# Patient Record
Sex: Male | Born: 1940 | Race: White | Hispanic: No | Marital: Married | Smoking: Former smoker
Health system: Southern US, Community
[De-identification: ages and names within clinical notes are randomized; demographics above are authoritative.]

## PROBLEM LIST (undated history)

## (undated) DIAGNOSIS — R011 Cardiac murmur, unspecified: Secondary | ICD-10-CM

## (undated) DIAGNOSIS — K8020 Calculus of gallbladder without cholecystitis without obstruction: Secondary | ICD-10-CM

## (undated) DIAGNOSIS — C4490 Unspecified malignant neoplasm of skin, unspecified: Secondary | ICD-10-CM

## (undated) DIAGNOSIS — K219 Gastro-esophageal reflux disease without esophagitis: Secondary | ICD-10-CM

## (undated) DIAGNOSIS — I480 Paroxysmal atrial fibrillation: Secondary | ICD-10-CM

## (undated) DIAGNOSIS — I10 Essential (primary) hypertension: Secondary | ICD-10-CM

## (undated) DIAGNOSIS — I639 Cerebral infarction, unspecified: Secondary | ICD-10-CM

## (undated) DIAGNOSIS — M545 Low back pain, unspecified: Secondary | ICD-10-CM

## (undated) DIAGNOSIS — I700 Atherosclerosis of aorta: Secondary | ICD-10-CM

## (undated) DIAGNOSIS — Z7901 Long term (current) use of anticoagulants: Secondary | ICD-10-CM

## (undated) DIAGNOSIS — M79605 Pain in left leg: Secondary | ICD-10-CM

## (undated) DIAGNOSIS — E785 Hyperlipidemia, unspecified: Secondary | ICD-10-CM

## (undated) DIAGNOSIS — Z8673 Personal history of transient ischemic attack (TIA), and cerebral infarction without residual deficits: Secondary | ICD-10-CM

## (undated) DIAGNOSIS — I447 Left bundle-branch block, unspecified: Secondary | ICD-10-CM

## (undated) DIAGNOSIS — M47812 Spondylosis without myelopathy or radiculopathy, cervical region: Secondary | ICD-10-CM

## (undated) DIAGNOSIS — Z8619 Personal history of other infectious and parasitic diseases: Secondary | ICD-10-CM

## (undated) DIAGNOSIS — I209 Angina pectoris, unspecified: Secondary | ICD-10-CM

## (undated) DIAGNOSIS — I5189 Other ill-defined heart diseases: Secondary | ICD-10-CM

## (undated) DIAGNOSIS — I350 Nonrheumatic aortic (valve) stenosis: Secondary | ICD-10-CM

## (undated) HISTORY — DX: Nonrheumatic aortic (valve) stenosis: I35.0

## (undated) HISTORY — DX: Hyperlipidemia, unspecified: E78.5

## (undated) HISTORY — DX: Personal history of other infectious and parasitic diseases: Z86.19

## (undated) HISTORY — DX: Cardiac murmur, unspecified: R01.1

## (undated) HISTORY — DX: Paroxysmal atrial fibrillation: I48.0

## (undated) HISTORY — DX: Essential (primary) hypertension: I10

---
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
## 2007-06-08 DIAGNOSIS — I10 Essential (primary) hypertension: Secondary | ICD-10-CM | POA: Insufficient documentation

## 2008-01-11 DIAGNOSIS — E7800 Pure hypercholesterolemia, unspecified: Secondary | ICD-10-CM | POA: Insufficient documentation

## 2009-03-15 ENCOUNTER — Ambulatory Visit: Payer: Self-pay | Admitting: Unknown Physician Specialty

## 2009-03-15 LAB — HM COLONOSCOPY

## 2011-10-21 ENCOUNTER — Encounter: Payer: Self-pay | Admitting: Cardiovascular Disease

## 2011-10-21 ENCOUNTER — Ambulatory Visit (INDEPENDENT_AMBULATORY_CARE_PROVIDER_SITE_OTHER): Payer: Medicare Other | Admitting: Cardiovascular Disease

## 2011-10-21 VITALS — BP 123/88 | HR 99 | Ht 65.0 in | Wt 187.2 lb

## 2011-10-21 DIAGNOSIS — R55 Syncope and collapse: Secondary | ICD-10-CM | POA: Insufficient documentation

## 2011-10-21 DIAGNOSIS — I10 Essential (primary) hypertension: Secondary | ICD-10-CM | POA: Insufficient documentation

## 2011-10-21 NOTE — Progress Notes (Signed)
Patient ID: Jon Mejia, male    DOB: 11-15-1940, 71 y.o.   MRN: [id number]  HPI Comments: Mr. Albracht is a very pleasant 71 year old gentleman with no prior cardiac history who presents by referral from Dr. Sherrie Mustache for evaluation after syncope.  He reports that he is having dizzy episodes with standing over the past several weeks to months. He had a severe episode 09/27/2011 where he passed out for about a minute. 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 Symptoms persisted and he found himself lying on the ground.   No further episodes since that time. He has significant low blood pressure in the past and cut his lisinopril HCT in half. Daughter who is an EMT reports he was apparently having systolic pressures in the 90s. He is otherwise active, plays golf with no limitations. No shortness of breath with exertion. No chest pain with activity.  Recent lab work including CBC, LFTs, basic metabolic panel, TSH were essentially normal EKG done at an outside office shows normal sinus rhythm with rate 72 beats per minute with no significant ST or T wave changes  Orthostatics done in our office showed an increase in heart rate from 79-99 with standing, blood pressure decreased from 140 to systolic to 123. No symptoms of dizziness   Outpatient Encounter Prescriptions as of 10/21/2011  Medication Sig Dispense Refill  . aspirin 81 MG tablet Take 81 mg by mouth daily.      Marland Kitchen lisinopril-hydrochlorothiazide (PRINZIDE,ZESTORETIC) 10-12.5 MG per tablet Take 1/2 tablet daily      . lovastatin (MEVACOR) 20 MG tablet Take 20 mg by mouth at bedtime.       Review of Systems  Constitutional: Negative.   HENT: Negative.   Eyes: Negative.   Respiratory: Negative.   Cardiovascular: Negative.   Gastrointestinal: Negative.   Musculoskeletal: Negative.   Skin:  Negative.   Neurological: Positive for dizziness and syncope.  Hematological: Negative.   Psychiatric/Behavioral: Negative.   All other systems reviewed and are negative.    BP 123/88  Pulse 99  Ht 5\' 5"  (1.651 m)  Wt 187 lb 4 oz (84.936 kg)  BMI 31.16 kg/m2  Physical Exam  Nursing note and vitals reviewed. Constitutional: He is oriented to person, place, and time. He appears well-developed and well-nourished.  HENT:  Head: Normocephalic.  Nose: Nose normal.  Mouth/Throat: Oropharynx is clear and moist.  Eyes: Conjunctivae normal are normal. Pupils are equal, round, and reactive to light.  Neck: Normal range of motion. Neck supple. No JVD present.  Cardiovascular: Normal rate, regular rhythm, S1 normal, S2 normal, normal heart sounds and intact distal pulses.  Exam reveals no gallop and no friction rub.   No murmur heard. Pulmonary/Chest: Effort normal and breath sounds normal. No respiratory distress. He has no wheezes. He has no rales. He exhibits no tenderness.  Abdominal: Soft. Bowel sounds are normal. He exhibits no distension. There is no tenderness.  Musculoskeletal: Normal range of motion. He exhibits no edema and no tenderness.  Lymphadenopathy:    He has no cervical adenopathy.  Neurological: He is alert and oriented to person, place, and time. Coordination normal.  Skin: Skin is warm and dry. No rash noted. No erythema.  Psychiatric: He has a normal mood and affect. His behavior is normal. Judgment and thought content normal.  Assessment and Plan

## 2011-10-21 NOTE — Assessment & Plan Note (Signed)
Syncope concerning for orthostasis. There appeared to be a positional component in his symptoms. We have suggested before we do any testing that he hold his lisinopril HCT. His numbers do suggest mild orthostasis on today's visit even on one half pill. We've asked him to monitor his blood pressure and symptoms. If he has additional dizziness, we have suggested he aggressively hydrate. I suggested he call our office for additional symptoms. Possible testing includes Holter monitor, additional workup also includes echocardiogram or stress test though he does not have symptoms with exertion.

## 2011-10-21 NOTE — Assessment & Plan Note (Signed)
His daughter reports low blood pressure at baseline. He is mildly orthostatic on today's visit. We have suggested he hold his blood pressure pill, monitor his blood pressure at home and call our office for systolic pressures running in the 140 range. We will start low-dose lisinopril such as 5 mg daily with slow titration upwards, holding the diuretic.

## 2011-10-21 NOTE — Patient Instructions (Addendum)
You are doing well. Please hold the lisinopril HCTZ  Please call the office if you have more episodes of dizziness Please monitor your blood pressure at home and call with the numbers  Please call us if you have new issues that need to be addressed before your next appt.

## 2012-03-14 ENCOUNTER — Other Ambulatory Visit: Payer: Self-pay

## 2012-04-01 ENCOUNTER — Ambulatory Visit: Payer: Self-pay | Admitting: Family Medicine

## 2012-09-02 ENCOUNTER — Other Ambulatory Visit: Payer: Self-pay

## 2012-12-03 ENCOUNTER — Other Ambulatory Visit: Payer: Self-pay

## 2014-01-17 LAB — LIPID PANEL
Cholesterol: 154 mg/dL (ref 0–200)
HDL: 44 mg/dL (ref 35–70)
LDL CALC: 88 mg/dL
Triglycerides: 109 mg/dL (ref 40–160)

## 2014-01-17 LAB — BASIC METABOLIC PANEL
BUN: 12 mg/dL (ref 4–21)
Creatinine: 1.1 mg/dL (ref 0.6–1.3)
GLUCOSE: 107 mg/dL
POTASSIUM: 5.2 mmol/L (ref 3.4–5.3)
SODIUM: 140 mmol/L (ref 137–147)

## 2014-01-17 LAB — PSA: PSA: 2.2

## 2014-01-17 LAB — HEPATIC FUNCTION PANEL: ALT: 19 U/L (ref 10–40)

## 2014-03-15 DIAGNOSIS — M5416 Radiculopathy, lumbar region: Secondary | ICD-10-CM | POA: Diagnosis not present

## 2014-03-22 DIAGNOSIS — M545 Low back pain: Secondary | ICD-10-CM | POA: Diagnosis not present

## 2014-04-04 DIAGNOSIS — M5416 Radiculopathy, lumbar region: Secondary | ICD-10-CM | POA: Diagnosis not present

## 2014-04-06 DIAGNOSIS — M545 Low back pain: Secondary | ICD-10-CM | POA: Diagnosis not present

## 2014-04-08 DIAGNOSIS — M545 Low back pain: Secondary | ICD-10-CM | POA: Diagnosis not present

## 2014-04-12 DIAGNOSIS — M545 Low back pain: Secondary | ICD-10-CM | POA: Diagnosis not present

## 2014-04-15 DIAGNOSIS — M545 Low back pain: Secondary | ICD-10-CM | POA: Diagnosis not present

## 2014-04-18 DIAGNOSIS — M545 Low back pain: Secondary | ICD-10-CM | POA: Diagnosis not present

## 2014-04-20 DIAGNOSIS — M545 Low back pain: Secondary | ICD-10-CM | POA: Diagnosis not present

## 2014-05-06 DIAGNOSIS — M545 Low back pain: Secondary | ICD-10-CM | POA: Diagnosis not present

## 2014-05-09 DIAGNOSIS — M47816 Spondylosis without myelopathy or radiculopathy, lumbar region: Secondary | ICD-10-CM | POA: Diagnosis not present

## 2014-05-26 DIAGNOSIS — M47816 Spondylosis without myelopathy or radiculopathy, lumbar region: Secondary | ICD-10-CM | POA: Diagnosis not present

## 2014-06-22 DIAGNOSIS — M47816 Spondylosis without myelopathy or radiculopathy, lumbar region: Secondary | ICD-10-CM | POA: Diagnosis not present

## 2015-01-01 ENCOUNTER — Other Ambulatory Visit: Payer: Self-pay | Admitting: Family Medicine

## 2015-01-19 ENCOUNTER — Encounter: Payer: Self-pay | Admitting: Family Medicine

## 2015-01-19 DIAGNOSIS — M5410 Radiculopathy, site unspecified: Secondary | ICD-10-CM | POA: Insufficient documentation

## 2015-01-24 ENCOUNTER — Encounter: Payer: Self-pay | Admitting: Family Medicine

## 2015-01-24 ENCOUNTER — Ambulatory Visit (INDEPENDENT_AMBULATORY_CARE_PROVIDER_SITE_OTHER): Payer: Medicare Other | Admitting: Family Medicine

## 2015-01-24 VITALS — BP 130/70 | HR 74 | Temp 98.4°F | Resp 16 | Ht 66.0 in | Wt 197.0 lb

## 2015-01-24 DIAGNOSIS — I10 Essential (primary) hypertension: Secondary | ICD-10-CM

## 2015-01-24 DIAGNOSIS — Z125 Encounter for screening for malignant neoplasm of prostate: Secondary | ICD-10-CM | POA: Diagnosis not present

## 2015-01-24 DIAGNOSIS — Z23 Encounter for immunization: Secondary | ICD-10-CM

## 2015-01-24 DIAGNOSIS — R0609 Other forms of dyspnea: Secondary | ICD-10-CM

## 2015-01-24 DIAGNOSIS — Z0000 Encounter for general adult medical examination without abnormal findings: Secondary | ICD-10-CM | POA: Diagnosis not present

## 2015-01-24 DIAGNOSIS — M5410 Radiculopathy, site unspecified: Secondary | ICD-10-CM

## 2015-01-24 DIAGNOSIS — E7800 Pure hypercholesterolemia, unspecified: Secondary | ICD-10-CM

## 2015-01-24 NOTE — Progress Notes (Signed)
Patient: Jon Mejia, Male    DOB: 04/09/1940, 74 y.o.   MRN: [id number] Visit Date: 01/24/2015  Today's Provider: [name], MD   Chief Complaint  Patient presents with  . Annual Exam  . Hypertension  . Hyperlipidemia  . Back Pain   Subjective:    Annual physical  Jon Mejia is a 74 y.o. male. He feels well. He reports exercising yes/walking. He reports he is sleeping poorly.  -----------------------------------------------------------   Follow-up for back pain from 01/18/2015; restarted Meloxicam 15 mg qd. Is being followed by at [facility] and has had several steroid injections which he has found to help. He continues meloxicam prn which he states is very effective.    ----------------------------------------------------------------------  Hypertension, follow-up:  BP Readings from Last 3 Encounters:  01/24/15 130/70  01/17/14 140/80  10/21/11 123/88    He was last seen for hypertension 1 years ago.  BP at that visit was 140/80. Management since that visit includes; discontinued lisinopril-hctz in 2013 due to dizziness.He reports good compliance with treatment. Has had no problems with dizziness since changing BP medications.  He is not having side effects. none  He is exercising. He is adherent to low salt diet.   Outside blood pressures are 140/80. He is experiencing none.  Patient denies none.   Cardiovascular risk factors include none.  Use of agents associated with hypertension: none.   ------------------------------------------------------------------------   Lipid/Cholesterol, Follow-up:   Last seen for this 1 years ago.  Management since that visit includes; no changes.  Last Lipid Panel:    Component Value Date/Time   CHOL 154 01/17/2014   TRIG 109 01/17/2014   HDL 44 01/17/2014   LDLCALC 88 01/17/2014    He reports good compliance with treatment. He is not having side effects. none  Wt Readings from  Last 3 Encounters:  01/24/15 197 lb (89.359 kg)  01/17/14 194 lb (87.998 kg)  10/21/11 187 lb 4 oz (84.936 kg)    ----------------------------------------------------------------------     Review of Systems  Constitutional: Positive for fatigue.  HENT: Negative.   Eyes: Negative.   Respiratory: Positive for shortness of breath.   Cardiovascular: Negative.   Gastrointestinal: Negative.   Endocrine: Negative.   Musculoskeletal: Positive for back pain.  Skin: Negative.   Allergic/Immunologic: Negative.   Neurological: Negative.   Hematological: Negative.   Psychiatric/Behavioral: Positive for sleep disturbance.       Has trouble staying asleep    Social History   Social History  . Marital Status: Married    Spouse Name: N/A  . Number of Children: N/A  . Years of Education: N/A   Occupational History  . Not on file.   Social History Main Topics  . Smoking status: Former Smoker -- 1.00 packs/day for 20 years    Quit date: 03/12/1983  . Smokeless tobacco: Not on file  . Alcohol Use: No  . Drug Use: No  . Sexual Activity: Not on file   Other Topics Concern  . Not on file   Social History Narrative    Patient Active Problem List   Diagnosis Date Noted  . Back pain with left-sided radiculopathy 01/19/2015  . Essential hypertension, malignant 10/21/2011  . Syncope 10/21/2011  . Pure hypercholesterolemia 01/11/2008  . Essential (primary) hypertension 06/08/2007  . Premature atrial beats 01/05/2007    History reviewed. No pertinent past surgical history.  His family history includes Arrhythmia in his mother. There is no history of  Colon cancer or Prostate cancer.    Previous Medications   AMLODIPINE (NORVASC) 5 MG TABLET    take 1 tablet by mouth once daily   ASPIRIN 81 MG TABLET    Take 81 mg by mouth daily.   LOVASTATIN (MEVACOR) 20 MG TABLET    Take 20 mg by mouth at bedtime.   MELOXICAM (MOBIC) 15 MG TABLET    Take 1 tablet by mouth daily as needed. For  back pain    Patient Care Team: [name], MD as PCP - General (Family Medicine)     Objective:   Vitals: BP 130/70 mmHg  Pulse 74  Temp(Src) 98.4 F (36.9 C) (Oral)  Resp 16  Ht 5\' 6"  (1.676 m)  Wt 197 lb (89.359 kg)  BMI 31.81 kg/m2  SpO2 96%  Physical Exam   General Appearance:    Alert, cooperative, no distress, appears stated age  Head:    Normocephalic, without obvious abnormality, atraumatic  Eyes:    PERRL, conjunctiva/corneas clear, EOM's intact, fundi    benign, both eyes       Ears:    Normal TM's and external ear canals, both ears  Nose:   Nares normal, septum midline, mucosa normal, no drainage   or sinus tenderness  Throat:   Lips, mucosa, and tongue normal; teeth and gums normal  Neck:   Supple, symmetrical, trachea midline, no adenopathy;       thyroid:  No enlargement/tenderness/nodules; no carotid   bruit or JVD  Back:     Symmetric, no curvature, ROM normal, no CVA tenderness  Lungs:     Clear to auscultation bilaterally, respirations unlabored  Chest wall:    No tenderness or deformity  Heart:    Regular rate and rhythm, S1 and S2 normal. III/VI systolic murmur RUSB  Abdomen:     Soft, non-tender, bowel sounds active all four quadrants,    no masses, no organomegaly  Genitalia:    deferred  Rectal:    deferred  Extremities:   Extremities normal, atraumatic, no cyanosis or edema  Pulses:   2+ and symmetric all extremities  Skin:   Skin color, texture, turgor normal, no rashes or lesions  Lymph nodes:   Cervical, supraclavicular, and axillary nodes normal  Neurologic:   CNII-XII intact. Normal strength, sensation and reflexes      throughout    Activities of Daily Living In your present state of health, do you have any difficulty performing the following activities: 01/24/2015  Hearing? N  Vision? N  Difficulty concentrating or making decisions? N  Walking or climbing stairs? N  Dressing or bathing? N  Doing errands, shopping? N     Fall Risk Assessment Fall Risk  01/24/2015  Falls in the past year? No     Depression Screen PHQ 2/9 Scores 01/24/2015  PHQ - 2 Score 0    Cognitive Testing - 6-CIT  Correct? Score   What year is it? yes 0 0 or 4  What month is it? yes 0 0 or 3  Memorize:    Pia Mau,  [birth year],  [place],      What time is it? (within 1 hour) yes 0 0 or 3  Count backwards from 20 yes 0 0, 2, or 4  Name the months of the year yes 0 0, 2, or 4  Repeat name & address above yes 0 0, 2, 4, 6, 8, or 10  TOTAL SCORE  0/28   Interpretation:  Normal  Normal (0-7) Abnormal (8-28)    Audit-C Alcohol Use Screening  Question Answer Points  How often do you have alcoholic drink? never 0  On days you do drink alcohol, how many drinks do you typically consume? n/a 0  How oftey will you drink 6 or more in a total? never 0  Total Score:  0   A score of 3 or more in women, and 4 or more in men indicates increased risk for alcohol abuse, EXCEPT if all of the points are from question 1.     Assessment & Plan:    Yearly physical Reviewed patient's Family Medical History Reviewed and updated list of patient's medical providers Assessment of cognitive impairment was done Assessed patient's functional ability Established a written schedule for health screening [place] Completed and Reviewed  Exercise Activities and Dietary recommendations Goals    None      Immunization History  Administered Date(s) Administered  . Pneumococcal Conjugate-13 07/12/2013  . Pneumococcal Polysaccharide-23 01/07/2008  . Td 12/15/2003    Health Maintenance  Topic Date Due  . ZOSTAVAX  08/16/2002  . TETANUS/TDAP  12/14/2013  . INFLUENZA VACCINE  08/29/2014  . COLONOSCOPY  03/16/2019  . PNA vac Low Risk Adult  Completed      Discussed health benefits of physical activity, and encouraged him to engage in regular exercise appropriate for his age and condition.     --------------------------------------------------------------------------------  1. Annual physical exam   2. Pure hypercholesterolemia He is tolerating lovastatin well with no adverse effects.   - ALT - Lipid panel  3. Back pain with left-sided radiculopathy Fairly well controlled on prn meloxicam and responded well to epidural injections at [facility].   4. Essential (primary) hypertension Fairly well controlled.  Continue current medications.   - EKG 12-Lead - Renal function panel  5. Dyspnea on exertion New symptom. In setting of systolic heart murmur need to evaluation integrity of valces and cardiac function - CBC - Echocardiogram; Future  6. Need for influenza vaccination  - Flu Vaccine QUAD 36+ mos IM  7. Prostate cancer screening  - PSA

## 2015-01-25 LAB — RENAL FUNCTION PANEL
Albumin: 4.4 g/dL (ref 3.5–4.8)
BUN / CREAT RATIO: 15 (ref 10–22)
BUN: 14 mg/dL (ref 8–27)
CO2: 21 mmol/L (ref 18–29)
CREATININE: 0.95 mg/dL (ref 0.76–1.27)
Calcium: 9.3 mg/dL (ref 8.6–10.2)
Chloride: 102 mmol/L (ref 96–106)
GFR, EST AFRICAN AMERICAN: 92 mL/min/{1.73_m2} (ref >59)
GFR, EST NON AFRICAN AMERICAN: 80 mL/min/{1.73_m2} (ref >59)
GLUCOSE: 98 mg/dL (ref 65–99)
PHOSPHORUS: 3 mg/dL (ref 2.5–4.5)
POTASSIUM: 4.7 mmol/L (ref 3.5–5.2)
SODIUM: 141 mmol/L (ref 134–144)

## 2015-01-25 LAB — CBC
HEMATOCRIT: 45.1 % (ref 37.5–51.0)
Hemoglobin: 15 g/dL (ref 12.6–17.7)
MCH: 30.1 pg (ref 26.6–33.0)
MCHC: 33.3 g/dL (ref 31.5–35.7)
MCV: 91 fL (ref 79–97)
PLATELETS: 299 10*3/uL (ref 150–379)
RBC: 4.98 x10E6/uL (ref 4.14–5.80)
RDW: 13.9 % (ref 12.3–15.4)
WBC: 7.1 10*3/uL (ref 3.4–10.8)

## 2015-01-25 LAB — LIPID PANEL
CHOL/HDL RATIO: 3.7 ratio (ref 0.0–5.0)
Cholesterol, Total: 145 mg/dL (ref 100–199)
HDL: 39 mg/dL — ABNORMAL LOW (ref >39)
LDL CALC: 83 mg/dL (ref 0–99)
TRIGLYCERIDES: 116 mg/dL (ref 0–149)
VLDL Cholesterol Cal: 23 mg/dL (ref 5–40)

## 2015-01-25 LAB — ALT: ALT: 15 IU/L (ref 0–44)

## 2015-01-25 LAB — PSA: PROSTATE SPECIFIC AG, SERUM: 2.7 ng/mL (ref 0.0–4.0)

## 2015-01-26 ENCOUNTER — Telehealth: Payer: Self-pay | Admitting: Family Medicine

## 2015-01-26 NOTE — Telephone Encounter (Signed)
Appointment scheduled at [facility] for echo 01/31/15 at 10:00.Auth # [id number] expires 03/12/15

## 2015-01-31 ENCOUNTER — Ambulatory Visit
Admission: RE | Admit: 2015-01-31 | Discharge: 2015-01-31 | Disposition: A | Discharge status: Home / Self Care | Payer: Medicare Other | Source: Ambulatory Visit | Attending: Family Medicine | Admitting: Family Medicine

## 2015-01-31 DIAGNOSIS — R0609 Other forms of dyspnea: Secondary | ICD-10-CM | POA: Diagnosis not present

## 2015-01-31 DIAGNOSIS — R011 Cardiac murmur, unspecified: Secondary | ICD-10-CM | POA: Diagnosis not present

## 2015-01-31 DIAGNOSIS — IMO0001 Reserved for inherently not codable concepts without codable children: Secondary | ICD-10-CM

## 2015-01-31 DIAGNOSIS — I700 Atherosclerosis of aorta: Secondary | ICD-10-CM

## 2015-01-31 NOTE — Progress Notes (Signed)
*  PRELIMINARY RESULTS* Echocardiogram 2D Echocardiogram has been performed.  Jon Mejia 01/31/2015, 10:17 AM

## 2015-02-01 ENCOUNTER — Telehealth: Payer: Self-pay | Admitting: *Deleted

## 2015-02-01 DIAGNOSIS — IMO0001 Reserved for inherently not codable concepts without codable children: Secondary | ICD-10-CM | POA: Insufficient documentation

## 2015-02-01 DIAGNOSIS — I700 Atherosclerosis of aorta: Secondary | ICD-10-CM

## 2015-02-01 NOTE — Telephone Encounter (Signed)
Patient is requesting lab results. Please advise.

## 2015-02-02 NOTE — Telephone Encounter (Signed)
See result notes. 

## 2015-04-11 ENCOUNTER — Other Ambulatory Visit: Payer: Self-pay | Admitting: *Deleted

## 2015-04-11 MED ORDER — LOVASTATIN 20 MG PO TABS: 20.0000 mg | ORAL_TABLET | Freq: Every day | ORAL | Status: DC

## 2015-04-11 NOTE — Telephone Encounter (Signed)
Requesting 90 day supply.

## 2015-11-28 DIAGNOSIS — H2513 Age-related nuclear cataract, bilateral: Secondary | ICD-10-CM | POA: Diagnosis not present

## 2015-11-28 DIAGNOSIS — H01022 Squamous blepharitis right lower eyelid: Secondary | ICD-10-CM | POA: Diagnosis not present

## 2015-11-28 DIAGNOSIS — H01025 Squamous blepharitis left lower eyelid: Secondary | ICD-10-CM | POA: Diagnosis not present

## 2015-11-28 DIAGNOSIS — H524 Presbyopia: Secondary | ICD-10-CM | POA: Diagnosis not present

## 2015-12-08 ENCOUNTER — Telehealth: Payer: Self-pay | Admitting: Family Medicine

## 2015-12-08 NOTE — Telephone Encounter (Signed)
Called Pt to schedule AWV with [facility] - knb °

## 2016-01-25 ENCOUNTER — Encounter: Payer: Self-pay | Admitting: Family Medicine

## 2016-01-25 ENCOUNTER — Ambulatory Visit (INDEPENDENT_AMBULATORY_CARE_PROVIDER_SITE_OTHER): Payer: Medicare Other | Admitting: Family Medicine

## 2016-01-25 VITALS — BP 132/74 | HR 74 | Temp 98.6°F | Resp 16 | Ht 66.0 in | Wt 197.0 lb

## 2016-01-25 DIAGNOSIS — Z125 Encounter for screening for malignant neoplasm of prostate: Secondary | ICD-10-CM

## 2016-01-25 DIAGNOSIS — Z23 Encounter for immunization: Secondary | ICD-10-CM

## 2016-01-25 DIAGNOSIS — E7800 Pure hypercholesterolemia, unspecified: Secondary | ICD-10-CM | POA: Diagnosis not present

## 2016-01-25 DIAGNOSIS — Z0000 Encounter for general adult medical examination without abnormal findings: Secondary | ICD-10-CM | POA: Diagnosis not present

## 2016-01-25 DIAGNOSIS — I10 Essential (primary) hypertension: Secondary | ICD-10-CM | POA: Diagnosis not present

## 2016-01-25 NOTE — Progress Notes (Addendum)
Patient: Jon Mejia, Male    DOB: 1940-09-14, 75 y.o.   MRN: [id number] Visit Date: 01/25/2016  Today's Provider: [name], MD   Chief Complaint  Patient presents with  . Annual Exam  . Hypertension  . Hyperlipidemia   Subjective:    Annual physical Jon Mejia is a 75 y.o. male. He feels well. He reports exercising occasionally. He reports he is sleeping well.  Colonoscopy- 03/15/2009. Internal hemorrhoids.  Td- 12/15/2003.    Hypertension, follow-up:  BP Readings from Last 3 Encounters:  01/25/16 132/74  01/24/15 130/70  01/17/14 140/80    He was last seen for hypertension 1 years ago.  BP at that visit was 130/70. Management since that visit includes no changes. He reports good compliance with treatment. He is not having side effects.  He is exercising. He is adherent to low salt diet.   Outside blood pressures are checked occasionally by his daughter. Patient denies chest pressure/discomfort, lower extremity edema, syncope and tachypnea.   Cardiovascular risk factors include dyslipidemia.     Weight trend: stable Wt Readings from Last 3 Encounters:  01/25/16 197 lb (89.4 kg)  01/24/15 197 lb (89.4 kg)  01/17/14 194 lb (88 kg)    Current diet: well balanced     Lipid/Cholesterol, Follow-up:   Last seen for this1 years ago.  Management changes since that visit include no changes. . Last Lipid Panel:    Component Value Date/Time   CHOL 145 01/24/2015 1041   TRIG 116 01/24/2015 1041   HDL 39 (L) 01/24/2015 1041   CHOLHDL 3.7 01/24/2015 1041   [place] 83 01/24/2015 1041    Risk factors for vascular disease include hypertension  He reports good compliance with treatment. He is not having side effects.  Current symptoms include none and have been stable. Weight trend: stable Prior visit with dietician: no Current diet: well balanced Current exercise: walking  Wt Readings from Last 3 Encounters:  01/25/16 197 lb (89.4  kg)  01/24/15 197 lb (89.4 kg)  01/17/14 194 lb (88 kg)    Review of Systems  Constitutional: Negative.   HENT: Negative.   Eyes: Negative.   Respiratory: Negative.   Cardiovascular: Negative.   Gastrointestinal: Negative.   Endocrine: Negative.   Genitourinary: Negative.   Musculoskeletal: Negative.   Skin: Negative.   Allergic/Immunologic: Negative.   Neurological: Negative.   Hematological: Negative.   Psychiatric/Behavioral: Negative.     Social History   Social History  . Marital status: Married    Spouse name: N/A  . Number of children: N/A  . Years of education: N/A   Occupational History  . Retired    Social History Main Topics  . Smoking status: Former Smoker    Packs/day: 1.00    Years: 20.00    Quit date: 03/12/1983  . Smokeless tobacco: Not on file  . Alcohol use No  . Drug use: No  . Sexual activity: Not on file   Other Topics Concern  . Not on file   Social History Narrative  . No narrative on file    Past Medical History:  Diagnosis Date  . History of chicken pox   . History of measles      Patient Active Problem List   Diagnosis Date Noted  . 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 Back pain with left-sided radiculopathy 01/19/2015  . Syncope 10/21/2011  . Pure hypercholesterolemia 01/11/2008  . Essential (primary) hypertension 06/08/2007  .  Premature atrial beats 01/05/2007    No past surgical history on file.  His family history includes Arrhythmia in his mother.      Current Outpatient Prescriptions:  .  amLODipine (NORVASC) 5 MG tablet, take 1 tablet by mouth once daily, Disp: 30 tablet, Rfl: 12 .  aspirin 81 MG tablet, Take 81 mg by mouth daily., Disp: , Rfl:  .  lovastatin (MEVACOR) 20 MG tablet, Take 1 tablet (20 mg total) by mouth at bedtime., Disp: 90 tablet, Rfl: 4 .  meloxicam (MOBIC) 15 MG tablet, Take 1 tablet by mouth daily as needed. For back pain, Disp: , Rfl:   Patient Care Team: [name], MD as PCP  - General (Family Medicine)     Objective:   Vitals: BP 132/74 (BP Location: Right Arm, Patient Position: Sitting, Cuff Size: Normal)   Pulse 74   Temp 98.6 F (37 C)   Resp 16   Ht 5\' 6"  (1.676 m)   Wt 197 lb (89.4 kg)   SpO2 96%   BMI 31.80 kg/m   Physical Exam   General Appearance:    Alert, cooperative, no distress, appears stated age, mildly obese  Head:    Normocephalic, without obvious abnormality, atraumatic  Eyes:    PERRL, conjunctiva/corneas clear, EOM's intact, fundi    benign, both eyes       Ears:    Normal TM's and external ear canals, both ears  Nose:   Nares normal, septum midline, mucosa normal, no drainage   or sinus tenderness  Throat:   Lips, mucosa, and tongue normal; teeth and gums normal  Neck:   Supple, symmetrical, trachea midline, no adenopathy;       thyroid:  No enlargement/tenderness/nodules; no carotid   bruit or JVD  Back:     Symmetric, no curvature, ROM normal, no CVA tenderness  Lungs:     Clear to auscultation bilaterally, respirations unlabored  Chest wall:    No tenderness or deformity  Heart:    II/VI systolic murmur. Regular rate and rhythm, S1 and S2 normal,    or gallop  Abdomen:     Soft, non-tender, bowel sounds active all four quadrants,    no masses, no organomegaly  Genitalia:    deferred  Rectal:    deferred  Extremities:   Extremities normal, atraumatic, no cyanosis or edema  Pulses:   2+ and symmetric all extremities  Skin:   Skin color, texture, turgor normal, no rashes or lesions  Lymph nodes:   Cervical, supraclavicular, and axillary nodes normal  Neurologic:   CNII-XII intact. Normal strength, sensation and reflexes      throughout     Activities of Daily Living In your present state of health, do you have any difficulty performing the following activities: 01/25/2016  Hearing? N  Vision? N  Difficulty concentrating or making decisions? N  Walking or climbing stairs? N  Dressing or bathing? N  Doing errands,  shopping? N  Some recent data might be hidden    Fall Risk Assessment Fall Risk  01/25/2016 01/24/2015  Falls in the past year? No No     Depression Screen PHQ 2/9 Scores 01/25/2016 01/24/2015  PHQ - 2 Score 0 0    Cognitive Testing - 6-CIT  Correct? Score   What year is it? yes 0 0 or 4  What month is it? yes 0 0 or 3  Memorize:    Pia Mau,  [address],  [place],  What time is it? (within 1 hour) yes 0 0 or 3  Count backwards from 20 yes 0 0, 2, or 4  Name the months of the year yes 0 0, 2, or 4  Repeat name & address above yes 0 0, 2, 4, 6, 8, or 10       TOTAL SCORE  0/28   Interpretation:  Normal  Normal (0-7) Abnormal (8-28)       Assessment & Plan:     Annual Physical  Reviewed patient's Family Medical History Reviewed and updated list of patient's medical providers Assessment of cognitive impairment was done Assessed patient's functional ability Established a written schedule for health screening [place] Completed and Reviewed  Exercise Activities and Dietary recommendations Goals    None      Immunization History  Administered Date(s) Administered  . Influenza,inj,Quad PF,36+ Mos 01/24/2015  . Pneumococcal Conjugate-13 07/12/2013  . Pneumococcal Polysaccharide-23 01/07/2008  . Td 12/15/2003    Health Maintenance  Topic Date Due  . ZOSTAVAX  08/16/2002  . TETANUS/TDAP  12/14/2013  . INFLUENZA VACCINE  08/29/2015  . COLONOSCOPY  03/16/2019  . PNA vac Low Risk Adult  Completed     Discussed health benefits of physical activity, and encouraged him to engage in regular exercise appropriate for his age and condition.    1. Annual physical exam  2. Need for influenza vaccination  - Flu vaccine HIGH DOSE PF (Fluzone High dose)  3. Essential (primary) hypertension Well controlled.  Continue current medications.   - Renal function panel  4. Pure hypercholesterolemia He is tolerating lovastatin well with  no adverse effects.   - Lipid panel - Hepatic function panel - CBC  5. Prostate cancer screening  - PSA    [name], MD  [facility]

## 2016-01-26 ENCOUNTER — Encounter: Payer: Self-pay | Admitting: Family Medicine

## 2016-01-26 DIAGNOSIS — R739 Hyperglycemia, unspecified: Secondary | ICD-10-CM | POA: Insufficient documentation

## 2016-01-26 LAB — LIPID PANEL
CHOLESTEROL TOTAL: 161 mg/dL (ref 100–199)
Chol/HDL Ratio: 4 ratio units (ref 0.0–5.0)
HDL: 40 mg/dL (ref >39)
LDL Calculated: 104 mg/dL — ABNORMAL HIGH (ref 0–99)
Triglycerides: 85 mg/dL (ref 0–149)
VLDL Cholesterol Cal: 17 mg/dL (ref 5–40)

## 2016-01-26 LAB — RENAL FUNCTION PANEL
Albumin: 4.6 g/dL (ref 3.5–4.8)
BUN/Creatinine Ratio: 14 (ref 10–24)
BUN: 14 mg/dL (ref 8–27)
CALCIUM: 9.3 mg/dL (ref 8.6–10.2)
CHLORIDE: 105 mmol/L (ref 96–106)
CO2: 21 mmol/L (ref 18–29)
Creatinine, Ser: 1.02 mg/dL (ref 0.76–1.27)
GFR calc non Af Amer: 73 mL/min/{1.73_m2} (ref >59)
GFR, EST AFRICAN AMERICAN: 84 mL/min/{1.73_m2} (ref >59)
GLUCOSE: 115 mg/dL — AB (ref 65–99)
PHOSPHORUS: 3.1 mg/dL (ref 2.5–4.5)
POTASSIUM: 4.6 mmol/L (ref 3.5–5.2)
Sodium: 144 mmol/L (ref 134–144)

## 2016-01-26 LAB — CBC
HEMATOCRIT: 43.7 % (ref 37.5–51.0)
HEMOGLOBIN: 14.9 g/dL (ref 13.0–17.7)
MCH: 29.6 pg (ref 26.6–33.0)
MCHC: 34.1 g/dL (ref 31.5–35.7)
MCV: 87 fL (ref 79–97)
Platelets: 295 10*3/uL (ref 150–379)
RBC: 5.03 x10E6/uL (ref 4.14–5.80)
RDW: 14.3 % (ref 12.3–15.4)
WBC: 6.4 10*3/uL (ref 3.4–10.8)

## 2016-01-26 LAB — HEPATIC FUNCTION PANEL
ALT: 18 IU/L (ref 0–44)
AST: 24 IU/L (ref 0–40)
Alkaline Phosphatase: 77 IU/L (ref 39–117)
BILIRUBIN TOTAL: 0.5 mg/dL (ref 0.0–1.2)
BILIRUBIN, DIRECT: 0.15 mg/dL (ref 0.00–0.40)
Total Protein: 7.3 g/dL (ref 6.0–8.5)

## 2016-01-26 LAB — PSA: PROSTATE SPECIFIC AG, SERUM: 2.3 ng/mL (ref 0.0–4.0)

## 2016-03-17 ENCOUNTER — Other Ambulatory Visit: Payer: Self-pay | Admitting: Family Medicine

## 2016-07-17 ENCOUNTER — Encounter: Payer: Self-pay | Admitting: Family Medicine

## 2016-07-17 ENCOUNTER — Ambulatory Visit (INDEPENDENT_AMBULATORY_CARE_PROVIDER_SITE_OTHER): Payer: Medicare Other | Admitting: Family Medicine

## 2016-07-17 VITALS — BP 130/88 | HR 76 | Temp 98.0°F | Resp 16 | Ht 66.0 in | Wt 192.0 lb

## 2016-07-17 DIAGNOSIS — R7303 Prediabetes: Secondary | ICD-10-CM | POA: Diagnosis not present

## 2016-07-17 DIAGNOSIS — I10 Essential (primary) hypertension: Secondary | ICD-10-CM

## 2016-07-17 LAB — POCT GLYCOSYLATED HEMOGLOBIN (HGB A1C)
ESTIMATED AVERAGE GLUCOSE: 126
HEMOGLOBIN A1C: 6

## 2016-07-17 NOTE — Patient Instructions (Addendum)
It is recommended to engage in 150 minutes of moderate exercise every week.     Prediabetes Prediabetes is the condition of having a blood sugar (blood glucose) level that is higher than it should be, but not high enough for you to be diagnosed with type 2 diabetes. Having prediabetes puts you at risk for developing type 2 diabetes (type 2 diabetes mellitus). Prediabetes may be called impaired glucose tolerance or impaired fasting glucose. Prediabetes usually does not cause symptoms. Your health care provider can diagnose this condition with blood tests. You may be tested for prediabetes if you are overweight and if you have at least one other risk factor for prediabetes. Risk factors for prediabetes include:  Having a family member with type 2 diabetes.  Being overweight or obese.  Being older than age 65.  Being of American-Indian, African-American, Hispanic/Latino, or Asian/Pacific Islander descent.  Having an inactive (sedentary) lifestyle.  Having a history of gestational diabetes or polycystic ovarian syndrome (PCOS).  Having low levels of good cholesterol (HDL-C) or high levels of blood fats (triglycerides).  Having high blood pressure.  What is blood glucose and how is blood glucose measured?  Blood glucose refers to the amount of glucose in your bloodstream. Glucose comes from eating foods that contain sugars and starches (carbohydrates) that the body breaks down into glucose. Your blood glucose level may be measured in mg/dL (milligrams per deciliter) or mmol/L (millimoles per liter).Your blood glucose may be checked with one or more of the following blood tests:  A fasting blood glucose (FBG) test. You will not be allowed to eat (you will fast) for at least 8 hours before a blood sample is taken. ? A normal range for FBG is 70-100 mg/dl (3.9-5.6 mmol/L).  An A1c (hemoglobin A1c) blood test. This test provides information about blood glucose control over the previous  2?29months.  An oral glucose tolerance test (OGTT). This test measures your blood glucose twice: ? After fasting. This is your baseline level. ? Two hours after you drink a beverage that contains glucose.  You may be diagnosed with prediabetes:  If your FBG is 100?125 mg/dL (5.6-6.9 mmol/L).  If your A1c level is 5.7?6.4%.  If your OGGT result is 140?199 mg/dL (7.8-11 mmol/L).  These blood tests may be repeated to confirm your diagnosis. What happens if blood glucose is too high? The pancreas produces a hormone (insulin) that helps move glucose from the bloodstream into cells. When cells in the body do not respond properly to insulin that the body makes (insulin resistance), excess glucose builds up in the blood instead of going into cells. As a result, high blood glucose (hyperglycemia) can develop, which can cause many complications. This is a symptom of prediabetes. What can happen if blood glucose stays higher than normal for a long time? Having high blood glucose for a long time is dangerous. Too much glucose in your blood can damage your nerves and blood vessels. Long-term damage can lead to complications from diabetes, which may include:  Heart disease.  Stroke.  Blindness.  Kidney disease.  Depression.  Poor circulation in the feet and legs, which could lead to surgical removal (amputation) in severe cases.  How can prediabetes be prevented from turning into type 2 diabetes?  To help prevent type 2 diabetes, take the following actions:  Be physically active. ? Do moderate-intensity physical activity for at least 30 minutes on at least 5 days of the week, or as much as told by your health care  provider. This could be brisk walking, biking, or water aerobics. ? Ask your health care provider what activities are safe for you. A mix of physical activities may be best, such as walking, swimming, cycling, and strength training.  Lose weight as told by your health care  provider. ? Losing 5-7% of your body weight can reverse insulin resistance. ? Your health care provider can determine how much weight loss is best for you and can help you lose weight safely.  Follow a healthy meal plan. This includes eating lean proteins, complex carbohydrates, fresh fruits and vegetables, low-fat dairy products, and healthy fats. ? Follow instructions from your health care provider about eating or drinking restrictions. ? Make an appointment to see a diet and nutrition specialist (registered dietitian) to help you create a healthy eating plan that is right for you.  Do not smoke or use any tobacco products, such as cigarettes, chewing tobacco, and e-cigarettes. If you need help quitting, ask your health care provider.  Take over-the-counter and prescription medicines as told by your health care provider. You may be prescribed medicines that help lower the risk of type 2 diabetes.  This information is not intended to replace advice given to you by your health care provider. Make sure you discuss any questions you have with your health care provider. Document Released: 05/08/2015 Document Revised: 06/22/2015 Document Reviewed: 03/07/2015 Elsevier Interactive Patient Education  [employer].

## 2016-07-17 NOTE — Progress Notes (Signed)
Patient: Jon Mejia Male    DOB: 1940/05/01   76 y.o.   MRN: [id number] Visit Date: 07/17/2016  Today's Provider: [name], MD   Chief Complaint  Patient presents with  . Follow-up   Subjective:    HPI  Hyperglycemia  From 01/25/2016-labs checked. Blood sugars elevated at 115. Recommended healthy diet and exercise. He has cut out sweet tea from his diet and avoids sugary foods. He tries to exercise by walking a few days each week.     Lab Results  Component Value Date   GLUCOSE 115 (H) 01/25/2016   GLUCOSE 98 01/24/2015    Wt Readings from Last 3 Encounters:  07/17/16 192 lb (87.1 kg)  01/25/16 197 lb (89.4 kg)  01/24/15 197 lb (89.4 kg)     Hypertension, follow-up:  BP Readings from Last 3 Encounters:  07/17/16 130/88  01/25/16 132/74  01/24/15 130/70     He reports good compliance with treatment. He is not having side effects.  He is exercising. He is adherent to low salt diet.   Outside blood pressures are normal. He is experiencing none.  Patient denies chest pain, chest pressure/discomfort and claudication.       Weight trend: stable Wt Readings from Last 3 Encounters:  07/17/16 192 lb (87.1 kg)  01/25/16 197 lb (89.4 kg)  01/24/15 197 lb (89.4 kg)    Current diet: well balanced  ------------------------------------------------------------------------     No Known Allergies   Current Outpatient Prescriptions:  .  amLODipine (NORVASC) 5 MG tablet, take 1 tablet by mouth once daily, Disp: 30 tablet, Rfl: 12 .  aspirin 81 MG tablet, Take 81 mg by mouth daily., Disp: , Rfl:  .  lovastatin (MEVACOR) 20 MG tablet, Take 1 tablet (20 mg total) by mouth at bedtime., Disp: 90 tablet, Rfl: 4 .  meloxicam (MOBIC) 15 MG tablet, Take 1 tablet by mouth daily as needed. For back pain, Disp: , Rfl:   Review of Systems  Constitutional: Negative for appetite change, chills and fever.  Respiratory: Negative for chest tightness, shortness  of breath and wheezing.   Cardiovascular: Negative for chest pain and palpitations.  Gastrointestinal: Negative for abdominal pain, nausea and vomiting.    Social History  Substance Use Topics  . Smoking status: Former Smoker    Packs/day: 1.00    Years: 20.00    Quit date: 03/12/1983  . Smokeless tobacco: Never Used  . Alcohol use No   Objective:   BP (!) 150/80 (BP Location: Right Arm, Patient Position: Sitting, Cuff Size: Normal)   Pulse 76   Temp 98 F (36.7 C) (Oral)   Resp 16   Ht 5\' 6"  (1.676 m)   Wt 192 lb (87.1 kg)   SpO2 94%   BMI 30.99 kg/m  Vitals:   07/17/16 0815  BP: (!) 150/80  Pulse: 76  Resp: 16  Temp: 98 F (36.7 C)  TempSrc: Oral  SpO2: 94%  Weight: 192 lb (87.1 kg)  Height: 5\' 6"  (1.676 m)     Physical Exam   General Appearance:    Alert, cooperative, no distress  Eyes:    PERRL, conjunctiva/corneas clear, EOM's intact       Lungs:     Clear to auscultation bilaterally, respirations unlabored  Heart:    Regular rate and rhythm  Neurologic:   Awake, alert, oriented x 3. No apparent focal neurological           defect.  Results for orders placed or performed in visit on 07/17/16  POCT glycosylated hemoglobin (Hb A1C)  Result Value Ref Range   Hemoglobin A1C 6.0    Est. average glucose Bld gHb Est-mCnc 126        Assessment & Plan:     .1. Prediabetes counseled on diet and exercise. Check a1c every six months. 2. - POCT glycosylated hemoglobin (Hb A1C)  2. Essential (primary) hypertension Well controlled.  Continue current medications.         [name], MD  [facility]

## 2016-07-25 ENCOUNTER — Ambulatory Visit: Payer: Self-pay | Admitting: Family Medicine

## 2016-12-20 ENCOUNTER — Other Ambulatory Visit: Payer: Self-pay | Admitting: Family Medicine

## 2017-01-07 DIAGNOSIS — H2513 Age-related nuclear cataract, bilateral: Secondary | ICD-10-CM | POA: Diagnosis not present

## 2017-01-07 DIAGNOSIS — H524 Presbyopia: Secondary | ICD-10-CM | POA: Diagnosis not present

## 2017-01-09 ENCOUNTER — Ambulatory Visit (INDEPENDENT_AMBULATORY_CARE_PROVIDER_SITE_OTHER): Payer: Medicare Other | Admitting: Family Medicine

## 2017-01-09 ENCOUNTER — Ambulatory Visit
Admission: RE | Admit: 2017-01-09 | Discharge: 2017-01-09 | Disposition: A | Discharge status: Home / Self Care | Payer: Medicare Other | Source: Ambulatory Visit | Attending: Family Medicine | Admitting: Family Medicine

## 2017-01-09 ENCOUNTER — Encounter: Payer: Self-pay | Admitting: Family Medicine

## 2017-01-09 VITALS — BP 138/86 | HR 72 | Temp 97.9°F | Resp 16 | Wt 201.0 lb

## 2017-01-09 DIAGNOSIS — M19032 Primary osteoarthritis, left wrist: Secondary | ICD-10-CM | POA: Insufficient documentation

## 2017-01-09 DIAGNOSIS — Z23 Encounter for immunization: Secondary | ICD-10-CM

## 2017-01-09 DIAGNOSIS — E7800 Pure hypercholesterolemia, unspecified: Secondary | ICD-10-CM | POA: Diagnosis not present

## 2017-01-09 DIAGNOSIS — R7303 Prediabetes: Secondary | ICD-10-CM | POA: Diagnosis not present

## 2017-01-09 DIAGNOSIS — M19042 Primary osteoarthritis, left hand: Secondary | ICD-10-CM | POA: Diagnosis not present

## 2017-01-09 DIAGNOSIS — Z125 Encounter for screening for malignant neoplasm of prostate: Secondary | ICD-10-CM | POA: Diagnosis not present

## 2017-01-09 DIAGNOSIS — I10 Essential (primary) hypertension: Secondary | ICD-10-CM | POA: Diagnosis not present

## 2017-01-09 DIAGNOSIS — M25542 Pain in joints of left hand: Secondary | ICD-10-CM | POA: Diagnosis not present

## 2017-01-09 DIAGNOSIS — M79642 Pain in left hand: Secondary | ICD-10-CM | POA: Diagnosis not present

## 2017-01-09 NOTE — Progress Notes (Signed)
Patient: Jon Mejia Male    DOB: 11-21-1940   76 y.o.   MRN: [id number] Visit Date: 01/09/2017  Today's Provider: [name], MD   Chief Complaint  Patient presents with  . Hand Pain   Subjective:    Patient has pain and swelling in left hand at the joint. Patient states he has had pain in joint for awhile. Pain has progressed until it is almost intolerable. Patient has been taking meloxicam.     Hypertension, follow-up:  BP Readings from Last 3 Encounters:  01/09/17 138/86  07/17/16 130/88  01/25/16 132/74    He was last seen for hypertension 6 months ago.  BP at that visit was well controlled. Management since that visit included continue same medications. Marland Kitchen He reports excellent compliance with treatment. He is not having side effects.   He is adherent to low salt diet.   Outside blood pressures are normal. .  Weight trend: stable Wt Readings from Last 3 Encounters:  01/09/17 201 lb (91.2 kg)  07/17/16 192 lb (87.1 kg)  01/25/16 197 lb (89.4 kg)    Current diet: in general, a "healthy" diet    ------------------------------------------------------------------------   .  Lipid/Cholesterol, Follow-up:   Last seen for this1 years ago.  Management changes since that visit include continuing same dose of lovastatin. . Last Lipid Panel:    Component Value Date/Time   CHOL 161 01/25/2016 0940   TRIG 85 01/25/2016 0940   HDL 40 01/25/2016 0940   CHOLHDL 4.0 01/25/2016 0940   LDLCALC 104 (H) 01/25/2016 0940     He reports good compliance with treatment. He is not having side effects.   Weight trend: stable   Wt Readings from Last 3 Encounters:  01/09/17 201 lb (91.2 kg)  07/17/16 192 lb (87.1 kg)  01/25/16 197 lb (89.4 kg)    -------------------------------------------------------------------    No Known Allergies   Current Outpatient Medications:  .  amLODipine (NORVASC) 5 MG tablet, take 1 tablet by mouth once daily, Disp:  30 tablet, Rfl: 12 .  aspirin 81 MG tablet, Take 81 mg by mouth daily., Disp: , Rfl:  .  lovastatin (MEVACOR) 20 MG tablet, Take 1 tablet (20 mg total) by mouth at bedtime., Disp: 90 tablet, Rfl: 4 .  meloxicam (MOBIC) 15 MG tablet, take 1 tablet by mouth once daily after meals if needed for BACK PAIN, Disp: 30 tablet, Rfl: 2  Review of Systems  Constitutional: Negative for appetite change, chills and fever.  Respiratory: Negative for chest tightness, shortness of breath and wheezing.   Cardiovascular: Negative for chest pain and palpitations.  Gastrointestinal: Negative for abdominal pain, nausea and vomiting.  Musculoskeletal: Positive for arthralgias and joint swelling.    Social History   Tobacco Use  . Smoking status: Former Smoker    Packs/day: 1.00    Years: 20.00    Pack years: 20.00    Last attempt to quit: 03/12/1983    Years since quitting: 33.8  . Smokeless tobacco: Never Used  Substance Use Topics  . Alcohol use: No   Objective:   BP (!) 150/100 (BP Location: Right Arm, Patient Position: Sitting, Cuff Size: Normal)   Pulse 72   Temp 97.9 F (36.6 C) (Oral)   Resp 16   Wt 201 lb (91.2 kg)   SpO2 96%   BMI 32.44 kg/m  Vitals:   01/09/17 0823  BP: (!) 150/100  Pulse: 72  Resp: 16  Temp: 97.9  F (36.6 C)  TempSrc: Oral  SpO2: 96%  Weight: 201 lb (91.2 kg)     Physical Exam  General Appearance:    Alert, cooperative, no distress  HENT:   ENT exam normal, no neck nodes or sinus tenderness  Eyes:    PERRL, conjunctiva/corneas clear, EOM's intact       Lungs:     Clear to auscultation bilaterally, respirations unlabored  Heart:    Regular rate and rhythm  Neurologic:   Awake, alert, oriented x 3. No apparent focal neurological           defect.   MS:   Mild swelling of MCPs left hand worse at second digit. No erythema. Tender to touch pain with passive        Assessment & Plan:     1. Arthralgia of left hand  - DG Hand Complete Left; Future - Uric  acid - Rheumatoid Factor  2. Essential (primary) hypertension Continue current medications.    3. Prediabetes  - Hemoglobin A1c  4. Need for influenza vaccination  - Flu vaccine HIGH DOSE PF  5. Pure hypercholesterolemia He is tolerating lovastatin well with no adverse effects.   - Lipid panel - COMPLETE METABOLIC PANEL WITH GFR  6. Prostate cancer screening  - PSA       [name], MD  [facility]

## 2017-01-10 ENCOUNTER — Telehealth: Payer: Self-pay

## 2017-01-10 LAB — HEMOGLOBIN A1C
HEMOGLOBIN A1C: 5.9 %{Hb} — AB (ref <5.7)
MEAN PLASMA GLUCOSE: 123 (calc)
eAG (mmol/L): 6.8 (calc)

## 2017-01-10 LAB — COMPLETE METABOLIC PANEL WITH GFR
AG Ratio: 1.7 (calc) (ref 1.0–2.5)
ALBUMIN MSPROF: 4.3 g/dL (ref 3.6–5.1)
ALKALINE PHOSPHATASE (APISO): 62 U/L (ref 40–115)
ALT: 17 U/L (ref 9–46)
AST: 23 U/L (ref 10–35)
BILIRUBIN TOTAL: 0.6 mg/dL (ref 0.2–1.2)
BUN: 14 mg/dL (ref 7–25)
CALCIUM: 9.3 mg/dL (ref 8.6–10.3)
CHLORIDE: 105 mmol/L (ref 98–110)
CO2: 25 mmol/L (ref 20–32)
CREATININE: 1.1 mg/dL (ref 0.70–1.18)
GFR, Est African American: 76 mL/min/{1.73_m2} (ref >=60)
GFR, Est Non African American: 66 mL/min/{1.73_m2} (ref >=60)
GLUCOSE: 111 mg/dL — AB (ref 65–99)
Globulin: 2.6 g/dL (calc) (ref 1.9–3.7)
Potassium: 4.3 mmol/L (ref 3.5–5.3)
Sodium: 138 mmol/L (ref 135–146)
TOTAL PROTEIN: 6.9 g/dL (ref 6.1–8.1)

## 2017-01-10 LAB — LIPID PANEL
Cholesterol: 185 mg/dL (ref <200)
HDL: 45 mg/dL (ref >40)
LDL Cholesterol (Calc): 119 mg/dL (calc) — ABNORMAL HIGH
Non-HDL Cholesterol (Calc): 140 mg/dL (calc) — ABNORMAL HIGH (ref <130)
TRIGLYCERIDES: 99 mg/dL (ref <150)
Total CHOL/HDL Ratio: 4.1 (calc) (ref <5.0)

## 2017-01-10 LAB — URIC ACID: URIC ACID, SERUM: 6.8 mg/dL (ref 4.0–8.0)

## 2017-01-10 LAB — PSA: PSA: 2.2 ng/mL (ref <=4.0)

## 2017-01-10 LAB — RHEUMATOID FACTOR: Rhuematoid fact SerPl-aCnc: <14 IU/mL (ref <14)

## 2017-01-10 MED ORDER — DICLOFENAC SODIUM 1 % TD GEL: 2.0000 g | Freq: Four times a day (QID) | TRANSDERMAL | 2 refills | Status: DC

## 2017-01-10 MED ORDER — LOVASTATIN 40 MG PO TABS: 40.0000 mg | ORAL_TABLET | Freq: Every day | ORAL | 5 refills | Status: DC

## 2017-01-10 NOTE — Telephone Encounter (Signed)
-----   Message from [name], MD sent at 01/10/2017  7:45 AM EST ----- xrays of hands showed significant arthritis, otherwise no sign of any inflammatory joint disease. Recommend voltaren 1% gel, apply 1 gram to affected area four times daily as needed. 100grams, 2 refill. Call if not effective.   Also, LDL cholesterol is 119 but should be under 100. Increase lovastatin to 40mg  daily, #30, rf x5.   Rest of labs are normal, check yearly.

## 2017-01-10 NOTE — Telephone Encounter (Signed)
Patient was notified of results. Rx's sent to pharmacy.

## 2017-01-29 ENCOUNTER — Encounter: Payer: Self-pay | Admitting: Family Medicine

## 2017-04-28 ENCOUNTER — Other Ambulatory Visit: Payer: Self-pay | Admitting: Family Medicine

## 2017-04-28 MED ORDER — AMLODIPINE BESYLATE 5 MG PO TABS: 5.0000 mg | ORAL_TABLET | Freq: Every day | ORAL | 12 refills | Status: DC

## 2017-04-28 NOTE — Telephone Encounter (Signed)
[facility] pharmacy faxed a refill request for the following medication. Thanks CC  amLODipine (NORVASC) 5 MG tablet

## 2017-04-29 ENCOUNTER — Encounter: Payer: Self-pay | Admitting: Family Medicine

## 2017-04-29 ENCOUNTER — Ambulatory Visit (INDEPENDENT_AMBULATORY_CARE_PROVIDER_SITE_OTHER): Payer: Medicare Other

## 2017-04-29 ENCOUNTER — Ambulatory Visit (INDEPENDENT_AMBULATORY_CARE_PROVIDER_SITE_OTHER): Payer: Medicare Other | Admitting: Family Medicine

## 2017-04-29 VITALS — BP 148/80

## 2017-04-29 VITALS — BP 146/72 | HR 68 | Temp 98.0°F | Ht 66.0 in | Wt 198.0 lb

## 2017-04-29 DIAGNOSIS — Z125 Encounter for screening for malignant neoplasm of prostate: Secondary | ICD-10-CM

## 2017-04-29 DIAGNOSIS — Z6831 Body mass index (BMI) 31.0-31.9, adult: Secondary | ICD-10-CM | POA: Diagnosis not present

## 2017-04-29 DIAGNOSIS — I10 Essential (primary) hypertension: Secondary | ICD-10-CM | POA: Diagnosis not present

## 2017-04-29 DIAGNOSIS — Z0000 Encounter for general adult medical examination without abnormal findings: Secondary | ICD-10-CM | POA: Diagnosis not present

## 2017-04-29 DIAGNOSIS — E7800 Pure hypercholesterolemia, unspecified: Secondary | ICD-10-CM

## 2017-04-29 DIAGNOSIS — R739 Hyperglycemia, unspecified: Secondary | ICD-10-CM

## 2017-04-29 NOTE — Patient Instructions (Signed)
Jon Mejia , Thank you for taking time to come for your Medicare Wellness Visit. I appreciate your ongoing commitment to your health goals. Please review the following plan we discussed and let me know if I can assist you in the future.   Screening recommendations/referrals: Colonoscopy: Up to date Recommended yearly ophthalmology/optometry visit for glaucoma screening and checkup Recommended yearly dental visit for hygiene and checkup  Vaccinations: Influenza vaccine: Up to date Pneumococcal vaccine: Up to date Tdap vaccine: Pt states he is up to date, will contact pharmacy and update HM. Shingles vaccine: Pt declines today.     Advanced directives: Please bring a copy of your POA (Power of Attorney) and/or Living Will to your next appointment.   Conditions/risks identified: Obesity- recommend to walk 3 days a week for 30 minutes at a time without stopping.   Next appointment: 9:00 AM with Dr Caryn Section.   Preventive Care 75 Years and Older, Male Preventive care refers to lifestyle choices and visits with your health care provider that can promote health and wellness. What does preventive care include?  A yearly physical exam. This is also called an annual well check.  Dental exams once or twice a year.  Routine eye exams. Ask your health care provider how often you should have your eyes checked.  Personal lifestyle choices, including:  Daily care of your teeth and gums.  Regular physical activity.  Eating a healthy diet.  Avoiding tobacco and drug use.  Limiting alcohol use.  Practicing safe sex.  Taking low doses of aspirin every day.  Taking vitamin and mineral supplements as recommended by your health care provider. What happens during an annual well check? The services and screenings done by your health care provider during your annual well check will depend on your age, overall health, lifestyle risk factors, and family history of disease. Counseling  Your health  care provider may ask you questions about your:  Alcohol use.  Tobacco use.  Drug use.  Emotional well-being.  Home and relationship well-being.  Sexual activity.  Eating habits.  History of falls.  Memory and ability to understand (cognition).  Work and work Statistician. Screening  You may have the following tests or measurements:  Height, weight, and BMI.  Blood pressure.  Lipid and cholesterol levels. These may be checked every 5 years, or more frequently if you are over 28 years old.  Skin check.  Lung cancer screening. You may have this screening every year starting at age 65 if you have a 30-pack-year history of smoking and currently smoke or have quit within the past 15 years.  Fecal occult blood test (FOBT) of the stool. You may have this test every year starting at age 80.  Flexible sigmoidoscopy or colonoscopy. You may have a sigmoidoscopy every 5 years or a colonoscopy every 10 years starting at age 32.  Prostate cancer screening. Recommendations will vary depending on your family history and other risks.  Hepatitis C blood test.  Hepatitis B blood test.  Sexually transmitted disease (STD) testing.  Diabetes screening. This is done by checking your blood sugar (glucose) after you have not eaten for a while (fasting). You may have this done every 1-3 years.  Abdominal aortic aneurysm (AAA) screening. You may need this if you are a current or former smoker.  Osteoporosis. You may be screened starting at age 24 if you are at high risk. Talk with your health care provider about your test results, treatment options, and if necessary, the need  for more tests. Vaccines  Your health care provider may recommend certain vaccines, such as:  Influenza vaccine. This is recommended every year.  Tetanus, diphtheria, and acellular pertussis (Tdap, Td) vaccine. You may need a Td booster every 10 years.  Zoster vaccine. You may need this after age  60.  Pneumococcal 13-valent conjugate (PCV13) vaccine. One dose is recommended after age 25.  Pneumococcal polysaccharide (PPSV23) vaccine. One dose is recommended after age 71. Talk to your health care provider about which screenings and vaccines you need and how often you need them. This information is not intended to replace advice given to you by your health care provider. Make sure you discuss any questions you have with your health care provider. Document Released: 02/10/2015 Document Revised: 10/04/2015 Document Reviewed: 11/15/2014 Elsevier Interactive Patient Education  2017 [place] Prevention in the Home Falls can cause injuries. They can happen to people of all ages. There are many things you can do to make your home safe and to help prevent falls. What can I do on the outside of my home?  Regularly fix the edges of walkways and driveways and fix any cracks.  Remove anything that might make you trip as you walk through a door, such as a raised step or threshold.  Trim any bushes or trees on the path to your home.  Use bright outdoor lighting.  Clear any walking paths of anything that might make someone trip, such as rocks or tools.  Regularly check to see if handrails are loose or broken. Make sure that both sides of any steps have handrails.  Any raised decks and porches should have guardrails on the edges.  Have any leaves, snow, or ice cleared regularly.  Use sand or salt on walking paths during winter.  Clean up any spills in your garage right away. This includes oil or grease spills. What can I do in the bathroom?  Use night lights.  Install grab bars by the toilet and in the tub and shower. Do not use towel bars as grab bars.  Use non-skid mats or decals in the tub or shower.  If you need to sit down in the shower, use a plastic, non-slip stool.  Keep the floor dry. Clean up any water that spills on the floor as soon as it happens.  Remove  soap buildup in the tub or shower regularly.  Attach bath mats securely with double-sided non-slip rug tape.  Do not have throw rugs and other things on the floor that can make you trip. What can I do in the bedroom?  Use night lights.  Make sure that you have a light by your bed that is easy to reach.  Do not use any sheets or blankets that are too big for your bed. They should not hang down onto the floor.  Have a firm chair that has side arms. You can use this for support while you get dressed.  Do not have throw rugs and other things on the floor that can make you trip. What can I do in the kitchen?  Clean up any spills right away.  Avoid walking on wet floors.  Keep items that you use a lot in easy-to-reach places.  If you need to reach something above you, use a strong step stool that has a grab bar.  Keep electrical cords out of the way.  Do not use floor polish or wax that makes floors slippery. If you must use wax, use  non-skid floor wax.  Do not have throw rugs and other things on the floor that can make you trip. What can I do with my stairs?  Do not leave any items on the stairs.  Make sure that there are handrails on both sides of the stairs and use them. Fix handrails that are broken or loose. Make sure that handrails are as long as the stairways.  Check any carpeting to make sure that it is firmly attached to the stairs. Fix any carpet that is loose or worn.  Avoid having throw rugs at the top or bottom of the stairs. If you do have throw rugs, attach them to the floor with carpet tape.  Make sure that you have a light switch at the top of the stairs and the bottom of the stairs. If you do not have them, ask someone to add them for you. What else can I do to help prevent falls?  Wear shoes that:  Do not have high heels.  Have rubber bottoms.  Are comfortable and fit you well.  Are closed at the toe. Do not wear sandals.  If you use a  stepladder:  Make sure that it is fully opened. Do not climb a closed stepladder.  Make sure that both sides of the stepladder are locked into place.  Ask someone to hold it for you, if possible.  Clearly mark and make sure that you can see:  Any grab bars or handrails.  First and last steps.  Where the edge of each step is.  Use tools that help you move around (mobility aids) if they are needed. These include:  Canes.  Walkers.  Scooters.  Crutches.  Turn on the lights when you go into a dark area. Replace any light bulbs as soon as they burn out.  Set up your furniture so you have a clear path. Avoid moving your furniture around.  If any of your floors are uneven, fix them.  If there are any pets around you, be aware of where they are.  Review your medicines with your doctor. Some medicines can make you feel dizzy. This can increase your chance of falling. Ask your doctor what other things that you can do to help prevent falls. This information is not intended to replace advice given to you by your health care provider. Make sure you discuss any questions you have with your health care provider. Document Released: 11/10/2008 Document Revised: 06/22/2015 Document Reviewed: 02/18/2014 Elsevier Interactive Patient Education  2017 [employer].

## 2017-04-29 NOTE — Progress Notes (Signed)
Subjective:   Jon Mejia is a 77 y.o. male who presents for Medicare Annual/Subsequent preventive examination.  Review of Systems:  N/A  Cardiac Risk Factors include: advanced age (>39men, >73 women);dyslipidemia;hypertension;male gender;obesity (BMI >30kg/m2)     Objective:    Vitals: BP (!) 146/72 (BP Location: Right Arm)   Pulse 68   Temp 98 F (36.7 C) (Oral)   Ht 5\' 6"  (1.676 m)   Wt 198 lb (89.8 kg)   BMI 31.96 kg/m   Body mass index is 31.96 kg/m.  Advanced Directives 04/29/2017  Does Patient Have a Medical Advance Directive? Yes  Type of Paramedic of [place];Living will  Copy of [place] in Chart? No - copy requested    Tobacco Social History   Tobacco Use  Smoking Status Former Smoker  . Packs/day: 1.00  . Years: 20.00  . Pack years: 20.00  . Last attempt to quit: 03/12/1983  . Years since quitting: 34.1  Smokeless Tobacco Never Used     Counseling given: Not Answered   Clinical Intake:  Pre-visit preparation completed: Yes  Pain : No/denies pain Pain Score: 0-No pain     Nutritional Status: BMI > 30  Obese Nutritional Risks: None Diabetes: No  How often do you need to have someone help you when you read instructions, pamphlets, or other written materials from your doctor or pharmacy?: 1 - Never  Interpreter Needed?: No  Information entered by :: [facility], LPN  Past Medical History:  Diagnosis Date  . History of chicken pox   . History of measles    History reviewed. No pertinent surgical history. Family History  Problem Relation Age of Onset  . Arrhythmia Mother        A-Fib  . Diabetes Son   . Colon cancer Neg Hx   . Prostate cancer Neg Hx    Social History   Socioeconomic History  . Marital status: Married    Spouse name: Not on file  . Number of children: 2  . Years of education: Not on file  . Highest education level: 12th grade  Occupational History  . Occupation:  Retired  Scientific laboratory technician  . Financial resource strain: Not hard at all  . Food insecurity:    Worry: Never true    Inability: Never true  . Transportation needs:    Medical: No    Non-medical: No  Tobacco Use  . Smoking status: Former Smoker    Packs/day: 1.00    Years: 20.00    Pack years: 20.00    Last attempt to quit: 03/12/1983    Years since quitting: 34.1  . Smokeless tobacco: Never Used  Substance and Sexual Activity  . Alcohol use: No  . Drug use: No  . Sexual activity: Not on file  Lifestyle  . Physical activity:    Days per week: Not on file    Minutes per session: Not on file  . Stress: Not at all  Relationships  . Social connections:    Talks on phone: Not on file    Gets together: Not on file    Attends religious service: Not on file    Active member of club or organization: Not on file    Attends meetings of clubs or organizations: Not on file    Relationship status: Not on file  Other Topics Concern  . Not on file  Social History Narrative  . Not on file    Outpatient Encounter  Medications as of 04/29/2017  Medication Sig  . amLODipine (NORVASC) 5 MG tablet Take 1 tablet (5 mg total) by mouth daily.  Marland Kitchen aspirin 81 MG tablet Take 81 mg by mouth daily.  . diclofenac sodium (VOLTAREN) 1 % GEL Apply 2 g topically 4 (four) times daily.  Marland Kitchen lovastatin (MEVACOR) 40 MG tablet Take 1 tablet (40 mg total) by mouth at bedtime.  . meloxicam (MOBIC) 15 MG tablet take 1 tablet by mouth once daily after meals if needed for BACK PAIN   No facility-administered encounter medications on file as of 04/29/2017.     Activities of Daily Living In your present state of health, do you have any difficulty performing the following activities: 04/29/2017  Hearing? N  Vision? N  Difficulty concentrating or making decisions? N  Walking or climbing stairs? N  Dressing or bathing? N  Doing errands, shopping? N  Preparing Food and eating ? N  Using the Toilet? N  In the past six  months, have you accidently leaked urine? N  Do you have problems with loss of bowel control? N  Managing your Medications? N  Managing your Finances? N  Housekeeping or managing your Housekeeping? N  Some recent data might be hidden    Patient Care Team: [name], MD as PCP - General (Family Medicine) [name], Ward as Consulting Physician (Optometry)   Assessment:   This is a routine wellness examination for [facility].  Exercise Activities and Dietary recommendations Current Exercise Habits: Home exercise routine, Type of exercise: walking, Frequency (Times/Week): 2, Intensity: Mild  Goals    . Exercise 3x per week (30 min per time)     Recommend to walk 3 days a week for 30 minutes at a time without stopping.        Fall Risk Fall Risk  04/29/2017 01/25/2016 01/24/2015  Falls in the past year? No No No   Is the patient's home free of loose throw rugs in walkways, pet beds, electrical cords, etc?   yes      Grab bars in the bathroom? yes      Handrails on the stairs?   no      Adequate lighting?   yes  Timed Get Up and Go Performed: N/A  Depression Screen PHQ 2/9 Scores 04/29/2017 04/29/2017 01/25/2016 01/24/2015  PHQ - 2 Score 0 0 0 0  PHQ- 9 Score 0 - - -    Cognitive Function: Pt declined screening today.         Immunization History  Administered Date(s) Administered  . Influenza, High Dose Seasonal PF 01/17/2014, 01/25/2016, 01/09/2017  . Influenza,inj,Quad PF,6+ Mos 01/24/2015  . Pneumococcal Conjugate-13 07/12/2013  . Pneumococcal Polysaccharide-23 01/07/2008  . Td 12/15/2003    Qualifies for Shingles Vaccine? Due for Shingles vaccine. Declined my offer to administer today. Education has been provided regarding the importance of this vaccine. Pt has been advised to call her insurance company to determine her out of pocket expense. Advised she may also receive this vaccine at her local pharmacy or Health Dept. Verbalized acceptance and  understanding.  Screening Tests Health Maintenance  Topic Date Due  . TETANUS/TDAP  12/14/2013  . INFLUENZA VACCINE  08/28/2017  . COLONOSCOPY  03/16/2019  . PNA vac Low Risk Adult  Completed   Cancer Screenings: Lung: Low Dose CT Chest recommended if Age 23-80 years, 30 pack-year currently smoking OR have quit w/in 15years. Patient does not qualify. Colorectal: Up to date  Additional Screenings:  Hepatitis C Screening: N/A    Plan:  I have personally reviewed and addressed the Medicare Annual Wellness questionnaire and have noted the following in the patient's chart:  A. Medical and social history B. Use of alcohol, tobacco or illicit drugs  C. Current medications and supplements D. Functional ability and status E.  Nutritional status F.  Physical activity G. Advance directives H. List of other physicians I.  Hospitalizations, surgeries, and ER visits in previous 12 months J.  [place] such as hearing and vision if needed, cognitive and depression L. Referrals and appointments - none  In addition, I have reviewed and discussed with patient certain preventive protocols, quality metrics, and best practice recommendations. A written personalized care plan for preventive services as well as general preventive health recommendations were provided to patient.  See attached scanned questionnaire for additional information.   Signed,  Fabio Neighbors, LPN Nurse Health Advisor   Nurse Recommendations: Pt states he had a tetanus vaccine when he was out of town a couple years ago. Pt states this was at [facility] in [place]. Will contact pharmacy to retrieve records and update HM.

## 2017-04-29 NOTE — Progress Notes (Signed)
Patient: Jon Mejia, Male    DOB: Mar 19, 1940, 76 y.o.   MRN: [id number] Visit Date: 04/29/2017  Today's Provider: [name], MD   Chief Complaint  Patient presents with  . Annual Exam  . Hypertension  . Hyperlipidemia   Subjective:   Patient saw Jon Mejia for AWV today at 8:30 am.   Complete Physical Jon Mejia is a 77 y.o. male. He feels well. He reports exercising yes. He reports he is sleeping poorly.  -----------------------------------------------------------   Hypertension, follow-up:  BP Readings from Last 3 Encounters:  04/29/17 (!) 146/72  01/09/17 138/86  07/17/16 130/88    He was last seen for hypertension 4 months ago.  BP at that visit was 150/100. Management since that visit includes; no changes.He reports good compliance with treatment. He is not having side effects. none He is exercising. He is adherent to low salt diet.   Outside blood pressures are 140/78. He is experiencing none.  Patient denies none.   Cardiovascular risk factors include advanced age (older than 74 for men, 24 for women).  Use of agents associated with hypertension: none.   ----------------------------------------------------------------   Lipid/Cholesterol, Follow-up:   Last seen for this 4 months ago.  Management since that visit includes; labs checked, increased lovastatin to 40 mg qd.  Last Lipid Panel:    Component Value Date/Time   CHOL 185 01/09/2017 0906   CHOL 161 01/25/2016 0940   TRIG 99 01/09/2017 0906   HDL 45 01/09/2017 0906   HDL 40 01/25/2016 0940   CHOLHDL 4.1 01/09/2017 0906   LDLCALC 119 (H) 01/09/2017 0906    He reports good compliance with treatment. He is not having side effects. none  Wt Readings from Last 3 Encounters:  04/29/17 198 lb (89.8 kg)  01/09/17 201 lb (91.2 kg)  07/17/16 192 lb (87.1 kg)    ------------------------------------------------------------  Prediabetes From 01/09/2017-no changes, Hemoglobin  A1c 5.9.   Review of Systems  Constitutional: Negative for chills, diaphoresis and fever.  HENT: Negative for congestion, ear discharge, ear pain, hearing loss, nosebleeds, sore throat and tinnitus.   Eyes: Negative for photophobia, pain, discharge and redness.  Respiratory: Positive for shortness of breath. Negative for cough, wheezing and stridor.   Cardiovascular: Negative for chest pain, palpitations and leg swelling.  Gastrointestinal: Negative for abdominal pain, blood in stool, constipation, diarrhea, nausea and vomiting.  Endocrine: Negative for polydipsia.  Genitourinary: Negative for dysuria, flank pain, frequency, hematuria and urgency.  Musculoskeletal: Positive for arthralgias and back pain. Negative for myalgias and neck pain.  Skin: Negative for rash.  Allergic/Immunologic: Negative for environmental allergies.  Neurological: Negative for dizziness, tremors, seizures, weakness and headaches.  Hematological: Does not bruise/bleed easily.  Psychiatric/Behavioral: Negative for hallucinations and suicidal ideas. The patient is not nervous/anxious.     Social History   Socioeconomic History  . Marital status: Married    Spouse name: Not on file  . Number of children: 2  . Years of education: Not on file  . Highest education level: 12th grade  Occupational History  . Occupation: Retired  Scientific laboratory technician  . Financial resource strain: Not hard at all  . Food insecurity:    Worry: Never true    Inability: Never true  . Transportation needs:    Medical: No    Non-medical: No  Tobacco Use  . Smoking status: Former Smoker    Packs/day: 1.00    Years: 20.00    Pack years: 20.00  Last attempt to quit: 03/12/1983    Years since quitting: 34.1  . Smokeless tobacco: Never Used  Substance and Sexual Activity  . Alcohol use: No  . Drug use: No  . Sexual activity: Not on file  Lifestyle  . Physical activity:    Days per week: Not on file    Minutes per session: Not on  file  . Stress: Not at all  Relationships  . Social connections:    Talks on phone: Not on file    Gets together: Not on file    Attends religious service: Not on file    Active member of club or organization: Not on file    Attends meetings of clubs or organizations: Not on file    Relationship status: Not on file  . Intimate partner violence:    Fear of current or ex partner: Not on file    Emotionally abused: Not on file    Physically abused: Not on file    Forced sexual activity: Not on file  Other Topics Concern  . Not on file  Social History Narrative  . Not on file    Past Medical History:  Diagnosis Date  . History of chicken pox   . History of measles      Patient Active Problem List   Diagnosis Date Noted  . Hyperglycemia 01/26/2016  . 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 Back pain with left-sided radiculopathy 01/19/2015  . Syncope 10/21/2011  . Pure hypercholesterolemia 01/11/2008  . Essential (primary) hypertension 06/08/2007  . Premature atrial beats 01/05/2007    No past surgical history on file.  His family history includes Arrhythmia in his mother; Diabetes in his son. There is no history of Colon cancer or Prostate cancer.      Current Outpatient Medications:  .  amLODipine (NORVASC) 5 MG tablet, Take 1 tablet (5 mg total) by mouth daily., Disp: 30 tablet, Rfl: 12 .  aspirin 81 MG tablet, Take 81 mg by mouth daily., Disp: , Rfl:  .  diclofenac sodium (VOLTAREN) 1 % GEL, Apply 2 g topically 4 (four) times daily., Disp: 100 g, Rfl: 2 .  lovastatin (MEVACOR) 40 MG tablet, Take 1 tablet (40 mg total) by mouth at bedtime., Disp: 30 tablet, Rfl: 5 .  meloxicam (MOBIC) 15 MG tablet, take 1 tablet by mouth once daily after meals if needed for BACK PAIN, Disp: 30 tablet, Rfl: 2  Patient Care Team: [name], MD as PCP - General (Family Medicine) [name], OD as Consulting Physician (Optometry)     Objective:   Vitals:   BP 146/72  (BP Location: Right Arm)   Pulse 68   Temp 98 F (36.7 C) (Oral)   Ht 5\' 6"  (1.676 m)   Wt 198 lb (89.8 kg)   BMI 31.96 kg/m   BSA 2.04 m   Repeat  BP (!) 148/80    Physical Exam   General Appearance:    Alert, cooperative, no distress, appears stated age  Head:    Normocephalic, without obvious abnormality, atraumatic  Eyes:    PERRL, conjunctiva/corneas clear, EOM's intact, fundi    benign, both eyes       Ears:    Normal TM's and external ear canals, both ears  Nose:   Nares normal, septum midline, mucosa normal, no drainage   or sinus tenderness  Throat:   Lips, mucosa, and tongue normal; teeth and gums normal  Neck:   Supple, symmetrical, trachea midline,  no adenopathy;       thyroid:  No enlargement/tenderness/nodules; no carotid   bruit or JVD  Back:     Symmetric, no curvature, ROM normal, no CVA tenderness  Lungs:     Clear to auscultation bilaterally, respirations unlabored  Chest wall:    No tenderness or deformity  Heart:    Regular rate and rhythm, S1 and S2 normal, III/VI SM RUSB, no  rub  or gallop  Abdomen:     Soft, non-tender, bowel sounds active all four quadrants,    no masses, no organomegaly, small umbilical hernia  Genitalia:    deferred  Rectal:    deferred  Extremities:   Extremities normal, atraumatic, no cyanosis or edema  Pulses:   2+ and symmetric all extremities  Skin:   Skin color, texture, turgor normal, no rashes or lesions  Lymph nodes:   Cervical, supraclavicular, and axillary nodes normal  Neurologic:   CNII-XII intact. Normal strength, sensation and reflexes      throughout    Activities of Daily Living In your present state of health, do you have any difficulty performing the following activities: 04/29/2017  Hearing? N  Vision? N  Difficulty concentrating or making decisions? N  Walking or climbing stairs? N  Dressing or bathing? N  Doing errands, shopping? N  Preparing Food and eating ? N  Using the Toilet? N  In the past six  months, have you accidently leaked urine? N  Do you have problems with loss of bowel control? N  Managing your Medications? N  Managing your Finances? N  Housekeeping or managing your Housekeeping? N  Some recent data might be hidden    Fall Risk Assessment Fall Risk  04/29/2017 01/25/2016 01/24/2015  Falls in the past year? No No No     Depression Screen PHQ 2/9 Scores 04/29/2017 04/29/2017 01/25/2016 01/24/2015  PHQ - 2 Score 0 0 0 0  PHQ- 9 Score 0 - - -       Assessment & Plan:    Annual Physical Reviewed patient's Family Medical History Reviewed and updated list of patient's medical providers Assessment of cognitive impairment was done Assessed patient's functional ability Established a written schedule for health screening [place] Completed and Reviewed  Exercise Activities and Dietary recommendations Goals    . Exercise 3x per week (30 min per time)     Recommend to walk 3 days a week for 30 minutes at a time without stopping.        Immunization History  Administered Date(s) Administered  . Influenza, High Dose Seasonal PF 01/17/2014, 01/25/2016, 01/09/2017  . Influenza,inj,Quad PF,6+ Mos 01/24/2015  . Pneumococcal Conjugate-13 07/12/2013  . Pneumococcal Polysaccharide-23 01/07/2008  . Td 12/15/2003    Health Maintenance  Topic Date Due  . TETANUS/TDAP  12/14/2013  . INFLUENZA VACCINE  08/28/2017  . COLONOSCOPY  03/16/2019  . PNA vac Low Risk Adult  Completed     Discussed health benefits of physical activity, and encouraged him to engage in regular exercise appropriate for his age and condition.    ---------------------------------------------------------------------  1. Annual physical exam  - Comprehensive metabolic panel - Lipid panel - PSA - Hemoglobin A1c - EKG 12-Lead  2. Essential (primary) hypertension SBP consistently over 140 with similar BP readings at home per patient.  - EKG 12-Lead  3. Pure  hypercholesterolemia  - Comprehensive metabolic panel - Lipid panel  4. Hyperglycemia  - Hemoglobin A1c  5. BMI 31.0-31.9,adult   6.  Prostate cancer screening  - PSA   [name], MD  [facility]

## 2017-04-30 LAB — COMPREHENSIVE METABOLIC PANEL
A/G RATIO: 1.7 (ref 1.2–2.2)
ALBUMIN: 4.4 g/dL (ref 3.5–4.8)
ALT: 18 IU/L (ref 0–44)
AST: 21 IU/L (ref 0–40)
Alkaline Phosphatase: 75 IU/L (ref 39–117)
BUN / CREAT RATIO: 11 (ref 10–24)
BUN: 12 mg/dL (ref 8–27)
Bilirubin Total: 0.6 mg/dL (ref 0.0–1.2)
CALCIUM: 9.6 mg/dL (ref 8.6–10.2)
CO2: 21 mmol/L (ref 20–29)
CREATININE: 1.08 mg/dL (ref 0.76–1.27)
Chloride: 106 mmol/L (ref 96–106)
GFR calc Af Amer: 78 mL/min/{1.73_m2} (ref >59)
GFR, EST NON AFRICAN AMERICAN: 67 mL/min/{1.73_m2} (ref >59)
GLOBULIN, TOTAL: 2.6 g/dL (ref 1.5–4.5)
Glucose: 94 mg/dL (ref 65–99)
POTASSIUM: 4.8 mmol/L (ref 3.5–5.2)
SODIUM: 144 mmol/L (ref 134–144)
Total Protein: 7 g/dL (ref 6.0–8.5)

## 2017-04-30 LAB — PSA: PROSTATE SPECIFIC AG, SERUM: 3.2 ng/mL (ref 0.0–4.0)

## 2017-04-30 LAB — HEMOGLOBIN A1C
ESTIMATED AVERAGE GLUCOSE: 128 mg/dL
HEMOGLOBIN A1C: 6.1 % — AB (ref 4.8–5.6)

## 2017-04-30 LAB — LIPID PANEL
Chol/HDL Ratio: 3.4 ratio (ref 0.0–5.0)
Cholesterol, Total: 138 mg/dL (ref 100–199)
HDL: 41 mg/dL (ref >39)
LDL CALC: 83 mg/dL (ref 0–99)
Triglycerides: 72 mg/dL (ref 0–149)
VLDL Cholesterol Cal: 14 mg/dL (ref 5–40)

## 2017-05-05 ENCOUNTER — Other Ambulatory Visit: Payer: Self-pay

## 2017-05-05 MED ORDER — HYDROCHLOROTHIAZIDE 25 MG PO TABS: 25.0000 mg | ORAL_TABLET | Freq: Every day | ORAL | 2 refills | Status: DC

## 2017-05-05 MED ORDER — LOVASTATIN 40 MG PO TABS: 40.0000 mg | ORAL_TABLET | Freq: Every day | ORAL | 2 refills | Status: DC

## 2017-05-05 NOTE — Telephone Encounter (Signed)
Pt advised of lab results. Please send HCTZ to [facility] and he also needs a refill on Lovastatin 40mg .   Thanks,   -Mickel Baas

## 2017-06-05 ENCOUNTER — Encounter: Payer: Self-pay | Admitting: Family Medicine

## 2017-06-05 ENCOUNTER — Ambulatory Visit (INDEPENDENT_AMBULATORY_CARE_PROVIDER_SITE_OTHER): Payer: Medicare Other | Admitting: Family Medicine

## 2017-06-05 VITALS — BP 138/72 | HR 67 | Temp 98.0°F | Resp 16 | Wt 191.0 lb

## 2017-06-05 DIAGNOSIS — I10 Essential (primary) hypertension: Secondary | ICD-10-CM | POA: Diagnosis not present

## 2017-06-05 NOTE — Progress Notes (Signed)
       Patient: Jon Mejia Male    DOB: [birth year]   77 y.o.   MRN: [id number] Visit Date: 06/05/2017  Today's Provider: [name], MD   Chief Complaint  Patient presents with  . Follow-up  . Hypertension   Subjective:    HPI   Hypertension, follow-up:  BP Readings from Last 3 Encounters:  04/29/17 (!) 148/80  04/29/17 (!) 146/72  01/09/17 138/86    He was last seen for hypertension 1 months ago.  BP at that visit was 148/80. Management since that visit includes; added HCTZ 25 mg qd.He reports good compliance with treatment. He is having side effects (excessive urination) He is exercising. He is adherent to low salt diet.   Outside blood pressures are checked, but patient doesn't remember what the reading average. He is experiencing none.  Patient denies chest pain, chest pressure/discomfort, claudication, dyspnea, exertional chest pressure/discomfort, fatigue, irregular heart beat, lower extremity edema, near-syncope, orthopnea, palpitations, paroxysmal nocturnal dyspnea, syncope and tachypnea.   Cardiovascular risk factors include advanced age (older than 36 for men, 69 for women), hypertension and male gender.  Use of agents associated with hypertension: NSAIDS.   ----------------------------------------------------------------    No Known Allergies   Current Outpatient Medications:  .  amLODipine (NORVASC) 5 MG tablet, Take 1 tablet (5 mg total) by mouth daily., Disp: 30 tablet, Rfl: 12 .  aspirin 81 MG tablet, Take 81 mg by mouth daily., Disp: , Rfl:  .  diclofenac sodium (VOLTAREN) 1 % GEL, Apply 2 g topically 4 (four) times daily., Disp: 100 g, Rfl: 2 .  hydrochlorothiazide (HYDRODIURIL) 25 MG tablet, Take 1 tablet (25 mg total) by mouth daily., Disp: 30 tablet, Rfl: 2 .  lovastatin (MEVACOR) 40 MG tablet, Take 1 tablet (40 mg total) by mouth at bedtime., Disp: 90 tablet, Rfl: 2 .  meloxicam (MOBIC) 15 MG tablet, take 1 tablet by mouth once daily after  meals if needed for BACK PAIN, Disp: 30 tablet, Rfl: 2  Review of Systems  Constitutional: Negative for appetite change, chills and fever.  Respiratory: Negative for chest tightness, shortness of breath and wheezing.   Cardiovascular: Negative for chest pain and palpitations.  Gastrointestinal: Negative for abdominal pain, nausea and vomiting.    Social History   Tobacco Use  . Smoking status: Former Smoker    Packs/day: 1.00    Years: 20.00    Pack years: 20.00    Last attempt to quit: 03/12/1983    Years since quitting: 34.2  . Smokeless tobacco: Never Used  Substance Use Topics  . Alcohol use: No   Objective:   BP 138/72 (BP Location: Left Arm, Patient Position: Sitting, Cuff Size: Large)   Pulse 67   Temp 98 F (36.7 C) (Oral)   Resp 16   Wt 191 lb (86.6 kg)   SpO2 97% Comment: room air  BMI 30.83 kg/m     Physical Exam  General appearance: alert, well developed, well nourished, cooperative and in no distress Head: Normocephalic, without obvious abnormality, atraumatic Respiratory: Respirations even and unlabored, normal respiratory rate      Assessment & Plan:     1. Essential (primary) hypertension Controlled with addition of hctz which he is tolerating well. Follow up 5-6 months to che check BP renal and a1c.        [name], MD  [facility]

## 2017-08-08 ENCOUNTER — Other Ambulatory Visit: Payer: Self-pay | Admitting: Family Medicine

## 2017-11-06 ENCOUNTER — Other Ambulatory Visit: Payer: Self-pay | Admitting: Family Medicine

## 2017-11-28 HISTORY — PX: CARDIAC CATHETERIZATION: SHX172

## 2017-12-02 ENCOUNTER — Telehealth: Payer: Self-pay | Admitting: Family Medicine

## 2017-12-02 DIAGNOSIS — R739 Hyperglycemia, unspecified: Secondary | ICD-10-CM

## 2017-12-02 DIAGNOSIS — I10 Essential (primary) hypertension: Secondary | ICD-10-CM

## 2017-12-02 NOTE — Telephone Encounter (Signed)
Patient number was disconnected. But patient wife Stanton Kidney was advised and states she will have patient come in the morning to get labs drawn. Lab order was printed also.

## 2017-12-02 NOTE — Telephone Encounter (Signed)
Please advise patient needs to get labs drawn before appointment on Friday. Please print order and leave at lab for him. Thanks.

## 2017-12-03 DIAGNOSIS — Z7689 Persons encountering health services in other specified circumstances: Secondary | ICD-10-CM | POA: Diagnosis not present

## 2017-12-03 DIAGNOSIS — I10 Essential (primary) hypertension: Secondary | ICD-10-CM | POA: Diagnosis not present

## 2017-12-03 DIAGNOSIS — R739 Hyperglycemia, unspecified: Secondary | ICD-10-CM | POA: Diagnosis not present

## 2017-12-03 DIAGNOSIS — I499 Cardiac arrhythmia, unspecified: Secondary | ICD-10-CM | POA: Diagnosis not present

## 2017-12-04 LAB — RENAL FUNCTION PANEL
ALBUMIN: 4.6 g/dL (ref 3.5–4.8)
BUN / CREAT RATIO: 14 (ref 10–24)
BUN: 17 mg/dL (ref 8–27)
CO2: 20 mmol/L (ref 20–29)
Calcium: 9.4 mg/dL (ref 8.6–10.2)
Chloride: 104 mmol/L (ref 96–106)
Creatinine, Ser: 1.23 mg/dL (ref 0.76–1.27)
GFR calc non Af Amer: 57 mL/min/{1.73_m2} — ABNORMAL LOW (ref >59)
GFR, EST AFRICAN AMERICAN: 66 mL/min/{1.73_m2} (ref >59)
Glucose: 98 mg/dL (ref 65–99)
Phosphorus: 2.7 mg/dL (ref 2.5–4.5)
Potassium: 4.5 mmol/L (ref 3.5–5.2)
Sodium: 139 mmol/L (ref 134–144)

## 2017-12-04 LAB — HEMOGLOBIN A1C
Est. average glucose Bld gHb Est-mCnc: 123 mg/dL
Hgb A1c MFr Bld: 5.9 % — ABNORMAL HIGH (ref 4.8–5.6)

## 2017-12-05 ENCOUNTER — Ambulatory Visit (INDEPENDENT_AMBULATORY_CARE_PROVIDER_SITE_OTHER): Payer: Medicare Other | Admitting: Family Medicine

## 2017-12-05 ENCOUNTER — Other Ambulatory Visit: Payer: Self-pay

## 2017-12-05 ENCOUNTER — Encounter: Payer: Self-pay | Admitting: Family Medicine

## 2017-12-05 VITALS — BP 138/62 | Temp 97.9°F | Resp 16 | Wt 194.0 lb

## 2017-12-05 DIAGNOSIS — R9431 Abnormal electrocardiogram [ECG] [EKG]: Secondary | ICD-10-CM

## 2017-12-05 DIAGNOSIS — I499 Cardiac arrhythmia, unspecified: Secondary | ICD-10-CM

## 2017-12-05 DIAGNOSIS — R739 Hyperglycemia, unspecified: Secondary | ICD-10-CM | POA: Diagnosis not present

## 2017-12-05 DIAGNOSIS — I10 Essential (primary) hypertension: Secondary | ICD-10-CM | POA: Diagnosis not present

## 2017-12-05 DIAGNOSIS — Z23 Encounter for immunization: Secondary | ICD-10-CM | POA: Diagnosis not present

## 2017-12-05 NOTE — Patient Outreach (Signed)
[facility]) Care Management  12/05/2017  Jon Mejia Apr 13, 1940 [id number]   Medication Adherence call to [name] patient did not answer patient is due on Lovastatin 40 mg. [name] is showing past due under [place].   [place] Management Direct Dial [phone]  Fax [phone] [name]

## 2017-12-05 NOTE — Progress Notes (Signed)
Patient: Jon Mejia Male    DOB: 15-Jun-1940   77 y.o.   MRN: [id number] Visit Date: 12/05/2017  Today's Provider: [name], MD   Chief Complaint  Patient presents with  . Hypertension  . Hyperglycemia   Subjective:    HPI  Hypertension, follow-up:  BP Readings from Last 3 Encounters:  12/05/17 138/62  06/05/17 138/72  04/29/17 (!) 148/80    He was last seen for hypertension 6 months ago.  BP at that visit was 138/72. Management since that visit includes no changes. He reports good compliance with treatment. He is not having side effects.  He is not exercising. He is adherent to low salt diet.   Outside blood pressures are checked occasionally. He is experiencing none.  Patient denies exertional chest pressure/discomfort, lower extremity edema and palpitations.   Cardiovascular risk factors include dyslipidemia.   Weight trend: stable Wt Readings from Last 3 Encounters:  12/05/17 194 lb (88 kg)  06/05/17 191 lb (86.6 kg)  04/29/17 198 lb (89.8 kg)    Current diet: well balanced     Prediabetes, Follow-up:   Lab Results  Component Value Date   HGBA1C 5.9 (H) 12/03/2017   HGBA1C 6.1 (H) 04/29/2017   HGBA1C 5.9 (H) 01/09/2017   GLUCOSE 98 12/03/2017   GLUCOSE 94 04/29/2017   GLUCOSE 111 (H) 01/09/2017    Last seen for for this6 months ago.  Management since that visit includes no changes. Current symptoms include none and have been stable.   No Known Allergies   Current Outpatient Medications:  .  amLODipine (NORVASC) 5 MG tablet, Take 1 tablet (5 mg total) by mouth daily., Disp: 30 tablet, Rfl: 12 .  aspirin 81 MG tablet, Take 81 mg by mouth daily., Disp: , Rfl:  .  diclofenac sodium (VOLTAREN) 1 % GEL, Apply 2 g topically 4 (four) times daily., Disp: 100 g, Rfl: 2 .  hydrochlorothiazide (HYDRODIURIL) 25 MG tablet, TAKE ONE TABLET EVERY DAY, Disp: 30 tablet, Rfl: 11 .  lovastatin (MEVACOR) 40 MG tablet, TAKE ONE TABLET AT BEDTIME,  Disp: 90 tablet, Rfl: 4 .  meloxicam (MOBIC) 15 MG tablet, take 1 tablet by mouth once daily after meals if needed for BACK PAIN, Disp: 30 tablet, Rfl: 2  Review of Systems  Constitutional: Negative.   Respiratory: Negative for cough and shortness of breath.   Cardiovascular: Negative for chest pain, palpitations and leg swelling.  Endocrine: Negative for cold intolerance, heat intolerance, polydipsia, polyphagia and polyuria.  Musculoskeletal: Positive for arthralgias.  Neurological: Negative for dizziness, tremors, weakness, light-headedness and headaches.  Psychiatric/Behavioral: Negative.     Social History   Tobacco Use  . Smoking status: Former Smoker    Packs/day: 1.00    Years: 20.00    Pack years: 20.00    Last attempt to quit: 03/12/1983    Years since quitting: 34.7  . Smokeless tobacco: Never Used  Substance Use Topics  . Alcohol use: No   Objective:   BP 138/62 (BP Location: Left Arm, Patient Position: Sitting, Cuff Size: Normal)   Temp 97.9 F (36.6 C)   Resp 16   Wt 194 lb (88 kg)   SpO2 98%   BMI 31.31 kg/m  Vitals:   12/05/17 0845  BP: 138/62  Resp: 16  Temp: 97.9 F (36.6 C)  SpO2: 98%  Weight: 194 lb (88 kg)     Physical Exam   General Appearance:    Alert, cooperative, no  distress  Eyes:    PERRL, conjunctiva/corneas clear, EOM's intact       Lungs:     Clear to auscultation bilaterally, respirations unlabored  Heart:    Regularly irregular with couple beats.   Neurologic:   Awake, alert, oriented x 3. No apparent focal neurological           defect.       Results for orders placed or performed in visit on 12/02/17  Renal function panel  Result Value Ref Range   Glucose 98 65 - 99 mg/dL   BUN 17 8 - 27 mg/dL   Creatinine, Ser 1.23 0.76 - 1.27 mg/dL   GFR calc non Af Amer 57 (L) >59 mL/min/1.73   GFR calc Af Amer 66 >59 mL/min/1.73   BUN/Creatinine Ratio 14 10 - 24   Sodium 139 134 - 144 mmol/L   Potassium 4.5 3.5 - 5.2 mmol/L    Chloride 104 96 - 106 mmol/L   CO2 20 20 - 29 mmol/L   Calcium 9.4 8.6 - 10.2 mg/dL   Phosphorus 2.7 2.5 - 4.5 mg/dL   Albumin 4.6 3.5 - 4.8 g/dL  Hemoglobin A1c  Result Value Ref Range   Hgb A1c MFr Bld 5.9 (H) 4.8 - 5.6 %   Est. average glucose Bld gHb Est-mCnc 123 mg/dL  TSH  Result Value Ref Range   TSH 2.930 0.450 - 4.500 uIU/mL  Magnesium  Result Value Ref Range   Magnesium 2.3 1.6 - 2.3 mg/dL  Specimen status report  Result Value Ref Range   specimen status report Comment     EKG: QRS couplets with R-R interval alternating between 0.6 and 1.12 seconds with discernable P waves    Assessment & Plan:     1. Essential (primary) hypertension Well controlled.  Continue current medications.   - EKG 12-Lead  2. Hyperglycemia Diet controled.   3. Irregular heart rhythm - EKG 12-lead  4. Abnormal electrocardiogram (ECG) (EKG) Sinus arrhythmia versus atrial arrhythmia versus AV conduction block.  - Ambulatory referral to Cardiology  5. Need for influenza vaccination  - Flu vaccine HIGH DOSE PF (Fluzone High dose)       [name], MD  [facility]

## 2017-12-09 ENCOUNTER — Telehealth: Payer: Self-pay

## 2017-12-09 LAB — SPECIMEN STATUS REPORT

## 2017-12-09 LAB — TSH: TSH: 2.93 u[IU]/mL (ref 0.450–4.500)

## 2017-12-09 LAB — MAGNESIUM: Magnesium: 2.3 mg/dL (ref 1.6–2.3)

## 2017-12-09 NOTE — Telephone Encounter (Signed)
-----   Message from [name], MD sent at 12/09/2017  8:14 AM EST ----- Additional labs show normal magnesium and thyroid levels. After looking at his EKG more carefully, he may have an electrical block that needs further evaluation. Should see cardiologist to check this out. Have entered order for cardiology referral.

## 2017-12-09 NOTE — Telephone Encounter (Signed)
Pt advised.   Thanks,   -Sydell Prowell  

## 2017-12-12 DIAGNOSIS — R011 Cardiac murmur, unspecified: Secondary | ICD-10-CM | POA: Diagnosis not present

## 2017-12-12 DIAGNOSIS — I10 Essential (primary) hypertension: Secondary | ICD-10-CM | POA: Diagnosis not present

## 2017-12-12 DIAGNOSIS — I491 Atrial premature depolarization: Secondary | ICD-10-CM | POA: Diagnosis not present

## 2017-12-12 DIAGNOSIS — I700 Atherosclerosis of aorta: Secondary | ICD-10-CM | POA: Diagnosis not present

## 2017-12-12 DIAGNOSIS — R002 Palpitations: Secondary | ICD-10-CM | POA: Diagnosis not present

## 2017-12-22 DIAGNOSIS — R002 Palpitations: Secondary | ICD-10-CM | POA: Diagnosis not present

## 2017-12-22 DIAGNOSIS — R011 Cardiac murmur, unspecified: Secondary | ICD-10-CM | POA: Diagnosis not present

## 2018-01-01 DIAGNOSIS — R011 Cardiac murmur, unspecified: Secondary | ICD-10-CM | POA: Diagnosis not present

## 2018-01-09 DIAGNOSIS — I491 Atrial premature depolarization: Secondary | ICD-10-CM | POA: Diagnosis not present

## 2018-01-09 DIAGNOSIS — I10 Essential (primary) hypertension: Secondary | ICD-10-CM | POA: Diagnosis not present

## 2018-01-09 DIAGNOSIS — E7800 Pure hypercholesterolemia, unspecified: Secondary | ICD-10-CM | POA: Diagnosis not present

## 2018-01-09 DIAGNOSIS — I700 Atherosclerosis of aorta: Secondary | ICD-10-CM | POA: Diagnosis not present

## 2018-01-16 DIAGNOSIS — H0288A Meibomian gland dysfunction right eye, upper and lower eyelids: Secondary | ICD-10-CM | POA: Diagnosis not present

## 2018-01-16 DIAGNOSIS — H524 Presbyopia: Secondary | ICD-10-CM | POA: Diagnosis not present

## 2018-01-16 DIAGNOSIS — H2513 Age-related nuclear cataract, bilateral: Secondary | ICD-10-CM | POA: Diagnosis not present

## 2018-01-16 DIAGNOSIS — H0288B Meibomian gland dysfunction left eye, upper and lower eyelids: Secondary | ICD-10-CM | POA: Diagnosis not present

## 2018-03-13 ENCOUNTER — Other Ambulatory Visit (HOSPITAL_COMMUNITY): Payer: Self-pay

## 2018-03-13 ENCOUNTER — Emergency Department (HOSPITAL_COMMUNITY): Payer: Medicare Other

## 2018-03-13 ENCOUNTER — Encounter (HOSPITAL_COMMUNITY): Payer: Self-pay

## 2018-03-13 ENCOUNTER — Inpatient Hospital Stay (HOSPITAL_COMMUNITY)
Admission: EM | Admit: 2018-03-13 | Discharge: 2018-03-15 | DRG: 123 | Disposition: A | Discharge status: Home / Self Care | Payer: Medicare Other | Attending: Neurology | Admitting: Neurology

## 2018-03-13 ENCOUNTER — Other Ambulatory Visit: Payer: Self-pay

## 2018-03-13 DIAGNOSIS — Z8249 Family history of ischemic heart disease and other diseases of the circulatory system: Secondary | ICD-10-CM

## 2018-03-13 DIAGNOSIS — I350 Nonrheumatic aortic (valve) stenosis: Secondary | ICD-10-CM | POA: Diagnosis present

## 2018-03-13 DIAGNOSIS — I6502 Occlusion and stenosis of left vertebral artery: Secondary | ICD-10-CM | POA: Diagnosis not present

## 2018-03-13 DIAGNOSIS — Z7982 Long term (current) use of aspirin: Secondary | ICD-10-CM | POA: Diagnosis not present

## 2018-03-13 DIAGNOSIS — R42 Dizziness and giddiness: Secondary | ICD-10-CM | POA: Diagnosis not present

## 2018-03-13 DIAGNOSIS — I639 Cerebral infarction, unspecified: Secondary | ICD-10-CM | POA: Diagnosis not present

## 2018-03-13 DIAGNOSIS — I6389 Other cerebral infarction: Secondary | ICD-10-CM | POA: Diagnosis not present

## 2018-03-13 DIAGNOSIS — E669 Obesity, unspecified: Secondary | ICD-10-CM | POA: Diagnosis present

## 2018-03-13 DIAGNOSIS — Z8673 Personal history of transient ischemic attack (TIA), and cerebral infarction without residual deficits: Secondary | ICD-10-CM | POA: Diagnosis present

## 2018-03-13 DIAGNOSIS — H538 Other visual disturbances: Secondary | ICD-10-CM | POA: Diagnosis not present

## 2018-03-13 DIAGNOSIS — E7801 Familial hypercholesterolemia: Secondary | ICD-10-CM

## 2018-03-13 DIAGNOSIS — Z8619 Personal history of other infectious and parasitic diseases: Secondary | ICD-10-CM

## 2018-03-13 DIAGNOSIS — I10 Essential (primary) hypertension: Secondary | ICD-10-CM | POA: Diagnosis not present

## 2018-03-13 DIAGNOSIS — Z79899 Other long term (current) drug therapy: Secondary | ICD-10-CM | POA: Diagnosis not present

## 2018-03-13 DIAGNOSIS — I6300 Cerebral infarction due to thrombosis of unspecified precerebral artery: Secondary | ICD-10-CM

## 2018-03-13 DIAGNOSIS — R29701 NIHSS score 1: Secondary | ICD-10-CM | POA: Diagnosis not present

## 2018-03-13 DIAGNOSIS — Z791 Long term (current) use of non-steroidal anti-inflammatories (NSAID): Secondary | ICD-10-CM

## 2018-03-13 DIAGNOSIS — Z6833 Body mass index (BMI) 33.0-33.9, adult: Secondary | ICD-10-CM

## 2018-03-13 DIAGNOSIS — H54413A Blindness right eye category 3, normal vision left eye: Secondary | ICD-10-CM | POA: Diagnosis not present

## 2018-03-13 DIAGNOSIS — I6523 Occlusion and stenosis of bilateral carotid arteries: Secondary | ICD-10-CM | POA: Diagnosis not present

## 2018-03-13 DIAGNOSIS — H3411 Central retinal artery occlusion, right eye: Secondary | ICD-10-CM | POA: Diagnosis not present

## 2018-03-13 DIAGNOSIS — Z87891 Personal history of nicotine dependence: Secondary | ICD-10-CM

## 2018-03-13 DIAGNOSIS — H547 Unspecified visual loss: Secondary | ICD-10-CM | POA: Diagnosis present

## 2018-03-13 DIAGNOSIS — Z833 Family history of diabetes mellitus: Secondary | ICD-10-CM

## 2018-03-13 DIAGNOSIS — R0902 Hypoxemia: Secondary | ICD-10-CM | POA: Diagnosis not present

## 2018-03-13 HISTORY — DX: Central retinal artery occlusion, right eye: H34.11

## 2018-03-13 HISTORY — DX: Cerebral infarction, unspecified: I63.9

## 2018-03-13 LAB — APTT: aPTT: 38 seconds — ABNORMAL HIGH (ref 24–36)

## 2018-03-13 LAB — DIFFERENTIAL
Abs Immature Granulocytes: 0.03 10*3/uL (ref 0.00–0.07)
Basophils Absolute: 0.1 10*3/uL (ref 0.0–0.1)
Basophils Relative: 1 %
Eosinophils Absolute: 0.2 10*3/uL (ref 0.0–0.5)
Eosinophils Relative: 3 %
Immature Granulocytes: 0 %
Lymphocytes Relative: 29 %
Lymphs Abs: 2.6 10*3/uL (ref 0.7–4.0)
Monocytes Absolute: 1 10*3/uL (ref 0.1–1.0)
Monocytes Relative: 12 %
Neutro Abs: 4.9 10*3/uL (ref 1.7–7.7)
Neutrophils Relative %: 55 %

## 2018-03-13 LAB — COMPREHENSIVE METABOLIC PANEL
ALT: 19 U/L (ref 0–44)
ANION GAP: 10 (ref 5–15)
AST: 25 U/L (ref 15–41)
Albumin: 4.1 g/dL (ref 3.5–5.0)
Alkaline Phosphatase: 57 U/L (ref 38–126)
BUN: 19 mg/dL (ref 8–23)
CO2: 22 mmol/L (ref 22–32)
Calcium: 9.4 mg/dL (ref 8.9–10.3)
Chloride: 106 mmol/L (ref 98–111)
Creatinine, Ser: 1.24 mg/dL (ref 0.61–1.24)
GFR calc Af Amer: >60 mL/min (ref >60)
GFR calc non Af Amer: 57 mL/min — ABNORMAL LOW (ref >60)
Glucose, Bld: 104 mg/dL — ABNORMAL HIGH (ref 70–99)
POTASSIUM: 4 mmol/L (ref 3.5–5.1)
SODIUM: 138 mmol/L (ref 135–145)
Total Bilirubin: 0.8 mg/dL (ref 0.3–1.2)
Total Protein: 7 g/dL (ref 6.5–8.1)

## 2018-03-13 LAB — PROTIME-INR
INR: 1.03
Prothrombin Time: 13.4 seconds (ref 11.4–15.2)

## 2018-03-13 LAB — CBC
HCT: 44.2 % (ref 39.0–52.0)
Hemoglobin: 14.6 g/dL (ref 13.0–17.0)
MCH: 29.1 pg (ref 26.0–34.0)
MCHC: 33 g/dL (ref 30.0–36.0)
MCV: 88 fL (ref 80.0–100.0)
PLATELETS: 241 10*3/uL (ref 150–400)
RBC: 5.02 MIL/uL (ref 4.22–5.81)
RDW: 13.2 % (ref 11.5–15.5)
WBC: 8.9 10*3/uL (ref 4.0–10.5)
nRBC: 0 % (ref 0.0–0.2)

## 2018-03-13 LAB — I-STAT TROPONIN, ED: TROPONIN I, POC: 0.01 ng/mL (ref 0.00–0.08)

## 2018-03-13 LAB — MRSA PCR SCREENING: MRSA by PCR: NEGATIVE

## 2018-03-13 LAB — I-STAT CREATININE, ED: Creatinine, Ser: 1.1 mg/dL (ref 0.61–1.24)

## 2018-03-13 LAB — CBG MONITORING, ED: Glucose-Capillary: 90 mg/dL (ref 70–99)

## 2018-03-13 MED ORDER — ALTEPLASE (STROKE) FULL DOSE INFUSION
0.9000 mg/kg | Freq: Once | INTRAVENOUS | Status: AC
  Administered 2018-03-13: 82.9 mg via INTRAVENOUS
  Filled 2018-03-13: qty 100

## 2018-03-13 MED ORDER — ACETAMINOPHEN 650 MG RE SUPP: 650.0000 mg | RECTAL | Status: DC | PRN

## 2018-03-13 MED ORDER — ACETAMINOPHEN 160 MG/5ML PO SOLN: 650.0000 mg | ORAL | Status: DC | PRN

## 2018-03-13 MED ORDER — STROKE: EARLY STAGES OF RECOVERY BOOK
Freq: Once | Status: DC
  Filled 2018-03-13: qty 1

## 2018-03-13 MED ORDER — IOPAMIDOL (ISOVUE-370) INJECTION 76%
100.0000 mL | Freq: Once | INTRAVENOUS | Status: AC | PRN
  Administered 2018-03-13: 100 mL via INTRAVENOUS

## 2018-03-13 MED ORDER — SODIUM CHLORIDE 0.9 % IV SOLN
50.0000 mL | Freq: Once | INTRAVENOUS | Status: AC
  Administered 2018-03-13: 50 mL via INTRAVENOUS

## 2018-03-13 MED ORDER — SENNOSIDES-DOCUSATE SODIUM 8.6-50 MG PO TABS: 1.0000 | ORAL_TABLET | Freq: Every evening | ORAL | Status: DC | PRN

## 2018-03-13 MED ORDER — SODIUM CHLORIDE 0.9% FLUSH
3.0000 mL | Freq: Once | INTRAVENOUS | Status: AC
Start: 2018-03-13 — End: 2018-03-13
  Administered 2018-03-13: 3 mL via INTRAVENOUS

## 2018-03-13 MED ORDER — PANTOPRAZOLE SODIUM 40 MG IV SOLR
40.0000 mg | Freq: Every day | INTRAVENOUS | Status: DC
  Administered 2018-03-13 – 2018-03-14 (×2): 40 mg via INTRAVENOUS
  Filled 2018-03-13 (×2): qty 40

## 2018-03-13 MED ORDER — CLEVIDIPINE BUTYRATE 0.5 MG/ML IV EMUL: 0.0000 mg/h | INTRAVENOUS | Status: DC

## 2018-03-13 MED ORDER — ACETAMINOPHEN 325 MG PO TABS: 650.0000 mg | ORAL_TABLET | ORAL | Status: DC | PRN

## 2018-03-13 MED ORDER — SODIUM CHLORIDE 0.9 % IV SOLN
INTRAVENOUS | Status: DC
  Administered 2018-03-13 – 2018-03-14 (×2): via INTRAVENOUS

## 2018-03-13 MED ORDER — LABETALOL HCL 5 MG/ML IV SOLN: 20.0000 mg | Freq: Once | INTRAVENOUS | Status: DC

## 2018-03-13 NOTE — ED Triage Notes (Signed)
Per [facility], pt from home w/ a c/o right eye numbness and facial droop. Pt was LKW at 1400. He was doing yard work when the symptoms began. Pt denies headache or N/V. No weakness or arm drift. CAOx4.  170/98 --> 146/80 CBG 91 HR 85 Wt. 92.1 kg

## 2018-03-13 NOTE — Procedures (Signed)
Echo attempted. Nurse with patient and requested later attempt.

## 2018-03-13 NOTE — ED Notes (Signed)
Pt initially reported complete vision loss in right eye. Improvement in vision during and after TPA administration. Pt now reports partial vision loss in right eye - he has difficulty seeing peripheral vision in the left, upper field.

## 2018-03-13 NOTE — Code Documentation (Signed)
78 yo male coming from home where he was working in the yard. Pt was at his baselin at 1400. Started to notice a sudden onset of right eye blindness. Pt walked inside and told his daughter. Daughter called EMS. EMS activated a Code Stroke. Stroke Team met patient upon arrival at the bridge. NIHSS 1 due to slight facial droop, and patient noted to have right eye blindness. CT showed no hemorrhage. PIV placed 20 gauge in left FA. tPA intiated at 1522 with 8.3 mg bolus and remaining 74.6 ml over one hour. Pt is alert and oriented x4. No other deficits noted. Assessments completed per post-tPA protocol. Handoff given to [place], Therapist, sports.

## 2018-03-13 NOTE — H&P (Signed)
Admission H&P    Chief Complaint: code stroke HPI: Jon Mejia is an 78 y.o. male  With past medical history of hypertension who was brought in as a code stroke today. He developed sudden onset of pain in his right eye vision loss at 2 PM. He called 911 and patient was brought in as a code stroke. He stated that he had near total vision loss in the right eye and could barely see light perception. Denied any eye pain injury fall. He denied any speech difficulties, headache, focal extremity weakness and numbness. He had no prior history of strokes or TIAs. He had a full eye exam in December 2019 with no abnormalities found. CT scan of the head on admission was obtained which showed no acute abnormalities. Patient met criteria for administration of IV TPA after carefully reviewing the inclusion and exclusion and discussing risks and benefits with patient.  LSN: 2 PM on 01/11/19 tPA Given: Yes at 51  Past Medical History:  Diagnosis Date  . History of chicken pox   . History of measles     History reviewed. No pertinent surgical history.  Family History  Problem Relation Age of Onset  . Arrhythmia Mother        A-Fib  . Diabetes Son   . Colon cancer Neg Hx   . Prostate cancer Neg Hx    Social History:  reports that he quit smoking about 35 years ago. He has a 20.00 pack-year smoking history. He has never used smokeless tobacco. He reports that he does not drink alcohol or use drugs.  Allergies: No Known Allergies  (Not in a hospital admission)   ROS: 14 system review of system is positive only for loss of vision and all other systems negative  Physical Examination: Blood pressure 139/80, pulse 69, temperature 98.1 F (36.7 C), temperature source Oral, resp. rate (!) 22, height 5' 5"  (1.651 m), weight 92.1 kg, SpO2 97 %.  obese elderly Caucasian male not in distress. . Afebrile. Head is nontraumatic. Neck is supple without bruit.    Cardiac exam no murmur or gallop. Lungs are  clear to auscultation. Distal pulses are well felt.  Neurologic Examination: Awake alert oriented to time place and person. Normal speech and language. No dysarthria aphasia or apraxia. Extraocular movements are full range without nystagmus. Right pupil is 4 mm sluggishly reactive. Left is 4 mm briskly reactive. Vision acuity significantly diminished in the right eye with only light percent perception. Visual equity is normal in the left eye. Fundi were not visualized. Face is symmetric without weakness. Tongue midline. Motor system exam reveals symmetric upper and lower extremity strength without focal weakness. Deep tendon pulses are symmetric. Plantars are downgoing. Sensation is intact. Coordination is accurate. Gait not tested.  NIH stroke scale is 0 vision loss in the right eye is not on homonymous Baseline modified Rankin scale 0  Results for orders placed or performed during the hospital encounter of 03/13/18 (from the past 48 hour(s))  Protime-INR     Status: None   Collection Time: 03/13/18  3:09 PM  Result Value Ref Range   Prothrombin Time 13.4 11.4 - 15.2 seconds   INR 1.03     Comment: Performed at [facility] [address], [place], [place] [address]  APTT     Status: Abnormal   Collection Time: 03/13/18  3:09 PM  Result Value Ref Range   aPTT 38 (H) 24 - 36 seconds    Comment:  IF BASELINE aPTT IS ELEVATED, SUGGEST PATIENT RISK ASSESSMENT BE USED TO DETERMINE APPROPRIATE ANTICOAGULANT THERAPY. Performed at [facility], [place] [address], [place], [place] [address]   CBC     Status: None   Collection Time: 03/13/18  3:09 PM  Result Value Ref Range   WBC 8.9 4.0 - 10.5 K/uL   RBC 5.02 4.22 - 5.81 MIL/uL   Hemoglobin 14.6 13.0 - 17.0 g/dL   HCT 44.2 39.0 - 52.0 %   MCV 88.0 80.0 - 100.0 fL   MCH 29.1 26.0 - 34.0 pg   MCHC 33.0 30.0 - 36.0 g/dL   RDW 13.2 11.5 - 15.5 %   Platelets 241 150 - 400 K/uL   nRBC 0.0 0.0 - 0.2 %    Comment: Performed at  [facility], [place] [address] [place], [place] [address]  Differential     Status: None   Collection Time: 03/13/18  3:09 PM  Result Value Ref Range   Neutrophils Relative % 55 %   Neutro Abs 4.9 1.7 - 7.7 K/uL   Lymphocytes Relative 29 %   Lymphs Abs 2.6 0.7 - 4.0 K/uL   Monocytes Relative 12 %   Monocytes Absolute 1.0 0.1 - 1.0 K/uL   Eosinophils Relative 3 %   Eosinophils Absolute 0.2 0.0 - 0.5 K/uL   Basophils Relative 1 %   Basophils Absolute 0.1 0.0 - 0.1 K/uL   Immature Granulocytes 0 %   Abs Immature Granulocytes 0.03 0.00 - 0.07 K/uL    Comment: Performed at [facility], [place] [address] [place], [place] [address]  Comprehensive metabolic panel     Status: Abnormal   Collection Time: 03/13/18  3:09 PM  Result Value Ref Range   Sodium 138 135 - 145 mmol/L   Potassium 4.0 3.5 - 5.1 mmol/L   Chloride 106 98 - 111 mmol/L   CO2 22 22 - 32 mmol/L   Glucose, Bld 104 (H) 70 - 99 mg/dL   BUN 19 8 - 23 mg/dL   Creatinine, Ser 1.24 0.61 - 1.24 mg/dL   Calcium 9.4 8.9 - 10.3 mg/dL   Total Protein 7.0 6.5 - 8.1 g/dL   Albumin 4.1 3.5 - 5.0 g/dL   AST 25 15 - 41 U/L   ALT 19 0 - 44 U/L   Alkaline Phosphatase 57 38 - 126 U/L   Total Bilirubin 0.8 0.3 - 1.2 mg/dL   GFR calc non Af Amer 57 (L) >60 mL/min   GFR calc Af Amer >60 >60 mL/min   Anion gap 10 5 - 15    Comment: Performed at [facility] [address], [place], [place] [address]  CBG monitoring, ED     Status: None   Collection Time: 03/13/18  3:10 PM  Result Value Ref Range   Glucose-Capillary 90 70 - 99 mg/dL  I-stat troponin, ED     Status: None   Collection Time: 03/13/18  3:15 PM  Result Value Ref Range   Troponin i, poc 0.01 0.00 - 0.08 ng/mL   Comment 3            Comment: Due to the release kinetics of cTnI, a negative result within the first hours of the onset of symptoms does not rule out myocardial infarction with certainty. If myocardial infarction is still suspected, repeat the  test at appropriate intervals.   I-Stat Creatinine, ED (not at [facility])     Status: None  Collection Time: 03/13/18  3:17 PM  Result Value Ref Range   Creatinine, Ser 1.10 0.61 - 1.24 mg/dL   Ct Angio Head W Or Wo Contrast  Result Date: 03/13/2018 CLINICAL DATA:  Acute onset of right-sided blindness. EXAM: CT ANGIOGRAPHY HEAD AND NECK TECHNIQUE: Multidetector CT imaging of the head and neck was performed using the standard protocol during bolus administration of intravenous contrast. Multiplanar CT image reconstructions and MIPs were obtained to evaluate the vascular anatomy. Carotid stenosis measurements (when applicable) are obtained utilizing NASCET criteria, using the distal internal carotid diameter as the denominator. CONTRAST:  131m ISOVUE-370 IOPAMIDOL (ISOVUE-370) INJECTION 76% COMPARISON:  Head CT earlier same day FINDINGS: CTA NECK FINDINGS Aortic arch: Aortic atherosclerosis. No aneurysm or dissection. Branching pattern is normal. No flow limiting origin stenosis. Right carotid system: Common carotid artery shows mild plaque but is widely patent to the bifurcation region. There is soft and calcified plaque at the carotid bifurcation and ICA bulb. Minimal diameter in the distal bulb measures 3 mm. Compared to a more distal cervical ICA diameter of 4.3 mm, this indicates a 30% stenosis. Left carotid system: Common carotid artery is widely patent to the bifurcation. There is calcified plaque at the carotid bifurcation and ICA bulb. Minimal diameter in the distal bulb measures 4.5 mm. Compared to a more distal cervical ICA diameter of the same, there is no stenosis. Vertebral arteries: Right vertebral artery origin is widely patent. The right vertebral artery is widely patent through the cervical region to the foramen magnum. Left vertebral artery origin shows soft and calcified plaque with a 50% stenosis. Beyond the origin, the vessel is widely patent through the cervical region to the foramen  magnum. Skeleton: Ordinary cervical spondylosis. Other neck: No mass or lymphadenopathy. Upper chest: Mild upper lobe scarring. Review of the MIP images confirms the above findings CTA HEAD FINDINGS Anterior circulation: Both internal carotid arteries are patent through the skull base and siphon regions. There is atherosclerotic calcification in the carotid siphon regions but no stenosis. Ophthalmic arteries appeared show flow. Supraclinoid internal carotid arteries are widely patent. The anterior and middle cerebral vessels are patent without proximal stenosis. No large or medium vessel occlusion is identified. There is mild fusiform dilatation at the right MCA bifurcation but no definable aneurysm. More distal branch vessels do show some atherosclerotic irregularity. Posterior circulation: Both vertebral arteries are widely patent through the foramen magnum to the basilar. Early origin of left PICA. No basilar stenosis. Posterior circulation branch vessels are patent. There is some areas of atherosclerotic narrowing in the PCA branches but no flow limiting stenosis suspected. Venous sinuses: Patent and normal. Anatomic variants: None significant. Delayed phase: No abnormal enhancement. 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 On the right, there is soft and calcified plaque. Maximal stenosis in the right ICA bulb is 30%. No measurable stenosis on the left. 50% stenosis of the left vertebral artery origin. No intracranial large or medium vessel occlusion. Flow is present in both ophthalmic arteries. Mild fusiform dilatation at the right MCA bifurcation but no measurable berry aneurysm. Distal vessel atherosclerotic irregularity in the anterior, middle and posterior cerebral artery branches. Electronically Signed   By: MNelson ChimesM.D.   On: 03/13/2018 15:47   Ct Angio Neck W Or Wo Contrast  Result Date: 03/13/2018 CLINICAL DATA:   Acute onset of right-sided blindness. EXAM: CT ANGIOGRAPHY HEAD AND NECK TECHNIQUE: Multidetector CT imaging of the head and neck  was performed using the standard protocol during bolus administration of intravenous contrast. Multiplanar CT image reconstructions and MIPs were obtained to evaluate the vascular anatomy. Carotid stenosis measurements (when applicable) are obtained utilizing NASCET criteria, using the distal internal carotid diameter as the denominator. CONTRAST:  150m ISOVUE-370 IOPAMIDOL (ISOVUE-370) INJECTION 76% COMPARISON:  Head CT earlier same day FINDINGS: CTA NECK FINDINGS Aortic arch: Aortic atherosclerosis. No aneurysm or dissection. Branching pattern is normal. No flow limiting origin stenosis. Right carotid system: Common carotid artery shows mild plaque but is widely patent to the bifurcation region. There is soft and calcified plaque at the carotid bifurcation and ICA bulb. Minimal diameter in the distal bulb measures 3 mm. Compared to a more distal cervical ICA diameter of 4.3 mm, this indicates a 30% stenosis. Left carotid system: Common carotid artery is widely patent to the bifurcation. There is calcified plaque at the carotid bifurcation and ICA bulb. Minimal diameter in the distal bulb measures 4.5 mm. Compared to a more distal cervical ICA diameter of the same, there is no stenosis. Vertebral arteries: Right vertebral artery origin is widely patent. The right vertebral artery is widely patent through the cervical region to the foramen magnum. Left vertebral artery origin shows soft and calcified plaque with a 50% stenosis. Beyond the origin, the vessel is widely patent through the cervical region to the foramen magnum. Skeleton: Ordinary cervical spondylosis. Other neck: No mass or lymphadenopathy. Upper chest: Mild upper lobe scarring. Review of the MIP images confirms the above findings CTA HEAD FINDINGS Anterior circulation: Both internal carotid arteries are patent through the  skull base and siphon regions. There is atherosclerotic calcification in the carotid siphon regions but no stenosis. Ophthalmic arteries appeared show flow. Supraclinoid internal carotid arteries are widely patent. The anterior and middle cerebral vessels are patent without proximal stenosis. No large or medium vessel occlusion is identified. There is mild fusiform dilatation at the right MCA bifurcation but no definable aneurysm. More distal branch vessels do show some atherosclerotic irregularity. Posterior circulation: Both vertebral arteries are widely patent through the foramen magnum to the basilar. Early origin of left PICA. No basilar stenosis. Posterior circulation branch vessels are patent. There is some areas of atherosclerotic narrowing in the PCA branches but no flow limiting stenosis suspected. Venous sinuses: Patent and normal. Anatomic variants: None significant. Delayed phase: No abnormal enhancement. 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 On the right, there is soft and calcified plaque. Maximal stenosis in the right ICA bulb is 30%. No measurable stenosis on the left. 50% stenosis of the left vertebral artery origin. No intracranial large or medium vessel occlusion. Flow is present in both ophthalmic arteries. Mild fusiform dilatation at the right MCA bifurcation but no measurable berry aneurysm. Distal vessel atherosclerotic irregularity in the anterior, middle and posterior cerebral artery branches. Electronically Signed   By: MNelson ChimesM.D.   On: 03/13/2018 15:47   Ct Head Code Stroke Wo Contrast  Result Date: 03/13/2018 CLINICAL DATA:  Code stroke. Right-sided blindness. Last seen normal 1400 hours EXAM: CT HEAD WITHOUT CONTRAST TECHNIQUE: Contiguous axial images were obtained from the base of the skull through the vertex without intravenous contrast. COMPARISON:  None. FINDINGS: Brain: No evidence of  accelerated atrophy. The brainstem and cerebellum are normal. Minimal small vessel change of the cerebral hemispheric white matter. Mild asymmetric physiologic calcification in the left basal ganglia. No sign of acute infarction, mass lesion, hemorrhage, hydrocephalus or  extra-axial collection. Vascular: There is atherosclerotic calcification of the major vessels at the base of the brain. Skull: Negative Sinuses/Orbits: Clear/normal Other: None ASPECTS ([place] Stroke Program Early CT Score) - Ganglionic level infarction (caudate, lentiform nuclei, internal capsule, insula, M1-M3 cortex): 7 - Supraganglionic infarction (M4-M6 cortex): 3 Total score (0-10 with 10 being normal): 10 IMPRESSION: 1. No acute finding.  Essentially normal head CT for age. 2. ASPECTS is 10. 3. These results were communicated to Dr. Leonie Man at 3:25 pmon 2/14/2020by text page via the [facility] messaging system. Electronically Signed   By: Nelson Chimes M.D.   On: 03/13/2018 15:25    Assessment: 77 y.o. 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  Stroke Risk Factors - carotid stenosis, hypertension and obesity I had a long discussion with the patient and subsequently later with his wife and family regarding his sudden onset of vision loss and differential diagnosis and discussed treatment option with IV TPA and alternatives as well as risks benefits and risks of significant hemorrhage being 5-6%. Patient wished to proceed with tPA after discussion and understanding the risk. I also discussed the case with neuro intervention is Dr. Estanislado Pandy to see if any catheter-based treatment good benefit the patient's vision loss but after carefully removing CT angiogram images ophthalmic artery was found to be patent in the right eye and he was not felt to be candidate for endovascular treatment Plan:   admit to ICU for close neurological monitoring and strict blood pressure control as per post TPA protocol. Check MRI scan of the brain later as well as echocardiogram, lipid profile and hemoglobin A1c. Physical occupational therapy consults. Long discussion with the patient and wife and answered questions. This patient is critically ill and at significant risk of neurological worsening, death and care requires constant monitoring of vital signs, hemodynamics,respiratory and cardiac monitoring, extensive review of multiple databases, frequent neurological assessment, discussion with family, other specialists and medical decision making of high complexity.I have made any additions or clarifications directly to the above note.This critical care time does not reflect procedure time, or teaching time or supervisory time of PA/NP/Med Resident etc but could involve care discussion time.  I spent 60 minutes of neurocritical care time  in the care of  this patient.   Antony Contras, MD     03/13/2018, 4:46 PM

## 2018-03-13 NOTE — Progress Notes (Signed)
PHARMACIST CODE STROKE RESPONSE  Notified to mix tPA at 1517 by Dr. Leonie Man Delivered tPA to RN at 1520  tPA dose = 8.3 mg bolus over 1 minute followed by 74.6 mg for a total dose of 82.9 mg over 1 hour  Issues/delays encountered (if applicable): none  Harrietta Guardian, PharmD PGY1 Pharmacy Resident 03/13/2018    3:25 PM Please check AMION for all [place] numbers

## 2018-03-13 NOTE — ED Provider Notes (Signed)
[facility] Provider Note   CSN: [id number] Arrival date & time: 03/13/18  1507     History   Chief Complaint Chief Complaint  Patient presents with  . Code Stroke    HPI RAMONA SLINGER is a 78 y.o. male with PMH HTN presenting after sudden onset decreased vision while working outside earlier today around 2pm. He states he initially had complete vision loss but that he is now seeing shadows. He states this has never happened before. He denies numbness, tingling, or focal weakness. He denies headache.  His wife states he has been tired recently and last night was feeling dizzy.  Code stroke called on presentation and patient taken given alteplase.   HPI  Past Medical History:  Diagnosis Date  . History of chicken pox   . History of measles     Patient Active Problem List   Diagnosis Date Noted  . Stroke ([place]) 03/13/2018  . Hyperglycemia 01/26/2016  . 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 Back pain with left-sided radiculopathy 01/19/2015  . Syncope 10/21/2011  . Pure hypercholesterolemia 01/11/2008  . Essential (primary) hypertension 06/08/2007  . Premature atrial beats 01/05/2007    History reviewed. No pertinent surgical history.      Home Medications    Prior to Admission medications   Medication Sig Start Date End Date Taking? Authorizing Provider  amLODipine (NORVASC) 5 MG tablet Take 1 tablet (5 mg total) by mouth daily. 04/28/17  Yes [name], MD  aspirin 81 MG tablet Take 81 mg by mouth as needed for pain.    Yes [provider]  diclofenac sodium (VOLTAREN) 1 % GEL Apply 2 g topically 4 (four) times daily. Patient taking differently: Apply 2 g topically 4 (four) times daily as needed (hand pain).  01/10/17  Yes [name], MD  lovastatin (MEVACOR) 40 MG tablet TAKE ONE TABLET AT BEDTIME 11/06/17  Yes [name], MD  meloxicam (MOBIC) 15 MG tablet take 1 tablet by mouth once daily  after meals if needed for BACK PAIN Patient taking differently: Take 15 mg by mouth daily as needed (back pain).  12/20/16  Yes [name], MD  hydrochlorothiazide (HYDRODIURIL) 25 MG tablet TAKE ONE TABLET EVERY DAY Patient not taking: Reported on 03/13/2018 08/08/17   [name], MD    Family History Family History  Problem Relation Age of Onset  . Arrhythmia Mother        A-Fib  . Diabetes Son   . Colon cancer Neg Hx   . Prostate cancer Neg Hx     Social History Social History   Tobacco Use  . Smoking status: Former Smoker    Packs/day: 1.00    Years: 20.00    Pack years: 20.00    Last attempt to quit: 03/12/1983    Years since quitting: 35.0  . Smokeless tobacco: Never Used  Substance Use Topics  . Alcohol use: No  . Drug use: No     Allergies   Patient has no known allergies.   Review of Systems Review of Systems  ROS negative except as noted in HPI.    Physical Exam Updated Vital Signs BP (!) 148/85   Pulse 71   Temp 98.1 F (36.7 C) (Oral)   Resp 18   Ht 5\' 5"  (1.651 m)   Wt 92.1 kg   SpO2 95%   BMI 33.79 kg/m   Physical Exam Constitution: NAD, supine in bed  HENT: AT/[place]  Eyes: decreased vision right eye, eom intact, PERRLA, no injection or icterus  Cardio: RRR, no m/r/g  Respiratory: CTA, no wheezing rales or rhonchi  Abdominal: non-distended  MSK: moving all extremities, strength symmetrical bilateral UE and LE   Neuro: CN II-XII intact, normal affect, following commands  Skin: c/d/i    ED Treatments / Results  Labs (all labs ordered are listed, but only abnormal results are displayed) Labs Reviewed  APTT - Abnormal; Notable for the following components:      Result Value   aPTT 38 (*)    All other components within normal limits  COMPREHENSIVE METABOLIC PANEL - Abnormal; Notable for the following components:   Glucose, Bld 104 (*)    GFR calc non Af Amer 57 (*)    All other components within normal limits  PROTIME-INR    CBC  DIFFERENTIAL  HEMOGLOBIN A1C  LIPID PANEL  CBG MONITORING, ED  I-STAT CREATININE, ED  I-STAT TROPONIN, ED    EKG EKG Interpretation  Date/Time:  Friday March 13 2018 15:34:36 EST Ventricular Rate:  80 PR Interval:    QRS Duration: 87 QT Interval:  388 QTC Calculation: 448 R Axis:   75 Text Interpretation:  Sinus rhythm No previous ECGs available Confirmed by Theotis Burrow [phone]) on 03/13/2018 4:45:51 PM   Radiology Ct Angio Head W Or Wo Contrast  Result Date: 03/13/2018 CLINICAL DATA:  Acute onset of right-sided blindness. EXAM: CT ANGIOGRAPHY HEAD AND NECK TECHNIQUE: Multidetector CT imaging of the head and neck was performed using the standard protocol during bolus administration of intravenous contrast. Multiplanar CT image reconstructions and MIPs were obtained to evaluate the vascular anatomy. Carotid stenosis measurements (when applicable) are obtained utilizing NASCET criteria, using the distal internal carotid diameter as the denominator. CONTRAST:  144mL ISOVUE-370 IOPAMIDOL (ISOVUE-370) INJECTION 76% COMPARISON:  Head CT earlier same day FINDINGS: CTA NECK FINDINGS Aortic arch: Aortic atherosclerosis. No aneurysm or dissection. Branching pattern is normal. No flow limiting origin stenosis. Right carotid system: Common carotid artery shows mild plaque but is widely patent to the bifurcation region. There is soft and calcified plaque at the carotid bifurcation and ICA bulb. Minimal diameter in the distal bulb measures 3 mm. Compared to a more distal cervical ICA diameter of 4.3 mm, this indicates a 30% stenosis. Left carotid system: Common carotid artery is widely patent to the bifurcation. There is calcified plaque at the carotid bifurcation and ICA bulb. Minimal diameter in the distal bulb measures 4.5 mm. Compared to a more distal cervical ICA diameter of the same, there is no stenosis. Vertebral arteries: Right vertebral artery origin is widely patent. The right  vertebral artery is widely patent through the cervical region to the foramen magnum. Left vertebral artery origin shows soft and calcified plaque with a 50% stenosis. Beyond the origin, the vessel is widely patent through the cervical region to the foramen magnum. Skeleton: Ordinary cervical spondylosis. Other neck: No mass or lymphadenopathy. Upper chest: Mild upper lobe scarring. Review of the MIP images confirms the above findings CTA HEAD FINDINGS Anterior circulation: Both internal carotid arteries are patent through the skull base and siphon regions. There is atherosclerotic calcification in the carotid siphon regions but no stenosis. Ophthalmic arteries appeared show flow. Supraclinoid internal carotid arteries are widely patent. The anterior and middle cerebral vessels are patent without proximal stenosis. No large or medium vessel occlusion is identified. There is mild fusiform dilatation at the right MCA bifurcation but no definable aneurysm. More  distal branch vessels do show some atherosclerotic irregularity. Posterior circulation: Both vertebral arteries are widely patent through the foramen magnum to the basilar. Early origin of left PICA. No basilar stenosis. Posterior circulation branch vessels are patent. There is some areas of atherosclerotic narrowing in the PCA branches but no flow limiting stenosis suspected. Venous sinuses: Patent and normal. Anatomic variants: None significant. Delayed phase: No abnormal enhancement. 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 On the right, there is soft and calcified plaque. Maximal stenosis in the right ICA bulb is 30%. No measurable stenosis on the left. 50% stenosis of the left vertebral artery origin. No intracranial large or medium vessel occlusion. Flow is present in both ophthalmic arteries. Mild fusiform dilatation at the right MCA bifurcation but no measurable berry aneurysm.  Distal vessel atherosclerotic irregularity in the anterior, middle and posterior cerebral artery branches. Electronically Signed   By: Nelson Chimes M.D.   On: 03/13/2018 15:47   Ct Angio Neck W Or Wo Contrast  Result Date: 03/13/2018 CLINICAL DATA:  Acute onset of right-sided blindness. EXAM: CT ANGIOGRAPHY HEAD AND NECK TECHNIQUE: Multidetector CT imaging of the head and neck was performed using the standard protocol during bolus administration of intravenous contrast. Multiplanar CT image reconstructions and MIPs were obtained to evaluate the vascular anatomy. Carotid stenosis measurements (when applicable) are obtained utilizing NASCET criteria, using the distal internal carotid diameter as the denominator. CONTRAST:  118mL ISOVUE-370 IOPAMIDOL (ISOVUE-370) INJECTION 76% COMPARISON:  Head CT earlier same day FINDINGS: CTA NECK FINDINGS Aortic arch: Aortic atherosclerosis. No aneurysm or dissection. Branching pattern is normal. No flow limiting origin stenosis. Right carotid system: Common carotid artery shows mild plaque but is widely patent to the bifurcation region. There is soft and calcified plaque at the carotid bifurcation and ICA bulb. Minimal diameter in the distal bulb measures 3 mm. Compared to a more distal cervical ICA diameter of 4.3 mm, this indicates a 30% stenosis. Left carotid system: Common carotid artery is widely patent to the bifurcation. There is calcified plaque at the carotid bifurcation and ICA bulb. Minimal diameter in the distal bulb measures 4.5 mm. Compared to a more distal cervical ICA diameter of the same, there is no stenosis. Vertebral arteries: Right vertebral artery origin is widely patent. The right vertebral artery is widely patent through the cervical region to the foramen magnum. Left vertebral artery origin shows soft and calcified plaque with a 50% stenosis. Beyond the origin, the vessel is widely patent through the cervical region to the foramen magnum. Skeleton:  Ordinary cervical spondylosis. Other neck: No mass or lymphadenopathy. Upper chest: Mild upper lobe scarring. Review of the MIP images confirms the above findings CTA HEAD FINDINGS Anterior circulation: Both internal carotid arteries are patent through the skull base and siphon regions. There is atherosclerotic calcification in the carotid siphon regions but no stenosis. Ophthalmic arteries appeared show flow. Supraclinoid internal carotid arteries are widely patent. The anterior and middle cerebral vessels are patent without proximal stenosis. No large or medium vessel occlusion is identified. There is mild fusiform dilatation at the right MCA bifurcation but no definable aneurysm. More distal branch vessels do show some atherosclerotic irregularity. Posterior circulation: Both vertebral arteries are widely patent through the foramen magnum to the basilar. Early origin of left PICA. No basilar stenosis. Posterior circulation branch vessels are patent. There is some areas of atherosclerotic narrowing in the PCA branches but no flow limiting stenosis suspected. Venous sinuses:  Patent and normal. Anatomic variants: None significant. Delayed phase: No abnormal enhancement. 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 On the right, there is soft and calcified plaque. Maximal stenosis in the right ICA bulb is 30%. No measurable stenosis on the left. 50% stenosis of the left vertebral artery origin. No intracranial large or medium vessel occlusion. Flow is present in both ophthalmic arteries. Mild fusiform dilatation at the right MCA bifurcation but no measurable berry aneurysm. Distal vessel atherosclerotic irregularity in the anterior, middle and posterior cerebral artery branches. Electronically Signed   By: Nelson Chimes M.D.   On: 03/13/2018 15:47   Ct Head Code Stroke Wo Contrast  Result Date: 03/13/2018 CLINICAL DATA:  Code stroke.  Right-sided blindness. Last seen normal 1400 hours EXAM: CT HEAD WITHOUT CONTRAST TECHNIQUE: Contiguous axial images were obtained from the base of the skull through the vertex without intravenous contrast. COMPARISON:  None. FINDINGS: Brain: No evidence of accelerated atrophy. The brainstem and cerebellum are normal. Minimal small vessel change of the cerebral hemispheric white matter. Mild asymmetric physiologic calcification in the left basal ganglia. No sign of acute infarction, mass lesion, hemorrhage, hydrocephalus or extra-axial collection. Vascular: There is atherosclerotic calcification of the major vessels at the base of the brain. Skull: Negative Sinuses/Orbits: Clear/normal Other: None ASPECTS ([place] Stroke Program Early CT Score) - Ganglionic level infarction (caudate, lentiform nuclei, internal capsule, insula, M1-M3 cortex): 7 - Supraganglionic infarction (M4-M6 cortex): 3 Total score (0-10 with 10 being normal): 10 IMPRESSION: 1. No acute finding.  Essentially normal head CT for age. 2. ASPECTS is 10. 3. These results were communicated to Dr. Leonie Man at 3:25 pmon 2/14/2020by text page via the [facility] messaging system. Electronically Signed   By: Nelson Chimes M.D.   On: 03/13/2018 15:25    Procedures Procedures (including critical care time)  Medications Ordered in ED Medications   stroke: mapping our early stages of recovery book (has no administration in time range)  0.9 %  sodium chloride infusion (has no administration in time range)  acetaminophen (TYLENOL) tablet 650 mg (has no administration in time range)    Or  acetaminophen (TYLENOL) solution 650 mg (has no administration in time range)    Or  acetaminophen (TYLENOL) suppository 650 mg (has no administration in time range)  senna-docusate (Senokot-S) tablet 1 tablet (has no administration in time range)  pantoprazole (PROTONIX) injection 40 mg (has no administration in time range)  labetalol (NORMODYNE,TRANDATE) injection 20  mg (has no administration in time range)    And  clevidipine (CLEVIPREX) infusion 0.5 mg/mL (has no administration in time range)  sodium chloride flush (NS) 0.9 % injection 3 mL (3 mLs Intravenous Given 03/13/18 1625)  iopamidol (ISOVUE-370) 76 % injection 100 mL (100 mLs Intravenous Contrast Given 03/13/18 1526)  alteplase (ACTIVASE) 1 mg/mL infusion 82.9 mg (0 mg/kg  92.1 kg Intravenous Stopped 03/13/18 1622)    Followed by  0.9 %  sodium chloride infusion (0 mLs Intravenous Stopped 03/13/18 1712)     Initial Impression / Assessment and Plan / ED Course  I have reviewed the triage vital signs and the nursing notes.  Pertinent labs & imaging results that were available during my care of the patient were reviewed by me and considered in my medical decision making (see chart for details).     78yo male presenting with acute stroke symptoms with pain and loss of vision in his right eye. Code stroke called and  patient given alteplace. Some improvement in vision of right eye. Patient admitted to ICU per neurology for monitoring.   Final Clinical Impressions(s) / ED Diagnoses   Final diagnoses:  Cerebrovascular accident (CVA), unspecified mechanism [facility])    ED Discharge Orders    None       Isyss Espinal A, DO 03/13/18 1933    Little, Wenda Overland, MD 03/15/18 1119

## 2018-03-14 ENCOUNTER — Inpatient Hospital Stay (HOSPITAL_COMMUNITY): Payer: Medicare Other

## 2018-03-14 DIAGNOSIS — H3411 Central retinal artery occlusion, right eye: Secondary | ICD-10-CM

## 2018-03-14 DIAGNOSIS — I639 Cerebral infarction, unspecified: Secondary | ICD-10-CM

## 2018-03-14 HISTORY — DX: Cerebral infarction, unspecified: I63.9

## 2018-03-14 HISTORY — DX: Central retinal artery occlusion, right eye: H34.11

## 2018-03-14 LAB — ECHOCARDIOGRAM COMPLETE
Height: 65 in
Weight: 3135.82 oz

## 2018-03-14 LAB — LIPID PANEL
Cholesterol: 147 mg/dL (ref 0–200)
HDL: 33 mg/dL — ABNORMAL LOW (ref >40)
LDL Cholesterol: 96 mg/dL (ref 0–99)
Total CHOL/HDL Ratio: 4.5 RATIO
Triglycerides: 88 mg/dL (ref <150)
VLDL: 18 mg/dL (ref 0–40)

## 2018-03-14 MED ORDER — AMLODIPINE BESYLATE 5 MG PO TABS
5.0000 mg | ORAL_TABLET | Freq: Every day | ORAL | Status: DC
  Administered 2018-03-14 – 2018-03-15 (×2): 5 mg via ORAL
  Filled 2018-03-14 (×2): qty 1

## 2018-03-14 MED ORDER — PRAVASTATIN SODIUM 40 MG PO TABS
40.0000 mg | ORAL_TABLET | Freq: Every day | ORAL | Status: DC
  Administered 2018-03-14: 40 mg via ORAL
  Filled 2018-03-14: qty 1

## 2018-03-14 MED ORDER — HYDROCHLOROTHIAZIDE 25 MG PO TABS
25.0000 mg | ORAL_TABLET | Freq: Every day | ORAL | Status: DC
  Administered 2018-03-14 – 2018-03-15 (×2): 25 mg via ORAL
  Filled 2018-03-14 (×2): qty 1

## 2018-03-14 NOTE — Progress Notes (Signed)
STROKE TEAM PROGRESS NOTE  HPI: Jon Mejia is an 78 y.o. male  With past medical history of hypertension who was brought in as a code stroke today. He developed sudden onset of pain in his right eye vision loss at 2 PM. He called 911 and patient was brought in as a code stroke. He stated that he had near total vision loss in the right eye and could barely see light perception. Denied any eye pain injury fall. He denied any speech difficulties, headache, focal extremity weakness and numbness. He had no prior history of strokes or TIAs. He had a full eye exam in December 2019 with no abnormalities found. CT scan of the head on admission was obtained which showed no acute abnormalities. Patient met criteria for administration of IV TPA after carefully reviewing the inclusion and exclusion and discussing risks and benefits with patient. LSN: 2 PM on 01/11/19 tPA Given: Yes at 57  SUBJECTIVE  he states he is not is improvement in his right eye vision and can see clearly now to is a nasal field but still has blurred vision towards the center and lateral field of vision in the right eye. He has remained neurologically stable without headaches or other symptoms. His blood pressure has been adequately controlled. He has no complaints.  OBJECTIVE Vitals:   03/14/18 1100 03/14/18 1200  BP: 133/79 127/82  Pulse: 74 62  Resp: (!) 23 18  Temp:  98.6 F (37 C)  SpO2: 95% 94%     Physical Exam Pleasant elderly Caucasian male currently not in distress. . Afebrile. Head is nontraumatic. Neck is supple without bruit.    Cardiac exam no murmur or gallop. Lungs are clear to auscultation. Distal pulses are well felt. Neurological Exam ;  Awake  Alert oriented x 3. Normal speech and language.eye movements full without nystagmus.fundi were not visualized. Vision acuity diminished in the right eye but now able to see through the nasal quadrant. Right pupil 4 mm sluggishly reactive left 3 mm reactive   . Hearing  is normal. Palatal movements are normal. Face symmetric. Tongue midline. Normal strength, tone, reflexes and coordination. Normal sensation. Gait deferred.  Pertinent Laboratory Studies (past 3 days) / Diagnostics (past 24h) Recent Labs    03/13/18 1509 03/13/18 1517  WBC 8.9  --   HGB 14.6  --   PLT 241  --   NA 138  --   K 4.0  --   CREATININE 1.24 1.10  GLUCOSE 104*  --     Ct Angio Head W Or Wo Contrast  Result Date: 03/13/2018 CLINICAL DATA:  Acute onset of right-sided blindness. EXAM: CT ANGIOGRAPHY HEAD AND NECK TECHNIQUE: Multidetector CT imaging of the head and neck was performed using the standard protocol during bolus administration of intravenous contrast. Multiplanar CT image reconstructions and MIPs were obtained to evaluate the vascular anatomy. Carotid stenosis measurements (when applicable) are obtained utilizing NASCET criteria, using the distal internal carotid diameter as the denominator. CONTRAST:  153m ISOVUE-370 IOPAMIDOL (ISOVUE-370) INJECTION 76% COMPARISON:  Head CT earlier same day FINDINGS: CTA NECK FINDINGS Aortic arch: Aortic atherosclerosis. No aneurysm or dissection. Branching pattern is normal. No flow limiting origin stenosis. Right carotid system: Common carotid artery shows mild plaque but is widely patent to the bifurcation region. There is soft and calcified plaque at the carotid bifurcation and ICA bulb. Minimal diameter in the distal bulb measures 3 mm. Compared to a more distal cervical ICA diameter of 4.3 mm, this  indicates a 30% stenosis. Left carotid system: Common carotid artery is widely patent to the bifurcation. There is calcified plaque at the carotid bifurcation and ICA bulb. Minimal diameter in the distal bulb measures 4.5 mm. Compared to a more distal cervical ICA diameter of the same, there is no stenosis. Vertebral arteries: Right vertebral artery origin is widely patent. The right vertebral artery is widely patent through the cervical region  to the foramen magnum. Left vertebral artery origin shows soft and calcified plaque with a 50% stenosis. Beyond the origin, the vessel is widely patent through the cervical region to the foramen magnum. Skeleton: Ordinary cervical spondylosis. Other neck: No mass or lymphadenopathy. Upper chest: Mild upper lobe scarring. Review of the MIP images confirms the above findings CTA HEAD FINDINGS Anterior circulation: Both internal carotid arteries are patent through the skull base and siphon regions. There is atherosclerotic calcification in the carotid siphon regions but no stenosis. Ophthalmic arteries appeared show flow. Supraclinoid internal carotid arteries are widely patent. The anterior and middle cerebral vessels are patent without proximal stenosis. No large or medium vessel occlusion is identified. There is mild fusiform dilatation at the right MCA bifurcation but no definable aneurysm. More distal branch vessels do show some atherosclerotic irregularity. Posterior circulation: Both vertebral arteries are widely patent through the foramen magnum to the basilar. Early origin of left PICA. No basilar stenosis. Posterior circulation branch vessels are patent. There is some areas of atherosclerotic narrowing in the PCA branches but no flow limiting stenosis suspected. Venous sinuses: Patent and normal. Anatomic variants: None significant. Delayed phase: No abnormal enhancement. 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 On the right, there is soft and calcified plaque. Maximal stenosis in the right ICA bulb is 30%. No measurable stenosis on the left. 50% stenosis of the left vertebral artery origin. No intracranial large or medium vessel occlusion. Flow is present in both ophthalmic arteries. Mild fusiform dilatation at the right MCA bifurcation but no measurable berry aneurysm. Distal vessel atherosclerotic irregularity in the anterior,  middle and posterior cerebral artery branches. Electronically Signed   By: Nelson Chimes M.D.   On: 03/13/2018 15:47   Ct Angio Neck W Or Wo Contrast  Result Date: 03/13/2018 CLINICAL DATA:  Acute onset of right-sided blindness. EXAM: CT ANGIOGRAPHY HEAD AND NECK TECHNIQUE: Multidetector CT imaging of the head and neck was performed using the standard protocol during bolus administration of intravenous contrast. Multiplanar CT image reconstructions and MIPs were obtained to evaluate the vascular anatomy. Carotid stenosis measurements (when applicable) are obtained utilizing NASCET criteria, using the distal internal carotid diameter as the denominator. CONTRAST:  185m ISOVUE-370 IOPAMIDOL (ISOVUE-370) INJECTION 76% COMPARISON:  Head CT earlier same day FINDINGS: CTA NECK FINDINGS Aortic arch: Aortic atherosclerosis. No aneurysm or dissection. Branching pattern is normal. No flow limiting origin stenosis. Right carotid system: Common carotid artery shows mild plaque but is widely patent to the bifurcation region. There is soft and calcified plaque at the carotid bifurcation and ICA bulb. Minimal diameter in the distal bulb measures 3 mm. Compared to a more distal cervical ICA diameter of 4.3 mm, this indicates a 30% stenosis. Left carotid system: Common carotid artery is widely patent to the bifurcation. There is calcified plaque at the carotid bifurcation and ICA bulb. Minimal diameter in the distal bulb measures 4.5 mm. Compared to a more distal cervical ICA diameter of the same, there is no stenosis. Vertebral arteries: Right vertebral artery  origin is widely patent. The right vertebral artery is widely patent through the cervical region to the foramen magnum. Left vertebral artery origin shows soft and calcified plaque with a 50% stenosis. Beyond the origin, the vessel is widely patent through the cervical region to the foramen magnum. Skeleton: Ordinary cervical spondylosis. Other neck: No mass or  lymphadenopathy. Upper chest: Mild upper lobe scarring. Review of the MIP images confirms the above findings CTA HEAD FINDINGS Anterior circulation: Both internal carotid arteries are patent through the skull base and siphon regions. There is atherosclerotic calcification in the carotid siphon regions but no stenosis. Ophthalmic arteries appeared show flow. Supraclinoid internal carotid arteries are widely patent. The anterior and middle cerebral vessels are patent without proximal stenosis. No large or medium vessel occlusion is identified. There is mild fusiform dilatation at the right MCA bifurcation but no definable aneurysm. More distal branch vessels do show some atherosclerotic irregularity. Posterior circulation: Both vertebral arteries are widely patent through the foramen magnum to the basilar. Early origin of left PICA. No basilar stenosis. Posterior circulation branch vessels are patent. There is some areas of atherosclerotic narrowing in the PCA branches but no flow limiting stenosis suspected. Venous sinuses: Patent and normal. Anatomic variants: None significant. Delayed phase: No abnormal enhancement. 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 On the right, there is soft and calcified plaque. Maximal stenosis in the right ICA bulb is 30%. No measurable stenosis on the left. 50% stenosis of the left vertebral artery origin. No intracranial large or medium vessel occlusion. Flow is present in both ophthalmic arteries. Mild fusiform dilatation at the right MCA bifurcation but no measurable berry aneurysm. Distal vessel atherosclerotic irregularity in the anterior, middle and posterior cerebral artery branches. Electronically Signed   By: Nelson Chimes M.D.   On: 03/13/2018 15:47   Ct Head Code Stroke Wo Contrast  Result Date: 03/13/2018 CLINICAL DATA:  Code stroke. Right-sided blindness. Last seen normal 1400 hours EXAM: CT  HEAD WITHOUT CONTRAST TECHNIQUE: Contiguous axial images were obtained from the base of the skull through the vertex without intravenous contrast. COMPARISON:  None. FINDINGS: Brain: No evidence of accelerated atrophy. The brainstem and cerebellum are normal. Minimal small vessel change of the cerebral hemispheric white matter. Mild asymmetric physiologic calcification in the left basal ganglia. No sign of acute infarction, mass lesion, hemorrhage, hydrocephalus or extra-axial collection. Vascular: There is atherosclerotic calcification of the major vessels at the base of the brain. Skull: Negative Sinuses/Orbits: Clear/normal Other: None ASPECTS ([place] Stroke Program Early CT Score) - Ganglionic level infarction (caudate, lentiform nuclei, internal capsule, insula, M1-M3 cortex): 7 - Supraganglionic infarction (M4-M6 cortex): 3 Total score (0-10 with 10 being normal): 10 IMPRESSION: 1. No acute finding.  Essentially normal head CT for age. 2. ASPECTS is 10. 3. These results were communicated to Dr. Leonie Man at 3:25 pmon 2/14/2020by text page via the [facility] messaging system. Electronically Signed   By: Nelson Chimes M.D.   On: 03/13/2018 15:25     ASSESSMENT [name] is a 78 y.o. 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  He was treated with IV TPA as obtained some improvement in his right eye vision. He remains neurologically stable.     RECOMMENDATIONS Continue strict blood pressure control and close neurological monitoring. Mobilize out of bed.  Physical occupational therapy consults. Check echocardiogram and cardiac telemetry monitoring. Transfer to neurology floor bed later today. Likely discharge home in the next 1-2 days. Long discussion of the bedside with the patient and his wife and multiple family members and answered questions.  Start aspirin after MRI scan today.This patient is critically ill and at significant risk of neurological worsening, death and care requires constant monitoring of vital signs, hemodynamics,respiratory and cardiac monitoring, extensive review of multiple databases, frequent neurological assessment, discussion with family, other specialists and medical decision making of high complexity.I have made any additions or clarifications directly to the above note.This critical care time does not reflect procedure time, or teaching time or supervisory time of PA/NP/Med Resident etc but could involve care discussion time.  I spent 30 minutes of neurocritical care time  in the care of  this patient.       Hospital day # [place], MD [place] for Schedule & Pager information 03/14/2018 12:57 PM    To contact Stroke Continuity provider, please refer to [email or web address] After hours, contact General Neurology

## 2018-03-14 NOTE — Progress Notes (Signed)
*  PRELIMINARY RESULTS* Echocardiogram 2D Echocardiogram has been performed.  Jon Mejia 03/14/2018, 10:33 AM

## 2018-03-14 NOTE — Progress Notes (Signed)
PT Cancellation Note  Patient Details Name: Jon Mejia MRN: [id number] DOB: 14-Jan-1941   Cancelled Treatment:    Reason Eval/Treat Not Completed: PT screened, no needs identified, will sign off. Discussed pt case with Dr. Leonie Man who reports PT services are not warranted this time, and to sign off. Dr. Leonie Man states pt has no gait deficits and his only deficit is visual. Will sign off at this time per MD request. If needs change, please reconsult.    [name] 03/14/2018, 12:44 PM   Rolinda Roan, PT, DPT Acute Rehabilitation Services Pager: [phone] Office: [phone]

## 2018-03-14 NOTE — Progress Notes (Signed)
PT Cancellation Note  Patient Details Name: TAREEK SABO MRN: [id number] DOB: 02/10/1940   Cancelled Treatment:    Reason Eval/Treat Not Completed: Active bedrest order. Will continue to follow for medical readiness to participate in PT evaluation.   [name] 03/14/2018, 7:20 AM   Rolinda Roan, PT, DPT Acute Rehabilitation Services Pager: [phone] Office: [phone]

## 2018-03-14 NOTE — Progress Notes (Signed)
Pt arrived to unit, aox4 

## 2018-03-15 MED ORDER — ASPIRIN EC 81 MG PO TBEC
81.0000 mg | DELAYED_RELEASE_TABLET | Freq: Every day | ORAL | Status: DC
  Administered 2018-03-15: 81 mg via ORAL
  Filled 2018-03-15: qty 1

## 2018-03-15 MED ORDER — ASPIRIN 81 MG PO TBEC: 81.0000 mg | DELAYED_RELEASE_TABLET | Freq: Every day | ORAL | Status: DC

## 2018-03-15 MED ORDER — ATORVASTATIN CALCIUM 40 MG PO TABS: 40.0000 mg | ORAL_TABLET | Freq: Every day | ORAL | Status: DC

## 2018-03-15 MED ORDER — CLOPIDOGREL BISULFATE 75 MG PO TABS: 75.0000 mg | ORAL_TABLET | Freq: Every day | ORAL | 0 refills | Status: DC

## 2018-03-15 MED ORDER — ATORVASTATIN CALCIUM 40 MG PO TABS: 40.0000 mg | ORAL_TABLET | Freq: Every day | ORAL | 11 refills | Status: DC

## 2018-03-15 MED ORDER — PANTOPRAZOLE SODIUM 40 MG PO TBEC
40.0000 mg | DELAYED_RELEASE_TABLET | Freq: Every day | ORAL | Status: DC
  Administered 2018-03-15: 40 mg via ORAL
  Filled 2018-03-15: qty 1

## 2018-03-15 MED ORDER — CLOPIDOGREL BISULFATE 75 MG PO TABS
75.0000 mg | ORAL_TABLET | Freq: Every day | ORAL | Status: DC
  Administered 2018-03-15: 75 mg via ORAL
  Filled 2018-03-15: qty 1

## 2018-03-15 NOTE — Discharge Instructions (Signed)
Alteplase Treatment for Ischemic Stroke  Alteplase is a medicine that can dissolve blood clots. An ischemic stroke is caused by clots that block blood flow to the brain. Alteplase can help treat a stroke if it is given very shortly after stroke symptoms begin. Before giving this medicine, your doctor will decide if the medicine may work for you based on:  Your age.  Your condition.  Other factors. Tell your doctor about:  Any allergies you have.  All medicines you are taking.  Any blood disorders you have.  Any medical conditions you have.  Any bleeding in the last month, including stomach or vaginal bleeding.  Any surgeries or procedures you have had.  Whether you are pregnant or may be pregnant.  When your symptoms started. What are the risks? Generally, this is a safe treatment. However, problems may happen, including:  Bleeding.  Allergic reactions to the medicine. What happens before the procedure?  Your doctor will do an exam.  Your doctor will check your: ? Blood pressure. ? Heart rate. ? Breathing.  Medicines may be given to adjust your blood pressure, if needed.  You may have tests, such as: ? Blood tests. ? A head scan (CT scan). What happens during the procedure?  An IV tube will be put into one of your veins.  Alteplase will be given to you through the IV tube.  In some cases, this medicine may be given directly to the affected area through a thin tube (catheter). This is usually put in at the top of your leg.  Your health care team will closely watch your: ? Blood pressure. ? Heart rate. ? Breathing.  Your doctor will check often to see how well the medicine is working.  If the medicine causes bleeding, it will be stopped. Another treatment will be started. What happens after the procedure?  You will be watched closely in the ICU or the stroke unit.  You will be assessed by several specialists.  If you had a catheter, you may  have: ? Bruising. ? Soreness. ? Swelling.  It may take days, weeks, or months to fully see how well your body responded to the treatment. Follow these instructions in the hospital: Medicines  Take over-the-counter and prescription medicines only as told by your doctor. Activity  Do not get out of bed without help. This is for your safety.  Limit activity after your treatment.  Do stroke rehab programs as told by your doctor. General instructions  Tell a nurse or doctor right away if you have any bleeding, bruising or injuries.  Use a soft-bristled toothbrush. Brush your teeth gently. Get help right away if you have:  Blood in your vomit, stool, or urine.  A serious fall or accident, or you hit your head.  Symptoms of an allergic reaction such as rash or difficulty breathing.  Any signs of a stroke. "BE FAST" is an easy way to remember the main warning signs. ? B - Balance. Signs are dizziness, sudden trouble walking, or loss of balance. ? E - Eyes. Signs are trouble seeing or a sudden change in how you see. ? F - Face. Signs are sudden weakness or loss of feeling in the face, or the face or eyelid drooping on one side. ? A - Arms. Signs are weakness or loss of feeling in an arm. This happens suddenly and usually on one side of the body. ? S - Speech. Signs are sudden trouble speaking, slurred speech, or trouble  understanding what people say. ? T - Time. Time to call emergency services. Write down what time symptoms started.  Other signs of a stroke. This may include: ? A sudden, very bad headache with no known cause. ? Feeling sick to your stomach (nausea). ? Throwing up. ? Uncontrolled, jerky movements (seizure). These symptoms may represent a serious problem that is an emergency. Do not wait to see if the symptoms will go away. Get medical help right away.  Summary  Alteplase is a medicine that can break up blood clots. Blood clots can cause a stroke.  This medicine  may help if you get it as soon as possible after your stroke symptoms start.  Get help right away if you are showing signs of increased bleeding or are having symptoms of stroke. This information is not intended to replace advice given to you by your health care provider. Make sure you discuss any questions you have with your health care provider. Document Released: 05/01/2010 Document Revised: 02/12/2017 Document Reviewed: 02/12/2017 Elsevier Interactive Patient Education  2019 Elsevier Inc.   Cholesterol  Cholesterol is a fat. Your body needs a small amount of cholesterol. Cholesterol (plaque) may build up in your blood vessels (arteries). That makes you more likely to have a heart attack or stroke. You cannot feel your cholesterol level. Having a blood test is the only way to find out if your level is high. Keep your test results. Work with your doctor to keep your cholesterol at a good level. What do the results mean?  Total cholesterol is how much cholesterol is in your blood.  LDL is bad cholesterol. This is the type that can build up. Try to have low LDL.  HDL is good cholesterol. It cleans your blood vessels and carries LDL away. Try to have high HDL.  Triglycerides are fat that the body can store or burn for energy. What are good levels of cholesterol?  Total cholesterol below 200.  LDL below 100 is good for people who have health risks. LDL below 70 is good for people who have very high risks.  HDL above 40 is good. It is best to have HDL of 60 or higher.  Triglycerides below 150. How can I lower my cholesterol? Diet Follow your diet program as told by your doctor.  Choose fish, white meat chicken, or [place] that is roasted or baked. Try not to eat red meat, fried foods, sausage, or lunch meats.  Eat lots of fresh fruits and vegetables.  Choose whole grains, beans, pasta, potatoes, and cereals.  Choose olive oil, corn oil, or canola oil. Only use small  amounts.  Try not to eat butter, mayonnaise, shortening, or palm kernel oils.  Try not to eat foods with trans fats.  Choose low-fat or nonfat dairy foods. ? Drink skim or nonfat milk. ? Eat low-fat or nonfat yogurt and cheeses. ? Try not to drink whole milk or cream. ? Try not to eat ice cream, egg yolks, or full-fat cheeses.  Healthy desserts include angel food cake, ginger snaps, animal crackers, hard candy, popsicles, and low-fat or nonfat frozen yogurt. Try not to eat pastries, cakes, pies, and cookies.  Exercise Follow your exercise program as told by your doctor.  Be more active. Try gardening, walking, and taking the stairs.  Ask your doctor about ways that you can be more active. Medicine  Take over-the-counter and prescription medicines only as told by your doctor. This information is not intended to replace advice given  to you by your health care provider. Make sure you discuss any questions you have with your health care provider. Document Released: 04/12/2008 Document Revised: 08/16/2015 Document Reviewed: 07/27/2015 Elsevier Interactive Patient Education  2019 Elsevier Inc. 1.  Take aspirin 81 mg daily with Plavix 75 mg daily for 3 weeks and then aspirin 81 mg daily alone indefinitely. 2.  Let your primary care physician know if there are any signs of bleeding. 3.  You are not to drive until cleared by a physician. 4.  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  You can discuss this with your primary care physician. 5.  Because of your stroke it is recommended that your LDL be less than 70.  For that reason your Pravachol was changed to Lipitor.

## 2018-03-15 NOTE — Evaluation (Signed)
Speech Language Pathology Evaluation Patient Details Name: Jon Mejia MRN: [id number] DOB: 1940-12-24 Today's Date: 03/15/2018 Time: 7673-4193 SLP Time Calculation (min) (ACUTE ONLY): 23 min  Problem List:  Patient Active Problem List   Diagnosis Date Noted  . Stroke ([place]) 03/13/2018  . Hyperglycemia 01/26/2016  . 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 Back pain with left-sided radiculopathy 01/19/2015  . Syncope 10/21/2011  . Pure hypercholesterolemia 01/11/2008  . Essential (primary) hypertension 06/08/2007  . Premature atrial beats 01/05/2007   Past Medical History:  Past Medical History:  Diagnosis Date  . History of chicken pox   . History of measles    Past Surgical History: History reviewed. No pertinent surgical history. HPI:  Jon Mejia is an 78 y.o. male with past medical history of hypertension who was brought in as a code stroke today. He developed sudden onset of pain in his right eye with vision loss at 2 PM.   He stated that he had near total vision loss in the right eye and could barely see light perception. Denied any eye pain, injury or fall. He denied any speech difficulties, headache, focal extremity weakness and numbness. He had no prior history of strokes or TIAs. He had a full eye exam in December 2019 with no abnormalities found. CT scan of the head on admission was obtained which showed no acute abnormalities. Patient met criteria for administration of IV TPA after carefully reviewing the inclusion and exclusion and discussing risks and benefits with patient.  MRI was showing snall punctate infarct in the posterior right frontal lobe and small chronic cerebellar infarct.     Assessment / Plan / Recommendation Clinical Impression  Cognitive/linguistic and motor speech evaluation was completed using the Mini Mental State Examination and informal assessment.  The patient reported he was not aware of any changes to his thinking or speaking skills.   Speech was clear and easy to understand.  No discernible dysarthria or apraxia was noted.  He achieved a score of 28/30 on the Mini Mental State Examination suggesting functional cognitive/linguistic skills.  He was oriented x4 and had good insight into his current hospitalization.  He had good immediate and delayed recall of 3 novel words.   He was able to name objects, repeat a sentence, follow a 3 step command, read/comprehend a sentence, write a short sentence and copy a design.  He was able to provide logical solutions to simple problems and complete a clock drawing task.  The only difficulty he had was with the attention/calculation task.  Given performance on this current evaluation ST needs are not identified.      SLP Assessment  SLP Recommendation/Assessment: Patient does not need any further Speech Lanaguage Pathology Services SLP Visit Diagnosis: Cognitive communication deficit (R41.841)    Follow Up Recommendations  None          SLP Evaluation Cognition  Overall Cognitive Status: Within Functional Limits for tasks assessed Arousal/Alertness: Awake/alert Orientation Level: Oriented X4 Attention: Focused Focused Attention: Appears intact Memory: Appears intact Awareness: Appears intact Problem Solving: Appears intact Safety/Judgment: Appears intact       Comprehension  Auditory Comprehension Overall Auditory Comprehension: Appears within functional limits for tasks assessed Commands: Within Functional Limits Conversation: Complex Reading Comprehension Reading Status: Within funtional limits    Expression Expression Primary Mode of Expression: Verbal Verbal Expression Overall Verbal Expression: Appears within functional limits for tasks assessed Initiation: No impairment Automatic Speech: Name;Social Response Level of Generative/Spontaneous Verbalization: Conversation Repetition: No impairment  Naming: No impairment Pragmatics: No impairment Written  Expression Dominant Hand: Left Written Expression: Within Functional Limits   Oral / Motor  Motor Speech Overall Motor Speech: Appears within functional limits for tasks assessed Respiration: Within functional limits Phonation: Normal Resonance: Within functional limits Articulation: Within functional limitis Intelligibility: Intelligible Motor Planning: Witnin functional limits Motor Speech Errors: Not applicable   GO                    Shelly Flatten, MA, CCC-SLP Acute Rehab SLP [phone]  Lamar Sprinkles 03/15/2018, 2:37 PM

## 2018-03-15 NOTE — Evaluation (Addendum)
Occupational Therapy Evaluation Patient Details Name: Jon Mejia MRN: [id number] DOB: Jun 12, 1940 Today's Date: 03/15/2018    History of Present Illness This 78 y.o. male admitted with sudden loss of vision Rt eye.  tPA was administered. MRI  showed punctate acute infarct posterior Rt frontal lobe, and small chronic cerebellar infarct.  PMH includes: PMH includes:  mild aortic stenosis.  HTN   Clinical Impression   Patient evaluated by Occupational Therapy with no further acute OT needs identified. All education has been completed and the patient has no further questions. Pt is able to perform ADLs independently.  He demonstrates/reports blurriness Lt superior quadrant Rt eye which does not impact him functionally - he is able to read with Rt eye and bil. Eyes with his glasses, depth perception is intact.  Reviewed BEFAST and need to follow up with ophthalmologist.  Pt and family verbalize understanding. See below for any follow-up Occupational Therapy or equipment needs. OT is signing off. Thank you for this referral.  I have discussed the patient's current level of function related to current level of function  with the patient and wife.  They acknowledge understanding of this and feel they can provide the level of care the patient will need at home.           Follow Up Recommendations  No OT follow up    Equipment Recommendations  None recommended by OT    Recommendations for Other Services       Precautions / Restrictions Precautions Precautions: None      Mobility Bed Mobility Overal bed mobility: Independent                Transfers Overall transfer level: Independent                    Balance Overall balance assessment: Independent;No apparent balance deficits (not formally assessed)                                         ADL either performed or assessed with clinical judgement   ADL Overall ADL's : Independent                                              Vision Baseline Vision/History: Wears glasses Wears Glasses: At all times Patient Visual Report: Blurring of vision;Peripheral vision impairment(Rt eye ) Vision Assessment?: Yes Eye Alignment: Within Functional Limits Ocular Range of Motion: Within Functional Limits Alignment/Gaze Preference: Within Defined Limits Tracking/Visual Pursuits: Able to track stimulus in all quads without difficulty Saccades: Within functional limits Convergence: Within functional limits Visual Fields: Right visual field deficit(of Rt eye - ) Additional Comments: Pt able to detect stimuli in all visual fields and able to count fingers in all visual fields, but does report blurriness in temporal, superior quadrant of Rt eye.  He is able to read information with Rt eye and no difficulties, and able to read with bil. eyes and no difficulties.  Able to locate items in room and environment with no difficulties, and depth appears intact.  Discussed need to follow up with ophthalmologist at discharge, and he reports understanding of this      Perception Perception Perception Tested?: Yes   Praxis Praxis Praxis tested?: Within functional limits  Pertinent Vitals/Pain Pain Assessment: No/denies pain     Hand Dominance Right   Extremity/Trunk Assessment Upper Extremity Assessment Upper Extremity Assessment: Overall WFL for tasks assessed   Lower Extremity Assessment Lower Extremity Assessment: Overall WFL for tasks assessed   Cervical / Trunk Assessment Cervical / Trunk Assessment: Normal   Communication Communication Communication: No difficulties   Cognition Arousal/Alertness: Awake/alert Behavior During Therapy: WFL for tasks assessed/performed Overall Cognitive Status: Within Functional Limits for tasks assessed                                     General Comments  reviewed BEFAST with pt and family (daughter works for EMS and is  very Investment banker, corporate with this info)    Exercises     Shoulder Instructions      Home Living Family/patient expects to be discharged to:: Private residence Living Arrangements: Spouse/significant other Available Help at Discharge: Family;Available PRN/intermittently Type of Home: House             Bathroom Shower/Tub: Tub/shower unit;Curtain   Bathroom Toilet: Standard     Home Equipment: None          Prior Functioning/Environment Level of Independence: Independent        Comments: Pt reports he is fully independent including driving         OT Problem List: Impaired vision/perception      OT Treatment/Interventions:      OT Goals(Current goals can be found in the care plan section) Acute Rehab OT Goals Patient Stated Goal: to get home  OT Goal Formulation: All assessment and education complete, DC therapy  OT Frequency:     Barriers to D/C:            Co-evaluation              AM-PAC OT "6 Clicks" Daily Activity     Outcome Measure Help from another person eating meals?: None Help from another person taking care of personal grooming?: None Help from another person toileting, which includes using toliet, bedpan, or urinal?: None Help from another person bathing (including washing, rinsing, drying)?: None Help from another person to put on and taking off regular upper body clothing?: None Help from another person to put on and taking off regular lower body clothing?: None 6 Click Score: 24   End of Session    Activity Tolerance: Patient tolerated treatment well Patient left: in bed;with call bell/phone within reach;with family/visitor present  OT Visit Diagnosis: Low vision, both eyes (H54.2)                Time: 1610-9604 OT Time Calculation (min): 22 min Charges:  OT General Charges $OT Visit: 1 Visit OT Evaluation $OT Eval Low Complexity: 1 Low  Lucille Passy, OTR/L Acute Rehabilitation Services Pager [phone] Office  [phone]   Lucille Passy M 03/15/2018, 11:43 AM

## 2018-03-15 NOTE — Discharge Summary (Addendum)
Patient ID: Jon Mejia   MRN: [id number]      DOB: Feb 20, 1940  Date of Admission: 03/13/2018 Date of Discharge: 03/15/2018  Attending Physician:  Jon Fila, MD, Stroke MD Consultant(s):    None  Patient's PCP:  Jon Sons, MD  DISCHARGE DIAGNOSIS: . Right central retinal artery occlusion.etiology undeterminedstatus post treatment with IV TPA with partial improvement Active Problems:   Stroke [facility])   Past Medical History:  Diagnosis Date  . History of chicken pox   . History of measles    History reviewed. No pertinent surgical history.  Allergies as of 03/15/2018   No Known Allergies     Medication List    STOP taking these medications   aspirin 81 MG tablet Replaced by:  aspirin 81 MG EC tablet   hydrochlorothiazide 25 MG tablet Commonly known as:  HYDRODIURIL   lovastatin 40 MG tablet Commonly known as:  MEVACOR     TAKE these medications   amLODipine 5 MG tablet Commonly known as:  NORVASC Take 1 tablet (5 mg total) by mouth daily.   aspirin 81 MG EC tablet Take 1 tablet (81 mg total) by mouth daily. Start taking on:  March 16, 2018 Replaces:  aspirin 81 MG tablet   atorvastatin 40 MG tablet Commonly known as:  LIPITOR Take 1 tablet (40 mg total) by mouth daily at 6 PM.   clopidogrel 75 MG tablet Commonly known as:  PLAVIX Take 1 tablet (75 mg total) by mouth daily.   diclofenac sodium 1 % Gel Commonly known as:  VOLTAREN Apply 2 g topically 4 (four) times daily. What changed:    when to take this  reasons to take this   meloxicam 15 MG tablet Commonly known as:  MOBIC take 1 tablet by mouth once daily after meals if needed for BACK PAIN What changed:  See the new instructions.       HOME MEDICATIONS PRIOR TO ADMISSION Medications Prior to Admission  Medication Sig Dispense Refill  . amLODipine (NORVASC) 5 MG tablet Take 1 tablet (5 mg total) by mouth daily. 30 tablet 12  . aspirin 81 MG tablet Take 81 mg by mouth  as needed for pain.     Marland Kitchen diclofenac sodium (VOLTAREN) 1 % GEL Apply 2 g topically 4 (four) times daily. (Patient taking differently: Apply 2 g topically 4 (four) times daily as needed (hand pain). ) 100 g 2  . lovastatin (MEVACOR) 40 MG tablet TAKE ONE TABLET AT BEDTIME 90 tablet 4  . meloxicam (MOBIC) 15 MG tablet take 1 tablet by mouth once daily after meals if needed for BACK PAIN (Patient taking differently: Take 15 mg by mouth daily as needed (back pain). ) 30 tablet 2  . hydrochlorothiazide (HYDRODIURIL) 25 MG tablet TAKE ONE TABLET EVERY DAY (Patient not taking: Reported on 03/13/2018) 30 tablet 11     HOSPITAL MEDICATIONS .  stroke: mapping our early stages of recovery book   Does not apply Once  . amLODipine  5 mg Oral Daily  . aspirin EC  81 mg Oral Daily  . atorvastatin  40 mg Oral q1800  . clopidogrel  75 mg Oral Daily  . hydrochlorothiazide  25 mg Oral Daily  . pantoprazole  40 mg Oral Daily    LABORATORY STUDIES CBC    Component Value Date/Time   WBC 8.9 03/13/2018 1509   RBC 5.02 03/13/2018 1509   HGB 14.6 03/13/2018 1509   HGB  14.9 01/25/2016 0940   HCT 44.2 03/13/2018 1509   HCT 43.7 01/25/2016 0940   PLT 241 03/13/2018 1509   PLT 295 01/25/2016 0940   MCV 88.0 03/13/2018 1509   MCV 87 01/25/2016 0940   MCH 29.1 03/13/2018 1509   MCHC 33.0 03/13/2018 1509   RDW 13.2 03/13/2018 1509   RDW 14.3 01/25/2016 0940   LYMPHSABS 2.6 03/13/2018 1509   MONOABS 1.0 03/13/2018 1509   EOSABS 0.2 03/13/2018 1509   BASOSABS 0.1 03/13/2018 1509   CMP    Component Value Date/Time   NA 138 03/13/2018 1509   NA 139 12/03/2017 0845   K 4.0 03/13/2018 1509   CL 106 03/13/2018 1509   CO2 22 03/13/2018 1509   GLUCOSE 104 (H) 03/13/2018 1509   BUN 19 03/13/2018 1509   BUN 17 12/03/2017 0845   CREATININE 1.10 03/13/2018 1517   CREATININE 1.10 01/09/2017 0906   CALCIUM 9.4 03/13/2018 1509   PROT 7.0 03/13/2018 1509   PROT 7.0 04/29/2017 1001   ALBUMIN 4.1 03/13/2018  1509   ALBUMIN 4.6 12/03/2017 0845   AST 25 03/13/2018 1509   ALT 19 03/13/2018 1509   ALKPHOS 57 03/13/2018 1509   BILITOT 0.8 03/13/2018 1509   BILITOT 0.6 04/29/2017 1001   GFRNONAA 57 (L) 03/13/2018 1509   GFRNONAA 66 01/09/2017 0906   GFRAA >60 03/13/2018 1509   GFRAA 76 01/09/2017 0906   COAGS Lab Results  Component Value Date   INR 1.03 03/13/2018   Lipid Panel    Component Value Date/Time   CHOL 147 03/14/2018 0711   CHOL 138 04/29/2017 1001   TRIG 88 03/14/2018 0711   HDL 33 (L) 03/14/2018 0711   HDL 41 04/29/2017 1001   CHOLHDL 4.5 03/14/2018 0711   VLDL 18 03/14/2018 0711   LDLCALC 96 03/14/2018 0711   LDLCALC 83 04/29/2017 1001   LDLCALC 119 (H) 01/09/2017 0906   HgbA1C  Lab Results  Component Value Date   HGBA1C 5.9 (H) 12/03/2017   Urinalysis No results found for: COLORURINE, APPEARANCEUR, LABSPEC, PHURINE, GLUCOSEU, HGBUR, BILIRUBINUR, KETONESUR, PROTEINUR, UROBILINOGEN, NITRITE, LEUKOCYTESUR Urine Drug Screen No results found for: LABOPIA, COCAINSCRNUR, LABBENZ, AMPHETMU, THCU, LABBARB  Alcohol Level No results found for: [facility]   SIGNIFICANT DIAGNOSTIC STUDIES  Ct Angio Head W Or Wo Contrast Ct Angio Neck W Or Wo Contrast 03/13/2018 IMPRESSION:  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 On the right, there is soft and calcified plaque. Maximal stenosis in the right ICA bulb is 30%. No measurable stenosis on the left. 50% stenosis of the left vertebral artery origin. No intracranial large or medium vessel occlusion. Flow is present in both ophthalmic arteries. Mild fusiform dilatation at the right MCA bifurcation but no measurable berry aneurysm. Distal vessel atherosclerotic irregularity in the anterior, middle and posterior cerebral artery branches.      Mr Brain Wo Contrast 03/14/2018  IMPRESSION:  1. Punctate acute infarct in the posterior right frontal lobe.  2. Small chronic cerebellar infarct.    Ct Head Code Stroke Wo  Contrast 03/13/2018 IMPRESSION:  1. No acute finding.  Essentially normal head CT for age.  2. ASPECTS is 10.    Transthoracic Echocardiogram  03/14/2018 IMPRESSIONS  1. The left ventricle has normal systolic function with an ejection fraction of 60-65%. The cavity size was normal. There is mildly increased left ventricular wall thickness. Left ventricular diastolic Doppler parameters are consistent with pseudonormalization Elevated left ventricular end-diastolic pressure No evidence of left ventricular regional  wall motion abnormalities.  2. The right ventricle has normal systolic function. The cavity was normal. There is no increase in right ventricular wall thickness.  3. Left atrial size was mildly dilated.  4. The mitral valve is degenerative. There is severe mitral annular calcification present.  5. The tricuspid valve is normal in structure.  6. The aortic valve is tricuspid Severely thickening of the aortic valve Severe calcifcation of the aortic valve. moderate stenosis of the aortic valve. AV Vmax: 260.00 cm/s, AV Mean Grad: 14.0 mmHg, AV Area (Vmax): 1.39 cm, LVOT/AV VTI ratio: 0.51.  7. The pulmonic valve was normal in structure.  8. No evidence of left ventricular regional wall motion abnormalities.  9. Right atrial pressure is estimated at 10 mmHg.       HISTORY OF PRESENT ILLNESS (from Dr Clydene Fake H&P on 03/13/2018) Jon Mejia is an 78 y.o. male  With past medical history of hypertension who was brought in as a code stroke today. He developed sudden onset of pain in his right eye with vision loss at 2 PM. He called 911 and patient was brought in as a code stroke. He stated that he had near total vision loss in the right eye and could barely see light perception. Denied any eye pain injury fall. He denied any speech difficulties, headache, focal extremity weakness and numbness. He had no prior history of strokes or TIAs. He had a full eye exam in December 2019 with no  abnormalities found. CT scan of the head on admission was obtained which showed no acute abnormalities. Patient met criteria for administration of IV TPA after carefully reviewing the inclusion and exclusion and discussing risks and benefits with patient.  LSN: 2 PM on 01/11/19 tPA Given: Yes at [place] [name] is a 78 y.o. male with with sudden onset of pain in his right eye followed by loss of vision in that eye likely due to central retinal artery occlusion. The etiology was likely an atheroembolism. He was treated with IV TPA and obtained some improvement in his right eye vision. He remained neurologically stable.  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  Consistent with an embolic etiology.  A CTA of the head and neck did not reveal any significant stenosis.  An EKG and telemetry strips showed sinus rhythm without evidence of atrial fibrillation.  An echocardiogram showed an ejection fraction of 60 to 65%.  There was no source of cardiac emboli emboli identified.  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  This will need further evaluation and continued follow-up.  The patient's LDL was noted to be 96.  He had been on Pravachol 40 mg daily prior to admission.  Because of his stroke it is recommended that he have an LDL less than 70.  He was switched from Pravachol 40 mg daily to Lipitor 40 mg daily  The patient was on enteric-coated aspirin 81 mg daily prior to admission although he admits that he often missed doses. Dr. Leonie Man recommended that the patient start Plavix 75 mg daily in addition to his aspirin 81 mg daily. He will stay on both medications for 3 weeks after which he will take aspirin 81 mg alone.  The patient stated that he had a history of a bleeding ulcer 20 years ago. Dr. Leonie Man cautioned the patient to monitor for any signs or symptoms of bleeding and to report this to his primary care physician immediately if it should occur.  Because of the patient's visual deficits he was told that he would not be able to drive until cleared by a physician.     DISCHARGE EXAM Vitals:   03/15/18 0030 03/15/18 0334 03/15/18 0754 03/15/18 1147  BP: 123/72 118/79 135/83 (!) 147/88  Pulse: 63 63 67 78  Resp: 16 16 16 19   Temp: 97.9 F (36.6 C) 98 F (36.7 C) 97.8 F (36.6 C) 98.5 F (36.9 C)  TempSrc: Oral Oral Oral Oral  SpO2: 96% 95% 95% 97%  Weight:      Height:       Physical Examination: Blood pressure 139/80, pulse 69, temperature 98.1 F (36.7 C), temperature source Oral, resp. rate (!) 22, height 5' 5"  (1.651 m), weight 92.1 kg, SpO2 97 %.  obese elderly Caucasian male not in distress. . Afebrile. Head is nontraumatic. Neck is supple without bruit.    Cardiac exam no murmur or gallop. Lungs are clear to auscultation. Distal pulses are well felt.  Neurologic Examination: Awake alert oriented to time place and person. Normal speech and language. No dysarthria aphasia or apraxia. Extraocular movements are full range without nystagmus. Right pupil is 4 mm sluggishly reactive. Left is 4 mm briskly reactive. Vision acuity significantly diminished in the right eye with only light percent perception. Visual equity is normal in the left eye. Fundi were not visualized. Face is symmetric without weakness. Tongue midline. Motor system exam reveals symmetric upper and lower extremity strength without focal weakness. Deep tendon pulses are symmetric. Plantars are downgoing. Sensation is intact. Coordination is accurate. Gait not tested.  NIH stroke scale is 0 vision loss in the right eye is not on homonymous Baseline modified Rankin scale 0  Discharge Diet    Diet Order            Diet Heart  Room service appropriate? Yes; Fluid consistency: Thin  Diet effective now             liquids   DISCHARGE INSTRUCTIONS FOR PATIENT 1.  Take aspirin 81 mg daily with Plavix 75 mg daily for 3 weeks and then aspirin 81 mg daily alone indefinitely. 2.  Let your primary care physician know if there are any signs of bleeding. 3.  You are not to drive until cleared by a physician. 4.  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  You can discuss this with your primary care physician. 5.  Because of your stroke it is recommended that your LDL be less than 70.  For that reason your Pravachol was changed to Lipitor.    DISCHARGE PLAN  Disposition:  Dischargfe to home  ASA 81 mg and Plavix 75 mgf or 3 weeks then asa 81 mg alone.for secondary stroke prevention.  Ongoing risk factor control by Primary Care Physician at time of discharge  Follow-up Fisher, Kirstie Peri, MD in 2 weeks.  Follow-up in [facility] in 4 weeks, office to schedule an appointment.   40 minutes were spent preparing discharge.  Mikey Bussing PA-C [facility] Pager [phone] 03/15/2018, 2:29 PM I have personally obtained history,examined this patient, reviewed notes, independently viewed imaging studies, participated in medical decision making and plan of care.ROS completed by  me personally and pertinent positives fully documented  I have made any additions or clarifications directly to the above note. Agree with note above.   Antony Contras, MD Medical Director [facility] Pager: [phone] 03/15/2018 3:02 PM

## 2018-03-15 NOTE — Progress Notes (Signed)
PHARMACIST - PHYSICIAN COMMUNICATION  DR:   Leonie Man  CONCERNING: IV to Oral Route Change Policy  RECOMMENDATION: This patient is receiving Protonix by the intravenous route.  Based on criteria approved by the Pharmacy and Therapeutics Committee, the intravenous medication(s) is/are being converted to the equivalent oral dose form(s).   DESCRIPTION: These criteria include:  The patient is eating (either orally or via tube) and/or has been taking other orally administered medications for a least 24 hours  The patient has no evidence of active gastrointestinal bleeding or impaired GI absorption (gastrectomy, short bowel, patient on TNA or NPO).  If you have questions about this conversion, please contact the Pharmacy Department  []   [phone] )  [facility] []   [phone] )  [facility] [x]   [phone] )  [facility] []   [phone] )  [facility] []   [phone] )  [place], [place] 03/15/2018 1:09 PM

## 2018-03-16 ENCOUNTER — Encounter: Payer: Self-pay | Admitting: Cardiovascular Disease

## 2018-03-16 ENCOUNTER — Telehealth: Payer: Self-pay

## 2018-03-16 ENCOUNTER — Ambulatory Visit: Payer: Medicare Other | Admitting: Cardiovascular Disease

## 2018-03-16 VITALS — BP 128/88 | HR 101 | Ht 65.0 in | Wt 190.0 lb

## 2018-03-16 DIAGNOSIS — I6300 Cerebral infarction due to thrombosis of unspecified precerebral artery: Secondary | ICD-10-CM

## 2018-03-16 LAB — HEMOGLOBIN A1C
Hgb A1c MFr Bld: 5.8 % — ABNORMAL HIGH (ref 4.8–5.6)
Mean Plasma Glucose: 120 mg/dL

## 2018-03-16 MED ORDER — AMLODIPINE BESYLATE 10 MG PO TABS: 10.0000 mg | ORAL_TABLET | Freq: Every day | ORAL | 3 refills | Status: DC

## 2018-03-16 MED ORDER — EZETIMIBE 10 MG PO TABS: 10.0000 mg | ORAL_TABLET | Freq: Every day | ORAL | 3 refills | Status: DC

## 2018-03-16 NOTE — Patient Instructions (Addendum)
Medication Instructions:  Please start zetia 10 mg once a day  Please increase the amlodipine up to 10 mg daily Hold the HCTZ Monitor blood pressure  If you need a refill on your cardiac medications before your next appointment, please call your pharmacy.    Lab work: No new labs needed   If you have labs (blood work) drawn today and your tests are completely normal, you will receive your results only by: Marland Kitchen MyChart Message (if you have MyChart) OR . A paper copy in the mail If you have any lab test that is abnormal or we need to change your treatment, we will call you to review the results.   Testing/Procedures:  Transesophgeal echo (TEE) for CVA, Loop recorder (LINQ) device for CVA, possible atrial fib  You are scheduled for a TEE & Loop recorder on Thursday 03/26/17 with Dr. Rockey Situ (TEE) & Dr. Caryl Comes (loop recorder)  Please arrive at the [place] [facility] at 7:30 am on the day of your procedure- 1st desk on the right to check in.  DIET INSTRUCTIONS:  Nothing to eat or drink after midnight the night prior to your procedure  1) Labs: N/A  2) Medications:  You may take all of your regular medications with enough water to get them down safely the morning of your procedure  3) Must have a responsible person to drive you home.  4) Bring a current list of your medications and current insurance cards.  5) Please wash your chest & neck area with an antibacterial soap the night before & morning or your procedure     If you have any questions after you get home, please call the office at [phone]. Alvis Lemmings, RN, BSN   Follow-Up: At [facility], you and your health needs are our priority.  As part of our continuing mission to provide you with exceptional heart care, we have created designated Provider Care Teams.  These Care Teams include your primary Cardiologist (physician) and Advanced Practice Providers (APPs -  Physician Assistants and Nurse Practitioners) who all  work together to provide you with the care you need, when you need it.  . You will need a follow up appointment in 10-14 days (from 2/27) for a wound check appointment with our [facility] [place]  . You will need to follow up in 12 months with Dr. Rockey Situ .   Please call our office 2 months in advance to schedule this appointment.    . Providers on your designated Care Team:   . Murray Hodgkins, NP . Christell Faith, PA-C . Marrianne Mood, PA-C  Any Other Special Instructions Will Be Listed Below (If Applicable).  For educational health videos Log in to : [email or web address] Or : [email or web address], password : [email or web address]   Transesophageal Echocardiogram Transesophageal echocardiogram (TEE) is a test that uses sound waves to take pictures of your heart. TEE is done by passing a flexible tube down the esophagus. The esophagus is the tube that carries food from the throat to the stomach. The pictures give detailed images of your heart. This can help your doctor see if there are problems with your heart. What happens before the procedure? Staying hydrated Follow instructions from your doctor about hydration, which may include:  Up to 3 hours before the procedure - you may continue to drink clear liquids, such as: ? Water. ? Clear fruit juice. ? Black coffee. ? Plain tea.  Eating and drinking Follow instructions from your doctor about eating and  drinking, which may include:  8 hours before the procedure - stop eating heavy meals or foods such as meat, fried foods, or fatty foods.  6 hours before the procedure - stop eating light meals or foods, such as toast or cereal.  6 hours before the procedure - stop drinking milk or drinks that contain milk.  3 hours before the procedure - stop drinking clear liquids. General instructions  You will need to take out any dentures or retainers.  Plan to have someone take you home from the hospital or clinic.  If you will be going home right  after the procedure, plan to have someone with you for 24 hours.  Ask your doctor about: ? Changing or stopping your normal medicines. This is important if you take diabetes medicines or blood thinners. ? Taking over-the-counter medicines, vitamins, herbs, and supplements. ? Taking medicines such as aspirin and ibuprofen. These medicines can thin your blood. Do not take these medicines unless your doctor tells you to take them. What happens during the procedure?  To lower your risk of infection, your doctors will wash or clean their hands.  An IV will be put into one of your veins.  You will be given a medicine to help you relax (sedative).  A medicine may be sprayed or gargled. This numbs the back of your throat.  Your blood pressure, heart rate, and breathing will be watched.  You may be asked to lay on your left side.  A bite block will be placed in your mouth. This keeps you from biting the tube.  The tip of the TEE probe will be placed into the back of your mouth.  You will be asked to swallow.  Your doctor will take pictures of your heart.  The probe and bite block will be taken out. The procedure may vary among doctors and hospitals. What happens after the procedure?   Your blood pressure, heart rate, breathing rate, and blood oxygen level will be watched until the medicines you were given have worn off.  When you first wake up, your throat may feel sore and numb. This will get better over time. You will not be allowed to eat or drink until the numbness has gone away.  Do not drive for 24 hours if you were given a medicine to help you relax. Summary  TEE is a test that uses sound waves to take pictures of your heart.  You will be given a medicine to help you relax.  Do not drive for 24 hours if you were given a medicine to help you relax. This information is not intended to replace advice given to you by your health care provider. Make sure you discuss any  questions you have with your health care provider. Document Released: 11/11/2008 Document Revised: 10/03/2017 Document Reviewed: 04/17/2016 Elsevier Interactive Patient Education  2019 [employer].

## 2018-03-16 NOTE — Progress Notes (Signed)
Modified Rankin Score added based on review of the medical record (typically completed with PT eval however MD deferred PT eval for this pt).      03/15/18 1200  Modified Rankin (Stroke Patients Only)  Pre-Morbid Rankin Score 0  Modified Rankin [place], [place]   Acute Rehabilitation Services  Pager [phone] Office [phone] 03/16/2018

## 2018-03-16 NOTE — Telephone Encounter (Signed)
Transition Care Management Follow-up Telephone Call  Date of discharge and from where: [facility] on 03/15/18.  How have you been since you were released from the hospital? Doing better, most of his eye sight has come back in his right eye. Declines pain, SOB, weakness or n/v/d. Pt does have some blurriness in the right eye.   Any questions or concerns? No   Items Reviewed:  Did the pt receive and understand the discharge instructions provided? Yes   Medications obtained and verified? Declines, already updated today at Dr. Donivan Scull office.  Any new allergies since your discharge? No   Dietary orders reviewed? No  Do you have support at home? Yes   Other (ie: DME, Home Health, etc) N/A  Functional Questionnaire: (I = Independent and D = Dependent)  Bathing/Dressing- I   Meal Prep- I  Eating- I  Maintaining continence- I  Transferring/Ambulation- I  Managing Meds- I   Follow up appointments reviewed:    PCP Hospital f/u appt confirmed? Pt declined scheduling a HFU appt. Pt states he will wait to see PCP at f/u apt on 05/06/18.  [facility] f/u appt confirmed? Yes    Are transportation arrangements needed? No   If their condition worsens, is the pt aware to call  their PCP or go to the ED? Yes  Was the patient provided with contact information for the PCP's office or ED? Yes  Was the pt encouraged to call back with questions or concerns? Yes

## 2018-03-16 NOTE — Progress Notes (Signed)
Cardiology Office Note  Date:  03/16/2018   ID:  Jon Mejia, DOB: February 27, 1940, MRN: [id number]  PCP:  [name], MD   Chief Complaint  Patient presents with  . other    F/u ED CVA no complaints today. Meds reviewed verbally with pt.    HPI:  Jon Mejia is a 78 y.o. male who has a PMHx of: Stroke Mild aortic sclerosis Pure hypercholesterolemia HTN Premature atrial beats quit smoking about 34 years ago. His smoking use included cigarettes. He has a 20.00 pack-year smoking history. He was referred by his PCP, Fisher, Kirstie Peri, MD for new patient evaluation of his stroke , PAD  INTERVAL HISTORY: The patient reports today for an initial consultation. He is accompanied by his wife and daughter.  ED visit on 03/13/2018 for sudden onset decreased vision while working outside around 2pm. He initially had complete vision loss, but eventually was just seeing shadows. Never happened before. He denied numbness, tingling, or focal weakness. He denies headache. His wife states he has been tired recently and last night was feeling dizzy. Code stroke called on presentation and patient taken given alteplase.    He was diagnosed with acute stroke symptoms and was admitted to ICU per neurology for monitoring. The patient was discharged 03/15/2018.   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  Images did identify acute punctate stroke  Continues on HCTZ, having dizziness He does not drink a lot of water throughout the day.   He stays active with various chores around the house.  Today's Blood pressure 128/88  Lab work reviewed with him in detail Total Chol 147/ LDL 96 HBA1C 5.8 CR 1.10 Glucose 104   EKG personally reviewed by myself on todays visit Shows sinus tachycardia. 100 bpm.   OTHER PAST MEDICAL HISTORY REVIEWED BY ME FOR TODAY'S VISIT: echo  01/2018 INTERPRETATION  NORMAL LEFT VENTRICULAR SYSTOLIC FUNCTION  WITH MILD LVH  NORMAL RIGHT VENTRICULAR SYSTOLIC FUNCTION  MILD VALVULAR REGURGITATION (See above)  MILD VALVULAR STENOSIS (See above)  AVA(VTI)=1.15cm^2  MILD MR, TR, PR  MILD AS  EF 50-55%  Morphology: MODERATELY THICKENED aortic valve 265.2 cm/sec peak vel   28.1 mmHg peak grad             10.7 mmHg mean grad    seen by Dr. Ubaldo Glassing 01/09/2018 abnormal electrocardiogram at his primary care provider's office. This revealed sinus rhythm with atrial bigeminy. He has had an echo in the past in 2017 which revealed normal LV function with trileaflet but moderately calcified aortic valve with sclerosis no stenosis.   Holter showed sinus rhythm with frequent PACs with greater than 10% burden. There were no sustained ventricular or supraventricular arrhythmias  MR Brain WO Contrast on 03/11/2018 showed: 1. Punctate acute infarct in the posterior right frontal lobe. 2. Small chronic cerebellar infarct.  CT neck on 03/13/2018 showed: 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 On the right, there is soft and calcified plaque. Maximal stenosis in the right ICA bulb is 30%. No measurable stenosis on the left.  50% stenosis of the left vertebral artery origin.  No intracranial large or medium vessel occlusion.  Flow is present in both ophthalmic arteries.  Mild fusiform dilatation at the right MCA bifurcation but no measurable berry aneurysm.  Distal vessel atherosclerotic irregularity in the anterior, middle and posterior cerebral artery branches.    PMH:  has a past medical history of Heart murmur, History of chicken pox, History of measles, and Hyperlipidemia.  PSH:    Past Surgical History:  Procedure Laterality Date  . CARDIAC CATHETERIZATION  11/2017   No stents/Fath    Current Outpatient Medications  Medication Sig Dispense Refill  . amLODipine (NORVASC) 5 MG  tablet Take 1 tablet (5 mg total) by mouth daily. 30 tablet 12  . aspirin EC 81 MG EC tablet Take 1 tablet (81 mg total) by mouth daily.    Marland Kitchen atorvastatin (LIPITOR) 40 MG tablet Take 1 tablet (40 mg total) by mouth daily at 6 PM. 30 tablet 11  . clopidogrel (PLAVIX) 75 MG tablet Take 1 tablet (75 mg total) by mouth daily. 21 tablet 0  . diclofenac sodium (VOLTAREN) 1 % GEL Apply 2 g topically 4 (four) times daily. (Patient taking differently: Apply 2 g topically 4 (four) times daily as needed (hand pain). ) 100 g 2  . meloxicam (MOBIC) 15 MG tablet take 1 tablet by mouth once daily after meals if needed for BACK PAIN (Patient taking differently: Take 15 mg by mouth daily as needed (back pain). ) 30 tablet 2   No current facility-administered medications for this visit.      ALLERGIES:   Patient has no known allergies.   SOCIAL HISTORY:  The patient  reports that he quit smoking about 35 years ago. He has a 20.00 pack-year smoking history. He has never used smokeless tobacco. He reports that he does not drink alcohol or use drugs.   FAMILY HISTORY:   family history includes Arrhythmia in his mother; Diabetes in his son.    REVIEW OF SYSTEMS: Review of Systems  Constitutional: Negative.   Eyes: Negative.   Respiratory: Negative.  Negative for shortness of breath.   Cardiovascular: Negative.  Negative for chest pain.  Gastrointestinal: Negative.   Genitourinary: Negative.   Musculoskeletal: Negative.   Neurological: Negative.   Psychiatric/Behavioral: Negative.   All other systems reviewed and are negative.    PHYSICAL EXAM: VS:  BP 128/88 (BP Location: Right Arm, Patient Position: Sitting, Cuff Size: Normal)   Pulse (!) 101   Ht 5\' 5"  (1.651 m)   Wt 190 lb (86.2 kg)   BMI 31.62 kg/m  , BMI Body mass index is 31.62 kg/m.  GEN: Well nourished, well developed, in no acute distress HEENT: normal Neck: no JVD, carotid bruits, or masses Cardiac: RRR; 1/6 aortic valve murmur,  rubs, or gallops,no edema  Respiratory:  clear to auscultation bilaterally, normal work of breathing GI: soft, nontender, nondistended, + BS MS: no deformity or atrophy Skin: warm and dry, no rash Neuro:  Strength and sensation are intact Psych: euthymic mood, full affect   RECENT LABS: 12/03/2017: Magnesium 2.3; TSH 2.930 03/13/2018: ALT 19; BUN 19; Creatinine, Ser 1.10; Hemoglobin 14.6; Platelets 241; Potassium 4.0; Sodium 138    LIPID PANEL: Lab Results  Component Value Date   CHOL 147 03/14/2018   HDL 33 (L) 03/14/2018   LDLCALC 96 03/14/2018   TRIG 88 03/14/2018      WEIGHT: Wt Readings from Last 3 Encounters:  03/16/18 190 lb (86.2 kg)  03/13/18 195 lb 15.8 oz (88.9 kg)  12/05/17 194 lb (88 kg)       ASSESSMENT AND PLAN:   Acute stroke Long discussion with him concerning recent events With acute stroke as well as prior stroke Discussed additional work-up that can be performed We will schedule a Transesophgeal echo for CVA,  Reveal device for CVA, possible atrial fib Follow up with Dr. Caryl Comes after device is placed  Essential hypertension He is having dizziness possibly from prerenal state as documented on recent lab work Recommend increasing amlodipine 10 mg  Recommend stopping HCTZ 25 mg Monitor blood pressure at home. 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 Greater than 50% was spent in counseling and coordination of care with  the patient.   No orders of the defined types were placed in this encounter.  I, Margit Banda am acting as a scribe for Ida Rogue, M.D., Ph.D.  I have reviewed the above documentation for accuracy and completeness, and I agree with the above.   Signed, Esmond Plants, M.D., Ph.D. 03/16/2018  [place], [place]

## 2018-03-20 DIAGNOSIS — I639 Cerebral infarction, unspecified: Secondary | ICD-10-CM

## 2018-03-20 HISTORY — DX: Cerebral infarction, unspecified: I63.9

## 2018-03-24 ENCOUNTER — Other Ambulatory Visit: Payer: Self-pay | Admitting: Internal Medicine

## 2018-03-25 ENCOUNTER — Other Ambulatory Visit: Payer: Self-pay | Admitting: Cardiovascular Disease

## 2018-03-25 DIAGNOSIS — I639 Cerebral infarction, unspecified: Secondary | ICD-10-CM

## 2018-03-26 ENCOUNTER — Encounter
Admission: RE | Disposition: A | Discharge status: Home / Self Care | Payer: Self-pay | Source: Home / Self Care | Attending: Cardiovascular Disease

## 2018-03-26 ENCOUNTER — Encounter: Payer: Self-pay | Admitting: Cardiovascular Disease

## 2018-03-26 ENCOUNTER — Other Ambulatory Visit: Payer: Self-pay

## 2018-03-26 ENCOUNTER — Ambulatory Visit
Admission: RE | Admit: 2018-03-26 | Discharge: 2018-03-26 | Disposition: A | Discharge status: Home / Self Care | Payer: Medicare Other | Attending: Cardiovascular Disease | Admitting: Cardiovascular Disease

## 2018-03-26 ENCOUNTER — Other Ambulatory Visit: Payer: Self-pay | Admitting: Cardiovascular Disease

## 2018-03-26 ENCOUNTER — Telehealth: Payer: Self-pay | Admitting: *Deleted

## 2018-03-26 ENCOUNTER — Ambulatory Visit: Admission: RE | Admit: 2018-03-26 | Payer: Medicare Other | Source: Home / Self Care | Admitting: Cardiovascular Disease

## 2018-03-26 ENCOUNTER — Telehealth: Payer: Self-pay | Admitting: Cardiovascular Disease

## 2018-03-26 DIAGNOSIS — I639 Cerebral infarction, unspecified: Secondary | ICD-10-CM | POA: Insufficient documentation

## 2018-03-26 DIAGNOSIS — I4891 Unspecified atrial fibrillation: Secondary | ICD-10-CM | POA: Diagnosis not present

## 2018-03-26 DIAGNOSIS — Z539 Procedure and treatment not carried out, unspecified reason: Secondary | ICD-10-CM | POA: Diagnosis not present

## 2018-03-26 HISTORY — DX: Personal history of transient ischemic attack (TIA), and cerebral infarction without residual deficits: Z86.73

## 2018-03-26 SURGERY — ECHOCARDIOGRAM, TRANSESOPHAGEAL
Anesthesia: Moderate Sedation

## 2018-03-26 SURGERY — LOOP RECORDER INSERTION
Anesthesia: LOCAL

## 2018-03-26 MED ORDER — METOPROLOL SUCCINATE ER 25 MG PO TB24: 25.0000 mg | ORAL_TABLET | Freq: Every day | ORAL | 3 refills | Status: DC

## 2018-03-26 MED ORDER — APIXABAN 5 MG PO TABS: 5.0000 mg | ORAL_TABLET | Freq: Two times a day (BID) | ORAL | 6 refills | Status: DC

## 2018-03-26 MED ORDER — SODIUM CHLORIDE 0.9 % IV SOLN: INTRAVENOUS | Status: DC

## 2018-03-26 MED ORDER — LIDOCAINE-EPINEPHRINE (PF) 1 %-1:200000 IJ SOLN
INTRAMUSCULAR | Status: AC
  Filled 2018-03-26: qty 30

## 2018-03-26 MED ORDER — AMLODIPINE BESYLATE 5 MG PO TABS: 5.0000 mg | ORAL_TABLET | Freq: Every day | ORAL | 3 refills | Status: DC

## 2018-03-26 NOTE — Telephone Encounter (Signed)
Spoke with pt. and reviewed Hgb A1C result. Pt. verbalized understanding of same/fim

## 2018-03-26 NOTE — Telephone Encounter (Signed)
Spoke with Robin pharmacist at [facility] and reviewed that medications were changed today by provider. They discontinued patients aspirin and plavix and started him on Eliquis. They verbalized understanding with no further questions at this time.

## 2018-03-26 NOTE — Telephone Encounter (Signed)
Pharmacy states pt has had Plavix prescribed. Pt is also on Eliquis. Please call to discuss.

## 2018-03-26 NOTE — Telephone Encounter (Signed)
-----   Message from Garvin Fila, MD sent at 03/25/2018  4:42 PM EST ----- Kindly inform the patient that hemoglobin A1c test for diabetes screening  was satisfactory

## 2018-03-26 NOTE — Progress Notes (Signed)
Dr. Rockey Situ spoke with family at bedside re: rhythm strip caught of PAF. Dr. Rockey Situ spoke with Dr. Caryl Comes on phone also to concur.

## 2018-03-26 NOTE — Progress Notes (Signed)
TEE & Loop recorder cancelled. Dr. Rockey Situ spoke extensively with family re: new meds., changes to meds. Family x 4 verbalized understanding. New RX sent to pt. Pharmacy. Pt. Stable for DC. No neuro deficits upon DC.

## 2018-03-26 NOTE — Progress Notes (Signed)
Mr Jon Mejia presented for TEE and placement of reveal device\ Tele placed showing atrial fib, rate 171 beats per minute lasting several seconds Given recent CVA and atrial fib on tele, Case was cancelled  Recommended he start eliquis 5 BID Decrease the amlodipine down to 5 mg daily Start metoprolol succinate 25 mg daily Stop asa and plavix  Signed, Esmond Plants, MD, Ph.D [facility]

## 2018-03-29 NOTE — H&P (Signed)
H/x of CVA Was scheduled for TEE and placement of loop monitor This was cancelled after noting he had run of atrial fib this AM on telemetry  Started on eliquis 5 BID  Signed, Esmond Plants, MD, Ph.D [facility]

## 2018-04-02 DIAGNOSIS — H3411 Central retinal artery occlusion, right eye: Secondary | ICD-10-CM | POA: Diagnosis not present

## 2018-04-07 ENCOUNTER — Ambulatory Visit: Payer: Self-pay

## 2018-05-06 ENCOUNTER — Ambulatory Visit: Payer: Self-pay

## 2018-05-06 ENCOUNTER — Encounter: Payer: Self-pay | Admitting: Family Medicine

## 2018-05-19 ENCOUNTER — Other Ambulatory Visit: Payer: Self-pay

## 2018-05-19 ENCOUNTER — Ambulatory Visit (INDEPENDENT_AMBULATORY_CARE_PROVIDER_SITE_OTHER): Payer: Medicare Other | Admitting: Neurology

## 2018-05-19 DIAGNOSIS — H3411 Central retinal artery occlusion, right eye: Secondary | ICD-10-CM

## 2018-05-19 DIAGNOSIS — I480 Paroxysmal atrial fibrillation: Secondary | ICD-10-CM

## 2018-05-19 NOTE — Progress Notes (Signed)
Virtual Visit via Video Note  I connected with Jon Mejia on 05/19/18 at  2:30 PM EDT by a video enabled telemedicine application and verified that I am speaking with the correct person using two identifiers.  The patient was at his home for this video visit which was facilitated by his wife.  I was in my office for this visit.   I discussed the limitations of evaluation and management by telemedicine and the availability of in person appointments. The patient expressed understanding and agreed to proceed.  History of Present Illness: Jon Mejia is a 78 year old pleasant Caucasian male seen today for of virtual video office follow-up visit following hospital admission for right eye vision loss in February 2020.  History is obtained from the patient and wife and I have personally reviewed electronic medical records and imaging films in PACS.  He is a 78 year old male with past medical history of hypertension who presented with sudden onset of pain in his right eye with vision loss on 03/13/2018.  Patient denied any other focal neurological symptoms.  Due to profound vision loss and suspicion for central retinal artery occlusion patient was given IV TPA uneventfully.  MRI scan of the brain subsequently revealed a questionable tiny punctate right posterior frontal infarct which was seen only on axial images but not on coronal images.  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  Telemetry monitoring at that time did not reveal A. fib.  Transthoracic echo showed normal ejection fraction with severely thickened aortic valve with moderate stenosis.  LDL cholesterol was elevated at 96 mg percent.  Patient had been on Pravachol 40 mg which was switched to Lipitor 40 and Zetia 10 mg was added.  Patient had been on aspirin 81 mg prior to admission but had missed several doses.  He was discharged home on aspirin and Plavix with plans to do outpatient TEE and loop placement but subsequently  saw his cardiologist Dr. Rockey Situ who did outpatient cardiac monitoring and patient was found to be in paroxysmal atrial fibrillation.  Since then he has been switched to Eliquis which is tolerating well with only minor bruising and no bleeding.  He is also tolerating Lipitor and Zetia well with side effects.  Patient states his right eye vision seems to have improved and he can see fairly well.  He has followed up with ophthalmologist who has noted improvement.  Patient denies any prior history of known strokes though his MRI had shown a small left cerebellar infarct of remote age.  He states his blood pressure is well controlled.  He has no new complaints today.    Observations/Objective: Physical exam and neurological exam limited due to constraints from video visit.  Pleasant elderly Caucasian male not in distress.  He is awake alert oriented x3 with normal speech and language function.  Slightly diminished hearing bilaterally.  Extraocular movements are full range without nystagmus.  Visual acuity cannot be reliably tested.  Face is symmetric without weakness.  Tongue midline. He is able to get up and walk with a fairly steady gait.  He does not appear to have any focal weakness in his extremities. Assessment and Plan: 78 year old Caucasian male with sudden onset of right eye vision loss likely due to embolic central retinal artery occlusion and silent questionable tiny right frontal infarct initially felt to be of cryptogenic etiology but subsequently found to have paroxysmal A. fib on cardiac monitoring.  Vascular risk factors of hypertension, hyperlipidemia, paroxysmal A.  fib and cerebrovascular disease.   Follow Up Instructions: Agree with Eliquis for secondary stroke prevention given paroxysmal A. fib and recent history of ocular stroke.  Maintain aggressive risk factor modification with strict control of hypertension with blood pressure goal below 130/90, lipids with LDL cholesterol goal below 70  mg percent and diabetes with hemoglobin A1c goal below 6.5%.  Patient was counseled to eat a healthy diet with lots of fruits, vegetables, cereals, whole grains and to be active and exercise 30 minutes daily.  He was advised to follow-up with his cardiologist Dr. Rockey Situ for his aortic stenosis and follow-up lipid profile.  He will return for follow-up with my assistant Janett Billow in 6 months or call earlier if necessary   I discussed the assessment and treatment plan with the patient. The patient was provided an opportunity to ask questions and all were answered. The patient agreed with the plan and demonstrated an understanding of the instructions.   The patient was advised to call back or seek an in-person evaluation if the symptoms worsen or if the condition fails to improve as anticipated.  I provided 25 minutes of non-face-to-face time during this encounter.   Antony Contras, MD

## 2018-05-26 ENCOUNTER — Telehealth: Payer: Self-pay | Admitting: *Deleted

## 2018-05-26 DIAGNOSIS — Z0289 Encounter for other administrative examinations: Secondary | ICD-10-CM

## 2018-05-26 NOTE — Telephone Encounter (Signed)
Received p/w [place] to be filled out. Completed MD part, to be reviewed and signed by Dr. Leonie Man.  Given to KM/RN.

## 2018-05-27 ENCOUNTER — Telehealth: Payer: Self-pay | Admitting: Neurology

## 2018-05-27 NOTE — Telephone Encounter (Signed)
Form for hospital stay sign by Dr.SEthi given to Jon Mejia in medical records. Pt paid 50.00 fee. The pt has to filled out the claimant statement.

## 2018-05-27 NOTE — Telephone Encounter (Signed)
Called pt x2- unable to contact for f/u no VM setup

## 2018-06-12 ENCOUNTER — Other Ambulatory Visit: Payer: Self-pay

## 2018-06-12 NOTE — Patient Outreach (Signed)
Telephone outreach to patient to obtain mRs was successfully completed. mRs= 1. 

## 2018-07-03 DIAGNOSIS — H3411 Central retinal artery occlusion, right eye: Secondary | ICD-10-CM | POA: Diagnosis not present

## 2018-08-21 ENCOUNTER — Ambulatory Visit (INDEPENDENT_AMBULATORY_CARE_PROVIDER_SITE_OTHER): Payer: Medicare Other | Admitting: Family Medicine

## 2018-08-21 ENCOUNTER — Other Ambulatory Visit: Payer: Self-pay

## 2018-08-21 ENCOUNTER — Ambulatory Visit: Payer: Self-pay

## 2018-08-21 ENCOUNTER — Encounter: Payer: Self-pay | Admitting: Family Medicine

## 2018-08-21 VITALS — BP 134/61 | HR 70 | Temp 98.5°F | Resp 16 | Ht 65.0 in | Wt 176.0 lb

## 2018-08-21 DIAGNOSIS — I10 Essential (primary) hypertension: Secondary | ICD-10-CM

## 2018-08-21 DIAGNOSIS — Z125 Encounter for screening for malignant neoplasm of prostate: Secondary | ICD-10-CM | POA: Diagnosis not present

## 2018-08-21 DIAGNOSIS — I350 Nonrheumatic aortic (valve) stenosis: Secondary | ICD-10-CM

## 2018-08-21 DIAGNOSIS — Z8673 Personal history of transient ischemic attack (TIA), and cerebral infarction without residual deficits: Secondary | ICD-10-CM

## 2018-08-21 DIAGNOSIS — E7800 Pure hypercholesterolemia, unspecified: Secondary | ICD-10-CM | POA: Diagnosis not present

## 2018-08-21 DIAGNOSIS — Z0000 Encounter for general adult medical examination without abnormal findings: Secondary | ICD-10-CM

## 2018-08-21 DIAGNOSIS — Z23 Encounter for immunization: Secondary | ICD-10-CM

## 2018-08-21 NOTE — Progress Notes (Signed)
Patient: Jon Mejia, Male    DOB: 1940-11-30, 78 y.o.   MRN: [id number] Visit Date: 08/21/2018  Today's Provider: [name], MD   Chief Complaint  Patient presents with  . Medicare Wellness  . Annual Exam   Subjective:     Annual wellness visit [name] is a 78 y.o. male. He feels fairly well. He reports exercising daily. He reports he is sleeping fairly well.    Yearly physical exam:  -----------------------------------------------------------  Hypertension, follow-up:  BP Readings from Last 3 Encounters:  08/21/18 134/61  03/26/18 (!) 145/86  03/16/18 128/88    He was last seen for hypertension 8 months ago.  BP at that visit was 138/62. Management since that visit includes no changes. He reports good compliance with treatment. He is not having side effects.  He is exercising. He is not adherent to low salt diet.   Outside blood pressures are checked and average . He is experiencing none.  Patient denies chest pain, chest pressure/discomfort, claudication, exertional chest pressure/discomfort, fatigue, irregular heart beat, lower extremity edema, near-syncope, orthopnea, palpitations, paroxysmal nocturnal dyspnea, syncope and tachypnea.   Cardiovascular risk factors include advanced age (older than 32 for men, 51 for women), hypertension and male gender.  Use of agents associated with hypertension: none.     Weight trend: decreasing steadily Wt Readings from Last 3 Encounters:  08/21/18 176 lb (79.8 kg)  03/26/18 190 lb (86.2 kg)  03/16/18 190 lb (86.2 kg)    Current diet: well balanced  ------------------------------------------------------------------------  Hyperglycemia, Follow-up:   Lab Results  Component Value Date   HGBA1C 5.8 (H) 03/14/2018   HGBA1C 5.9 (H) 12/03/2017   HGBA1C 6.1 (H) 04/29/2017   GLUCOSE 104 (H) 03/13/2018   GLUCOSE 98 12/03/2017   GLUCOSE 94 04/29/2017    Last seen for for this 8 months ago.   Management since then includes no changes. Current symptoms include none and have been stable.  Weight trend: decreasing steadily Prior visit with dietician: no Current diet: well balanced Current exercise: walking  Pertinent Labs:    Component Value Date/Time   CHOL 147 03/14/2018 0711   CHOL 138 04/29/2017 1001   TRIG 88 03/14/2018 0711   CHOLHDL 4.5 03/14/2018 0711   CREATININE 1.10 03/13/2018 1517   CREATININE 1.10 01/09/2017 0906    Wt Readings from Last 3 Encounters:  08/21/18 176 lb (79.8 kg)  03/26/18 190 lb (86.2 kg)  03/16/18 190 lb (86.2 kg)    Lipid/Cholesterol, Follow-up:   Last seen for this1 years ago.  Management changes since that visit include none. . Last Lipid Panel:    Component Value Date/Time   CHOL 147 03/14/2018 0711   CHOL 138 04/29/2017 1001   TRIG 88 03/14/2018 0711   HDL 33 (L) 03/14/2018 0711   HDL 41 04/29/2017 1001   CHOLHDL 4.5 03/14/2018 0711   VLDL 18 03/14/2018 0711   LDLCALC 96 03/14/2018 0711   LDLCALC 83 04/29/2017 1001   LDLCALC 119 (H) 01/09/2017 0906    Risk factors for vascular disease include hypercholesterolemia and hypertension  He reports good compliance with treatment. He is not having side effects.  Current symptoms include none and have been stable. Weight trend: decreasing steadily Prior visit with dietician: no Current diet: well balanced Current exercise: walking  Wt Readings from Last 3 Encounters:  08/21/18 176 lb (79.8 kg)  03/26/18 190 lb (86.2 kg)  03/16/18 190 lb (86.2 kg)    -------------------------------------------------------------------  Review of Systems  Constitutional: Negative for appetite change, chills, fatigue and fever.  HENT: Negative for congestion, ear pain, hearing loss, nosebleeds and trouble swallowing.   Eyes: Negative for pain and visual disturbance.  Respiratory: Positive for shortness of breath. Negative for cough and chest tightness.   Cardiovascular: Negative for  chest pain, palpitations and leg swelling.  Gastrointestinal: Negative for abdominal pain, blood in stool, constipation, diarrhea, nausea and vomiting.  Endocrine: Negative for polydipsia, polyphagia and polyuria.  Genitourinary: Negative for dysuria and flank pain.  Musculoskeletal: Positive for back pain. Negative for arthralgias, joint swelling, myalgias and neck stiffness.  Skin: Negative for color change, rash and wound.  Neurological: Negative for dizziness, tremors, seizures, speech difficulty, weakness, light-headedness and headaches.  Psychiatric/Behavioral: Negative for behavioral problems, confusion, decreased concentration, dysphoric mood and sleep disturbance. The patient is not nervous/anxious.   All other systems reviewed and are negative.   Social History   Socioeconomic History  . Marital status: Married    Spouse name: [name]  . Number of children: 2  . Years of education: Not on file  . Highest education level: 12th grade  Occupational History  . Occupation: Retired  Scientific laboratory technician  . Financial resource strain: Not hard at all  . Food insecurity    Worry: Never true    Inability: Never true  . Transportation needs    Medical: No    Non-medical: No  Tobacco Use  . Smoking status: Former Smoker    Packs/day: 1.00    Years: 20.00    Pack years: 20.00    Quit date: 03/12/1983    Years since quitting: 35.4  . Smokeless tobacco: Never Used  Substance and Sexual Activity  . Alcohol use: No  . Drug use: No  . Sexual activity: Not on file  Lifestyle  . Physical activity    Days per week: Not on file    Minutes per session: Not on file  . Stress: Not at all  Relationships  . Social connections    Talks on phone: More than three times a week    Gets together: Twice a week    Attends religious service: More than 4 times per year    Active member of club or organization: Not on file    Attends meetings of clubs or organizations: Not on file    Relationship  status: Married  . Intimate partner violence    Fear of current or ex partner: No    Emotionally abused: No    Physically abused: No    Forced sexual activity: No  Other Topics Concern  . Not on file  Social History Narrative  . Not on file    Past Medical History:  Diagnosis Date  . Heart murmur   . History of chicken pox   . History of measles   . Hyperlipidemia   . Stroke occurring within last month 03/20/2018     Patient Active Problem List   Diagnosis Date Noted  . Stroke ([place]) 03/13/2018  . Hyperglycemia 01/26/2016  . 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 Back pain with left-sided radiculopathy 01/19/2015  . Syncope 10/21/2011  . Pure hypercholesterolemia 01/11/2008  . Essential (primary) hypertension 06/08/2007  . Premature atrial beats 01/05/2007    Past Surgical History:  Procedure Laterality Date  . CARDIAC CATHETERIZATION  11/2017   No stents/Fath    His family history includes Arrhythmia in his mother; Diabetes in his son. There is no history of Colon cancer  or Prostate cancer.   Current Outpatient Medications:  .  amLODipine (NORVASC) 5 MG tablet, Take 1 tablet (5 mg total) by mouth daily., Disp: 90 tablet, Rfl: 3 .  apixaban (ELIQUIS) 5 MG TABS tablet, Take 1 tablet (5 mg total) by mouth 2 (two) times daily., Disp: 60 tablet, Rfl: 6 .  diclofenac sodium (VOLTAREN) 1 % GEL, Apply 2 g topically 4 (four) times daily., Disp: 100 g, Rfl: 2 .  lovastatin (MEVACOR) 40 MG tablet, Take 1 tablet by mouth daily., Disp: , Rfl:  .  meloxicam (MOBIC) 15 MG tablet, take 1 tablet by mouth once daily after meals if needed for BACK PAIN (Patient taking differently: Take 15 mg by mouth daily as needed (back pain). ), Disp: 30 tablet, Rfl: 2 .  metoprolol succinate (TOPROL-XL) 25 MG 24 hr tablet, Take 1 tablet (25 mg total) by mouth daily. Take with or immediately following a meal., Disp: 90 tablet, Rfl: 3  Patient Care Team: [name], MD as PCP - General  (Family Medicine) [name], OD as Consulting Physician (Optometry)    Objective:    Vitals: BP 134/61 (BP Location: Right Arm)   Pulse 70   Temp 98.5 F (36.9 C) (Oral)   Resp 16   Ht 5\' 5"  (1.651 m)   Wt 176 lb (79.8 kg)   SpO2 96% Comment: room air  BMI 29.29 kg/m    General Appearance:    Alert, cooperative, no distress, appears stated age  Head:    Normocephalic, without obvious abnormality, atraumatic  Eyes:    PERRL, conjunctiva/corneas clear, EOM's intact, fundi    benign, both eyes       Ears:    Normal TM's and external ear canals, both ears  Nose:   Nares normal, septum midline, mucosa normal, no drainage   or sinus tenderness  Throat:   Lips, mucosa, and tongue normal; teeth and gums normal  Neck:   Supple, symmetrical, trachea midline, no adenopathy;       thyroid:  No enlargement/tenderness/nodules; no carotid   bruit or JVD  Back:     Symmetric, no curvature, ROM normal, no CVA tenderness  Lungs:     Clear to auscultation bilaterally, respirations unlabored  Chest wall:    No tenderness or deformity  Heart:    Normal heart rate. Normal rhythm.  3/6 harsh, crescendo-decrescendo, systolic murmur at right upper sternal border S1 and S2 normal  Abdomen:     Soft, non-tender, bowel sounds active all four quadrants,    no masses, no organomegaly  Genitalia:    deferred  Rectal:    deferred  Extremities:   Extremities normal, atraumatic, no cyanosis or edema  Pulses:   2+ and symmetric all extremities  Skin:   Skin color, texture, turgor normal, no rashes or lesions  Lymph nodes:   Cervical, supraclavicular, and axillary nodes normal  Neurologic:   CNII-XII intact. Normal strength, sensation and reflexes      throughout      Activities of Daily Living In your present state of health, do you have any difficulty performing the following activities: 08/21/2018 03/14/2018  Hearing? N -  Vision? N -  Difficulty concentrating or making decisions? N -   Walking or climbing stairs? N -  Dressing or bathing? N -  Doing errands, shopping? N N  Some recent data might be hidden    Fall Risk Assessment Fall Risk  08/21/2018 04/29/2017 01/25/2016 01/24/2015  Falls in the past  year? 0 No No No  Number falls in past yr: 0 - - -  Injury with Fall? 0 - - -  Follow up Falls evaluation completed - - -     Depression Screen PHQ 2/9 Scores 08/21/2018 04/29/2017 04/29/2017 01/25/2016  PHQ - 2 Score 0 0 0 0  PHQ- 9 Score 0 0 - -    Cognitive Testing - 6-CIT  Correct? Score   What year is it? yes 0 0 or 4  What month is it? yes 0 0 or 3  Memorize:    Pia Mau,  44,  [place],      What time is it? (within 1 hour) yes 0 0 or 3  Count backwards from 20 yes 0 0, 2, or 4  Name the months of the year yes 0 0, 2, or 4  Repeat name & address above yes 0 0, 2, 4, 6, 8, or 10       TOTAL SCORE  0/28   Interpretation:  Normal  Normal (0-7) Abnormal (8-28)   No flowsheet data found.    Assessment & Plan:     Annual Wellness Visit  Reviewed patient's Family Medical History Reviewed and updated list of patient's medical providers Assessment of cognitive impairment was done Assessed patient's functional ability Established a written schedule for health screening [place] Completed and Reviewed  Exercise Activities and Dietary recommendations Goals    . Exercise 3x per week (30 min per time)     Recommend to walk 3 days a week for 30 minutes at a time without stopping.        Immunization History  Administered Date(s) Administered  . Influenza, High Dose Seasonal PF 01/17/2014, 01/25/2016, 01/09/2017, 12/05/2017  . Influenza,inj,Quad PF,6+ Mos 01/24/2015  . Pneumococcal Conjugate-13 07/12/2013  . Pneumococcal Polysaccharide-23 01/07/2008  . Td 12/15/2003  . Tdap 08/15/2015    Health Maintenance  Topic Date Due  . INFLUENZA VACCINE  08/29/2018  . TETANUS/TDAP  08/14/2025  . PNA vac Low Risk Adult   Completed     Discussed health benefits of physical activity, and encouraged him to engage in regular exercise appropriate for his age and condition.    ------------------------------------------------------------------------------------------------------------  1. Medicare annual wellness visit, subsequent   2. Annual physical exam   3. Prostate cancer screening  - PSA  4. Essential (primary) hypertension Well controlled.  Continue current medications.    5. Pure hypercholesterolemia Consider higher intensity statin if ldl >70 - lovastatin (MEVACOR) 40 MG tablet; Take 1 tablet by mouth daily. - Lipid panel - Comprehensive metabolic panel - CBC  6. Need for shingles vaccine counseled  7. Nonrheumatic aortic valve stenosis Asymptomatic. Compliant with medication.  Continue aggressive risk factor modification.  Continue routine follow up cardiology.   8. History of stroke Asymptomatic. Compliant with medication.  Continue aggressive risk factor modification.     [name], MD  [facility]

## 2018-08-21 NOTE — Patient Instructions (Addendum)
.   Please review the attached list of medications and notify my office if there are any errors.   . Please bring all of your medications to every appointment so we can make sure that our medication list is the same as yours.   . We will have flu vaccines available after Labor Day. Please go to your pharmacy or call the office in early September to schedule you flu shot.   The CDC recommends two doses of Shingrix (the shingles vaccine) separated by 2 to 6 months for adults age 50 years and older. I recommend checking with your insurance plan regarding coverage for this vaccine.      

## 2018-08-22 LAB — COMPREHENSIVE METABOLIC PANEL
ALT: 12 IU/L (ref 0–44)
AST: 21 IU/L (ref 0–40)
Albumin/Globulin Ratio: 2.4 — ABNORMAL HIGH (ref 1.2–2.2)
Albumin: 4.7 g/dL (ref 3.7–4.7)
Alkaline Phosphatase: 68 IU/L (ref 39–117)
BUN/Creatinine Ratio: 13 (ref 10–24)
BUN: 13 mg/dL (ref 8–27)
Bilirubin Total: 0.8 mg/dL (ref 0.0–1.2)
CO2: 21 mmol/L (ref 20–29)
Calcium: 9.5 mg/dL (ref 8.6–10.2)
Chloride: 106 mmol/L (ref 96–106)
Creatinine, Ser: 1.01 mg/dL (ref 0.76–1.27)
GFR calc Af Amer: 83 mL/min/{1.73_m2} (ref >59)
GFR calc non Af Amer: 72 mL/min/{1.73_m2} (ref >59)
Globulin, Total: 2 g/dL (ref 1.5–4.5)
Glucose: 89 mg/dL (ref 65–99)
Potassium: 4.3 mmol/L (ref 3.5–5.2)
Sodium: 144 mmol/L (ref 134–144)
Total Protein: 6.7 g/dL (ref 6.0–8.5)

## 2018-08-22 LAB — PSA: Prostate Specific Ag, Serum: 2.3 ng/mL (ref 0.0–4.0)

## 2018-08-22 LAB — CBC
Hematocrit: 42.4 % (ref 37.5–51.0)
Hemoglobin: 14.4 g/dL (ref 13.0–17.7)
MCH: 29.5 pg (ref 26.6–33.0)
MCHC: 34 g/dL (ref 31.5–35.7)
MCV: 87 fL (ref 79–97)
Platelets: 261 10*3/uL (ref 150–450)
RBC: 4.88 x10E6/uL (ref 4.14–5.80)
RDW: 13.2 % (ref 11.6–15.4)
WBC: 6.3 10*3/uL (ref 3.4–10.8)

## 2018-08-22 LAB — LIPID PANEL
Chol/HDL Ratio: 3.9 ratio (ref 0.0–5.0)
Cholesterol, Total: 157 mg/dL (ref 100–199)
HDL: 40 mg/dL (ref >39)
LDL Calculated: 97 mg/dL (ref 0–99)
Triglycerides: 98 mg/dL (ref 0–149)
VLDL Cholesterol Cal: 20 mg/dL (ref 5–40)

## 2018-08-24 ENCOUNTER — Telehealth: Payer: Self-pay

## 2018-08-24 MED ORDER — ATORVASTATIN CALCIUM 40 MG PO TABS: 40.0000 mg | ORAL_TABLET | Freq: Every day | ORAL | 3 refills | Status: DC

## 2018-08-24 NOTE — Telephone Encounter (Signed)
Pt advised.  RX sent to [facility] and apt scheduled for 11/27/2018 at 8:20am   Thanks,    -Mickel Baas

## 2018-08-24 NOTE — Telephone Encounter (Signed)
-----   Message from [name], MD sent at 08/22/2018  8:04 PM EDT ----- ldl is 97, but due to stroke history needs to be below 70. Need to change lovastatin to atorvastatin 40mg  once a day, #30 rf x 3 and scheduled follow up with labs in 3 months.

## 2018-10-01 ENCOUNTER — Other Ambulatory Visit: Payer: Self-pay | Admitting: Family Medicine

## 2018-10-01 DIAGNOSIS — E7800 Pure hypercholesterolemia, unspecified: Secondary | ICD-10-CM

## 2018-10-30 ENCOUNTER — Other Ambulatory Visit: Payer: Self-pay | Admitting: Family Medicine

## 2018-11-02 ENCOUNTER — Telehealth: Payer: Self-pay | Admitting: Family Medicine

## 2018-11-02 NOTE — Telephone Encounter (Signed)
Explained to pharmacist that according to telephone encounter from 08/22/18 after reviewing lab results Dr. Caryn Section changed patients prescription from lovastatin to Atorvastastin 40mg  qd. KW

## 2018-11-02 NOTE — Telephone Encounter (Signed)
Pharmacy is calling wanting to know is patient is suppose to be on Lovastatin and Atorvastatin  Con Memos

## 2018-11-26 NOTE — Progress Notes (Signed)
Patient: Jon Mejia Male    DOB: Mar 28, 1940   78 y.o.   MRN: [id number] Visit Date: 11/27/2018  Today's Provider: [name], MD   Chief Complaint  Patient presents with  . Hyperlipidemia   Subjective:     HPI  Lipid/Cholesterol, Follow-up:   Last seen for this 3 months ago.  Management changes since that visit include changing Lovastatin to Atorvastatin 40mg  once a day with LDL goal <70  Last Lipid Panel:    Component Value Date/Time   CHOL 157 08/21/2018 0332   TRIG 98 08/21/2018 0332   HDL 40 08/21/2018 0332   CHOLHDL 3.9 08/21/2018 0332   CHOLHDL 4.5 03/14/2018 0711   VLDL 18 03/14/2018 0711   LDLCALC 97 08/21/2018 0332   LDLCALC 119 (H) 01/09/2017 0906    Risk factors for vascular disease include hypercholesterolemia and hypertension  He reports good compliance with treatment. He is not having side effects.  Current symptoms include none  Weight trend: stable Prior visit with dietician: no Current diet: well balanced Current exercise: walking  Wt Readings from Last 3 Encounters:  11/27/18 180 lb (81.6 kg)  08/21/18 176 lb (79.8 kg)  03/26/18 190 lb (86.2 kg)    -------------------------------------------------------------------  No Known Allergies   Current Outpatient Medications:  .  amLODipine (NORVASC) 5 MG tablet, Take 1 tablet (5 mg total) by mouth daily., Disp: 90 tablet, Rfl: 3 .  apixaban (ELIQUIS) 5 MG TABS tablet, Take 1 tablet (5 mg total) by mouth 2 (two) times daily., Disp: 60 tablet, Rfl: 6 .  atorvastatin (LIPITOR) 40 MG tablet, TAKE ONE TABLET EVERY DAY, Disp: 90 tablet, Rfl: 4 .  diclofenac sodium (VOLTAREN) 1 % GEL, Apply 2 g topically 4 (four) times daily., Disp: 100 g, Rfl: 2 .  meloxicam (MOBIC) 15 MG tablet, take 1 tablet by mouth once daily after meals if needed for BACK PAIN (Patient taking differently: Take 15 mg by mouth daily as needed (back pain). ), Disp: 30 tablet, Rfl: 2 .  metoprolol succinate  (TOPROL-XL) 25 MG 24 hr tablet, Take 1 tablet (25 mg total) by mouth daily. Take with or immediately following a meal., Disp: 90 tablet, Rfl: 3  Review of Systems  Constitutional: Negative for appetite change, chills and fever.  Respiratory: Negative for chest tightness, shortness of breath and wheezing.   Cardiovascular: Negative for chest pain and palpitations.  Gastrointestinal: Negative for abdominal pain, nausea and vomiting.    Social History   Tobacco Use  . Smoking status: Former Smoker    Packs/day: 1.00    Years: 20.00    Pack years: 20.00    Quit date: 03/12/1983    Years since quitting: 35.7  . Smokeless tobacco: Never Used  Substance Use Topics  . Alcohol use: No      Objective:   BP 133/72 (BP Location: Right Arm, Patient Position: Sitting, Cuff Size: Normal)   Pulse 62   Temp (!) 97.1 F (36.2 C) (Temporal)   Wt 180 lb (81.6 kg)   BMI 29.95 kg/m  Vitals:   11/27/18 0820  BP: 133/72  Pulse: 62  Temp: (!) 97.1 F (36.2 C)  TempSrc: Temporal  Weight: 180 lb (81.6 kg)  Body mass index is 29.95 kg/m.   Physical Exam   General Appearance:    Well developed, well nourished male in no acute distress  Eyes:    PERRL, conjunctiva/corneas clear, EOM's intact       Lungs:  Clear to auscultation bilaterally, respirations unlabored  Heart:    Normal heart rate. Normal rhythm.  3/6 harsh, crescendo-decrescendo, systolic murmur at right upper sternal border and has 3/6 mid-systolic murmur at second left intercostal space  MS:   All extremities are intact.   Neurologic:   Awake, alert, oriented x 3. No apparent focal neurological           defect.          Assessment & Plan    1. Pure hypercholesterolemia Tolerating change to high intensity statin, atorvastatin 40.  - Comprehensive metabolic panel - Lipid panel  2. Hyperglycemia  - Hemoglobin A1c  3. Need for influenza vaccination  - Flu Vaccine QUAD High Dose(Fluad)   The entirety of the  information documented in the History of Present Illness, Review of Systems and Physical Exam were personally obtained by me. Portions of this information were initially documented by Idelle Jo, CMA and reviewed by me for thoroughness and accuracy.     [name], MD  [facility]

## 2018-11-27 ENCOUNTER — Other Ambulatory Visit: Payer: Self-pay | Admitting: Cardiovascular Disease

## 2018-11-27 ENCOUNTER — Other Ambulatory Visit: Payer: Self-pay

## 2018-11-27 ENCOUNTER — Ambulatory Visit (INDEPENDENT_AMBULATORY_CARE_PROVIDER_SITE_OTHER): Payer: Medicare Other | Admitting: Family Medicine

## 2018-11-27 ENCOUNTER — Encounter: Payer: Self-pay | Admitting: Family Medicine

## 2018-11-27 VITALS — BP 133/72 | HR 62 | Temp 97.1°F | Wt 180.0 lb

## 2018-11-27 DIAGNOSIS — E7800 Pure hypercholesterolemia, unspecified: Secondary | ICD-10-CM

## 2018-11-27 DIAGNOSIS — R739 Hyperglycemia, unspecified: Secondary | ICD-10-CM | POA: Diagnosis not present

## 2018-11-27 DIAGNOSIS — Z23 Encounter for immunization: Secondary | ICD-10-CM

## 2018-11-27 NOTE — Patient Instructions (Signed)
.   Please review the attached list of medications and notify my office if there are any errors.   . Please bring all of your medications to every appointment so we can make sure that our medication list is the same as yours.   . It is especially important to get the annual flu vaccine this year. If you haven't had it already, please go to your pharmacy or call the office as soon as possible to schedule you flu shot.  

## 2018-11-27 NOTE — Telephone Encounter (Signed)
Eliquis 5mg  refill request received. Pt is 78 yrs old, weight-81.6kg, Crea-1.01 on 08/21/2018, Diagnosis-CVA and possible Afib per Dr. Rockey Situ last OV note, and last seen by Dr. Rockey Situ on 03/16/2018. Dose is appropriate based on dosing criteria. Will send in refill to requested pharmacy.

## 2018-11-27 NOTE — Telephone Encounter (Signed)
Please review for refill. Thanks!  

## 2018-11-28 LAB — COMPREHENSIVE METABOLIC PANEL
ALT: 21 IU/L (ref 0–44)
AST: 23 IU/L (ref 0–40)
Albumin/Globulin Ratio: 1.8 (ref 1.2–2.2)
Albumin: 4.2 g/dL (ref 3.7–4.7)
Alkaline Phosphatase: 97 IU/L (ref 39–117)
BUN/Creatinine Ratio: 12 (ref 10–24)
BUN: 13 mg/dL (ref 8–27)
Bilirubin Total: 0.6 mg/dL (ref 0.0–1.2)
CO2: 22 mmol/L (ref 20–29)
Calcium: 9.3 mg/dL (ref 8.6–10.2)
Chloride: 106 mmol/L (ref 96–106)
Creatinine, Ser: 1.05 mg/dL (ref 0.76–1.27)
GFR calc Af Amer: 79 mL/min/{1.73_m2} (ref >59)
GFR calc non Af Amer: 69 mL/min/{1.73_m2} (ref >59)
Globulin, Total: 2.4 g/dL (ref 1.5–4.5)
Glucose: 105 mg/dL — ABNORMAL HIGH (ref 65–99)
Potassium: 4.3 mmol/L (ref 3.5–5.2)
Sodium: 139 mmol/L (ref 134–144)
Total Protein: 6.6 g/dL (ref 6.0–8.5)

## 2018-11-28 LAB — HEMOGLOBIN A1C
Est. average glucose Bld gHb Est-mCnc: 126 mg/dL
Hgb A1c MFr Bld: 6 % — ABNORMAL HIGH (ref 4.8–5.6)

## 2018-11-28 LAB — LIPID PANEL
Chol/HDL Ratio: 3.3 ratio (ref 0.0–5.0)
Cholesterol, Total: 129 mg/dL (ref 100–199)
HDL: 39 mg/dL — ABNORMAL LOW (ref >39)
LDL Chol Calc (NIH): 77 mg/dL (ref 0–99)
Triglycerides: 59 mg/dL (ref 0–149)
VLDL Cholesterol Cal: 13 mg/dL (ref 5–40)

## 2019-02-17 ENCOUNTER — Ambulatory Visit: Payer: PPO | Attending: Internal Medicine

## 2019-02-17 DIAGNOSIS — Z23 Encounter for immunization: Secondary | ICD-10-CM | POA: Insufficient documentation

## 2019-03-10 ENCOUNTER — Ambulatory Visit: Payer: PPO | Attending: Internal Medicine

## 2019-03-10 DIAGNOSIS — Z23 Encounter for immunization: Secondary | ICD-10-CM | POA: Insufficient documentation

## 2019-03-10 NOTE — Progress Notes (Signed)
   Covid-19 Vaccination Clinic  Name:  Jon Mejia    MRN: [id number] DOB: September 10, 1940  03/10/2019  Jon Mejia was observed post Covid-19 immunization for 15 minutes without incidence. He was provided with Vaccine Information Sheet and instruction to access the V-Safe system.   Jon Mejia was instructed to call 911 with any severe reactions post vaccine: Marland Kitchen Difficulty breathing  . Swelling of your face and throat  . A fast heartbeat  . A bad rash all over your body  . Dizziness and weakness    Immunizations Administered    Name Date Dose VIS Date Route   Pfizer COVID-19 Vaccine 03/10/2019  3:13 PM 0.3 mL 01/08/2019 Intramuscular   Manufacturer: [employer], [employer]   Lot: [id number]   [place]: [id number]

## 2019-03-16 ENCOUNTER — Encounter: Payer: Self-pay | Admitting: Cardiovascular Disease

## 2019-03-16 ENCOUNTER — Other Ambulatory Visit: Payer: Self-pay

## 2019-03-16 ENCOUNTER — Ambulatory Visit (INDEPENDENT_AMBULATORY_CARE_PROVIDER_SITE_OTHER): Payer: PPO | Admitting: Cardiovascular Disease

## 2019-03-16 VITALS — BP 140/70 | HR 62 | Ht 65.0 in | Wt 183.8 lb

## 2019-03-16 DIAGNOSIS — I480 Paroxysmal atrial fibrillation: Secondary | ICD-10-CM | POA: Diagnosis not present

## 2019-03-16 DIAGNOSIS — I350 Nonrheumatic aortic (valve) stenosis: Secondary | ICD-10-CM | POA: Diagnosis not present

## 2019-03-16 MED ORDER — AMLODIPINE BESYLATE 5 MG PO TABS: 5.0000 mg | ORAL_TABLET | Freq: Every day | ORAL | 3 refills | Status: DC

## 2019-03-16 MED ORDER — METOPROLOL SUCCINATE ER 25 MG PO TB24: 25.0000 mg | ORAL_TABLET | Freq: Every day | ORAL | 3 refills | Status: DC

## 2019-03-16 MED ORDER — APIXABAN 5 MG PO TABS: 5.0000 mg | ORAL_TABLET | Freq: Two times a day (BID) | ORAL | 4 refills | Status: DC

## 2019-03-16 NOTE — Patient Instructions (Addendum)
Medication Instructions:  No changes  If you need a refill on your cardiac medications before your next appointment, please call your pharmacy.    Lab work: No new labs needed   If you have labs (blood work) drawn today and your tests are completely normal, you will receive your results only by: Marland Kitchen MyChart Message (if you have MyChart) OR . A paper copy in the mail If you have any lab test that is abnormal or we need to change your treatment, we will call you to review the results.   Testing/Procedures: Echo in 6 months for aortic valve stenosis, mitral valve disease  Your physician has requested that you have an echocardiogram. Echocardiography is a painless test that uses sound waves to create images of your heart. It provides your doctor with information about the size and shape of your heart and how well your heart's chambers and valves are working. This procedure takes approximately one hour. There are no restrictions for this procedure.     Follow-Up: At [facility], you and your health needs are our priority.  As part of our continuing mission to provide you with exceptional heart care, we have created designated Provider Care Teams.  These Care Teams include your primary Cardiologist (physician) and Advanced Practice Providers (APPs -  Physician Assistants and Nurse Practitioners) who all work together to provide you with the care you need, when you need it.  . You will need a follow up appointment in 12 months   . Providers on your designated Care Team:   . Murray Hodgkins, NP . Christell Faith, PA-C . Marrianne Mood, PA-C  Any Other Special Instructions Will Be Listed Below (If Applicable).  For educational health videos Log in to : [email or web address] Or : [email or web address], password : [email or web address]

## 2019-03-16 NOTE — Progress Notes (Signed)
Cardiology Office Note  Date:  03/16/2019   ID:  Jon Mejia, DOB: 1940/02/13, MRN: [id number]  PCP:  [name], MD   Chief Complaint  Patient presents with  . office visit    12 mo F/U; Meds verbally reviewed with patient.    HPI:  Jon Mejia is a 79 y.o. male who has a PMHx of: Stroke Mild-moderate  aortic sclerosis Pure hypercholesterolemia HTN Premature atrial beats quit smoking about 34 years ago. His smoking use included cigarettes. He has a 20.00 pack-year smoking history. 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 He initially had complete vision loss, but eventually was just seeing shadows. Never happened before. He denied numbness, tingling, or focal weakness. He denies headache. His wife states he has been tired recently and last night was feeling dizzy. Code stroke called on presentation and patient taken given alteplase.    He was diagnosed with acute stroke symptoms and was admitted to ICU per neurology for monitoring. The patient was discharged 03/15/2018.   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 Ubaldo Glassing 01/09/2018 abnormal electrocardiogram at his primary care provider's office. This revealed sinus rhythm with atrial bigeminy. He has had an echo in the past in 2017 which revealed normal LV function with trileaflet but moderately calcified aortic valve with sclerosis no stenosis.   Holter showed sinus rhythm with frequent PACs with greater than 10% burden. There were no sustained ventricular or supraventricular arrhythmias  MR Brain WO Contrast on 03/11/2018 showed: 1. Punctate acute infarct in the posterior right frontal lobe. 2. Small chronic cerebellar infarct.  CT neck on 03/13/2018 showed: 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 On the right, there is soft and calcified plaque. Maximal stenosis in the right ICA bulb is 30%. No measurable stenosis on the left.  50% stenosis of the left vertebral artery origin.  No intracranial large or medium vessel occlusion.  Flow is present in both ophthalmic arteries.  Mild fusiform dilatation at the right MCA bifurcation but no measurable berry aneurysm.  Distal vessel atherosclerotic irregularity in the anterior, middle and posterior cerebral artery branches.   PMH:   has a past medical history of CVA (cerebral vascular accident) ([place]) (03/20/2018), History of chicken pox, and History of measles.  PSH:    Past  Surgical History:  Procedure Laterality Date  . CARDIAC CATHETERIZATION  11/2017   No stents/Fath    Current Outpatient Medications  Medication Sig Dispense Refill  . amLODipine (NORVASC) 5 MG tablet Take 1 tablet (5 mg total) by mouth daily. 90 tablet 3  . atorvastatin (LIPITOR) 40 MG tablet TAKE ONE TABLET EVERY DAY 90 tablet 4  .  diclofenac sodium (VOLTAREN) 1 % GEL Apply 2 g topically 4 (four) times daily. 100 g 2  . ELIQUIS 5 MG TABS tablet TAKE ONE TABLET TWICE DAILY 60 tablet 6  . meloxicam (MOBIC) 15 MG tablet take 1 tablet by mouth once daily after meals if needed for BACK PAIN (Patient taking differently: Take 15 mg by mouth daily as needed (back pain). ) 30 tablet 2  . metoprolol succinate (TOPROL-XL) 25 MG 24 hr tablet Take 1 tablet (25 mg total) by mouth daily. Take with or immediately following a meal. 90 tablet 3   No current facility-administered medications for this visit.     ALLERGIES:   Patient has no known allergies.   SOCIAL HISTORY:  The patient  reports that he quit smoking about 36 years ago. He has a 20.00 pack-year smoking history. He has never used smokeless tobacco. He reports that he does not drink alcohol or use drugs.   FAMILY HISTORY:   family history includes Arrhythmia in his mother; Diabetes in his son.    REVIEW OF SYSTEMS: Review of Systems  Constitutional: Negative.   HENT: Negative.   Eyes: Negative.   Respiratory: Negative.  Negative for shortness of breath.   Cardiovascular: Negative.  Negative for chest pain.  Gastrointestinal: Negative.   Genitourinary: Negative.   Musculoskeletal: Negative.   Neurological: Negative.   Psychiatric/Behavioral: Negative.   All other systems reviewed and are negative.   PHYSICAL EXAM: VS:  BP 140/70 (BP Location: Left Arm, Patient Position: Sitting, Cuff Size: Normal)   Pulse 62   Ht 5\' 5"  (1.651 m)   Wt 183 lb 12 oz (83.3 kg)   SpO2 95%   BMI 30.58 kg/m  , BMI Body mass index is 30.58 kg/m.  Constitutional:  oriented to person, place, and time. No distress.  HENT:  Head: Normocephalic and atraumatic.  Eyes:  no discharge. No scleral icterus.  Neck: Normal range of motion. Neck supple. No JVD present.  Cardiovascular: Normal rate, regular rhythm, normal heart sounds and intact distal pulses. Exam reveals no gallop and no  friction rub. No edema No murmur heard. Pulmonary/Chest: Effort normal and breath sounds normal. No stridor. No respiratory distress.  no wheezes.  no rales.  no tenderness.  Abdominal: Soft.  no distension.  no tenderness.  Musculoskeletal: Normal range of motion.  no  tenderness or deformity.  Neurological:  normal muscle tone. Coordination normal. No atrophy Skin: Skin is warm and dry. No rash noted. not diaphoretic.  Psychiatric:  normal mood and affect. behavior is normal. Thought content normal.    RECENT LABS: 08/21/2018: Hemoglobin 14.4; Platelets 261 11/27/2018: ALT 21; BUN 13; Creatinine, Ser 1.05; Potassium 4.3; Sodium 139    LIPID PANEL: Lab Results  Component Value Date   CHOL 129 11/27/2018   HDL 39 (L) 11/27/2018   LDLCALC 77 11/27/2018   TRIG 59 11/27/2018      WEIGHT: Wt Readings from Last 3 Encounters:  03/16/19 183 lb 12 oz (83.3 kg)  11/27/18 180 lb (81.6 kg)  08/21/18 176 lb (79.8 kg)     ASSESSMENT AND PLAN:  History of stroke History of paroxysmal atrial fibrillation, previously documented February 2020 No further TIA or stroke symptoms on Eliquis 5 twice daily Denies any tachycardia or palpitations concerning for paroxysmal arrhythmia though he was asymptomatic previously Continue metoprolol at current dose  Paroxysmal atrial fibrillation As documented above, no medication changes made  Essential hypertension Blood pressure is well controlled on today's visit. No changes made to the medications.  Hyperlipidemia  Cholesterol is at goal on the current lipid regimen. No changes to the medications were made.  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 03/16/2019  [place], [place]

## 2019-08-24 NOTE — Progress Notes (Signed)
Subjective:   Jon Mejia is a 79 y.o. male who presents for Medicare Annual/Subsequent preventive examination.    This visit is being conducted through telemedicine due to the COVID-19 pandemic. This patient has given me verbal consent via doximity to conduct this visit, patient states they are participating from their home address. Some vital signs may be absent or patient reported.    Patient identification: identified by name, DOB, and current address  Review of Systems    N/A  Cardiac Risk Factors include: advanced age (>66men, >86 women);dyslipidemia;male gender;hypertension     Objective:    Today's Vitals   08/25/19 0921 08/25/19 0940  BP: (!) 152/68 (!) 130/66  Pulse: 64   Temp: (!) 97.3 F (36.3 C)   TempSrc: Temporal   SpO2: 95%   Weight: 183 lb 12.8 oz (83.4 kg)   Height: 5\' 6"  (1.676 m)   PainSc: 0-No pain    Body mass index is 29.67 kg/m.  Advanced Directives 08/25/2019 03/26/2018 03/13/2018 04/29/2017  Does Patient Have a Medical Advance Directive? Yes Yes No Yes  Type of Paramedic of [place];Living will [place];Living will - [place];Living will  Does patient want to make changes to medical advance directive? - No - Patient declined - -  Copy of [place] in Chart? No - copy requested No - copy requested - No - copy requested  Would patient like information on creating a medical advance directive? - - Yes (Inpatient - patient defers creating a medical advance directive at this time);No - Patient declined -    Current Medications (verified) Outpatient Encounter Medications as of 08/25/2019  Medication Sig  . amLODipine (NORVASC) 5 MG tablet Take 1 tablet (5 mg total) by mouth daily.  Marland Kitchen apixaban (ELIQUIS) 5 MG TABS tablet Take 1 tablet (5 mg total) by mouth 2 (two) times daily.  Marland Kitchen atorvastatin (LIPITOR) 40 MG tablet TAKE ONE TABLET EVERY DAY  . diclofenac sodium  (VOLTAREN) 1 % GEL Apply 2 g topically 4 (four) times daily.  . meloxicam (MOBIC) 15 MG tablet take 1 tablet by mouth once daily after meals if needed for BACK PAIN (Patient taking differently: Take 15 mg by mouth daily as needed (back pain). )  . metoprolol succinate (TOPROL-XL) 25 MG 24 hr tablet Take 1 tablet (25 mg total) by mouth daily. Take with or immediately following a meal.   No facility-administered encounter medications on file as of 08/25/2019.    Allergies (verified) Patient has no known allergies.   History: Past Medical History:  Diagnosis Date  . CVA (cerebral vascular accident) ([place]) 03/20/2018  . History of chicken pox   . History of measles   . Hyperlipidemia   . Hypertension    Past Surgical History:  Procedure Laterality Date  . CARDIAC CATHETERIZATION  11/2017   No stents/Fath   Family History  Problem Relation Age of Onset  . Arrhythmia Mother        A-Fib  . Diabetes Son   . Colon cancer Neg Hx   . Prostate cancer Neg Hx    Social History   Socioeconomic History  . Marital status: Married    Spouse name: [name]  . Number of children: 2  . Years of education: Not on file  . Highest education level: 12th grade  Occupational History  . Occupation: Retired  Tobacco Use  . Smoking status: Former Smoker    Packs/day: 1.00  Years: 20.00    Pack years: 20.00    Quit date: 03/12/1983    Years since quitting: 36.4  . Smokeless tobacco: Never Used  Vaping Use  . Vaping Use: Never used  Substance and Sexual Activity  . Alcohol use: No  . Drug use: No  . Sexual activity: Not on file  Other Topics Concern  . Not on file  Social History Narrative  . Not on file   Social Determinants of Health   Financial Resource Strain: Low Risk   . Difficulty of Paying Living Expenses: Not hard at all  Food Insecurity: No Food Insecurity  . Worried About Charity fundraiser in the Last Year: Never true  . Ran Out of Food in the Last Year: Never true    Transportation Needs: No Transportation Needs  . Lack of Transportation (Medical): No  . Lack of Transportation (Non-Medical): No  Physical Activity: Inactive  . Days of Exercise per Week: 0 days  . Minutes of Exercise per Session: 0 min  Stress: No Stress Concern Present  . Feeling of Stress : Not at all  Social Connections: Moderately Integrated  . Frequency of Communication with Friends and Family: Three times a week  . Frequency of Social Gatherings with Friends and Family: More than three times a week  . Attends Religious Services: More than 4 times per year  . Active Member of Clubs or Organizations: No  . Attends Archivist Meetings: Never  . Marital Status: Married    Tobacco Counseling Counseling given: Not Answered   Clinical Intake:  Pre-visit preparation completed: Yes  Pain : No/denies pain Pain Score: 0-No pain     Nutritional Status: BMI 25 -29 Overweight Nutritional Risks: None Diabetes: No  How often do you need to have someone help you when you read instructions, pamphlets, or other written materials from your doctor or pharmacy?: 1 - Never  Diabetic? No  Interpreter Needed?: No  Information entered by :: [facility], LPN   Activities of Daily Living In your present state of health, do you have any difficulty performing the following activities: 08/25/2019  Hearing? N  Vision? N  Difficulty concentrating or making decisions? N  Walking or climbing stairs? N  Dressing or bathing? N  Doing errands, shopping? N  Preparing Food and eating ? N  Using the Toilet? N  In the past six months, have you accidently leaked urine? N  Do you have problems with loss of bowel control? N  Managing your Medications? N  Managing your Finances? N  Housekeeping or managing your Housekeeping? N  Some recent data might be hidden    Patient Care Team: [name], MD as PCP - General (Family Medicine) [name], [place] as Consulting Physician  (Optometry) Rockey Situ, Kathlene November, MD as Consulting Physician (Cardiology)  Indicate any recent Medical Services you may have received from other than [facility] providers in the past year (date may be approximate).     Assessment:   This is a routine wellness examination for [facility].  Hearing/Vision screen No exam data present  Dietary issues and exercise activities discussed: Current Exercise Habits: The patient does not participate in regular exercise at present, Exercise limited by: respiratory conditions(s)  Goals    . Exercise 3x per week (30 min per time)     Recommend to walk 3 days a week for 30 minutes at a time without stopping.       Depression Screen PHQ 2/9 Scores 08/25/2019  08/21/2018 04/29/2017 04/29/2017 01/25/2016 01/24/2015  PHQ - 2 Score 0 0 0 0 0 0  PHQ- 9 Score - 0 0 - - -    Fall Risk Fall Risk  08/25/2019 08/21/2018 04/29/2017 01/25/2016 01/24/2015  Falls in the past year? 0 0 No No No  Number falls in past yr: 0 0 - - -  Injury with Fall? 0 0 - - -  Follow up - Falls evaluation completed - - -    Any stairs in or around the home? Yes  If so, are there any without handrails? No  Home free of loose throw rugs in walkways, pet beds, electrical cords, etc? Yes  Adequate lighting in your home to reduce risk of falls? Yes   ASSISTIVE DEVICES UTILIZED TO PREVENT FALLS:  Life alert? No  Use of a cane, walker or w/c? No  Grab bars in the bathroom? Yes  Shower chair or bench in shower? No  Elevated toilet seat or a handicapped toilet? No   TIMED UP AND GO:  Was the test performed? Yes .  Length of time to ambulate 10 feet: 8 sec.   Gait steady and fast without use of assistive device  Cognitive Function: Declined today.         Immunizations Immunization History  Administered Date(s) Administered  . Fluad Quad(high Dose 65+) 11/27/2018  . Influenza, High Dose Seasonal PF 01/17/2014, 01/25/2016, 01/09/2017, 12/05/2017  . Influenza,inj,Quad PF,6+ Mos 01/24/2015   . PFIZER SARS-COV-2 Vaccination 02/17/2019, 03/10/2019  . Pneumococcal Conjugate-13 07/12/2013  . Pneumococcal Polysaccharide-23 01/07/2008  . Td 12/15/2003  . Tdap 08/15/2015    TDAP status: Up to date Flu Vaccine status: Up to date Pneumococcal vaccine status: Up to date Covid-19 vaccine status: Completed vaccines  Qualifies for Shingles Vaccine? Yes   Zostavax completed No   Shingrix Completed?: No.    Education has been provided regarding the importance of this vaccine. Patient has been advised to call insurance company to determine out of pocket expense if they have not yet received this vaccine. Advised may also receive vaccine at local pharmacy or Health Dept. Verbalized acceptance and understanding.  Screening Tests Health Maintenance  Topic Date Due  . INFLUENZA VACCINE  08/29/2019  . TETANUS/TDAP  08/14/2025  . COVID-19 Vaccine  Completed  . PNA vac Low Risk Adult  Completed    Health Maintenance  There are no preventive care reminders to display for this patient.  Colorectal cancer screening: No longer required.   Lung Cancer Screening: (Low Dose CT Chest recommended if Age 54-80 years, 30 pack-year currently smoking OR have quit w/in 15years.) does not qualify.   Additional Screening:  Vision Screening: Recommended annual ophthalmology exams for early detection of glaucoma and other disorders of the eye. Is the patient up to date with their annual eye exam?  Yes  Who is the provider or what is the name of the office in which the patient attends annual eye exams? Dr Rick Duff @ [facility] If pt is not established with a provider, would they like to be referred to a provider to establish care? No .   Dental Screening: Recommended annual dental exams for proper oral hygiene  Community Resource Referral / Chronic Care Management: CRR required this visit?  No   CCM required this visit?  No      Plan:     I have personally reviewed and noted the following in the  patient's chart:   . Medical and social history . Use  of alcohol, tobacco or illicit drugs  . Current medications and supplements . Functional ability and status . Nutritional status . Physical activity . Advanced directives . List of other physicians . Hospitalizations, surgeries, and ER visits in previous 12 months . Vitals . Screenings to include cognitive, depression, and falls . Referrals and appointments  In addition, I have reviewed and discussed with patient certain preventive protocols, quality metrics, and best practice recommendations. A written personalized care plan for preventive services as well as general preventive health recommendations were provided to patient.     Josephine Rudnick [address], [place]   07/26/2415   Nurse Notes: None.

## 2019-08-25 ENCOUNTER — Ambulatory Visit (INDEPENDENT_AMBULATORY_CARE_PROVIDER_SITE_OTHER): Payer: PPO

## 2019-08-25 ENCOUNTER — Ambulatory Visit (INDEPENDENT_AMBULATORY_CARE_PROVIDER_SITE_OTHER): Payer: PPO | Admitting: Family Medicine

## 2019-08-25 ENCOUNTER — Other Ambulatory Visit: Payer: Self-pay

## 2019-08-25 ENCOUNTER — Encounter: Payer: Self-pay | Admitting: Family Medicine

## 2019-08-25 VITALS — BP 130/66 | HR 64 | Temp 97.3°F | Ht 66.0 in | Wt 183.8 lb

## 2019-08-25 VITALS — BP 152/68 | HR 64 | Temp 97.3°F | Ht 64.0 in | Wt 183.8 lb

## 2019-08-25 DIAGNOSIS — R739 Hyperglycemia, unspecified: Secondary | ICD-10-CM | POA: Diagnosis not present

## 2019-08-25 DIAGNOSIS — Z125 Encounter for screening for malignant neoplasm of prostate: Secondary | ICD-10-CM

## 2019-08-25 DIAGNOSIS — E7800 Pure hypercholesterolemia, unspecified: Secondary | ICD-10-CM

## 2019-08-25 DIAGNOSIS — Z23 Encounter for immunization: Secondary | ICD-10-CM | POA: Diagnosis not present

## 2019-08-25 DIAGNOSIS — I10 Essential (primary) hypertension: Secondary | ICD-10-CM | POA: Diagnosis not present

## 2019-08-25 DIAGNOSIS — R0600 Dyspnea, unspecified: Secondary | ICD-10-CM | POA: Diagnosis not present

## 2019-08-25 DIAGNOSIS — R5383 Other fatigue: Secondary | ICD-10-CM

## 2019-08-25 DIAGNOSIS — Z0000 Encounter for general adult medical examination without abnormal findings: Secondary | ICD-10-CM

## 2019-08-25 NOTE — Patient Instructions (Signed)
Jon Mejia , Thank you for taking time to come for your Medicare Wellness Visit. I appreciate your ongoing commitment to your health goals. Please review the following plan we discussed and let me know if I can assist you in the future.   Screening recommendations/referrals: Colonoscopy: No longer required.  Recommended yearly ophthalmology/optometry visit for glaucoma screening and checkup Recommended yearly dental visit for hygiene and checkup  Vaccinations: Influenza vaccine: Done 11/27/18 Pneumococcal vaccine: Completed series Tdap vaccine: Up to date, due 08/15/15 Shingles vaccine: Shingrix discussed. Please contact your pharmacy for coverage information.     Advanced directives: Please bring a copy of your POA (Power of Attorney) and/or Living Will to your next appointment.   Conditions/risks identified: Recommend to start back walking 3 days a week for at least 30 minutes at a time.   Next appointment: 10:00 AM today with Dr Caryn Section   Preventive Care 77 Years and Older, Male Preventive care refers to lifestyle choices and visits with your health care provider that can promote health and wellness. What does preventive care include?  A yearly physical exam. This is also called an annual well check.  Dental exams once or twice a year.  Routine eye exams. Ask your health care provider how often you should have your eyes checked.  Personal lifestyle choices, including:  Daily care of your teeth and gums.  Regular physical activity.  Eating a healthy diet.  Avoiding tobacco and drug use.  Limiting alcohol use.  Practicing safe sex.  Taking low doses of aspirin every day.  Taking vitamin and mineral supplements as recommended by your health care provider. What happens during an annual well check? The services and screenings done by your health care provider during your annual well check will depend on your age, overall health, lifestyle risk factors, and family history  of disease. Counseling  Your health care provider may ask you questions about your:  Alcohol use.  Tobacco use.  Drug use.  Emotional well-being.  Home and relationship well-being.  Sexual activity.  Eating habits.  History of falls.  Memory and ability to understand (cognition).  Work and work Statistician. Screening  You may have the following tests or measurements:  Height, weight, and BMI.  Blood pressure.  Lipid and cholesterol levels. These may be checked every 5 years, or more frequently if you are over 43 years old.  Skin check.  Lung cancer screening. You may have this screening every year starting at age 88 if you have a 30-pack-year history of smoking and currently smoke or have quit within the past 15 years.  Fecal occult blood test (FOBT) of the stool. You may have this test every year starting at age 29.  Flexible sigmoidoscopy or colonoscopy. You may have a sigmoidoscopy every 5 years or a colonoscopy every 10 years starting at age 56.  Prostate cancer screening. Recommendations will vary depending on your family history and other risks.  Hepatitis C blood test.  Hepatitis B blood test.  Sexually transmitted disease (STD) testing.  Diabetes screening. This is done by checking your blood sugar (glucose) after you have not eaten for a while (fasting). You may have this done every 1-3 years.  Abdominal aortic aneurysm (AAA) screening. You may need this if you are a current or former smoker.  Osteoporosis. You may be screened starting at age 41 if you are at high risk. Talk with your health care provider about your test results, treatment options, and if necessary, the need for  more tests. Vaccines  Your health care provider may recommend certain vaccines, such as:  Influenza vaccine. This is recommended every year.  Tetanus, diphtheria, and acellular pertussis (Tdap, Td) vaccine. You may need a Td booster every 10 years.  Zoster vaccine. You may  need this after age 37.  Pneumococcal 13-valent conjugate (PCV13) vaccine. One dose is recommended after age 89.  Pneumococcal polysaccharide (PPSV23) vaccine. One dose is recommended after age 9. Talk to your health care provider about which screenings and vaccines you need and how often you need them. This information is not intended to replace advice given to you by your health care provider. Make sure you discuss any questions you have with your health care provider. Document Released: 02/10/2015 Document Revised: 10/04/2015 Document Reviewed: 11/15/2014 Elsevier Interactive Patient Education  2017 [place] Prevention in the Home Falls can cause injuries. They can happen to people of all ages. There are many things you can do to make your home safe and to help prevent falls. What can I do on the outside of my home?  Regularly fix the edges of walkways and driveways and fix any cracks.  Remove anything that might make you trip as you walk through a door, such as a raised step or threshold.  Trim any bushes or trees on the path to your home.  Use bright outdoor lighting.  Clear any walking paths of anything that might make someone trip, such as rocks or tools.  Regularly check to see if handrails are loose or broken. Make sure that both sides of any steps have handrails.  Any raised decks and porches should have guardrails on the edges.  Have any leaves, snow, or ice cleared regularly.  Use sand or salt on walking paths during winter.  Clean up any spills in your garage right away. This includes oil or grease spills. What can I do in the bathroom?  Use night lights.  Install grab bars by the toilet and in the tub and shower. Do not use towel bars as grab bars.  Use non-skid mats or decals in the tub or shower.  If you need to sit down in the shower, use a plastic, non-slip stool.  Keep the floor dry. Clean up any water that spills on the floor as soon as it  happens.  Remove soap buildup in the tub or shower regularly.  Attach bath mats securely with double-sided non-slip rug tape.  Do not have throw rugs and other things on the floor that can make you trip. What can I do in the bedroom?  Use night lights.  Make sure that you have a light by your bed that is easy to reach.  Do not use any sheets or blankets that are too big for your bed. They should not hang down onto the floor.  Have a firm chair that has side arms. You can use this for support while you get dressed.  Do not have throw rugs and other things on the floor that can make you trip. What can I do in the kitchen?  Clean up any spills right away.  Avoid walking on wet floors.  Keep items that you use a lot in easy-to-reach places.  If you need to reach something above you, use a strong step stool that has a grab bar.  Keep electrical cords out of the way.  Do not use floor polish or wax that makes floors slippery. If you must use wax, use non-skid  floor wax.  Do not have throw rugs and other things on the floor that can make you trip. What can I do with my stairs?  Do not leave any items on the stairs.  Make sure that there are handrails on both sides of the stairs and use them. Fix handrails that are broken or loose. Make sure that handrails are as long as the stairways.  Check any carpeting to make sure that it is firmly attached to the stairs. Fix any carpet that is loose or worn.  Avoid having throw rugs at the top or bottom of the stairs. If you do have throw rugs, attach them to the floor with carpet tape.  Make sure that you have a light switch at the top of the stairs and the bottom of the stairs. If you do not have them, ask someone to add them for you. What else can I do to help prevent falls?  Wear shoes that:  Do not have high heels.  Have rubber bottoms.  Are comfortable and fit you well.  Are closed at the toe. Do not wear sandals.  If you  use a stepladder:  Make sure that it is fully opened. Do not climb a closed stepladder.  Make sure that both sides of the stepladder are locked into place.  Ask someone to hold it for you, if possible.  Clearly mark and make sure that you can see:  Any grab bars or handrails.  First and last steps.  Where the edge of each step is.  Use tools that help you move around (mobility aids) if they are needed. These include:  Canes.  Walkers.  Scooters.  Crutches.  Turn on the lights when you go into a dark area. Replace any light bulbs as soon as they burn out.  Set up your furniture so you have a clear path. Avoid moving your furniture around.  If any of your floors are uneven, fix them.  If there are any pets around you, be aware of where they are.  Review your medicines with your doctor. Some medicines can make you feel dizzy. This can increase your chance of falling. Ask your doctor what other things that you can do to help prevent falls. This information is not intended to replace advice given to you by your health care provider. Make sure you discuss any questions you have with your health care provider. Document Released: 11/10/2008 Document Revised: 06/22/2015 Document Reviewed: 02/18/2014 Elsevier Interactive Patient Education  2017 [employer].

## 2019-08-25 NOTE — Patient Instructions (Signed)
.   Please review the attached list of medications and notify my office if there are any errors.   . Please bring all of your medications to every appointment so we can make sure that our medication list is the same as yours.    The CDC recommends two doses of Shingrix (the shingles vaccine) separated by 2 to 6 months for adults age 77 years and older. I recommend checking with your pharmacy plan regarding coverage for this vaccine.      Please bring your Covid vaccine card to your next appointment so we can update your medical record

## 2019-08-25 NOTE — Progress Notes (Signed)
Complete physical exam   Patient: Jon Mejia   DOB: 1940/04/15   79 y.o. Male  MRN: [id number] Visit Date: 08/25/2019  Today's healthcare provider: [name], MD   Chief Complaint  Patient presents with  . Annual Exam  . Hyperlipidemia  . Hypertension  . Hyperglycemia   I,Latasha Walston,acting as a scribe for [name], MD.,have documented all relevant documentation on the behalf of [name], MD,as directed by  [name], MD while in the presence of [name], MD.  Subjective    Jon Mejia is a 79 y.o. male who presents today for a complete physical exam.  He reports consuming a general diet. The patient does not participate in regular exercise at present. He generally feels fairly well. He reports sleeping fairly well. He does have additional problems to discuss today. Patient is experiencing fatigue the past few months. Gets short of breath with exertion, but not at rest or when lying down. He does have echocardiogram scheduled by Dr. Rockey Situ in August. He usually sleeps a few hours at a time, but goes right back to sleep, and does not feel sleepy when he gets up in the morning.   Had AWV with [facility] today at 9:20am  HPI  Lipid/Cholesterol, Follow-up  Last lipid panel Other pertinent labs  Lab Results  Component Value Date   CHOL 129 11/27/2018   HDL 39 (L) 11/27/2018   LDLCALC 77 11/27/2018   TRIG 59 11/27/2018   CHOLHDL 3.3 11/27/2018   Lab Results  Component Value Date   ALT 21 11/27/2018   AST 23 11/27/2018   PLT 261 08/21/2018   TSH 2.930 12/03/2017     He was last seen for this 9 months ago.  Management since that visit includes continuing same medications.  He reports good compliance with treatment. He is not having side effects.   Symptoms: No chest pain No chest pressure/discomfort  No dyspnea No lower extremity edema  No numbness or tingling of extremity No orthopnea  No palpitations No paroxysmal nocturnal dyspnea    No speech difficulty No syncope   Current diet: in general, a "healthy" diet   Current exercise: none  The ASCVD Risk score ([place]., et al., 2013) failed to calculate for the following reasons:   The patient has a prior MI or stroke diagnosis  ---------------------------------------------------------------------------------------------------  Hyperglycemia, Follow-up  Lab Results  Component Value Date   HGBA1C 6.0 (H) 11/27/2018   HGBA1C 5.8 (H) 03/14/2018   HGBA1C 5.9 (H) 12/03/2017   GLUCOSE 105 (H) 11/27/2018   GLUCOSE 89 08/21/2018   GLUCOSE 104 (H) 03/13/2018    Last seen for for this 9 months ago.  Management since that visit includes continuing healthy diet and lifestyle modification. Current symptoms include none   Prior visit with dietician: yes -  Current diet: in general, a "healthy" diet   Current exercise: none  Pertinent Labs:    Component Value Date/Time   CHOL 129 11/27/2018 0911   TRIG 59 11/27/2018 0911   CHOLHDL 3.3 11/27/2018 0911   CHOLHDL 4.5 03/14/2018 0711   CREATININE 1.05 11/27/2018 0911   CREATININE 1.10 01/09/2017 0906    Wt Readings from Last 3 Encounters:  08/25/19 183 lb 12.8 oz (83.4 kg)  08/25/19 183 lb 12.8 oz (83.4 kg)  03/16/19 183 lb 12 oz (83.3 kg)    -----------------------------------------------------------------------------------------  Hypertension, follow-up  BP Readings from Last 3 Encounters:  08/25/19 (!) 152/68  08/25/19 Marland Kitchen)  130/66  03/16/19 140/70   Wt Readings from Last 3 Encounters:  08/25/19 183 lb 12.8 oz (83.4 kg)  08/25/19 183 lb 12.8 oz (83.4 kg)  03/16/19 183 lb 12 oz (83.3 kg)     He was last seen for hypertension 1 year ago.  BP at that visit was 134/61. Management since that visit includes continue same medication.  He reports good compliance with treatment. He is not having side effects.  He is following a Regular diet. He is not exercising. He does not smoke.  Use of agents  associated with hypertension: none.   Outside blood pressures are being checked at home. Symptoms: No chest pain No chest pressure  No palpitations No syncope  No dyspnea No orthopnea  No paroxysmal nocturnal dyspnea No lower extremity edema   Pertinent labs: Lab Results  Component Value Date   CHOL 129 11/27/2018   HDL 39 (L) 11/27/2018   LDLCALC 77 11/27/2018   TRIG 59 11/27/2018   CHOLHDL 3.3 11/27/2018   Lab Results  Component Value Date   NA 139 11/27/2018   K 4.3 11/27/2018   CREATININE 1.05 11/27/2018   GFRNONAA 69 11/27/2018   GFRAA 79 11/27/2018   GLUCOSE 105 (H) 11/27/2018     The ASCVD Risk score ([place] DC Jr., et al., 2013) failed to calculate for the following reasons:   The patient has a prior MI or stroke diagnosis   ---------------------------------------------------------------------------------------------------  Past Medical History:  Diagnosis Date  . CVA (cerebral vascular accident) ([place]) 03/20/2018  . History of chicken pox   . History of measles   . Hyperlipidemia   . Hypertension    Past Surgical History:  Procedure Laterality Date  . CARDIAC CATHETERIZATION  11/2017   No stents/Fath   Social History   Socioeconomic History  . Marital status: Married    Spouse name: [name]  . Number of children: 2  . Years of education: Not on file  . Highest education level: 12th grade  Occupational History  . Occupation: Retired  Tobacco Use  . Smoking status: Former Smoker    Packs/day: 1.00    Years: 20.00    Pack years: 20.00    Quit date: 03/12/1983    Years since quitting: 36.4  . Smokeless tobacco: Never Used  Vaping Use  . Vaping Use: Never used  Substance and Sexual Activity  . Alcohol use: No  . Drug use: No  . Sexual activity: Not on file  Other Topics Concern  . Not on file  Social History Narrative  . Not on file   Social Determinants of Health   Financial Resource Strain: Low Risk   . Difficulty of Paying Living Expenses:  Not hard at all  Food Insecurity: No Food Insecurity  . Worried About Charity fundraiser in the Last Year: Never true  . Ran Out of Food in the Last Year: Never true  Transportation Needs: No Transportation Needs  . Lack of Transportation (Medical): No  . Lack of Transportation (Non-Medical): No  Physical Activity: Inactive  . Days of Exercise per Week: 0 days  . Minutes of Exercise per Session: 0 min  Stress: No Stress Concern Present  . Feeling of Stress : Not at all  Social Connections: Moderately Integrated  . Frequency of Communication with Friends and Family: Three times a week  . Frequency of Social Gatherings with Friends and Family: More than three times a week  . Attends Religious Services: More than 4 times  per year  . Active Member of Clubs or Organizations: No  . Attends Archivist Meetings: Never  . Marital Status: Married  Human resources officer Violence: Not At Risk  . Fear of Current or Ex-Partner: No  . Emotionally Abused: No  . Physically Abused: No  . Sexually Abused: No   Family Status  Relation Name Status  . Mother  Alive  . Father  Deceased at age 19       lung cancer  . Sister  Alive  . Brother  Alive  . Son  Alive  . Daughter  Alive  . Neg Hx  (Not Specified)   Family History  Problem Relation Age of Onset  . Arrhythmia Mother        A-Fib  . Diabetes Son   . Colon cancer Neg Hx   . Prostate cancer Neg Hx    No Known Allergies  Patient Care Team: [name], MD as PCP - General (Family Medicine) [name], OD as Consulting Physician (Optometry) Rockey Situ, Kathlene November, MD as Consulting Physician (Cardiology)   Medications: Outpatient Medications Prior to Visit  Medication Sig  . amLODipine (NORVASC) 5 MG tablet Take 1 tablet (5 mg total) by mouth daily.  Marland Kitchen apixaban (ELIQUIS) 5 MG TABS tablet Take 1 tablet (5 mg total) by mouth 2 (two) times daily.  Marland Kitchen atorvastatin (LIPITOR) 40 MG tablet TAKE ONE TABLET EVERY DAY  .  diclofenac sodium (VOLTAREN) 1 % GEL Apply 2 g topically 4 (four) times daily.  . meloxicam (MOBIC) 15 MG tablet take 1 tablet by mouth once daily after meals if needed for BACK PAIN (Patient taking differently: Take 15 mg by mouth daily as needed (back pain). )  . metoprolol succinate (TOPROL-XL) 25 MG 24 hr tablet Take 1 tablet (25 mg total) by mouth daily. Take with or immediately following a meal.   No facility-administered medications prior to visit.    Review of Systems  Constitutional: Positive for fatigue.  HENT: Negative.   Eyes: Negative.   Respiratory: Negative.   Cardiovascular: Negative.   Gastrointestinal: Negative.   Endocrine: Negative.   Genitourinary: Negative.   Musculoskeletal: Negative.   Skin: Negative.   Allergic/Immunologic: Negative.   Neurological: Negative.   Hematological: Negative.   Psychiatric/Behavioral: Negative.       Objective    BP (!) 152/68 (BP Location: Right Arm, Patient Position: Sitting, Cuff Size: Normal)   Pulse 64   Temp (!) 97.3 F (36.3 C) (Temporal)   Ht 5\' 4"  (1.626 m)   Wt 183 lb 12.8 oz (83.4 kg)   SpO2 95%   BMI 31.55 kg/m    Physical Exam   General Appearance:    Obese male. Alert, cooperative, in no acute distress, appears stated age  Head:    Normocephalic, without obvious abnormality, atraumatic  Eyes:    PERRL, conjunctiva/corneas clear, EOM's intact, fundi    benign, both eyes       Ears:    Normal TM's and external ear canals, both ears  Neck:   Supple, symmetrical, trachea midline, no adenopathy;       thyroid:  No enlargement/tenderness/nodules; no carotid   bruit or JVD  Back:     Symmetric, no curvature, ROM normal, no CVA tenderness  Lungs:     Clear to auscultation bilaterally, respirations unlabored  Chest wall:    No tenderness or deformity  Heart:    Normal heart rate. Normal rhythm.  3/6 harsh, crescendo-decrescendo, systolic murmur  at right upper sternal border S1 and S2 normal  Abdomen:      Soft, non-tender, bowel sounds active all four quadrants,    no masses, no organomegaly  Genitalia:    deferred  Rectal:    deferred  Extremities:   All extremities are intact. No cyanosis or edema  Pulses:   2+ and symmetric all extremities  Skin:   Skin color, texture, turgor normal, no rashes or lesions  Lymph nodes:   Cervical, supraclavicular, and axillary nodes normal  Neurologic:   CNII-XII intact. Normal strength, sensation and reflexes      throughout     Last depression screening scores PHQ 2/9 Scores 08/25/2019 08/21/2018 04/29/2017  PHQ - 2 Score 0 0 0  PHQ- 9 Score - 0 0   Last fall risk screening Fall Risk  08/25/2019  Falls in the past year? 0  Number falls in past yr: 0  Injury with Fall? 0  Follow up -   Last Audit-C alcohol use screening Alcohol Use Disorder Test (AUDIT) 08/25/2019  1. How often do you have a drink containing alcohol? 0  2. How many drinks containing alcohol do you have on a typical day when you are drinking? 0  3. How often do you have six or more drinks on one occasion? 0  AUDIT-C Score 0  Alcohol Brief Interventions/Follow-up AUDIT Score <7 follow-up not indicated   A score of 3 or more in women, and 4 or more in men indicates increased risk for alcohol abuse, EXCEPT if all of the points are from question 1   No results found for any visits on 08/25/19.  Assessment & Plan    Routine Health Maintenance and Physical Exam  Exercise Activities and Dietary recommendations Goals    . Exercise 3x per week (30 min per time)     Recommend to walk 3 days a week for 30 minutes at a time without stopping.        Immunization History  Administered Date(s) Administered  . Fluad Quad(high Dose 65+) 11/27/2018  . Influenza, High Dose Seasonal PF 01/17/2014, 01/25/2016, 01/09/2017, 12/05/2017  . Influenza,inj,Quad PF,6+ Mos 01/24/2015  . PFIZER SARS-COV-2 Vaccination 02/17/2019, 03/10/2019  . Pneumococcal Conjugate-13 07/12/2013  . Pneumococcal  Polysaccharide-23 01/07/2008  . Td 12/15/2003  . Tdap 08/15/2015    Health Maintenance  Topic Date Due  . INFLUENZA VACCINE  08/29/2019  . TETANUS/TDAP  08/14/2025  . COVID-19 Vaccine  Completed  . PNA vac Low Risk Adult  Completed    Discussed health benefits of physical activity, and encouraged him to engage in regular exercise appropriate for his age and condition.  1. Annual physical exam   2. Prostate cancer screening  - PSA Total (Reflex To Free) ([facility] only)  3. Need for shingles vaccine Given printed information regarding shingles vaccine.   4. Essential (primary) hypertension SBP mildly elevated today. Continue current medication while awaiting echocardiogram report that is coming up.   5. Hyperglycemia  - Hemoglobin A1c  6. Pure hypercholesterolemia He is tolerating atorvastatin well with no adverse effects.   - CBC - Comprehensive metabolic panel - Lipid panel - TSH  7. Other fatigue See how labs look.   8. Dyspnea, unspecified type  Is scheduled for echo in August.   No follow-ups on file.     The entirety of the information documented in the History of Present Illness, Review of Systems and Physical Exam were personally obtained by me. Portions of this information were  initially documented by the CMA and reviewed by me for thoroughness and accuracy.      [name], MD  [facility] [phone] (phone) [phone] (fax)  [place]

## 2019-08-26 LAB — HEMOGLOBIN A1C
Est. average glucose Bld gHb Est-mCnc: 123 mg/dL
Hgb A1c MFr Bld: 5.9 % — ABNORMAL HIGH (ref 4.8–5.6)

## 2019-08-26 LAB — COMPREHENSIVE METABOLIC PANEL
ALT: 15 IU/L (ref 0–44)
AST: 22 IU/L (ref 0–40)
Albumin/Globulin Ratio: 1.7 (ref 1.2–2.2)
Albumin: 4.3 g/dL (ref 3.7–4.7)
Alkaline Phosphatase: 101 IU/L (ref 48–121)
BUN/Creatinine Ratio: 10 (ref 10–24)
BUN: 10 mg/dL (ref 8–27)
Bilirubin Total: 0.9 mg/dL (ref 0.0–1.2)
CO2: 20 mmol/L (ref 20–29)
Calcium: 9.4 mg/dL (ref 8.6–10.2)
Chloride: 106 mmol/L (ref 96–106)
Creatinine, Ser: 1.03 mg/dL (ref 0.76–1.27)
GFR calc Af Amer: 81 mL/min/{1.73_m2} (ref >59)
GFR calc non Af Amer: 70 mL/min/{1.73_m2} (ref >59)
Globulin, Total: 2.5 g/dL (ref 1.5–4.5)
Glucose: 103 mg/dL — ABNORMAL HIGH (ref 65–99)
Potassium: 4.3 mmol/L (ref 3.5–5.2)
Sodium: 141 mmol/L (ref 134–144)
Total Protein: 6.8 g/dL (ref 6.0–8.5)

## 2019-08-26 LAB — LIPID PANEL
Chol/HDL Ratio: 4.4 ratio (ref 0.0–5.0)
Cholesterol, Total: 162 mg/dL (ref 100–199)
HDL: 37 mg/dL — ABNORMAL LOW (ref >39)
LDL Chol Calc (NIH): 104 mg/dL — ABNORMAL HIGH (ref 0–99)
Triglycerides: 117 mg/dL (ref 0–149)
VLDL Cholesterol Cal: 21 mg/dL (ref 5–40)

## 2019-08-26 LAB — PSA TOTAL (REFLEX TO FREE): Prostate Specific Ag, Serum: 2.3 ng/mL (ref 0.0–4.0)

## 2019-08-26 LAB — CBC
Hematocrit: 40.8 % (ref 37.5–51.0)
Hemoglobin: 13.8 g/dL (ref 13.0–17.7)
MCH: 29.7 pg (ref 26.6–33.0)
MCHC: 33.8 g/dL (ref 31.5–35.7)
MCV: 88 fL (ref 79–97)
Platelets: 260 10*3/uL (ref 150–450)
RBC: 4.64 x10E6/uL (ref 4.14–5.80)
RDW: 13.3 % (ref 11.6–15.4)
WBC: 6.2 10*3/uL (ref 3.4–10.8)

## 2019-08-26 LAB — TSH: TSH: 1.89 u[IU]/mL (ref 0.450–4.500)

## 2019-09-13 ENCOUNTER — Ambulatory Visit (INDEPENDENT_AMBULATORY_CARE_PROVIDER_SITE_OTHER): Payer: PPO

## 2019-09-13 ENCOUNTER — Other Ambulatory Visit: Payer: Self-pay

## 2019-09-13 DIAGNOSIS — I350 Nonrheumatic aortic (valve) stenosis: Secondary | ICD-10-CM | POA: Diagnosis not present

## 2019-09-13 LAB — ECHOCARDIOGRAM COMPLETE
AR max vel: 1.37 cm2
AV Area VTI: 1.52 cm2
AV Area mean vel: 1.37 cm2
AV Mean grad: 12.5 mmHg
AV Peak grad: 21.2 mmHg
Ao pk vel: 2.3 m/s
Area-P 1/2: 3.21 cm2
S' Lateral: 3.1 cm

## 2019-11-17 ENCOUNTER — Other Ambulatory Visit: Payer: Self-pay

## 2019-11-17 ENCOUNTER — Ambulatory Visit (INDEPENDENT_AMBULATORY_CARE_PROVIDER_SITE_OTHER): Payer: PPO

## 2019-11-17 DIAGNOSIS — Z23 Encounter for immunization: Secondary | ICD-10-CM

## 2019-12-29 ENCOUNTER — Other Ambulatory Visit: Payer: Self-pay

## 2019-12-29 ENCOUNTER — Ambulatory Visit (INDEPENDENT_AMBULATORY_CARE_PROVIDER_SITE_OTHER): Payer: PPO

## 2019-12-29 DIAGNOSIS — Z23 Encounter for immunization: Secondary | ICD-10-CM

## 2020-01-03 ENCOUNTER — Other Ambulatory Visit: Payer: Self-pay | Admitting: Family Medicine

## 2020-02-02 ENCOUNTER — Other Ambulatory Visit: Payer: Self-pay

## 2020-02-02 MED ORDER — METOPROLOL SUCCINATE ER 25 MG PO TB24: 25.0000 mg | ORAL_TABLET | Freq: Every day | ORAL | 3 refills | Status: DC

## 2020-03-15 ENCOUNTER — Encounter: Payer: Self-pay | Admitting: Cardiovascular Disease

## 2020-03-15 ENCOUNTER — Other Ambulatory Visit: Payer: Self-pay

## 2020-03-15 ENCOUNTER — Ambulatory Visit: Payer: PPO | Admitting: Cardiovascular Disease

## 2020-03-15 VITALS — BP 134/68 | HR 69 | Ht 65.0 in | Wt 189.0 lb

## 2020-03-15 DIAGNOSIS — I10 Essential (primary) hypertension: Secondary | ICD-10-CM

## 2020-03-15 DIAGNOSIS — I639 Cerebral infarction, unspecified: Secondary | ICD-10-CM | POA: Diagnosis not present

## 2020-03-15 DIAGNOSIS — I350 Nonrheumatic aortic (valve) stenosis: Secondary | ICD-10-CM

## 2020-03-15 DIAGNOSIS — I480 Paroxysmal atrial fibrillation: Secondary | ICD-10-CM

## 2020-03-15 DIAGNOSIS — E7800 Pure hypercholesterolemia, unspecified: Secondary | ICD-10-CM | POA: Diagnosis not present

## 2020-03-15 NOTE — Progress Notes (Signed)
Cardiology Office Note  Date:  03/15/2020   ID:  Jon Mejia, DOB: July 09, 1940, MRN: [id number]  PCP:  [name], MD   Chief Complaint  Patient presents with  . Other    12 month follow up. Meds reviewed verbally with patient.     HPI:  Jon Mejia is a 80 y.o. male who has a PMHx of: Stroke Mild  aortic valve stenosis Pure hypercholesterolemia HTN Premature atrial beats quit smoking about 35 years ago. His smoking use included cigarettes. He has a 20.00 pack-year smoking history. 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 Aortic valve area, by VTI measures  1.52 cm. Aortic valve mean gradient measures 12.5 mmHg.   No bleeding on Eliquis, CBC stable No regular exercise program No chest pain or shortness of breath on exertion  Lab work reviewed HGBA1C 5.9 Total chol 169 CR 1.03  Past medical history reviewed 02/2018  scheduled for TEE and placement of loop monitor This was cancelled after noting he had run of atrial fib this AM on telemetry Started on eliquis 5 BID  EKG personally reviewed by myself on todays visit Shows normal sinus rhythm rate 69 bpm no significant ST-T wave changes  Other past medical history reviewed ED visit on 03/13/2018 for sudden onset decreased vision while working outside around 2pm. He initially had complete vision loss, but eventually was just seeing shadows. Never happened before. He denied numbness, tingling, or focal weakness. He denies headache. His wife states he has been tired recently and last night was feeling dizzy. Code stroke called on presentation and patient taken given  alteplase.    He was diagnosed with acute stroke symptoms and was admitted to ICU per neurology for monitoring. The patient was discharged 03/15/2018.   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 Punctate acute infarct in the posterior right frontal lobe. 2. Small chronic cerebellar infarct.  CT neck on 03/13/2018 showed: 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 On the right, there is soft and calcified plaque. Maximal stenosis in the right ICA bulb is 30%. No measurable stenosis on the left.   PMH:   has a past medical history of CVA (cerebral vascular accident) ([place]) (03/20/2018), History of chicken pox, History of measles, Hyperlipidemia, and Hypertension.  PSH:    Past Surgical History:  Procedure Laterality Date  . CARDIAC CATHETERIZATION  11/2017   No stents/Fath    Current Outpatient Medications  Medication Sig Dispense Refill  . amLODipine (NORVASC) 5 MG tablet Take 1 tablet (5 mg total) by mouth daily. 90 tablet 3  . apixaban (ELIQUIS) 5 MG TABS tablet Take 1 tablet (5 mg total) by mouth 2 (two) times daily. 180 tablet 4  . atorvastatin (LIPITOR) 40 MG tablet TAKE ONE TABLET EVERY DAY 90 tablet 2  . diclofenac sodium (VOLTAREN) 1 % GEL Apply 2 g topically 4 (four) times daily. 100 g 2  . metoprolol succinate (TOPROL-XL) 25 MG 24 hr tablet Take 1 tablet (25 mg total) by mouth daily.  Take with or immediately following a meal. 90 tablet 3   No current facility-administered medications for this visit.     ALLERGIES:   Patient has no known allergies.   SOCIAL HISTORY:  The patient  reports that he quit smoking about 37 years ago. He has a 20.00 pack-year smoking history. He has never used smokeless tobacco. He reports that he does not drink alcohol and does not use drugs.   FAMILY HISTORY:   family history includes Arrhythmia in his mother; Diabetes in  his son.    REVIEW OF SYSTEMS: Review of Systems  Constitutional: Negative.   HENT: Negative.   Eyes: Negative.   Respiratory: Negative.  Negative for shortness of breath.   Cardiovascular: Negative.  Negative for chest pain.  Gastrointestinal: Negative.   Genitourinary: Negative.   Musculoskeletal: Negative.   Neurological: Negative.   Psychiatric/Behavioral: Negative.   All other systems reviewed and are negative.   PHYSICAL EXAM: VS:  BP 134/68 (BP Location: Left Arm, Patient Position: Sitting, Cuff Size: Normal)   Pulse 69   Ht 5\' 5"  (1.651 m)   Wt 189 lb (85.7 kg)   BMI 31.45 kg/m  , BMI Body mass index is 31.45 kg/m. Constitutional:  oriented to person, place, and time. No distress.  HENT:  Head: Grossly normal Eyes:  no discharge. No scleral icterus.  Neck: No JVD, no carotid bruits  Cardiovascular: Regular rate and rhythm, 2/6 systolic ejection murmur right sternal border Pulmonary/Chest: Clear to auscultation bilaterally, no wheezes or rails Abdominal: Soft.  no distension.  no tenderness.  Musculoskeletal: Normal range of motion Neurological:  normal muscle tone. Coordination normal. No atrophy Skin: Skin warm and dry Psychiatric: normal affect, pleasant   RECENT LABS: 08/25/2019: ALT 15; BUN 10; Creatinine, Ser 1.03; Hemoglobin 13.8; Platelets 260; Potassium 4.3; Sodium 141; TSH 1.890    LIPID PANEL: Lab Results  Component Value Date   CHOL 162 08/25/2019   HDL 37 (L) 08/25/2019   LDLCALC 104 (H) 08/25/2019   TRIG 117 08/25/2019      WEIGHT: Wt Readings from Last 3 Encounters:  03/15/20 189 lb (85.7 kg)  08/25/19 183 lb 12.8 oz (83.4 kg)  08/25/19 183 lb 12.8 oz (83.4 kg)     ASSESSMENT AND PLAN:   History of stroke paroxysmal atrial fibrillation, previously documented February 2020 Denies any TIA or stroke symptoms, tolerating anticoagulation, no significant symptoms of tachypalpitations, we will continue metoprolol at current  dose  Paroxysmal atrial fibrillation Plan as above, continue metoprolol with Eliquis  Essential hypertension Blood pressure is well controlled on today's visit. No changes made to the medications.  Hyperlipidemia  Cholesterol is at goal on the current lipid regimen. No changes to the medications were made.  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 03/15/2020  [place], [place]

## 2020-03-15 NOTE — Patient Instructions (Signed)
Medication Instructions:  No changes  If you need a refill on your cardiac medications before your next appointment, please call your pharmacy.    Lab work: No new labs needed   If you have labs (blood work) drawn today and your tests are completely normal, you will receive your results only by: . MyChart Message (if you have MyChart) OR . A paper copy in the mail If you have any lab test that is abnormal or we need to change your treatment, we will call you to review the results.   Testing/Procedures: No new testing needed   Follow-Up: At [facility], you and your health needs are our priority.  As part of our continuing mission to provide you with exceptional heart care, we have created designated Provider Care Teams.  These Care Teams include your primary Cardiologist (physician) and Advanced Practice Providers (APPs -  Physician Assistants and Nurse Practitioners) who all work together to provide you with the care you need, when you need it.  . You will need a follow up appointment in 12 months  . Providers on your designated Care Team:   . Christopher Berge, NP . Ryan Dunn, PA-C . Jacquelyn Visser, PA-C  Any Other Special Instructions Will Be Listed Below (If Applicable).  COVID-19 Vaccine Information can be found at: [email or web address] For questions related to vaccine distribution or appointments, please email [email or web address] or call [phone].     

## 2020-03-20 ENCOUNTER — Other Ambulatory Visit: Payer: Self-pay | Admitting: Cardiovascular Disease

## 2020-03-20 NOTE — Telephone Encounter (Signed)
Refill request

## 2020-05-31 ENCOUNTER — Other Ambulatory Visit: Payer: Self-pay | Admitting: Cardiovascular Disease

## 2020-05-31 NOTE — Telephone Encounter (Signed)
Rx request sent to pharmacy.  

## 2020-07-13 ENCOUNTER — Other Ambulatory Visit: Payer: Self-pay | Admitting: Family Medicine

## 2020-07-25 IMAGING — CT CT ANGIO HEAD
2 of 7 series · 8 of 33 positions shown · IV contrast (iopamidol)
Comparison: Head CT earlier same day

CLINICAL DATA: Acute onset of right-sided blindness.

EXAM:
CT ANGIOGRAPHY HEAD AND NECK
TECHNIQUE: Multidetector CT imaging of the head and neck was performed using
the standard protocol during bolus administration of intravenous
contrast. Multiplanar CT image reconstructions and MIPs were
obtained to evaluate the vascular anatomy. Carotid stenosis
measurements (when applicable) are obtained utilizing NASCET
criteria, using the distal internal carotid diameter as the
denominator.
CONTRAST:  100mL XMC1A8-NDB IOPAMIDOL (XMC1A8-NDB) INJECTION 76%

[Series 5: cta neck · axial · 0.43mm/px · z∈[-172,-62]mm · 2 of 167 slices shown]
[im 56/167  soft-tissue]
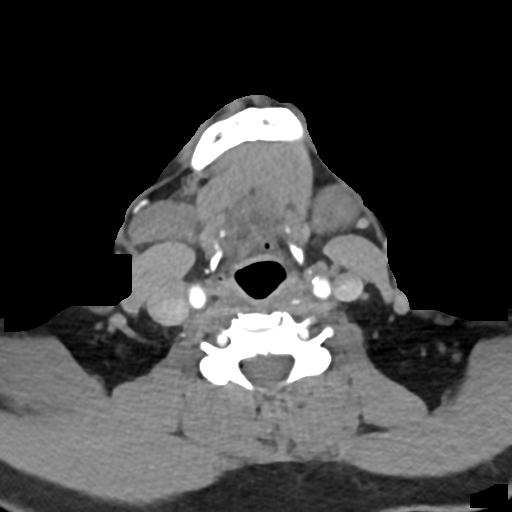
[im 111/167  soft-tissue]
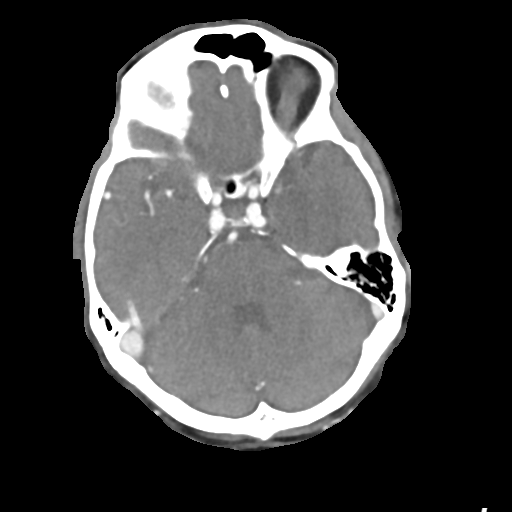

[Series 7: cta neck axial · axial · 0.40mm/px · z∈[-234,+1]mm · 6 of 330 slices shown]
[im 48/330  soft-tissue]
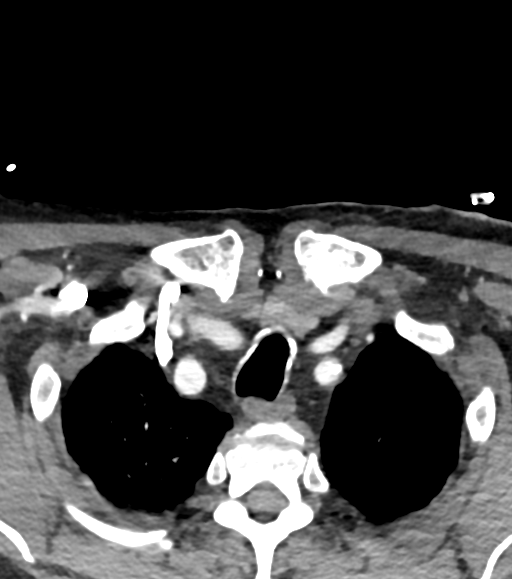
[im 95/330  bone]
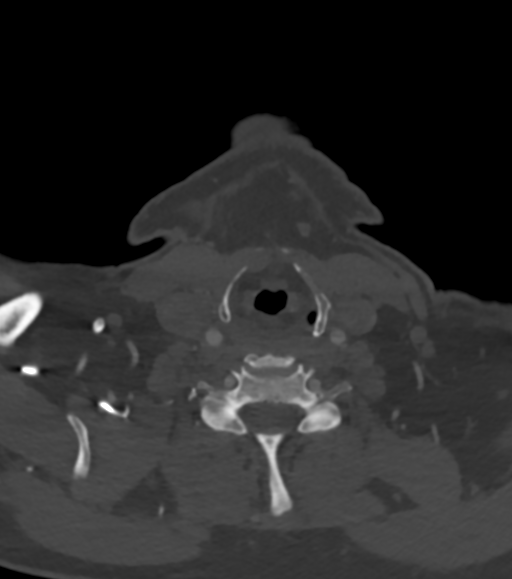
[im 142/330  soft-tissue]
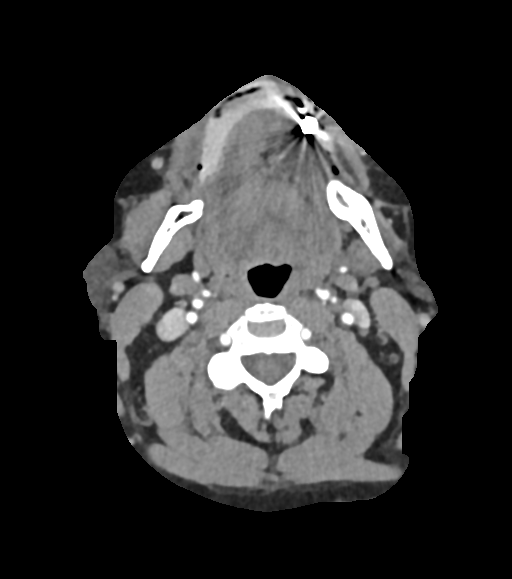
[im 189/330  bone]
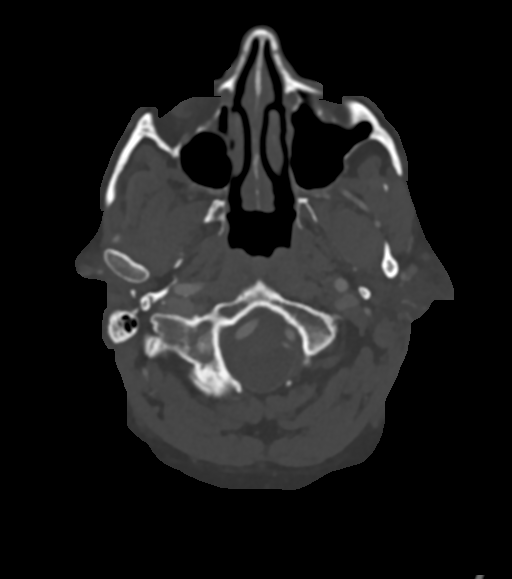
[im 236/330  soft-tissue]
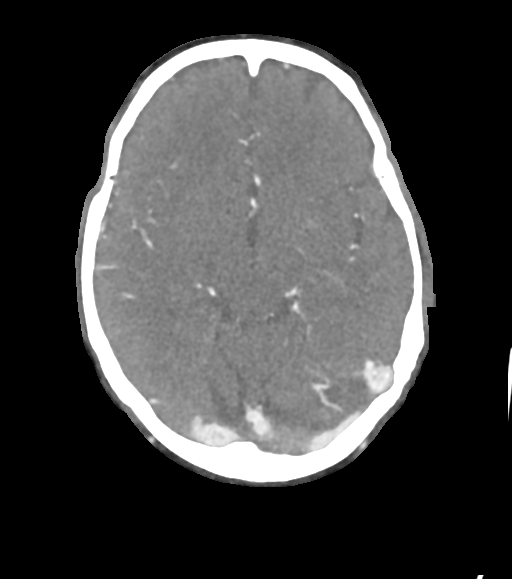
[im 283/330  bone]
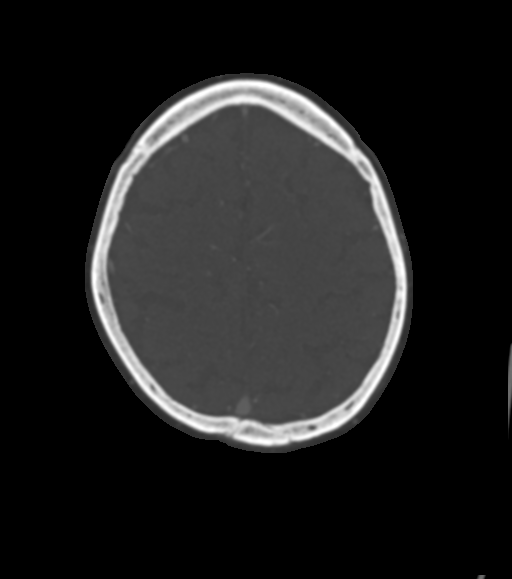

[8 of 33 positions shown; findings below may reference images not displayed]

FINDINGS: CTA NECK FINDINGS

Aortic arch: Aortic atherosclerosis. No aneurysm or dissection.
Branching pattern is normal. No flow limiting origin stenosis.

Right carotid system: Common carotid artery shows mild plaque but is
widely patent to the bifurcation region. There is soft and calcified
plaque at the carotid bifurcation and ICA bulb. Minimal diameter in
the distal bulb measures 3 mm. Compared to a more distal cervical
ICA diameter of 4.3 mm, this indicates a 30% stenosis.

Left carotid system: Common carotid artery is widely patent to the
bifurcation. There is calcified plaque at the carotid bifurcation
and ICA bulb. Minimal diameter in the distal bulb measures 4.5 mm.
Compared to a more distal cervical ICA diameter of the same, there
is no stenosis.

Vertebral arteries: Right vertebral artery origin is widely patent.
The right vertebral artery is widely patent through the cervical
region to the foramen magnum. Left vertebral artery origin shows
soft and calcified plaque with a 50% stenosis. Beyond the origin,
the vessel is widely patent through the cervical region to the
foramen magnum.

Skeleton: Ordinary cervical spondylosis.

Other neck: No mass or lymphadenopathy.

Upper chest: Mild upper lobe scarring.

Review of the MIP images confirms the above findings

CTA HEAD FINDINGS

Anterior circulation: Both internal carotid arteries are patent
through the skull base and siphon regions. There is atherosclerotic
calcification in the carotid siphon regions but no stenosis.
Ophthalmic arteries appeared show flow. Supraclinoid internal
carotid arteries are widely patent. The anterior and middle cerebral
vessels are patent without proximal stenosis. No large or medium
vessel occlusion is identified. There is mild fusiform dilatation at
the right MCA bifurcation but no definable aneurysm. More distal
branch vessels do show some atherosclerotic irregularity.

Posterior circulation: Both vertebral arteries are widely patent
through the foramen magnum to the basilar. Early origin of left
PICA. No basilar stenosis. Posterior circulation branch vessels are
patent. There is some areas of atherosclerotic narrowing in the PCA
branches but no flow limiting stenosis suspected.

Venous sinuses: Patent and normal.

Anatomic variants: None significant.

Delayed phase: No abnormal enhancement.

Review of the MIP images confirms the above findings
IMPRESSION: Atherosclerotic change at both carotid bifurcations. On the right,
there is soft and calcified plaque. Maximal stenosis in the right
ICA bulb is 30%. No measurable stenosis on the left.

50% stenosis of the left vertebral artery origin.

No intracranial large or medium vessel occlusion.

Flow is present in both ophthalmic arteries.

Mild fusiform dilatation at the right MCA bifurcation but no
measurable berry aneurysm.

Distal vessel atherosclerotic irregularity in the anterior, middle
and posterior cerebral artery branches.

## 2020-07-25 IMAGING — CT CT HEAD CODE STROKE
3 series · 15 of 47 positions shown, 18 images · non-contrast
Comparison: None.

CLINICAL DATA: Code stroke. Right-sided blindness. Last seen normal
9833 hours

EXAM:
CT HEAD WITHOUT CONTRAST
TECHNIQUE: Contiguous axial images were obtained from the base of the skull
through the vertex without intravenous contrast.

[Series 3: head 5.0 st · axial · 0.42mm/px · z∈[-90,+46]mm · 9 of 33 slices shown, 12 images]
[im 3/33  brain]
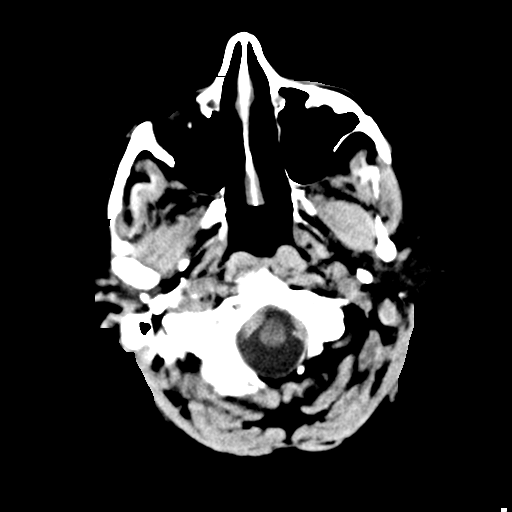
[im 3/33  bone]
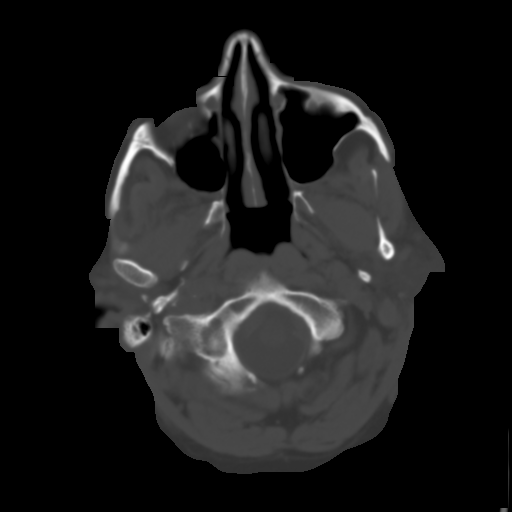
[im 6/33  brain]
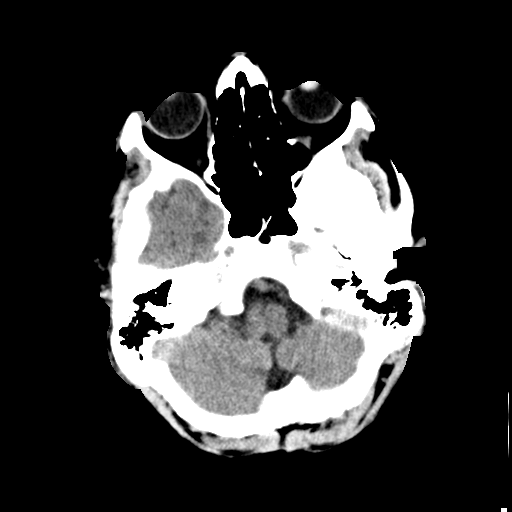
[im 9/33  brain]
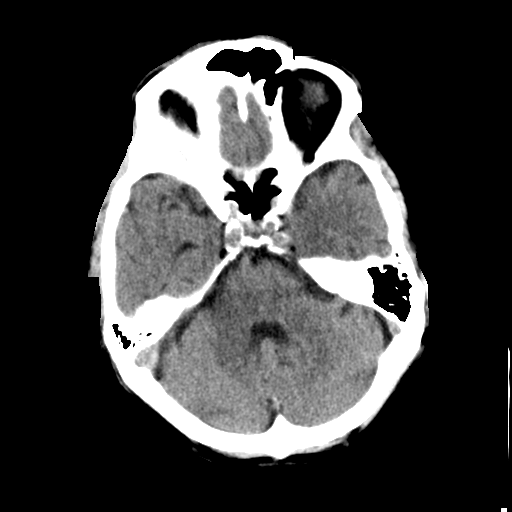
[im 13/33  brain]
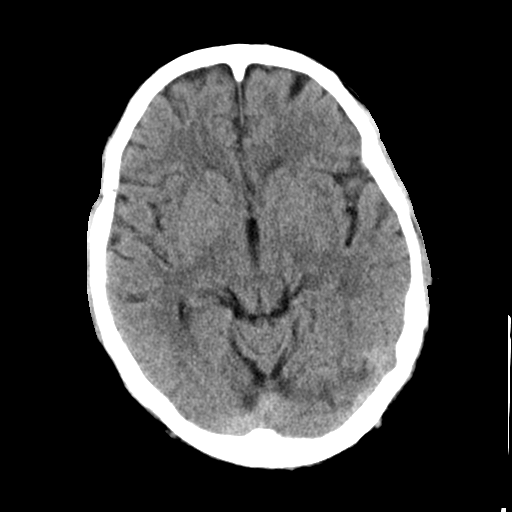
[im 17/33  brain]
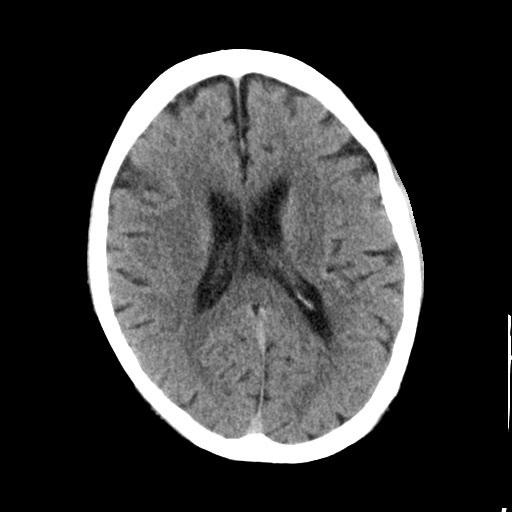
[im 17/33  bone]
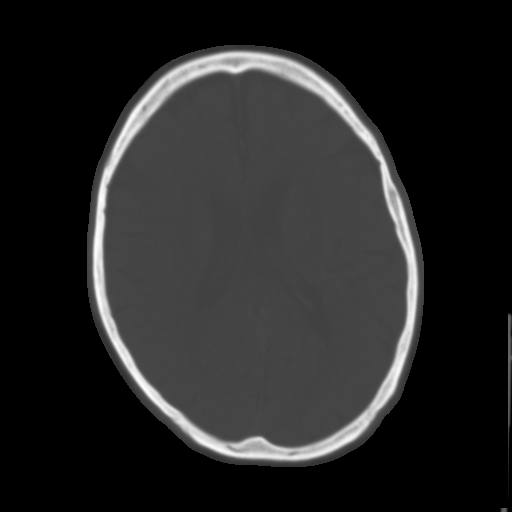
[im 20/33  brain]
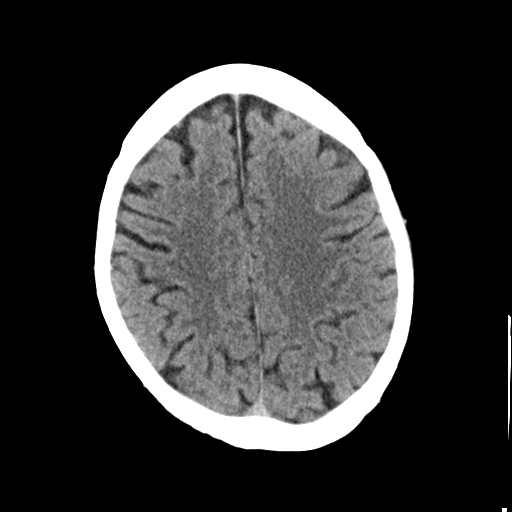
[im 24/33  brain]
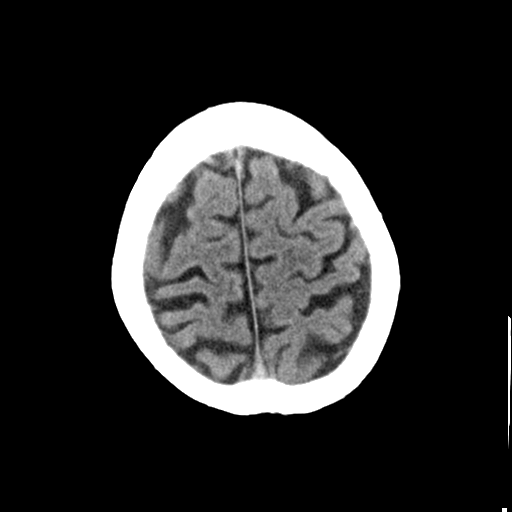
[im 27/33  brain]
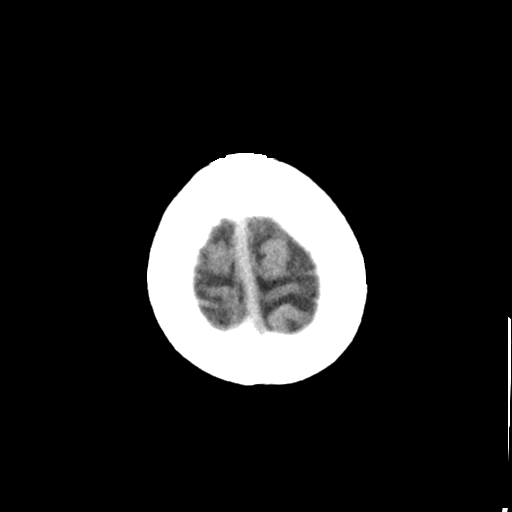
[im 30/33  brain]
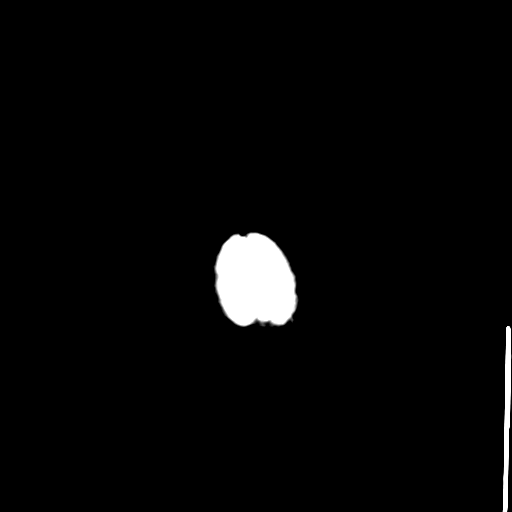
[im 30/33  bone]
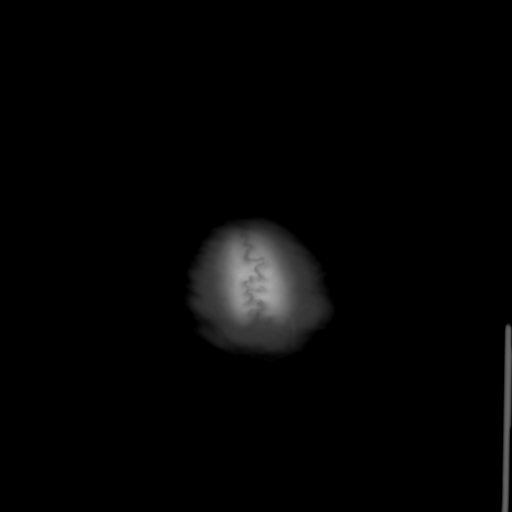

[Series 5: head 3.0 cor st · coronal · 0.35mm/px · 3 of 72 slices shown]
[im 24/72  brain]
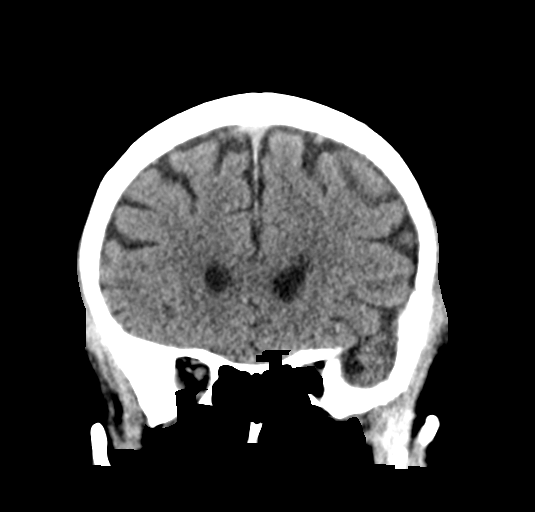
[im 32/72  brain]
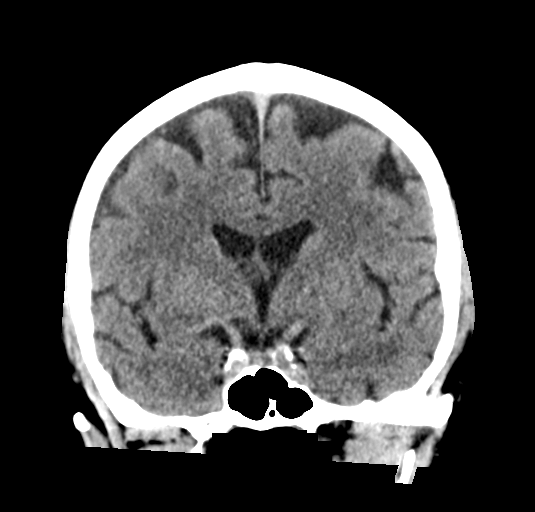
[im 40/72  brain]
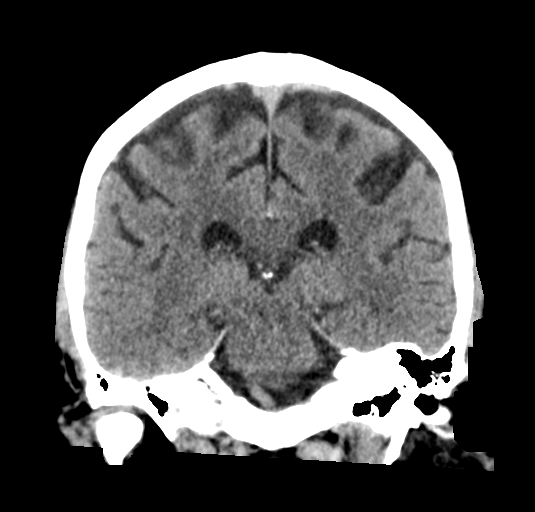

[Series 6: head 3.0 sag st · sagittal · 0.31mm/px · 3 of 64 slices shown]
[im 22/64  brain]
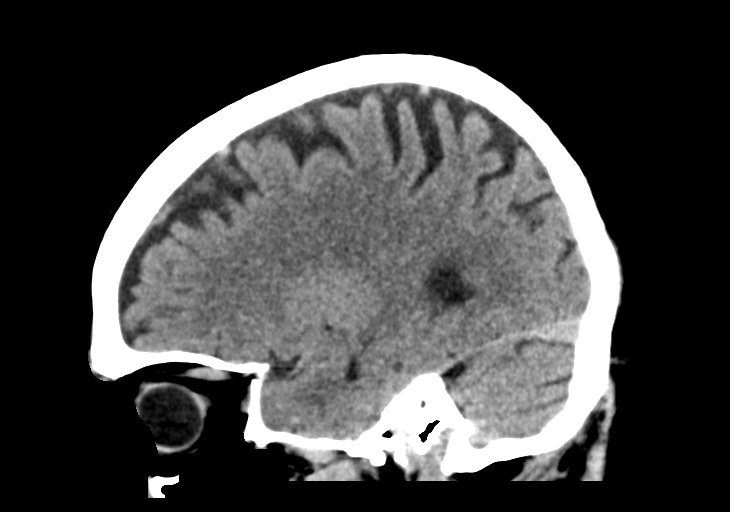
[im 32/64  brain]
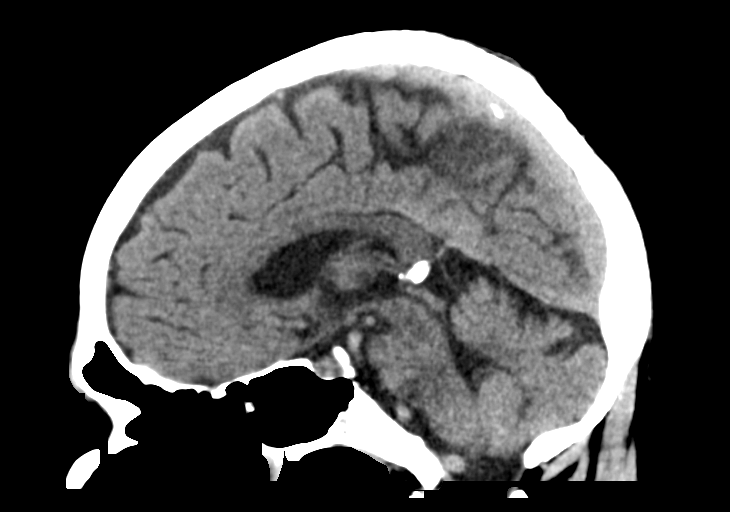
[im 43/64  brain]
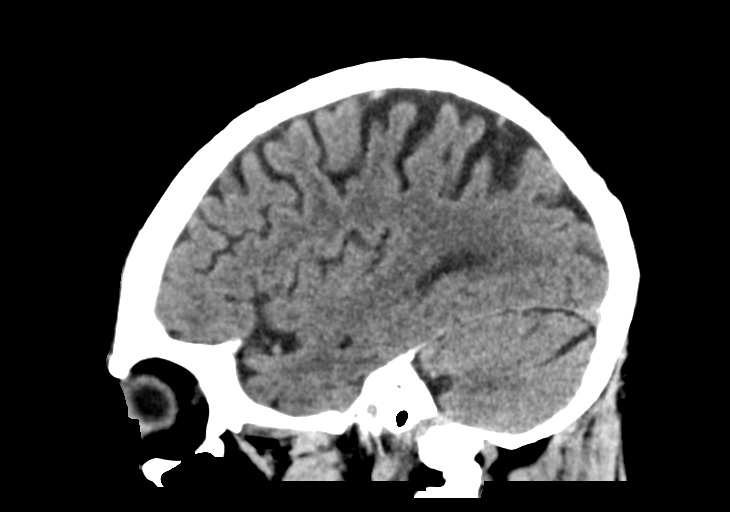

[15 of 47 positions shown; findings below may reference images not displayed]

FINDINGS: Brain: No evidence of accelerated atrophy. The brainstem and
cerebellum are normal. Minimal small vessel change of the cerebral
hemispheric white matter. Mild asymmetric physiologic calcification
in the left basal ganglia. No sign of acute infarction, mass lesion,
hemorrhage, hydrocephalus or extra-axial collection.

Vascular: There is atherosclerotic calcification of the major
vessels at the base of the brain.

Skull: Negative

Sinuses/Orbits: Clear/normal

Other: None

ASPECTS (Alberta Stroke Program Early CT Score)

- Ganglionic level infarction (caudate, lentiform nuclei, internal
capsule, insula, M1-M3 cortex): 7

- Supraganglionic infarction (M4-M6 cortex): 3

Total score (0-10 with 10 being normal): 10
IMPRESSION: 1. No acute finding.  Essentially normal head CT for age.
2. ASPECTS is 10.
3. These results were communicated to Dr. Devroe at [DATE] pmon
03/13/2018by text page via the [employer] messaging system.

## 2020-07-28 HISTORY — PX: SKIN CANCER EXCISION: SHX779

## 2020-08-01 DIAGNOSIS — B079 Viral wart, unspecified: Secondary | ICD-10-CM | POA: Diagnosis not present

## 2020-08-01 DIAGNOSIS — C44319 Basal cell carcinoma of skin of other parts of face: Secondary | ICD-10-CM | POA: Diagnosis not present

## 2020-08-01 DIAGNOSIS — Z1283 Encounter for screening for malignant neoplasm of skin: Secondary | ICD-10-CM | POA: Diagnosis not present

## 2020-08-01 DIAGNOSIS — D229 Melanocytic nevi, unspecified: Secondary | ICD-10-CM | POA: Diagnosis not present

## 2020-08-01 DIAGNOSIS — L820 Inflamed seborrheic keratosis: Secondary | ICD-10-CM | POA: Diagnosis not present

## 2020-08-01 DIAGNOSIS — L814 Other melanin hyperpigmentation: Secondary | ICD-10-CM | POA: Diagnosis not present

## 2020-08-01 DIAGNOSIS — D485 Neoplasm of uncertain behavior of skin: Secondary | ICD-10-CM | POA: Diagnosis not present

## 2020-08-01 DIAGNOSIS — L821 Other seborrheic keratosis: Secondary | ICD-10-CM | POA: Diagnosis not present

## 2020-08-31 DIAGNOSIS — Z85828 Personal history of other malignant neoplasm of skin: Secondary | ICD-10-CM | POA: Diagnosis not present

## 2020-08-31 DIAGNOSIS — L814 Other melanin hyperpigmentation: Secondary | ICD-10-CM | POA: Diagnosis not present

## 2020-08-31 DIAGNOSIS — C44319 Basal cell carcinoma of skin of other parts of face: Secondary | ICD-10-CM | POA: Diagnosis not present

## 2020-08-31 DIAGNOSIS — L578 Other skin changes due to chronic exposure to nonionizing radiation: Secondary | ICD-10-CM | POA: Diagnosis not present

## 2020-10-03 ENCOUNTER — Ambulatory Visit (INDEPENDENT_AMBULATORY_CARE_PROVIDER_SITE_OTHER): Payer: PPO | Admitting: Family Medicine

## 2020-10-03 ENCOUNTER — Other Ambulatory Visit: Payer: Self-pay

## 2020-10-03 ENCOUNTER — Encounter: Payer: Self-pay | Admitting: Family Medicine

## 2020-10-03 VITALS — BP 148/88 | HR 60 | Ht 65.0 in | Wt 188.0 lb

## 2020-10-03 DIAGNOSIS — Z23 Encounter for immunization: Secondary | ICD-10-CM | POA: Diagnosis not present

## 2020-10-03 DIAGNOSIS — I340 Nonrheumatic mitral (valve) insufficiency: Secondary | ICD-10-CM

## 2020-10-03 DIAGNOSIS — I10 Essential (primary) hypertension: Secondary | ICD-10-CM | POA: Diagnosis not present

## 2020-10-03 DIAGNOSIS — Z0000 Encounter for general adult medical examination without abnormal findings: Secondary | ICD-10-CM

## 2020-10-03 DIAGNOSIS — E7800 Pure hypercholesterolemia, unspecified: Secondary | ICD-10-CM

## 2020-10-03 DIAGNOSIS — Z289 Immunization not carried out for unspecified reason: Secondary | ICD-10-CM

## 2020-10-03 DIAGNOSIS — Z125 Encounter for screening for malignant neoplasm of prostate: Secondary | ICD-10-CM

## 2020-10-03 DIAGNOSIS — R739 Hyperglycemia, unspecified: Secondary | ICD-10-CM | POA: Diagnosis not present

## 2020-10-03 DIAGNOSIS — I350 Nonrheumatic aortic (valve) stenosis: Secondary | ICD-10-CM

## 2020-10-03 MED ORDER — SHINGRIX 50 MCG/0.5ML IM SUSR: 0.5000 mL | Freq: Once | INTRAMUSCULAR | 0 refills | Status: AC

## 2020-10-03 NOTE — Progress Notes (Signed)
Annual Wellness Visit     Patient: Jon Mejia, Male    DOB: [birth year], 80 y.o.   MRN: [id number] Visit Date: 10/03/2020  Today's Provider: [name], MD   Chief Complaint  Patient presents with   Annual Exam   Subjective    Jon Mejia is a 80 y.o. male who presents today for his Annual Wellness Visit.     Medications: Outpatient Medications Prior to Visit  Medication Sig   amLODipine (NORVASC) 5 MG tablet TAKE 1 TABLET BY MOUTH DAILY   atorvastatin (LIPITOR) 40 MG tablet TAKE ONE TABLET EVERY DAY   diclofenac sodium (VOLTAREN) 1 % GEL Apply 2 g topically 4 (four) times daily.   ELIQUIS 5 MG TABS tablet TAKE ONE (1) TABLET BY MOUTH TWO TIMES PER DAY   metoprolol succinate (TOPROL-XL) 25 MG 24 hr tablet Take 1 tablet (25 mg total) by mouth daily. Take with or immediately following a meal.   No facility-administered medications prior to visit.    No Known Allergies  Patient Care Team: [name], MD as PCP - General (Family Medicine) [name], Leroy as Consulting Physician (Optometry) Rockey Situ, Kathlene November, MD as Consulting Physician (Cardiology)       Objective     Most recent functional status assessment: No flowsheet data found. Most recent fall risk assessment: Fall Risk  08/25/2019  Falls in the past year? 0  Number falls in past yr: 0  Injury with Fall? 0  Follow up -    Most recent depression screenings: PHQ 2/9 Scores 08/25/2019 08/21/2018  PHQ - 2 Score 0 0  PHQ- 9 Score - 0   Most recent cognitive screening: No flowsheet data found. Most recent Audit-C alcohol use screening Alcohol Use Disorder Test (AUDIT) 08/25/2019  1. How often do you have a drink containing alcohol? 0  2. How many drinks containing alcohol do you have on a typical day when you are drinking? 0  3. How often do you have six or more drinks on one occasion? 0  AUDIT-C Score 0  Alcohol Brief Interventions/Follow-up AUDIT Score <7 follow-up not indicated    A score of 3 or more in women, and 4 or more in men indicates increased risk for alcohol abuse, EXCEPT if all of the points are from question 1   No results found for any visits on 10/03/20.  Assessment & Plan     Annual wellness visit done today including the all of the following: Reviewed patient's Family Medical History Reviewed and updated list of patient's medical providers Assessment of cognitive impairment was done Assessed patient's functional ability Established a written schedule for health screening [place] Completed and Reviewed  Exercise Activities and Dietary recommendations  Goals      Exercise 3x per week (30 min per time)     Recommend to walk 3 days a week for 30 minutes at a time without stopping.         Immunization History  Administered Date(s) Administered   Fluad Quad(high Dose 65+) 11/27/2018, 11/17/2019   Influenza, High Dose Seasonal PF 01/17/2014, 01/25/2016, 01/09/2017, 12/05/2017   Influenza,inj,Quad PF,6+ Mos 01/24/2015   PFIZER(Purple Top)SARS-COV-2 Vaccination 02/17/2019, 03/10/2019, 12/29/2019   Pneumococcal Conjugate-13 07/12/2013   Pneumococcal Polysaccharide-23 01/07/2008   Td 12/15/2003   Tdap 08/15/2015    Health Maintenance  Topic Date Due   Zoster Vaccines- Shingrix (1 of 2) Never done   COVID-19 Vaccine (4 - Booster for [employer]  series) 03/28/2020   INFLUENZA VACCINE  08/28/2020   TETANUS/TDAP  08/14/2025   PNA vac Low Risk Adult  Completed   HPV VACCINES  Aged Out     Discussed health benefits of physical activity, and encouraged him to engage in regular exercise appropriate for his age and condition.         The entirety of the information documented in the History of Present Illness, Review of Systems and Physical Exam were personally obtained by me. Portions of this information were initially documented by the CMA and reviewed by me for thoroughness and accuracy.     [name], MD   [facility] [phone] (phone) [phone] (fax)  [place]

## 2020-10-03 NOTE — Addendum Note (Signed)
Addended by: Ashley Royalty E on: 10/03/2020 09:55 AM   Modules accepted: Orders

## 2020-10-03 NOTE — Patient Instructions (Addendum)
Please review the attached list of medications and notify my office if there are any errors.   It is recommended to engage in 150 minutes of moderate exercise every week.   The CDC recommends two doses of Shingrix (the shingles vaccine) separated by 2 to 6 months for adults age 78 years and older. I recommend checking with your insurance plan regarding coverage for this vaccine.

## 2020-10-03 NOTE — Progress Notes (Signed)
Complete Physical Exam      Patient: Jon Mejia, Male    DOB: [birth year], 80 y.o.   MRN: [id number] Visit Date: 10/03/2020  Today's Provider: [name], MD   Chief Complaint  Patient presents with   Annual Exam    Subjective    Jon Mejia is a 80 y.o. male who presents today for his complete physical examination. He reports consuming a general diet.  Exercises regularly.  He generally feels well. He reports sleeping poorly. He does not have additional problems to discuss today.   HPI Hypertension, follow-up  BP Readings from Last 3 Encounters:  03/15/20 134/68  08/25/19 (!) 152/68  08/25/19 (!) 130/66   Wt Readings from Last 3 Encounters:  03/15/20 189 lb (85.7 kg)  08/25/19 183 lb 12.8 oz (83.4 kg)  08/25/19 183 lb 12.8 oz (83.4 kg)     He was last seen for hypertension 1  year  ago.  BP at that visit was 152/68. Management since that visit includes continue same medications.  He reports excellent compliance with treatment. He is not having side effects.  He is following a Regular diet. He is exercising. He does not smoke.  Use of agents associated with hypertension: none.   Outside blood pressures are 140's/80's or lower. Symptoms: No chest pain No chest pressure  No palpitations No syncope  No dyspnea No orthopnea  No paroxysmal nocturnal dyspnea No lower extremity edema   Pertinent labs: Lab Results  Component Value Date   CHOL 162 08/25/2019   HDL 37 (L) 08/25/2019   LDLCALC 104 (H) 08/25/2019   TRIG 117 08/25/2019   CHOLHDL 4.4 08/25/2019   Lab Results  Component Value Date   NA 141 08/25/2019   K 4.3 08/25/2019   CREATININE 1.03 08/25/2019   GFRNONAA 70 08/25/2019   GFRAA 81 08/25/2019   GLUCOSE 103 (H) 08/25/2019     The ASCVD Risk score (Goff DC Jr., et al., 2013) failed to calculate for the following reasons:   The patient has a prior MI or stroke diagnosis    ---------------------------------------------------------------------------------------------------   Lipid/Cholesterol, Follow-up  Last lipid panel Other pertinent labs  Lab Results  Component Value Date   CHOL 162 08/25/2019   HDL 37 (L) 08/25/2019   LDLCALC 104 (H) 08/25/2019   TRIG 117 08/25/2019   CHOLHDL 4.4 08/25/2019   Lab Results  Component Value Date   ALT 15 08/25/2019   AST 22 08/25/2019   PLT 260 08/25/2019   TSH 1.890 08/25/2019     He was last seen for this 1  year  ago.  Management since that visit includes continuing same medications.  He reports excellent compliance with treatment. He is not having side effects.     The ASCVD Risk score Mikey Bussing DC Jr., et al., 2013) failed to calculate for the following reasons:   The patient has a prior MI or stroke diagnosis  ---------------------------------------------------------------------------------------------------   Hyperglycemia, Follow-up  Lab Results  Component Value Date   HGBA1C 5.9 (H) 08/25/2019   HGBA1C 6.0 (H) 11/27/2018   HGBA1C 5.8 (H) 03/14/2018   GLUCOSE 103 (H) 08/25/2019   GLUCOSE 105 (H) 11/27/2018   GLUCOSE 89 08/21/2018    Last seen for for this1  year  ago.  Management since that visit includes continue lifestyle modifications. Current symptoms include none and have been unchanged.   -----------------------------------------------------------------------------------------      Medications: Outpatient Medications Prior to Visit  Medication Sig  amLODipine (NORVASC) 5 MG tablet TAKE 1 TABLET BY MOUTH DAILY   atorvastatin (LIPITOR) 40 MG tablet TAKE ONE TABLET EVERY DAY   diclofenac sodium (VOLTAREN) 1 % GEL Apply 2 g topically 4 (four) times daily.   ELIQUIS 5 MG TABS tablet TAKE ONE (1) TABLET BY MOUTH TWO TIMES PER DAY   metoprolol succinate (TOPROL-XL) 25 MG 24 hr tablet Take 1 tablet (25 mg total) by mouth daily. Take with or immediately following a meal.   No  facility-administered medications prior to visit.    No Known Allergies  Patient Care Team: [name], MD as PCP - General (Family Medicine) [name], [place] as Consulting Physician (Optometry) Rockey Situ, Kathlene November, MD as Consulting Physician (Cardiology)  Review of Systems  Constitutional:  Positive for chills. Negative for activity change, appetite change, diaphoresis, fatigue, fever and unexpected weight change.  HENT: Negative.    Eyes: Negative.   Respiratory:  Positive for shortness of breath. Negative for apnea, cough, choking, chest tightness, wheezing and stridor.   Cardiovascular: Negative.   Gastrointestinal: Negative.   Endocrine: Negative.   Genitourinary: Negative.   Musculoskeletal:  Positive for back pain. Negative for arthralgias, gait problem, joint swelling, myalgias, neck pain and neck stiffness.  Skin: Negative.   Allergic/Immunologic: Negative.   Neurological: Negative.   Hematological: Negative.   Psychiatric/Behavioral: Negative.         Objective    Vitals: BP (!) 148/88   Pulse 60   Ht '5\' 5"'$  (1.651 m)   Wt 188 lb (85.3 kg)   SpO2 96%   BMI 31.28 kg/m    Physical Exam  General Appearance:    Mildly obese male. Alert, cooperative, in no acute distress, appears stated age  Head:    Normocephalic, without obvious abnormality, atraumatic  Eyes:    PERRL, conjunctiva/corneas clear, EOM's intact, fundi    benign, both eyes       Ears:    Normal TM's and external ear canals, both ears  Neck:   Supple, symmetrical, trachea midline, no adenopathy;       thyroid:  No enlargement/tenderness/nodules; no carotid   bruit or JVD  Back:     Symmetric, no curvature, ROM normal, no CVA tenderness  Lungs:     Clear to auscultation bilaterally, respirations unlabored  Chest wall:    No tenderness or deformity  Heart:    Normal heart rate. Normal rhythm.  3/6 harsh, crescendo-decrescendo, systolic murmur at right upper sternal border and left upper  sternal border  S1 and S2 normal  Abdomen:     Soft, non-tender, bowel sounds active all four quadrants,    no masses, no organomegaly  Genitalia:    deferred  Rectal:    deferred  Extremities:   All extremities are intact. No cyanosis or edema  Pulses:   2+ and symmetric all extremities  Skin:   Skin color, texture, turgor normal, no rashes or lesions  Lymph nodes:   Cervical, supraclavicular, and axillary nodes normal  Neurologic:   CNII-XII intact. Normal strength, sensation and reflexes      throughout      Assessment & Plan    1. Annual physical exam   2. Essential (primary) hypertension SBP elevated. He reports home Bps are better. Work on diet and exercise. Continue current medications.   - Comprehensive metabolic panel  3. Nonrheumatic aortic valve stenosis Minimally symptomatic with mild DOE. Continue regular follow up with Dr. Rockey Situ.  4. Pure hypercholesterolemia He is  tolerating atorvastatin well with no adverse effects.   - CBC - Lipid panel  5. Hyperglycemia  - Hemoglobin A1c  6. Prescription for Shingrix. Vaccine not administered in office.   - Zoster Vaccine Adjuvanted [facility]) injection; Inject 0.5 mLs into the muscle once for 1 dose.  Dispense: 0.5 mL;   7. Prostate cancer screening  - PSA Total (Reflex To Free) ([facility] only)      The entirety of the information documented in the History of Present Illness, Review of Systems and Physical Exam were personally obtained by me. Portions of this information were initially documented by the CMA and reviewed by me for thoroughness and accuracy.     [name], MD  [facility] [phone] (phone) [phone] (fax)  [place]

## 2020-10-04 LAB — COMPREHENSIVE METABOLIC PANEL
ALT: 15 IU/L (ref 0–44)
AST: 19 IU/L (ref 0–40)
Albumin/Globulin Ratio: 2 (ref 1.2–2.2)
Albumin: 4.4 g/dL (ref 3.7–4.7)
Alkaline Phosphatase: 85 IU/L (ref 44–121)
BUN/Creatinine Ratio: 18 (ref 10–24)
BUN: 17 mg/dL (ref 8–27)
Bilirubin Total: 0.8 mg/dL (ref 0.0–1.2)
CO2: 20 mmol/L (ref 20–29)
Calcium: 9.2 mg/dL (ref 8.6–10.2)
Chloride: 104 mmol/L (ref 96–106)
Creatinine, Ser: 0.94 mg/dL (ref 0.76–1.27)
Globulin, Total: 2.2 g/dL (ref 1.5–4.5)
Glucose: 96 mg/dL (ref 65–99)
Potassium: 4.6 mmol/L (ref 3.5–5.2)
Sodium: 138 mmol/L (ref 134–144)
Total Protein: 6.6 g/dL (ref 6.0–8.5)
eGFR: 83 mL/min/{1.73_m2} (ref >59)

## 2020-10-04 LAB — CBC
Hematocrit: 41.2 % (ref 37.5–51.0)
Hemoglobin: 14 g/dL (ref 13.0–17.7)
MCH: 30.5 pg (ref 26.6–33.0)
MCHC: 34 g/dL (ref 31.5–35.7)
MCV: 90 fL (ref 79–97)
Platelets: 246 10*3/uL (ref 150–450)
RBC: 4.59 x10E6/uL (ref 4.14–5.80)
RDW: 13.5 % (ref 11.6–15.4)
WBC: 6 10*3/uL (ref 3.4–10.8)

## 2020-10-04 LAB — PSA TOTAL (REFLEX TO FREE): Prostate Specific Ag, Serum: 2.8 ng/mL (ref 0.0–4.0)

## 2020-10-04 LAB — HEMOGLOBIN A1C
Est. average glucose Bld gHb Est-mCnc: 128 mg/dL
Hgb A1c MFr Bld: 6.1 % — ABNORMAL HIGH (ref 4.8–5.6)

## 2020-10-04 LAB — LIPID PANEL
Chol/HDL Ratio: 3.8 ratio (ref 0.0–5.0)
Cholesterol, Total: 142 mg/dL (ref 100–199)
HDL: 37 mg/dL — ABNORMAL LOW (ref >39)
LDL Chol Calc (NIH): 86 mg/dL (ref 0–99)
Triglycerides: 99 mg/dL (ref 0–149)
VLDL Cholesterol Cal: 19 mg/dL (ref 5–40)

## 2020-10-06 ENCOUNTER — Other Ambulatory Visit: Payer: Self-pay | Admitting: Cardiovascular Disease

## 2020-10-06 NOTE — Telephone Encounter (Signed)
Please review for refill, thanks ! 

## 2020-10-06 NOTE — Telephone Encounter (Signed)
Prescription refill request for Eliquis received. Last office visit:gollan 03/15/20 Scr:0.94 10/03/20 Age: 33mWeight:85.3kg

## 2020-10-30 ENCOUNTER — Other Ambulatory Visit: Payer: Self-pay | Admitting: Family Medicine

## 2021-01-02 DIAGNOSIS — D229 Melanocytic nevi, unspecified: Secondary | ICD-10-CM | POA: Diagnosis not present

## 2021-01-02 DIAGNOSIS — L570 Actinic keratosis: Secondary | ICD-10-CM | POA: Diagnosis not present

## 2021-01-02 DIAGNOSIS — L821 Other seborrheic keratosis: Secondary | ICD-10-CM | POA: Diagnosis not present

## 2021-01-02 DIAGNOSIS — L905 Scar conditions and fibrosis of skin: Secondary | ICD-10-CM | POA: Diagnosis not present

## 2021-01-02 DIAGNOSIS — Z85828 Personal history of other malignant neoplasm of skin: Secondary | ICD-10-CM | POA: Diagnosis not present

## 2021-01-02 DIAGNOSIS — L814 Other melanin hyperpigmentation: Secondary | ICD-10-CM | POA: Diagnosis not present

## 2021-02-21 ENCOUNTER — Other Ambulatory Visit: Payer: Self-pay | Admitting: Cardiovascular Disease

## 2021-02-21 NOTE — Telephone Encounter (Signed)
Please schedule 12 month F/U appointment for 90 day refills. Thank you! 

## 2021-03-04 ENCOUNTER — Encounter: Payer: Self-pay | Admitting: Emergency Medicine

## 2021-03-04 ENCOUNTER — Ambulatory Visit
Admission: EM | Admit: 2021-03-04 | Discharge: 2021-03-04 | Disposition: A | Discharge status: Home / Self Care | Payer: PPO | Attending: Family Medicine | Admitting: Family Medicine

## 2021-03-04 ENCOUNTER — Other Ambulatory Visit: Payer: Self-pay

## 2021-03-04 DIAGNOSIS — U071 COVID-19: Secondary | ICD-10-CM

## 2021-03-04 MED ORDER — BENZONATATE 200 MG PO CAPS: 200.0000 mg | ORAL_CAPSULE | Freq: Three times a day (TID) | ORAL | 0 refills | Status: DC | PRN

## 2021-03-04 MED ORDER — MOLNUPIRAVIR EUA 200MG CAPSULE: 4.0000 | ORAL_CAPSULE | Freq: Two times a day (BID) | ORAL | 0 refills | Status: AC

## 2021-03-04 NOTE — Discharge Instructions (Addendum)
If you develop any severe or worsening of symptoms, seek care at the nearest emergency department. Start antiviral therapy immediately. Tylenol for management of fever. Drink plenty of fluids to prevent dehydration.

## 2021-03-04 NOTE — ED Triage Notes (Signed)
Pt presents with cough, body aches, ST and chest congestion x 3 days. Pt At home Covid test was positive.

## 2021-03-04 NOTE — ED Provider Notes (Signed)
[facility]    CSN: [id number] Arrival date & time: 03/04/21  1005      History   Chief Complaint Chief Complaint  Patient presents with   Cough   Generalized Body Aches   Sore Throat    HPI Jon Mejia is a 81 y.o. male.   HPI Patient presents today for evaluation of cough, body aches, sore throat and exertional shortness of breath tested positive for COVID today. He is here for evaluation. Endorses poor appetite. Attempting to drink plenty of fluids. High risk for complication associated with COVID due to medical problems of atrial fibrillation, hyperlipidemia, and other cardiovascular conditions. He denies shortness of breath or chest pain. 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 Authorizing Provider  amLODipine (NORVASC) 5 MG tablet TAKE 1 TABLET BY MOUTH DAILY 05/31/20  Yes Gollan, Kathlene November, MD  atorvastatin (LIPITOR) 40 MG tablet TAKE ONE TABLET EVERY DAY 10/30/20  Yes Fisher, Kirstie Peri, MD  ELIQUIS 5 MG TABS tablet TAKE ONE (1) TABLET BY MOUTH TWO TIMES PER DAY 10/06/20  Yes Gollan, Kathlene November, MD  metoprolol succinate (TOPROL-XL) 25 MG 24 hr tablet TAKE 1 TABLET BY  MOUTH DAILY WITH OR IMMEDIATELY FOLLOWING A MEAL. 02/27/21  Yes [name], MD  diclofenac sodium (VOLTAREN) 1 % GEL Apply 2 g topically 4 (four) times daily. 01/10/17   [name], MD    Family History Family History  Problem Relation Age of Onset   Arrhythmia Mother        A-Fib   Diabetes Son    Colon cancer Neg Hx    Prostate cancer Neg Hx     Social History Social History   Tobacco Use   Smoking status: Former    Packs/day: 1.00    Years: 20.00    Pack years: 20.00    Types: Cigarettes    Quit date: 03/12/1983    Years since quitting: 38.0   Smokeless tobacco: Never  Vaping Use   Vaping Use: Never used  Substance Use Topics   Alcohol use: No   Drug use: No     Allergies   Patient has no known allergies.   Review of Systems Review of Systems Pertinent negatives listed in HPI   Physical Exam Triage Vital Signs ED Triage Vitals  Enc Vitals Group     BP 03/04/21 1015 (!) 146/67     Pulse Rate 03/04/21 1015 94     Resp 03/04/21 1015 18     Temp 03/04/21 1015 99.7 F (37.6 C)     Temp Source 03/04/21 1015 Oral     SpO2 03/04/21  1015 94 %     Weight --      Height --      Head Circumference --      Peak Flow --      Pain Score 03/04/21 1013 0     Pain Loc --      Pain Edu? --      Excl. in [place]? --    No data found.  Updated Vital Signs BP (!) 146/67 (BP Location: Left Arm)    Pulse 94 Comment: irregular   Temp 99.7 F (37.6 C) (Oral)    Resp 18    SpO2 94%   Visual Acuity Right Eye Distance:   Left Eye Distance:   Bilateral Distance:    Right Eye Near:   Left Eye Near:    Bilateral Near:     Physical Exam Constitutional:      Appearance: He is ill-appearing.  HENT:     Head: Normocephalic and atraumatic.  Eyes:     Conjunctiva/sclera: Conjunctivae normal.     Pupils: Pupils are equal, round, and reactive to light.  Cardiovascular:     Rate and Rhythm: Normal rate. Rhythm regularly irregular.     Heart sounds: Murmur  heard.     Comments: Pt has aortic stenosis and paroxysmal atrial fibrillation  Musculoskeletal:     Cervical back: Normal range of motion and neck supple.  Skin:    General: Skin is warm.     Capillary Refill: Capillary refill takes less than 2 seconds.  Neurological:     Mental Status: He is alert.  Psychiatric:        Attention and Perception: Attention normal.        Mood and Affect: Mood normal.        Speech: Speech normal.        Behavior: Behavior normal.        Thought Content: Thought content normal.        Cognition and Memory: Cognition normal.        Judgment: Judgment normal.     UC Treatments / Results  Labs (all labs ordered are listed, but only abnormal results are displayed) Labs Reviewed - No data to display  EKG   Radiology No results found.  Procedures Procedures (including critical care time)  Medications Ordered in UC Medications - No data to display  Initial Impression / Assessment and Plan / UC Course  I have reviewed the triage vital signs and the nursing notes.  Pertinent labs & imaging results that were available during my care of the patient were reviewed by me and considered in my medical decision making (see chart for details).    COVID-19 positive per home test. Start antiviral therapy with Molnupirarvir  Benzonatate PRN for cough Strict ER precautions if any red flag symptoms develop RTC PRN  Final Clinical Impressions(s) / UC Diagnoses   Final diagnoses:  COVID-19 virus infection     Discharge Instructions      If you develop any severe or worsening of symptoms, seek care at the nearest emergency department. Start antiviral therapy immediately. Tylenol for management of fever. Drink plenty of fluids to prevent dehydration.    ED Prescriptions     Medication Sig Dispense Auth. Provider   molnupiravir EUA (LAGEVRIO) 200 mg CAPS capsule Take 4 capsules (800 mg total) by mouth 2 (two) times daily for 5 days. 40 capsule  Scot Jun, FNP   benzonatate (TESSALON) 200 MG capsule Take 1 capsule (  200 mg total) by mouth 3 (three) times daily as needed for cough. 40 capsule Scot Jun, FNP      PDMP not reviewed this encounter.   Scot Jun, FNP 03/04/21 1101

## 2021-03-05 ENCOUNTER — Ambulatory Visit: Payer: Self-pay

## 2021-03-05 NOTE — Telephone Encounter (Signed)
Chief Complaint: Sore throat/COVID positive Symptoms: COVID positive, cough, body aches Frequency: Sore throat since yesterday Pertinent Negatives: Patient denies other symptoms Disposition: [] ED /[] Urgent Care (no appt availability in office) / [] Appointment(In office/virtual)/ []  [place] [facility]/ [x] Home Care/ [] Refused Recommended Disposition /[] [place] [facility]/ []  Follow-up with PCP Additional Notes: Tested COVID positive at UC on yesterday. Sore throat moderate, able to swallow liquids. Advised viral, but I will send to Dr. Caryn Section to be aware and offer any advice.  Summary: medication for severely sore throat   Patient wife called in to inform Dr Caryn Section stated that they both tested positive for Covid on 03/03/21 and was started on antiviral medication. Want to know if Dr Caryn Section could prescribe something for his severely sore throat since he is having a very difficult time swallowing. Would like a call back at Ph# [phone]      Reason for Disposition  [1] Sore throat with cough/cold symptoms AND [2] present < 5 days  Answer Assessment - Initial Assessment Questions 1. ONSET: "When did the throat start hurting?" (Hours or days ago)      Yesterday afternoon 2. SEVERITY: "How bad is the sore throat?" (Scale 1-10; mild, moderate or severe)   - MILD (1-3):  doesn't interfere with eating or normal activities   - MODERATE (4-7): interferes with eating some solids and normal activities   - SEVERE (8-10):  excruciating pain, interferes with most normal activities   - SEVERE DYSPHAGIA: can't swallow liquids, drooling     Moderate 3. STREP EXPOSURE: "Has there been any exposure to strep within the past week?" If Yes, ask: "What type of contact occurred?"      No 4.  VIRAL SYMPTOMS: "Are there any symptoms of a cold, such as a runny nose, cough, hoarse voice or red eyes?"      Has COVID 5. FEVER: "Do you have a fever?" If Yes, ask: "What is your temperature, how was it measured,  and when did it start?"     Don't have a thermometer, feels feverish by touch 6. PUS ON THE TONSILS: "Is there pus on the tonsils in the back of your throat?"     N/A 7. OTHER SYMPTOMS: "Do you have any other symptoms?" (e.g., difficulty breathing, headache, rash)     Body aches, cough 8. PREGNANCY: "Is there any chance you are pregnant?" "When was your last menstrual period?"     N/A  Protocols used: Sore Throat-A-AH

## 2021-03-06 NOTE — Telephone Encounter (Signed)
Patients wife advised and verbalized understanding.  

## 2021-03-06 NOTE — Telephone Encounter (Signed)
There is no prescription medication for this, but he can take 2 tylenol every six hours as needed and gargle with warm salt water very 2-3 hours.

## 2021-03-19 NOTE — Progress Notes (Signed)
Cardiology Office Note  Date:  03/20/2021   ID:  Jon Mejia, DOB: 01/28/1941, MRN: [id number]  PCP:  [name], MD   Chief Complaint  Patient presents with   12 month follow up     "Doing well." Medications reviewed by the patient verbally.     HPI:  [name] is a 81 y.o. male who has a PMHx of: Stroke Mild  aortic valve stenosis Pure hypercholesterolemia HTN Premature atrial beats quit smoking about 35 years ago. His smoking use included cigarettes. He has a 20.00 pack-year smoking history. 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 Aortic valve area, by VTI measures  1.52 cm. Aortic valve mean gradient measures 12.5 mmHg.   Lab work reviewed HGBA1C 5.9 Total chol 169 CR 1.03  EKG personally reviewed by myself on todays visit Normal sinus rhythm rate 63 bpm no significant ST-T wave changes  Past medical history reviewed 02/2018  scheduled for TEE and placement of loop monitor This was cancelled after noting he had run of atrial fib this AM on telemetry Started on eliquis 5 BID  EKG personally reviewed by myself on todays visit Shows normal sinus rhythm rate 69 bpm no significant ST-T wave changes  Other past medical history reviewed ED visit on 03/13/2018 for sudden onset decreased vision while working outside around 2pm. He initially had complete vision loss, but eventually was just seeing shadows. Never happened before. He denied numbness, tingling, or focal weakness. He denies headache. His wife states he has been tired recently and last night was feeling dizzy. Code stroke called on presentation and patient  taken given alteplase.    He was diagnosed with acute stroke symptoms and was admitted to ICU per neurology for monitoring. The patient was discharged 03/15/2018.   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 Punctate acute infarct in the posterior right frontal lobe. 2. Small chronic cerebellar infarct.  CT neck on 03/13/2018 showed: 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 On the right, there is soft and calcified plaque. Maximal stenosis in the right ICA bulb is 30%. No measurable stenosis on the left.    PMH:   has a past medical history of CVA (cerebral vascular accident) ([place]) (03/20/2018), History of chicken pox, History of measles, Hyperlipidemia, and Hypertension.  PSH:    Past Surgical History:  Procedure Laterality Date   CARDIAC CATHETERIZATION  11/2017   No stents/Fath   SKIN CANCER EXCISION  07/2020    Current Outpatient Medications  Medication Sig Dispense Refill   amLODipine (NORVASC) 5 MG tablet TAKE 1 TABLET BY MOUTH DAILY 90 tablet 3   atorvastatin (LIPITOR) 40 MG tablet TAKE ONE TABLET EVERY DAY 90 tablet 4   benzonatate (TESSALON) 200 MG capsule Take 1 capsule (200 mg total) by mouth 3 (three) times daily as needed for cough. 40 capsule 0   diclofenac sodium (VOLTAREN) 1 % GEL Apply 2 g topically 4 (four) times daily. 100 g 2   ELIQUIS 5 MG TABS tablet TAKE ONE (1) TABLET BY  MOUTH TWO TIMES PER DAY 180 tablet 1   metoprolol succinate (TOPROL-XL) 25 MG 24 hr tablet TAKE 1 TABLET BY MOUTH DAILY WITH OR IMMEDIATELY FOLLOWING A MEAL. 90 tablet 0   No current facility-administered medications for this visit.     ALLERGIES:   Patient has no known allergies.   SOCIAL HISTORY:  The patient  reports that he quit smoking about 38 years ago. His smoking use included cigarettes. He has a 20.00 pack-year smoking history. He has never used smokeless  tobacco. He reports that he does not drink alcohol and does not use drugs.   FAMILY HISTORY:   family history includes Arrhythmia in his mother; Diabetes in his son.    REVIEW OF SYSTEMS: Review of Systems  Constitutional: Negative.   HENT: Negative.    Eyes: Negative.   Respiratory: Negative.  Negative for shortness of breath.   Cardiovascular: Negative.  Negative for chest pain.  Gastrointestinal: Negative.   Genitourinary: Negative.   Musculoskeletal: Negative.   Neurological: Negative.   Psychiatric/Behavioral: Negative.    All other systems reviewed and are negative.  PHYSICAL EXAM: VS:  BP 130/60 (BP Location: Left Arm, Patient Position: Sitting, Cuff Size: Normal)    Pulse 63    Ht 5\' 5"  (1.651 m)    Wt 183 lb (83 kg)    SpO2 96%    BMI 30.45 kg/m  , BMI Body mass index is 30.45 kg/m. Constitutional:  oriented to person, place, and time. No distress.  HENT:  Head: Grossly normal Eyes:  no discharge. No scleral icterus.  Neck: No JVD, no carotid bruits  Cardiovascular: Regular rate and rhythm, 2/6 systolic ejection murmur appreciated right sternal border  Pulmonary/Chest: Clear to auscultation bilaterally, no wheezes or rails Abdominal: Soft.  no distension.  no tenderness.  Musculoskeletal: Normal range of motion Neurological:  normal muscle tone. Coordination normal. No atrophy Skin: Skin warm and dry Psychiatric: normal affect, pleasant  RECENT LABS: 10/03/2020: ALT 15; BUN 17; Creatinine, Ser 0.94; Hemoglobin 14.0; Platelets 246; Potassium 4.6; Sodium 138    LIPID PANEL: Lab Results  Component Value Date   CHOL 142 10/03/2020   HDL 37 (L) 10/03/2020   LDLCALC 86 10/03/2020   TRIG 99 10/03/2020      WEIGHT: Wt Readings from Last 3 Encounters:  03/20/21 183 lb (83 kg)  10/03/20 188 lb (85.3 kg)  03/15/20 189 lb (85.7 kg)     ASSESSMENT AND PLAN:   History of stroke paroxysmal atrial fibrillation, previously documented February 2020 Denies any TIA or  stroke symptoms, tolerating anticoagulation, no significant symptoms of tachypalpitations, we will continue metoprolol at current dose okay you need your coupons  Paroxysmal atrial fibrillation Plan as above, continue metoprolol with Eliquis  Essential hypertension Blood pressure is well controlled on today's visit. No changes made to the medications.  Hyperlipidemia  Cholesterol is at goal on the current lipid regimen. No changes to the medications were made.  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 03/20/2021  [place], [place]

## 2021-03-20 ENCOUNTER — Encounter: Payer: Self-pay | Admitting: Cardiovascular Disease

## 2021-03-20 ENCOUNTER — Ambulatory Visit: Payer: PPO | Admitting: Cardiovascular Disease

## 2021-03-20 VITALS — BP 130/60 | HR 63 | Ht 65.0 in | Wt 183.0 lb

## 2021-03-20 DIAGNOSIS — I639 Cerebral infarction, unspecified: Secondary | ICD-10-CM | POA: Diagnosis not present

## 2021-03-20 DIAGNOSIS — I480 Paroxysmal atrial fibrillation: Secondary | ICD-10-CM

## 2021-03-20 DIAGNOSIS — I10 Essential (primary) hypertension: Secondary | ICD-10-CM | POA: Diagnosis not present

## 2021-03-20 DIAGNOSIS — E7800 Pure hypercholesterolemia, unspecified: Secondary | ICD-10-CM

## 2021-03-20 DIAGNOSIS — I350 Nonrheumatic aortic (valve) stenosis: Secondary | ICD-10-CM | POA: Diagnosis not present

## 2021-03-20 NOTE — Patient Instructions (Addendum)
Medication Instructions:  No changes  If you need a refill on your cardiac medications before your next appointment, please call your pharmacy.   Lab work: No new labs needed  Testing/Procedures: ECHO (summer 2023) Your physician has requested that you have an echocardiogram. Echocardiography is a painless test that uses sound waves to create images of your heart. It provides your doctor with information about the size and shape of your heart and how well your hearts chambers and valves are working. This procedure takes approximately one hour. There are no restrictions for this procedure.  There is a possibility that an IV may need to be started during your test to inject an image enhancing agent. This is done to obtain more optimal pictures of your heart. Therefore we ask that you do at least drink some water prior to coming in to hydrate your veins.    Follow-Up: At [facility], you and your health needs are our priority.  As part of our continuing mission to provide you with exceptional heart care, we have created designated Provider Care Teams.  These Care Teams include your primary Cardiologist (physician) and Advanced Practice Providers (APPs -  Physician Assistants and Nurse Practitioners) who all work together to provide you with the care you need, when you need it.  You will need a follow up appointment in 12 months  Providers on your designated Care Team:   Murray Hodgkins, NP Christell Faith, PA-C Cadence Kathlen Mody, [place]  COVID-19 Vaccine Information can be found at: [email or web address].uk For questions related to vaccine distribution or appointments, please email [email or web address] or call [phone].

## 2021-06-20 ENCOUNTER — Other Ambulatory Visit: Payer: Self-pay | Admitting: Cardiovascular Disease

## 2021-07-03 DIAGNOSIS — Z1283 Encounter for screening for malignant neoplasm of skin: Secondary | ICD-10-CM | POA: Diagnosis not present

## 2021-07-03 DIAGNOSIS — Z85828 Personal history of other malignant neoplasm of skin: Secondary | ICD-10-CM | POA: Diagnosis not present

## 2021-07-03 DIAGNOSIS — L570 Actinic keratosis: Secondary | ICD-10-CM | POA: Diagnosis not present

## 2021-07-03 DIAGNOSIS — R739 Hyperglycemia, unspecified: Secondary | ICD-10-CM | POA: Diagnosis not present

## 2021-07-03 DIAGNOSIS — Z8673 Personal history of transient ischemic attack (TIA), and cerebral infarction without residual deficits: Secondary | ICD-10-CM | POA: Diagnosis not present

## 2021-08-06 ENCOUNTER — Other Ambulatory Visit: Payer: Self-pay | Admitting: Cardiovascular Disease

## 2021-08-16 ENCOUNTER — Ambulatory Visit (INDEPENDENT_AMBULATORY_CARE_PROVIDER_SITE_OTHER): Payer: PPO

## 2021-08-16 DIAGNOSIS — I350 Nonrheumatic aortic (valve) stenosis: Secondary | ICD-10-CM

## 2021-08-16 LAB — ECHOCARDIOGRAM COMPLETE
AR max vel: 0.79 cm2
AV Area VTI: 0.72 cm2
AV Area mean vel: 0.7 cm2
AV Mean grad: 18.5 mmHg
AV Peak grad: 31.4 mmHg
Ao pk vel: 2.8 m/s
Area-P 1/2: 3.05 cm2
Calc EF: 66.8 %
S' Lateral: 2.9 cm
Single Plane A2C EF: 60.5 %
Single Plane A4C EF: 68.8 %

## 2021-08-19 ENCOUNTER — Encounter: Payer: Self-pay | Admitting: Cardiovascular Disease

## 2021-08-22 ENCOUNTER — Encounter: Payer: Self-pay | Admitting: Medical

## 2021-08-22 ENCOUNTER — Ambulatory Visit: Payer: PPO | Admitting: Medical

## 2021-08-22 VITALS — BP 125/76 | HR 66 | Ht 65.0 in | Wt 181.8 lb

## 2021-08-22 DIAGNOSIS — R42 Dizziness and giddiness: Secondary | ICD-10-CM | POA: Diagnosis not present

## 2021-08-22 DIAGNOSIS — I350 Nonrheumatic aortic (valve) stenosis: Secondary | ICD-10-CM | POA: Diagnosis not present

## 2021-08-22 DIAGNOSIS — I10 Essential (primary) hypertension: Secondary | ICD-10-CM | POA: Diagnosis not present

## 2021-08-22 DIAGNOSIS — H538 Other visual disturbances: Secondary | ICD-10-CM | POA: Diagnosis not present

## 2021-08-22 DIAGNOSIS — Z8673 Personal history of transient ischemic attack (TIA), and cerebral infarction without residual deficits: Secondary | ICD-10-CM

## 2021-08-22 DIAGNOSIS — E782 Mixed hyperlipidemia: Secondary | ICD-10-CM

## 2021-08-22 NOTE — Patient Instructions (Signed)
Medication Instructions:  Your physician recommends that you continue on your current medications as directed. Please refer to the Current Medication list given to you today.  *If you need a refill on your cardiac medications before your next appointment, please call your pharmacy*   Lab Work: None ordered If you have labs (blood work) drawn today and your tests are completely normal, you will receive your results only by: [place] (if you have MyChart) OR A paper copy in the mail If you have any lab test that is abnormal or we need to change your treatment, we will call you to review the results.   Testing/Procedures: None ordered   Follow-Up: At [facility], you and your health needs are our priority.  As part of our continuing mission to provide you with exceptional heart care, we have created designated Provider Care Teams.  These Care Teams include your primary Cardiologist (physician) and Advanced Practice Providers (APPs -  Physician Assistants and Nurse Practitioners) who all work together to provide you with the care you need, when you need it.  We recommend signing up for the patient portal called "MyChart".  Sign up information is provided on this After Visit Summary.  MyChart is used to connect with patients for Virtual Visits (Telemedicine).  Patients are able to view lab/test results, encounter notes, upcoming appointments, etc.  Non-urgent messages can be sent to your provider as well.   To learn more about what you can do with MyChart, go to [email or web address].    Your next appointment:   4 month(s)  The format for your next appointment:   In Person  Provider:   You may see Ida Rogue, MD or one of the following Advanced Practice Providers on your designated Care Team:   Murray Hodgkins, NP Christell Faith, PA-C Cadence Kathlen Mody, PA-C{    You have been referred to [facility]. They will contact you to schedule  You have been referred to [facility]  Neurology. Their office will contact you to schedule     Other Instructions N/A  Important Information About Sugar

## 2021-08-22 NOTE — Progress Notes (Signed)
Cardiology Office Note:    Date:  08/22/2021   ID:  Jon Mejia, DOB 1940-02-05, MRN [id number]  PCP:  Jon Sons, MD  [facility] Cardiologist:  None  [facility] Electrophysiologist:  None   Referring MD: Jon Sons, MD   Chief Complaint: Dizziness, echo results  History of Present Illness:    Jon Mejia is a 81 y.o. male with a hx of stroke, mild AS, hypercholesterolemia, HTN, PACs, prior tobacco use who presents for follow-up.   HE had a stroke  in 02/2018. An ILR was going to be placed, but afib was noted on telemtry and ILR was not placed. He was started on Eliquis.   Last seen 03/20/21 and was doing well. Echo was ordered for murmur.  Most recent echo showed normal LVEF, mild LVH, mild MR, moderate AS mean gradient 18.21mHg, severe valve stenosis.  Today, the echo was reviewed, shows almost severe AS (reviewed with MD).  He reports intermittent lightheadedness at times. He had an episode of blurry vision that lasted 10 minutes. No other neurological symptoms reported. This was somewhat similar to prior stroke. No further episodes since then. EMS was called and they reported rhythm was Afib. He denies missing doses of Eliquis. He denies chest pain, SOB, LLE, orthopnea, or pnd.  Past Medical History:  Diagnosis Date   [place]   History of chicken pox    History of measles    Hyperlipidemia    Hypertension     Past Surgical History:  Procedure Laterality Date   CARDIAC CATHETERIZATION  11/2017   No stents/Fath   SKIN CANCER EXCISION  07/2020    Current Medications: Current Meds  Medication Sig   amLODipine (NORVASC) 5 MG tablet TAKE 1 TABLET BY MOUTH DAILY   atorvastatin (LIPITOR) 40 MG tablet TAKE ONE TABLET EVERY DAY   diclofenac sodium (VOLTAREN) 1 % GEL Apply 2 g topically 4 (four) times daily.   ELIQUIS 5 MG TABS tablet TAKE ONE (1) TABLET BY MOUTH TWO TIMES PER DAY   metoprolol succinate  (TOPROL-XL) 25 MG 24 hr tablet TAKE 1 TABLET BY MOUTH DAILY WITH OR IMMEDIATELY FOLLOWING A MEAL.     Allergies:   Patient has no known allergies.   Social History   Socioeconomic History   Marital status: Married    Spouse name: Jon Mejia  Number of children: 2   Years of education: Not on file   Highest education level: 12th grade  Occupational History   Occupation: Retired  Tobacco Use   Smoking status: Former    Packs/day: 1.00    Years: 20.00    Total pack years: 20.00    Types: Cigarettes    Quit date: 03/12/1983    Years since quitting: 38.4   Smokeless tobacco: Never  Vaping Use   Vaping Use: Never used  Substance and Sexual Activity   Alcohol use: No   Drug use: No   Sexual activity: Not on file  Other Topics Concern   Not on file  Social History Narrative   Not on file   Social Determinants of Health   Financial Resource Strain: Low Risk  (08/25/2019)   Overall Financial Resource Strain (CARDIA)    Difficulty of Paying Living Expenses: Not hard at all  Food Insecurity: No Food Insecurity (08/25/2019)   Hunger Vital Sign    Worried About Running Out of Food in the Last Year: Never true    Ran Out  of Food in the Last Year: Never true  Transportation Needs: No Transportation Needs (08/25/2019)   PRAPARE - Hydrologist (Medical): No    Lack of Transportation (Non-Medical): No  Physical Activity: Inactive (08/25/2019)   Exercise Vital Sign    Days of Exercise per Week: 0 days    Minutes of Exercise per Session: 0 min  Stress: No Stress Concern Present (08/25/2019)   [place]    Feeling of Stress : Not at all  Social Connections: Moderately Integrated (08/25/2019)   Social Connection and Isolation Panel [NHANES]    Frequency of Communication with Friends and Family: Three times a week    Frequency of Social Gatherings with Friends and Family: More than three times a week     Attends Religious Services: More than 4 times per year    Active Member of [employer] or Organizations: No    Attends Archivist Meetings: Never    Marital Status: Married     Family History: The patient's family history includes Arrhythmia in his mother; Diabetes in his son. There is no history of Colon cancer or Prostate cancer.  ROS:   Please see the history of present illness.     All other systems reviewed and are negative.  EKGs/Labs/Other Studies Reviewed:    The following studies were reviewed today:  Echo 07/2021  1. Left ventricular ejection fraction, by estimation, is 60 to 65%. The  left ventricle has normal function. The left ventricle has no regional  wall motion abnormalities. There is mild left ventricular hypertrophy.  Left ventricular diastolic parameters  are consistent with Grade I diastolic dysfunction (impaired relaxation).   2. Right ventricular systolic function is normal. The right ventricular  size is normal.   3. The mitral valve is normal in structure. Mild mitral valve  regurgitation. No evidence of mitral stenosis. Moderate mitral annular  calcification.   4. The aortic valve is normal in structure. There is moderate  calcification of the aortic valve. Aortic valve regurgitation is not  visualized. Moderate aortic valve stenosis by Aortic valve mean gradient  measures 18.5 mmHg. Aortic valve Vmax measures  2.80 m/s. Severe valve stenosis by estimated AVA 0.72 cm sq   5. The inferior vena cava is normal in size with greater than 50%  respiratory variability, suggesting right atrial pressure of 3 mmHg.   Comparison(s): EF 55%, mild AS mean 12.29mHg, peak 21.232mg, mild-mod MR,  mod MAC.   EKG:  EKG is ordered today.  The ekg ordered today demonstrates NSR 66bpm, no ST/T wave changes  Recent Labs: 10/03/2020: ALT 15; BUN 17; Creatinine, Ser 0.94; Hemoglobin 14.0; Platelets 246; Potassium 4.6; Sodium 138  Recent Lipid Panel    Component  Value Date/Time   CHOL 142 10/03/2020 0935   TRIG 99 10/03/2020 0935   HDL 37 (L) 10/03/2020 0935   CHOLHDL 3.8 10/03/2020 0935   CHOLHDL 4.5 03/14/2018 0711   VLDL 18 03/14/2018 0711   LDLCALC 86 10/03/2020 0935   LDLCALC 119 (H) 01/09/2017 0906    Physical Exam:    VS:  BP 125/76 (BP Location: Left Arm, Patient Position: Sitting, Cuff Size: Normal)   Pulse 66   Ht _0  (1.651 m)   Wt 181 lb 12.8 oz (82.5 kg)   SpO2 94%   BMI 30.25 kg/m     Wt Readings from Last 3 Encounters:  08/22/21 181 lb 12.8 oz (82.5 kg)  03/20/21 183 lb (83 kg)  10/03/20 188 lb (85.3 kg)     GEN:  Well nourished, well developed in no acute distress HEENT: Normal NECK: No JVD; No carotid bruits LYMPHATICS: No lymphadenopathy CARDIAC: RRR, no murmurs, rubs, gallops RESPIRATORY:  Clear to auscultation without rales, wheezing or rhonchi  ABDOMEN: Soft, non-tender, non-distended MUSCULOSKELETAL:  No edema; No deformity  SKIN: Warm and dry NEUROLOGIC:  Alert and oriented x 3 PSYCHIATRIC:  Normal affect   ASSESSMENT:    1. Blurry vision   2. Nonrheumatic aortic valve stenosis   3. History of CVA (cerebrovascular accident)   4. Dizziness   5. Aortic valve stenosis, etiology of cardiac valve disease unspecified   6. Hyperlipidemia, mixed   7. Essential hypertension    PLAN:    In order of problems listed above:  Blurry vision H/o stroke Patient reports recent episode of blurry vision that lasted 10 minutes. No other neurological changes. Symptoms were similar to prior stroke. EMS was called who reported he was in Afib. Denies missing doses of Eliquis. He did not want to go to the ER for further work-up. I will refer him to neurology. No neurological deficits today.   Paroxysmal Afib He is in NSR today. Continue Eliquis 31m BID. Continue rate control with Toprol 251mdaily.   HTN BP is good, continue current medications.   HLD PCP will update lab work. LDL 86 09/2020. Continue Lipitor.    Dizziness Moderate to severe AS Recent echo showed moderate to severe AS with preserved EF. He reports occasional lightheadedness and dizziness. Orthostatics negative. No signs of volume overload. I will refer him to the structural valve team.    Disposition: Follow up in 4 month(s) with MD/APP     Signed, Larysa Pall H Ninfa MeekerPA-C  08/22/2021 3:57 PM    [place]

## 2021-09-04 ENCOUNTER — Institutional Professional Consult (permissible substitution): Payer: PPO | Admitting: Internal Medicine

## 2021-09-05 ENCOUNTER — Encounter: Payer: Self-pay | Admitting: Neurology

## 2021-09-05 ENCOUNTER — Encounter: Payer: Self-pay | Admitting: Cardiovascular Disease

## 2021-09-05 ENCOUNTER — Ambulatory Visit: Payer: PPO | Admitting: Neurology

## 2021-09-05 VITALS — BP 149/87 | HR 66 | Ht 65.0 in | Wt 180.6 lb

## 2021-09-05 DIAGNOSIS — H5360 Unspecified night blindness: Secondary | ICD-10-CM | POA: Diagnosis not present

## 2021-09-05 DIAGNOSIS — I480 Paroxysmal atrial fibrillation: Secondary | ICD-10-CM | POA: Diagnosis not present

## 2021-09-05 DIAGNOSIS — G459 Transient cerebral ischemic attack, unspecified: Secondary | ICD-10-CM

## 2021-09-05 DIAGNOSIS — H538 Other visual disturbances: Secondary | ICD-10-CM

## 2021-09-05 NOTE — Patient Instructions (Addendum)
I had along d/w patient and his daughter about his episode of transient right eye blurred vision, discussed differential diagnosis, evaluation plan and answered questions. Check ESR, Lipid profile, HbA1c, MRI brain and MRAs of brain and neck. Continue eliquis for stroke prevention for his atrial fibrillation and aggressive risk factor modification. Follow up with ophthalmology for detailed eye exam.Return for f/u in 3 months or call earlier if needed.

## 2021-09-05 NOTE — Progress Notes (Signed)
[facility] [address] [place]. [place] [address] [phone]       OFFICE CONSULT NOTE  Jon Mejia Date of Birth:  01-Nov-1940 Medical Record Number:  [id number]   Referring MD:  Cadence Kathlen Mody PA-c  Reason for Referral:  blurred vision  HPI: Mr. Milstein is a 81 year old pleasant Caucasian male seen today for office consultation visit for blurred vision.  He is accompanied by his daughter.  History is obtained from them and review of electronic medical records and I personally reviewed pertinent available imaging films in PACS.  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  Patient was seen by me at that time and then lost to follow-up since last office video visit 04/2118.  Patient states he was doing well about a week ago when he developed sudden onset of painless right eye blurred vision.  This lasted only about 10 minutes and recovered completely.  He states this is normally the nasal portion of the right eye does not completely affect the whole eye but he could see  from the corner of his eye.  He denies any accompanying headache, slurred speech, diplopia, vertigo, gait or balance problems or extremity weakness he states that a few months ago he was had a complete eye exam by ophthalmologist and spine.  Patient requests follow-up paroxysmal A-fib and states he has been compliant with did not miss any doses he is also on Lipitor 40 mg which is tolerating well without pain.  His blood pressure is well-controlled at home and whitecoat hypertension and lives at home with his wife and he is fully independent in all activities of daily living.  He denies any symptoms of scalp tenderness, jaw claudication, muscle aches and pains or frequent headaches with loss of vision.  Lab work on 10/03/2020 shows LDL cholesterol 86 mg percent and  hemoglobin A1c is 6.1.  No recent brain imaging studies available for review today.  MRI scan 03/14/2018 had shown a tiny right posterior frontal punctate infarct small chronic cerebellar infarct left.  CT angiogram of brain and neck on 03/13/2018 showed mild atherosclerotic changes at both carotid bifurcations 30% right ICA bulb and 50% left vertebral artery origin stenosis. ROS:   14 system review of systems is positive for blurred vision only and all other systems negative  PMH:  Past Medical History:  Diagnosis Date   CVA (cerebral vascular accident) ([place]) 03/20/2018   History of chicken pox    History of measles    Hyperlipidemia    Hypertension     Social History:  Social History   Socioeconomic History   Marital status: Married    Spouse name: [place]   Number of children: 2   Years of education: Not on file   Highest education level: 12th grade  Occupational History   Occupation: Retired  Tobacco Use   Smoking status: Former    Packs/day: 1.00    Years: 20.00    Total pack years: 20.00    Types: Cigarettes    Quit date: 03/12/1983    Years since quitting: 38.5   Smokeless tobacco: Never  Vaping Use   Vaping Use: Never used  Substance and Sexual Activity   Alcohol use: No   Drug use: No   Sexual activity: Not on file  Other Topics Concern   Not on file  Social History Narrative   Not on  file   Social Determinants of Health   Financial Resource Strain: Low Risk  (08/25/2019)   Overall Financial Resource Strain (CARDIA)    Difficulty of Paying Living Expenses: Not hard at all  Food Insecurity: No Food Insecurity (08/25/2019)   Hunger Vital Sign    Worried About Running Out of Food in the Last Year: Never true    Ran Out of Food in the Last Year: Never true  Transportation Needs: No Transportation Needs (08/25/2019)   PRAPARE - Hydrologist (Medical): No    Lack of Transportation (Non-Medical): No  Physical Activity: Inactive  (08/25/2019)   Exercise Vital Sign    Days of Exercise per Week: 0 days    Minutes of Exercise per Session: 0 min  Stress: No Stress Concern Present (08/25/2019)   [place]    Feeling of Stress : Not at all  Social Connections: Moderately Integrated (08/25/2019)   Social Connection and Isolation Panel [NHANES]    Frequency of Communication with Friends and Family: Three times a week    Frequency of Social Gatherings with Friends and Family: More than three times a week    Attends Religious Services: More than 4 times per year    Active Member of [employer] or Organizations: No    Attends Archivist Meetings: Never    Marital Status: Married  Human resources officer Violence: Not At Risk (08/25/2019)   Humiliation, Afraid, Rape, and Kick questionnaire    Fear of Current or Ex-Partner: No    Emotionally Abused: No    Physically Abused: No    Sexually Abused: No    Medications:   Current Outpatient Medications on File Prior to Visit  Medication Sig Dispense Refill   amLODipine (NORVASC) 5 MG tablet TAKE 1 TABLET BY MOUTH DAILY 90 tablet 1   atorvastatin (LIPITOR) 40 MG tablet TAKE ONE TABLET EVERY DAY 90 tablet 4   diclofenac sodium (VOLTAREN) 1 % GEL Apply 2 g topically 4 (four) times daily. 100 g 2   ELIQUIS 5 MG TABS tablet TAKE ONE (1) TABLET BY MOUTH TWO TIMES PER DAY 180 tablet 1   metoprolol succinate (TOPROL-XL) 25 MG 24 hr tablet TAKE 1 TABLET BY MOUTH DAILY WITH OR IMMEDIATELY FOLLOWING A MEAL. 90 tablet 1   No current facility-administered medications on file prior to visit.    Allergies:  No Known Allergies  Physical Exam General: Mildly obese elderly Caucasian male, seated, in no evident distress Head: head normocephalic and atraumatic.   Neck: supple with no carotid or supraclavicular bruits Cardiovascular: regular rate and rhythm, soft ejection systolic murmur. Musculoskeletal: no deformity Skin:  no  rash/petichiae Vascular:  Normal pulses all extremities  Neurologic Exam Mental Status: Awake and fully alert. Oriented to place and time. Recent and remote memory intact. Attention span, concentration and fund of knowledge appropriate. Mood and affect appropriate.  Cranial Nerves: Fundoscopic exam reveals sharp disc margins. Pupils equal, briskly reactive to light. Extraocular movements full without nystagmus. Visual fields full to confrontation. Hearing intact. Facial sensation intact. Face, tongue, palate moves normally and symmetrically.  Motor: Normal bulk and tone. Normal strength in all tested extremity muscles. Sensory.: intact to touch , pinprick , position and vibratory sensation.  Coordination: Rapid alternating movements normal in all extremities. Finger-to-nose and heel-to-shin performed accurately bilaterally. Gait and Station: Arises from chair without difficulty. Stance is normal. Gait demonstrates normal stride length and balance . Able to  heel, toe and tandem walk without difficulty.  Reflexes: 1+ and symmetric. Toes downgoing.   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  Vascular risk factors of hypertension, hyperlipidemia, atrial fibrillation, cerebrovascular disease and mild obesity     PLAN:I had along d/w patient and his daughter about his episode of transient right eye blurred vision, discussed differential diagnosis, evaluation plan and answered questions. Check ESR, Lipid profile, HbA1c, MRI brain and MRAs of brain and neck. Continue eliquis for stroke prevention for his atrial fibrillation and aggressive risk factor modification. Follow up with ophthalmology for detailed eye exam.Return  for f/u in 3 months or call earlier if needed.  Greater than 50% time during this 45-minute consultation visit was spent on counseling and coordination of care about his transient blurred vision and discussion about evaluation and treatment and answering questions.  Antony Contras, MD Note: This document was prepared with digital dictation and possible smart phrase technology. Any transcriptional errors that result from this process are unintentional.

## 2021-09-05 NOTE — H&P (View-Only) (Signed)
[facility] Consult Note  Chief Complaint  Jon Mejia presents with   New Jon Mejia (Initial Visit)    Aortic stenosis   History of Present Illness: 81 yo male with history of prior CVA, atrial fibrillation, PACs, former tobacco abuse, HTN, hyperlipidemia and aortic stenosis who is here today as a new consult, referred by Dr. Rockey Situ, for further discussion regarding his aortic stenosis and possible TAVR. He has paroxysmal atrial fibrillation and is on Eliquis. He had a stroke in 2020. Report in the records of normal cardiac cath in 2019 but I cannot see this report. He has recently been having dizziness. Echo 08/16/21 with LVEF=60-65%. Mild LVH. Normal RV size and function. The aortic valve is calcified and thickened with mean gradient 18.5 mmHg, peak gradient 31.4 mmHg, AVA 0.7 cm2, DI 0.25, SVI 25. This is consistent with low flow/low gradient severe aortic stenosis.   He tells me today that he has progressive dyspnea on exertion, fatigue. He has some dizziness but no near syncope. He has no chest pain. He has no lower extremity edema. He lives in [place], [place] with his wife. He is retired from [employer]. He has partial dentures on the top. No active dental issues.   Primary Care Physician: [name], MD Primary Cardiologist: Rockey Situ Referring Cardiologist: Rockey Situ  Past Medical History:  Diagnosis Date   Aortic stenosis    CVA (cerebral vascular accident) ([place]) 03/20/2018   History of chicken pox    History of measles    Hyperlipidemia    Hypertension    PAF (paroxysmal atrial fibrillation) [facility])     Past Surgical History:  Procedure Laterality Date   SKIN CANCER EXCISION  07/2020    Current Outpatient Medications  Medication Sig Dispense Refill   amLODipine (NORVASC) 5 MG tablet TAKE 1 TABLET BY MOUTH DAILY 90 tablet 1   atorvastatin (LIPITOR) 40 MG tablet TAKE ONE TABLET EVERY DAY 90 tablet 4   diclofenac sodium (VOLTAREN) 1 % GEL Apply 2 g  topically 4 (four) times daily. 100 g 2   ELIQUIS 5 MG TABS tablet TAKE ONE (1) TABLET BY MOUTH TWO TIMES PER DAY 180 tablet 1   metoprolol succinate (TOPROL-XL) 25 MG 24 hr tablet TAKE 1 TABLET BY MOUTH DAILY WITH OR IMMEDIATELY FOLLOWING A MEAL. 90 tablet 1   No current facility-administered medications for this visit.    No Known Allergies  Social History   Socioeconomic History   Marital status: Married    Spouse name: [place]   Number of children: 2   Years of education: Not on file   Highest education level: 12th grade  Occupational History   Occupation: Retired   Occupation: Retired [employer]  Tobacco Use   Smoking status: Former    Packs/day: 1.00    Years: 20.00    Total pack years: 20.00    Types: Cigarettes    Quit date: 03/12/1983    Years since quitting: 38.5   Smokeless tobacco: Never  Vaping Use   Vaping Use: Never used  Substance and Sexual Activity   Alcohol use: No   Drug use: No   Sexual activity: Not on file  Other Topics Concern   Not on file  Social History Narrative   Not on file   Social Determinants of Health   Financial Resource Strain: Low Risk  (08/25/2019)   Overall Financial Resource Strain (CARDIA)    Difficulty of Paying Living Expenses: Not hard at all  Food Insecurity: No  Food Insecurity (08/25/2019)   Hunger Vital Sign    Worried About Running Out of Food in the Last Year: Never true    Ran Out of Food in the Last Year: Never true  Transportation Needs: No Transportation Needs (08/25/2019)   PRAPARE - Hydrologist (Medical): No    Lack of Transportation (Non-Medical): No  Physical Activity: Inactive (08/25/2019)   Exercise Vital Sign    Days of Exercise per Week: 0 days    Minutes of Exercise per Session: 0 min  Stress: No Stress Concern Present (08/25/2019)   [place]    Feeling of Stress : Not at all  Social Connections:  Moderately Integrated (08/25/2019)   Social Connection and Isolation Panel [NHANES]    Frequency of Communication with Friends and Family: Three times a week    Frequency of Social Gatherings with Friends and Family: More than three times a week    Attends Religious Services: More than 4 times per year    Active Member of [employer] or Organizations: No    Attends Archivist Meetings: Never    Marital Status: Married  Human resources officer Violence: Not At Risk (08/25/2019)   Humiliation, Afraid, Rape, and Kick questionnaire    Fear of Current or Ex-Partner: No    Emotionally Abused: No    Physically Abused: No    Sexually Abused: No    Family History  Problem Relation Age of Onset   Arrhythmia Mother        A-Fib   Lung cancer Father    Diabetes Son    Colon cancer Neg Hx    Prostate cancer Neg Hx     Review of Systems:  As stated in the HPI and otherwise negative.   BP 128/70   Pulse 72   Ht _0  (1.651 m)   Wt 180 lb (81.6 kg)   SpO2 96%   BMI 29.95 kg/m   Physical Examination: General: Well developed, well nourished, NAD  HEENT: OP clear, mucus membranes moist  SKIN: warm, dry. No rashes. Neuro: No focal deficits  Musculoskeletal: Muscle strength 5/5 all ext  Psychiatric: Mood and affect normal  Neck: No JVD, no carotid bruits, no thyromegaly, no lymphadenopathy.  Lungs:Clear bilaterally, no wheezes, rhonci, crackles Cardiovascular: Regular rate and rhythm. Loud, harsh, late peaking systolic murmur.  Abdomen:Soft. Bowel sounds present. Non-tender.  Extremities: No lower extremity edema. Pulses are 2 + in the bilateral DP/PT.  EKG:  EKG is not ordered today. The ekg from 08/22/21 shows sinus   Echo 08/16/21:  1. Left ventricular ejection fraction, by estimation, is 60 to 65%. The  left ventricle has normal function. The left ventricle has no regional  wall motion abnormalities. There is mild left ventricular hypertrophy.  Left ventricular diastolic parameters   are consistent with Grade I diastolic dysfunction (impaired relaxation).   2. Right ventricular systolic function is normal. The right ventricular  size is normal.   3. The mitral valve is normal in structure. Mild mitral valve  regurgitation. No evidence of mitral stenosis. Moderate mitral annular  calcification.   4. The aortic valve is normal in structure. There is moderate  calcification of the aortic valve. Aortic valve regurgitation is not  visualized. Moderate aortic valve stenosis by Aortic valve mean gradient  measures 18.5 mmHg. Aortic valve Vmax measures  2.80 m/s. Severe valve stenosis by estimated AVA 0.72 cm sq   5. The  inferior vena cava is normal in size with greater than 50%  respiratory variability, suggesting right atrial pressure of 3 mmHg.   Comparison(s): EF 55%, mild AS mean 12.67mHg, peak 21.210mg, mild-mod MR,  mod MAC.   FINDINGS   Left Ventricle: Left ventricular ejection fraction, by estimation, is 60  to 65%. The left ventricle has normal function. The left ventricle has no  regional wall motion abnormalities. The left ventricular internal cavity  size was normal in size. There is   mild left ventricular hypertrophy. Left ventricular diastolic parameters  are consistent with Grade I diastolic dysfunction (impaired relaxation).   Right Ventricle: The right ventricular size is normal. No increase in  right ventricular wall thickness. Right ventricular systolic function is  normal.   Left Atrium: Left atrial size was normal in size.   Right Atrium: Right atrial size was normal in size.   Pericardium: There is no evidence of pericardial effusion.   Mitral Valve: The mitral valve is normal in structure. Moderate mitral  annular calcification. Mild mitral valve regurgitation. No evidence of  mitral valve stenosis.   Tricuspid Valve: The tricuspid valve is normal in structure. Tricuspid  valve regurgitation is not demonstrated. No evidence of tricuspid   stenosis.   Aortic Valve: The aortic valve is normal in structure. There is moderate  calcification of the aortic valve. Aortic valve regurgitation is not  visualized. Moderate aortic stenosis is present. Aortic valve mean  gradient measures 18.5 mmHg. Aortic valve  peak gradient measures 31.4 mmHg. Aortic valve area, by VTI measures 0.72  cm.   Pulmonic Valve: The pulmonic valve was normal in structure. Pulmonic valve  regurgitation is trivial. No evidence of pulmonic stenosis.   Aorta: The aortic root is normal in size and structure.   Venous: The inferior vena cava is normal in size with greater than 50%  respiratory variability, suggesting right atrial pressure of 3 mmHg.   IAS/Shunts: No atrial level shunt detected by color flow Doppler.      LEFT VENTRICLE  PLAX 2D  LVIDd:         4.80 cm     Diastology  LVIDs:         2.90 cm     LV e' medial:    4.61 cm/s  LV PW:         0.90 cm     LV E/e' medial:  21.0  LV IVS:        1.10 cm     LV e' lateral:   6.92 cm/s  LVOT diam:     1.90 cm     LV E/e' lateral: 14.0  LV SV:         48  LV SV Index:   25          2D Longitudinal Strain  LVOT Area:     2.84 cm    2D Strain GLS Avg:     -16.0 %     LV Volumes (MOD)  LV vol d, MOD A2C: 61.8 ml 3D Volume EF:  LV vol d, MOD A4C: 92.1 ml 3D EF:        55 %  LV vol s, MOD A2C: 24.4 ml LV EDV:       98 ml  LV vol s, MOD A4C: 28.7 ml LV ESV:       44 ml  LV SV MOD A2C:     37.4 ml LV SV:  54 ml  LV SV MOD A4C:     92.1 ml  LV SV MOD BP:      54.2 ml   RIGHT VENTRICLE  RV S prime:     19.60 cm/s  TAPSE (M-mode): 2.9 cm   LEFT ATRIUM             Index        RIGHT ATRIUM           Index  LA diam:        4.00 cm 2.10 cm/m   RA Area:     13.70 cm  LA Vol (A2C):   74.6 ml 39.16 ml/m  RA Volume:   26.60 ml  13.96 ml/m  LA Vol (A4C):   65.7 ml 34.49 ml/m  LA Biplane Vol: 71.1 ml 37.33 ml/m   AORTIC VALVE                     PULMONIC VALVE  AV Area (Vmax):    0.79 cm       PV Vmax:          1.06 m/s  AV Area (Vmean):   0.70 cm      PV Peak grad:     4.5 mmHg  AV Area (VTI):     0.72 cm      PR End Diast Vel: 1.98 msec  AV Vmax:           280.00 cm/s  AV Vmean:          205.000 cm/s  AV VTI:            0.666 m  AV Peak Grad:      31.4 mmHg  AV Mean Grad:      18.5 mmHg  LVOT Vmax:         78.30 cm/s  LVOT Vmean:        50.900 cm/s  LVOT VTI:          0.168 m  LVOT/AV VTI ratio: 0.25     AORTA  Ao Root diam: 3.40 cm  Ao Asc diam:  3.20 cm  Ao Arch diam: 2.5 cm   MITRAL VALVE                TRICUSPID VALVE  MV Area (PHT): 3.05 cm     TR Peak grad:   8.6 mmHg  MV Decel Time: 249 msec     TR Vmax:        147.00 cm/s  MV E velocity: 97.00 cm/s  MV A velocity: 102.00 cm/s  SHUNTS  MV E/A ratio:  0.95         Systemic VTI:  0.17 m                              Systemic Diam: 1.90 cm   Recent Labs: 10/03/2020: ALT 15 09/06/2021: BUN 15; Creatinine, Ser 1.00; Hemoglobin 14.2; Platelets 263; Potassium 4.8; Sodium 139    Wt Readings from Last 3 Encounters:  09/06/21 180 lb (81.6 kg)  09/05/21 180 lb 9.6 oz (81.9 kg)  08/22/21 181 lb 12.8 oz (82.5 kg)    Assessment and Plan:   1. Severe Aortic Valve Stenosis: He has severe, stage D2 low flow/low gradient aortic valve stenosis. NYHA class 1. I have personally reviewed the echo images. The aortic valve is thickened, calcified with limited leaflet mobility. I think he would  benefit from AVR. Given advanced age, he is not a good candidate for conventional AVR by surgical approach. I think he may be a good candidate for TAVR.   I have reviewed the natural history of aortic stenosis with the Jon Mejia and their family members  who are present today. We have discussed the limitations of medical therapy and the poor prognosis associated with symptomatic aortic stenosis. We have reviewed potential treatment options, including palliative medical therapy, conventional surgical aortic valve replacement, and transcatheter  aortic valve replacement. We discussed treatment options in the context of the Jon Mejia's specific comorbid medical conditions.    He would like to proceed with planning for TAVR. I will arrange a right and left heart catheterization at [facility] 09/17/21. Risks and benefits of the cath procedure and the valve procedure are reviewed with the Jon Mejia. After the cath, he will have a cardiac CT, CTA of the chest/abdomen and pelvis and will then be referred to see one of the CT surgeons on our TAVR team.     Labs/ tests ordered today include:   Orders Placed This Encounter  Procedures   CBC   Basic metabolic panel   Disposition:   F/U with the valve team.   Signed, Lauree Chandler, MD 09/07/2021 7:26 AM    [address], [place], [place]  [address] Phone: [phone]; Fax: [phone]

## 2021-09-05 NOTE — Progress Notes (Signed)
[facility] Consult Note  Chief Complaint  Patient presents with   New Patient (Initial Visit)    Aortic stenosis   History of Present Illness: 81 yo male with history of prior CVA, atrial fibrillation, PACs, former tobacco abuse, HTN, hyperlipidemia and aortic stenosis who is here today as a new consult, referred by Dr. Rockey Situ, for further discussion regarding his aortic stenosis and possible TAVR. He has paroxysmal atrial fibrillation and is on Eliquis. He had a stroke in 2020. Report in the records of normal cardiac cath in 2019 but I cannot see this report. He has recently been having dizziness. Echo 08/16/21 with LVEF=60-65%. Mild LVH. Normal RV size and function. The aortic valve is calcified and thickened with mean gradient 18.5 mmHg, peak gradient 31.4 mmHg, AVA 0.7 cm2, DI 0.25, SVI 25. This is consistent with low flow/low gradient severe aortic stenosis.   He tells me today that he has progressive dyspnea on exertion, fatigue. He has some dizziness but no near syncope. He has no chest pain. He has no lower extremity edema. He lives in [place], [place] with his wife. He is retired from [employer]. He has partial dentures on the top. No active dental issues.   Primary Care Physician: [name], MD Primary Cardiologist: Rockey Situ Referring Cardiologist: Rockey Situ  Past Medical History:  Diagnosis Date   Aortic stenosis    CVA (cerebral vascular accident) ([place]) 03/20/2018   History of chicken pox    History of measles    Hyperlipidemia    Hypertension    PAF (paroxysmal atrial fibrillation) [facility])     Past Surgical History:  Procedure Laterality Date   SKIN CANCER EXCISION  07/2020    Current Outpatient Medications  Medication Sig Dispense Refill   amLODipine (NORVASC) 5 MG tablet TAKE 1 TABLET BY MOUTH DAILY 90 tablet 1   atorvastatin (LIPITOR) 40 MG tablet TAKE ONE TABLET EVERY DAY 90 tablet 4   diclofenac sodium (VOLTAREN) 1 % GEL Apply 2 g  topically 4 (four) times daily. 100 g 2   ELIQUIS 5 MG TABS tablet TAKE ONE (1) TABLET BY MOUTH TWO TIMES PER DAY 180 tablet 1   metoprolol succinate (TOPROL-XL) 25 MG 24 hr tablet TAKE 1 TABLET BY MOUTH DAILY WITH OR IMMEDIATELY FOLLOWING A MEAL. 90 tablet 1   No current facility-administered medications for this visit.    No Known Allergies  Social History   Socioeconomic History   Marital status: Married    Spouse name: [place]   Number of children: 2   Years of education: Not on file   Highest education level: 12th grade  Occupational History   Occupation: Retired   Occupation: Retired [employer]  Tobacco Use   Smoking status: Former    Packs/day: 1.00    Years: 20.00    Total pack years: 20.00    Types: Cigarettes    Quit date: 03/12/1983    Years since quitting: 38.5   Smokeless tobacco: Never  Vaping Use   Vaping Use: Never used  Substance and Sexual Activity   Alcohol use: No   Drug use: No   Sexual activity: Not on file  Other Topics Concern   Not on file  Social History Narrative   Not on file   Social Determinants of Health   Financial Resource Strain: Low Risk  (08/25/2019)   Overall Financial Resource Strain (CARDIA)    Difficulty of Paying Living Expenses: Not hard at all  Food Insecurity: No  Food Insecurity (08/25/2019)   Hunger Vital Sign    Worried About Running Out of Food in the Last Year: Never true    Ran Out of Food in the Last Year: Never true  Transportation Needs: No Transportation Needs (08/25/2019)   PRAPARE - Hydrologist (Medical): No    Lack of Transportation (Non-Medical): No  Physical Activity: Inactive (08/25/2019)   Exercise Vital Sign    Days of Exercise per Week: 0 days    Minutes of Exercise per Session: 0 min  Stress: No Stress Concern Present (08/25/2019)   [address]    Feeling of Stress : Not at all  Social Connections:  Moderately Integrated (08/25/2019)   Social Connection and Isolation Panel [NHANES]    Frequency of Communication with Friends and Family: Three times a week    Frequency of Social Gatherings with Friends and Family: More than three times a week    Attends Religious Services: More than 4 times per year    Active Member of [employer] or Organizations: No    Attends Archivist Meetings: Never    Marital Status: Married  Human resources officer Violence: Not At Risk (08/25/2019)   Humiliation, Afraid, Rape, and Kick questionnaire    Fear of Current or Ex-Partner: No    Emotionally Abused: No    Physically Abused: No    Sexually Abused: No    Family History  Problem Relation Age of Onset   Arrhythmia Mother        A-Fib   Lung cancer Father    Diabetes Son    Colon cancer Neg Hx    Prostate cancer Neg Hx     Review of Systems:  As stated in the HPI and otherwise negative.   BP 128/70   Pulse 72   Ht _0  (1.651 m)   Wt 180 lb (81.6 kg)   SpO2 96%   BMI 29.95 kg/m   Physical Examination: General: Well developed, well nourished, NAD  HEENT: OP clear, mucus membranes moist  SKIN: warm, dry. No rashes. Neuro: No focal deficits  Musculoskeletal: Muscle strength 5/5 all ext  Psychiatric: Mood and affect normal  Neck: No JVD, no carotid bruits, no thyromegaly, no lymphadenopathy.  Lungs:Clear bilaterally, no wheezes, rhonci, crackles Cardiovascular: Regular rate and rhythm. Loud, harsh, late peaking systolic murmur.  Abdomen:Soft. Bowel sounds present. Non-tender.  Extremities: No lower extremity edema. Pulses are 2 + in the bilateral DP/PT.  EKG:  EKG is not ordered today. The ekg from 08/22/21 shows sinus   Echo 08/16/21:  1. Left ventricular ejection fraction, by estimation, is 60 to 65%. The  left ventricle has normal function. The left ventricle has no regional  wall motion abnormalities. There is mild left ventricular hypertrophy.  Left ventricular diastolic parameters   are consistent with Grade I diastolic dysfunction (impaired relaxation).   2. Right ventricular systolic function is normal. The right ventricular  size is normal.   3. The mitral valve is normal in structure. Mild mitral valve  regurgitation. No evidence of mitral stenosis. Moderate mitral annular  calcification.   4. The aortic valve is normal in structure. There is moderate  calcification of the aortic valve. Aortic valve regurgitation is not  visualized. Moderate aortic valve stenosis by Aortic valve mean gradient  measures 18.5 mmHg. Aortic valve Vmax measures  2.80 m/s. Severe valve stenosis by estimated AVA 0.72 cm sq   5. The  inferior vena cava is normal in size with greater than 50%  respiratory variability, suggesting right atrial pressure of 3 mmHg.   Comparison(s): EF 55%, mild AS mean 12.83mHg, peak 21.274mg, mild-mod MR,  mod MAC.   FINDINGS   Left Ventricle: Left ventricular ejection fraction, by estimation, is 60  to 65%. The left ventricle has normal function. The left ventricle has no  regional wall motion abnormalities. The left ventricular internal cavity  size was normal in size. There is   mild left ventricular hypertrophy. Left ventricular diastolic parameters  are consistent with Grade I diastolic dysfunction (impaired relaxation).   Right Ventricle: The right ventricular size is normal. No increase in  right ventricular wall thickness. Right ventricular systolic function is  normal.   Left Atrium: Left atrial size was normal in size.   Right Atrium: Right atrial size was normal in size.   Pericardium: There is no evidence of pericardial effusion.   Mitral Valve: The mitral valve is normal in structure. Moderate mitral  annular calcification. Mild mitral valve regurgitation. No evidence of  mitral valve stenosis.   Tricuspid Valve: The tricuspid valve is normal in structure. Tricuspid  valve regurgitation is not demonstrated. No evidence of tricuspid   stenosis.   Aortic Valve: The aortic valve is normal in structure. There is moderate  calcification of the aortic valve. Aortic valve regurgitation is not  visualized. Moderate aortic stenosis is present. Aortic valve mean  gradient measures 18.5 mmHg. Aortic valve  peak gradient measures 31.4 mmHg. Aortic valve area, by VTI measures 0.72  cm.   Pulmonic Valve: The pulmonic valve was normal in structure. Pulmonic valve  regurgitation is trivial. No evidence of pulmonic stenosis.   Aorta: The aortic root is normal in size and structure.   Venous: The inferior vena cava is normal in size with greater than 50%  respiratory variability, suggesting right atrial pressure of 3 mmHg.   IAS/Shunts: No atrial level shunt detected by color flow Doppler.      LEFT VENTRICLE  PLAX 2D  LVIDd:         4.80 cm     Diastology  LVIDs:         2.90 cm     LV e' medial:    4.61 cm/s  LV PW:         0.90 cm     LV E/e' medial:  21.0  LV IVS:        1.10 cm     LV e' lateral:   6.92 cm/s  LVOT diam:     1.90 cm     LV E/e' lateral: 14.0  LV SV:         48  LV SV Index:   25          2D Longitudinal Strain  LVOT Area:     2.84 cm    2D Strain GLS Avg:     -16.0 %     LV Volumes (MOD)  LV vol d, MOD A2C: 61.8 ml 3D Volume EF:  LV vol d, MOD A4C: 92.1 ml 3D EF:        55 %  LV vol s, MOD A2C: 24.4 ml LV EDV:       98 ml  LV vol s, MOD A4C: 28.7 ml LV ESV:       44 ml  LV SV MOD A2C:     37.4 ml LV SV:  54 ml  LV SV MOD A4C:     92.1 ml  LV SV MOD BP:      54.2 ml   RIGHT VENTRICLE  RV S prime:     19.60 cm/s  TAPSE (M-mode): 2.9 cm   LEFT ATRIUM             Index        RIGHT ATRIUM           Index  LA diam:        4.00 cm 2.10 cm/m   RA Area:     13.70 cm  LA Vol (A2C):   74.6 ml 39.16 ml/m  RA Volume:   26.60 ml  13.96 ml/m  LA Vol (A4C):   65.7 ml 34.49 ml/m  LA Biplane Vol: 71.1 ml 37.33 ml/m   AORTIC VALVE                     PULMONIC VALVE  AV Area (Vmax):    0.79 cm       PV Vmax:          1.06 m/s  AV Area (Vmean):   0.70 cm      PV Peak grad:     4.5 mmHg  AV Area (VTI):     0.72 cm      PR End Diast Vel: 1.98 msec  AV Vmax:           280.00 cm/s  AV Vmean:          205.000 cm/s  AV VTI:            0.666 m  AV Peak Grad:      31.4 mmHg  AV Mean Grad:      18.5 mmHg  LVOT Vmax:         78.30 cm/s  LVOT Vmean:        50.900 cm/s  LVOT VTI:          0.168 m  LVOT/AV VTI ratio: 0.25     AORTA  Ao Root diam: 3.40 cm  Ao Asc diam:  3.20 cm  Ao Arch diam: 2.5 cm   MITRAL VALVE                TRICUSPID VALVE  MV Area (PHT): 3.05 cm     TR Peak grad:   8.6 mmHg  MV Decel Time: 249 msec     TR Vmax:        147.00 cm/s  MV E velocity: 97.00 cm/s  MV A velocity: 102.00 cm/s  SHUNTS  MV E/A ratio:  0.95         Systemic VTI:  0.17 m                              Systemic Diam: 1.90 cm   Recent Labs: 10/03/2020: ALT 15 09/06/2021: BUN 15; Creatinine, Ser 1.00; Hemoglobin 14.2; Platelets 263; Potassium 4.8; Sodium 139    Wt Readings from Last 3 Encounters:  09/06/21 180 lb (81.6 kg)  09/05/21 180 lb 9.6 oz (81.9 kg)  08/22/21 181 lb 12.8 oz (82.5 kg)    Assessment and Plan:   1. Severe Aortic Valve Stenosis: He has severe, stage D2 low flow/low gradient aortic valve stenosis. NYHA class 1. I have personally reviewed the echo images. The aortic valve is thickened, calcified with limited leaflet mobility. I think he would  benefit from AVR. Given advanced age, he is not a good candidate for conventional AVR by surgical approach. I think he may be a good candidate for TAVR.   I have reviewed the natural history of aortic stenosis with the patient and their family members  who are present today. We have discussed the limitations of medical therapy and the poor prognosis associated with symptomatic aortic stenosis. We have reviewed potential treatment options, including palliative medical therapy, conventional surgical aortic valve replacement, and transcatheter  aortic valve replacement. We discussed treatment options in the context of the patient's specific comorbid medical conditions.    He would like to proceed with planning for TAVR. I will arrange a right and left heart catheterization at [facility] 09/17/21. Risks and benefits of the cath procedure and the valve procedure are reviewed with the patient. After the cath, he will have a cardiac CT, CTA of the chest/abdomen and pelvis and will then be referred to see one of the CT surgeons on our TAVR team.     Labs/ tests ordered today include:   Orders Placed This Encounter  Procedures   CBC   Basic metabolic panel   Disposition:   F/U with the valve team.   Signed, Lauree Chandler, MD 09/07/2021 7:26 AM    [address], [place], [place]  [id number] Phone: [phone]; Fax: [phone]

## 2021-09-06 ENCOUNTER — Encounter: Payer: Self-pay | Admitting: Cardiovascular Disease

## 2021-09-06 ENCOUNTER — Ambulatory Visit: Payer: PPO | Admitting: Cardiovascular Disease

## 2021-09-06 VITALS — BP 128/70 | HR 72 | Ht 65.0 in | Wt 180.0 lb

## 2021-09-06 DIAGNOSIS — Z01812 Encounter for preprocedural laboratory examination: Secondary | ICD-10-CM

## 2021-09-06 DIAGNOSIS — I350 Nonrheumatic aortic (valve) stenosis: Secondary | ICD-10-CM

## 2021-09-06 LAB — BASIC METABOLIC PANEL
BUN/Creatinine Ratio: 15 (ref 10–24)
BUN: 15 mg/dL (ref 8–27)
CO2: 24 mmol/L (ref 20–29)
Calcium: 9.9 mg/dL (ref 8.6–10.2)
Chloride: 105 mmol/L (ref 96–106)
Creatinine, Ser: 1 mg/dL (ref 0.76–1.27)
Glucose: 107 mg/dL — ABNORMAL HIGH (ref 70–99)
Potassium: 4.8 mmol/L (ref 3.5–5.2)
Sodium: 139 mmol/L (ref 134–144)
eGFR: 77 mL/min/{1.73_m2} (ref >59)

## 2021-09-06 LAB — CBC
Hematocrit: 42.1 % (ref 37.5–51.0)
Hemoglobin: 14.2 g/dL (ref 13.0–17.7)
MCH: 29.8 pg (ref 26.6–33.0)
MCHC: 33.7 g/dL (ref 31.5–35.7)
MCV: 88 fL (ref 79–97)
Platelets: 263 10*3/uL (ref 150–450)
RBC: 4.77 x10E6/uL (ref 4.14–5.80)
RDW: 14.9 % (ref 11.6–15.4)
WBC: 7.3 10*3/uL (ref 3.4–10.8)

## 2021-09-06 LAB — LIPID PANEL
Chol/HDL Ratio: 3.8 ratio (ref 0.0–5.0)
Cholesterol, Total: 148 mg/dL (ref 100–199)
HDL: 39 mg/dL — ABNORMAL LOW (ref >39)
LDL Chol Calc (NIH): 82 mg/dL (ref 0–99)
Triglycerides: 154 mg/dL — ABNORMAL HIGH (ref 0–149)
VLDL Cholesterol Cal: 27 mg/dL (ref 5–40)

## 2021-09-06 LAB — HEMOGLOBIN A1C
Est. average glucose Bld gHb Est-mCnc: 128 mg/dL
Hgb A1c MFr Bld: 6.1 % — ABNORMAL HIGH (ref 4.8–5.6)

## 2021-09-06 LAB — SEDIMENTATION RATE: Sed Rate: 23 mm/hr (ref 0–30)

## 2021-09-06 NOTE — Progress Notes (Addendum)
Pre Surgical Assessment: 5 M Walk Test  9M=16.30f  5 Meter Walk Test- trial 1: 5.17 seconds 5 Meter Walk Test- trial 2: 5.37 seconds 5 Meter Walk Test- trial 3: 5.04 seconds 5 Meter Walk Test Average: 5.19 seconds   Procedure Type: Isolated AVR Perioperative Outcome Estimate % Operative Mortality 2.03% Morbidity & Mortality 8.6% Stroke 2.14% Renal Failure 1.54% Reoperation 3.35% Prolonged Ventilation 3.91% Deep Sternal Wound Infection 0.083% [place] Hospital[facility] (>14 days) 3.34% Short Hospital Stay (<6 days)* 38.9%

## 2021-09-06 NOTE — Patient Instructions (Signed)
Medication Instructions:  NO CHANGES *If you need a refill on your cardiac medications before your next appointment, please call your pharmacy*   Lab Work: TODAY: BMET, CBC   Testing/Procedures: Your physician has requested that you have a cardiac catheterization. Cardiac catheterization is used to diagnose and/or treat various heart conditions. Doctors may recommend this procedure for a number of different reasons. The most common reason is to evaluate chest pain. Chest pain can be a symptom of coronary artery disease (CAD), and cardiac catheterization can show whether plaque is narrowing or blocking your heart's arteries. This procedure is also used to evaluate the valves, as well as measure the blood flow and oxygen levels in different parts of your heart. For further information please visit [email or web address]. Please follow instruction sheet, as given.   Follow-Up: Per Structural Heart Valve Team   Other Instructions  [facility] [place], [place]  [place] [address] Dept: [phone] Loc: [place]  09/06/2021  You are scheduled for a Cardiac Catheterization on Monday, August 21 with Dr. Lauree Chandler.  1. Please arrive at the Main Entrance A at [facility]: [place], [place] [address] at 7:00 AM (This time is two hours before your procedure to ensure your preparation). Free valet parking service is available.   Special note: Every effort is made to have your procedure done on time. Please understand that emergencies sometimes delay scheduled procedures.  2. Diet: Do not eat solid foods after midnight.  You may have clear liquids until 5 AM upon the day of the procedure.  3. Labs: You will need to have blood drawn today.You do not need to be fasting.  4. Medication instructions in preparation for your procedure:   Contrast  Allergy: No  DO NOT TAKE ELIQUIS AFTER FRIDAY EVENING DOSE (09/14/21)  On the morning of your procedure, take Aspirin and any morning medicines NOT listed above.  You may use sips of water.  5. Plan to go home the same day, you will only stay overnight if medically necessary. 6. You MUST have a responsible adult to drive you home. 7. An adult MUST be with you the first 24 hours after you arrive home. 8. Bring a current list of your medications, and the last time and date medication taken. 9. Bring ID and current insurance cards. 10.Please wear clothes that are easy to get on and off and wear slip-on shoes.  Thank you for allowing [place] to care for you!   -- [facility]

## 2021-09-09 ENCOUNTER — Telehealth: Payer: Self-pay | Admitting: Neurology

## 2021-09-09 NOTE — Telephone Encounter (Signed)
Healthteam advantage NPR sent to GI

## 2021-09-09 NOTE — Progress Notes (Signed)
Kindly inform the patient that screening test for diabetes and ESR was satisfactory.  Cholesterol profile is mostly satisfactory except triglycerides are marginally elevated.  Nothing to worry about.  Talk to primary care physician about diet and treatment for elevated triglycerides

## 2021-09-10 ENCOUNTER — Telehealth: Payer: Self-pay | Admitting: *Deleted

## 2021-09-10 NOTE — Telephone Encounter (Signed)
I spoke with the patient's wife, Jon Mejia (as per [facility]). I informed her of the results. The patient will follow up with Dr. Caryn Section on 10/05/2021 for his annual physical and his triglycerides will be addressed during the visit.  Jon Mejia stated the patient has seen a cardiologist and is scheduled for a heart catheterization on Monday. He will also need a valve replacement at a future date.  She inquired about the necessity of the patient's MRI's due to scans that were recommended by cardiology. I encouraged her to continue with ordered scans, she was agreeable.  She verbalized understanding and appreciation of the call.

## 2021-09-10 NOTE — Telephone Encounter (Signed)
-----  Message from Garvin Fila, MD sent at 09/09/2021  4:16 PM EDT ----- Mitchell Heir inform the patient that screening test for diabetes and ESR was satisfactory.  Cholesterol profile is mostly satisfactory except triglycerides are marginally elevated.  Nothing to worry about.  Talk to primary care physician about diet and treatment for elevated triglycerides

## 2021-09-13 ENCOUNTER — Telehealth: Payer: Self-pay | Admitting: *Deleted

## 2021-09-13 NOTE — Telephone Encounter (Signed)
Cardiac Catheterization scheduled at [facility] for: Monday September 17, 2021 9 AM Arrival time and place: [place] Entrance A at: 7 AM   Nothing to eat after midnight prior to procedure, clear liquids until 5 AM day of procedure.  Medication instructions: -Hold:  Eliquis-none 09/15/21 until post procedure -Except hold medications usual morning medications can be taken with sips of water including aspirin 81 mg.  Confirmed patient has responsible adult to drive home post procedure and be with patient first 24 hours after arriving home.  Patient reports no new symptoms concerning for COVID-19 in the past 10 days.  Reviewed procedure instructions with patient.

## 2021-09-17 ENCOUNTER — Other Ambulatory Visit: Payer: Self-pay

## 2021-09-17 ENCOUNTER — Ambulatory Visit (HOSPITAL_COMMUNITY)
Admission: RE | Admit: 2021-09-17 | Discharge: 2021-09-17 | Disposition: A | Discharge status: Home / Self Care | Payer: PPO | Attending: Cardiovascular Disease | Admitting: Cardiovascular Disease

## 2021-09-17 ENCOUNTER — Encounter (HOSPITAL_COMMUNITY): Payer: Self-pay | Admitting: Cardiovascular Disease

## 2021-09-17 ENCOUNTER — Encounter (HOSPITAL_COMMUNITY)
Admission: RE | Disposition: A | Discharge status: Home / Self Care | Payer: Self-pay | Source: Home / Self Care | Attending: Cardiovascular Disease

## 2021-09-17 DIAGNOSIS — I2510 Atherosclerotic heart disease of native coronary artery without angina pectoris: Secondary | ICD-10-CM | POA: Diagnosis not present

## 2021-09-17 DIAGNOSIS — I350 Nonrheumatic aortic (valve) stenosis: Secondary | ICD-10-CM | POA: Diagnosis not present

## 2021-09-17 DIAGNOSIS — E785 Hyperlipidemia, unspecified: Secondary | ICD-10-CM | POA: Diagnosis not present

## 2021-09-17 DIAGNOSIS — Z7901 Long term (current) use of anticoagulants: Secondary | ICD-10-CM | POA: Insufficient documentation

## 2021-09-17 DIAGNOSIS — Z87891 Personal history of nicotine dependence: Secondary | ICD-10-CM | POA: Diagnosis not present

## 2021-09-17 DIAGNOSIS — Z8673 Personal history of transient ischemic attack (TIA), and cerebral infarction without residual deficits: Secondary | ICD-10-CM | POA: Insufficient documentation

## 2021-09-17 DIAGNOSIS — I10 Essential (primary) hypertension: Secondary | ICD-10-CM | POA: Diagnosis not present

## 2021-09-17 DIAGNOSIS — I480 Paroxysmal atrial fibrillation: Secondary | ICD-10-CM | POA: Insufficient documentation

## 2021-09-17 HISTORY — DX: Atherosclerotic heart disease of native coronary artery without angina pectoris: I25.10

## 2021-09-17 HISTORY — PX: RIGHT/LEFT HEART CATH AND CORONARY ANGIOGRAPHY: CATH118266

## 2021-09-17 LAB — POCT I-STAT EG7
Acid-base deficit: 2 mmol/L (ref 0.0–2.0)
Bicarbonate: 23.8 mmol/L (ref 20.0–28.0)
Calcium, Ion: 1.27 mmol/L (ref 1.15–1.40)
HCT: 37 % — ABNORMAL LOW (ref 39.0–52.0)
Hemoglobin: 12.6 g/dL — ABNORMAL LOW (ref 13.0–17.0)
O2 Saturation: 74 %
Potassium: 4.3 mmol/L (ref 3.5–5.1)
Sodium: 142 mmol/L (ref 135–145)
TCO2: 25 mmol/L (ref 22–32)
pCO2, Ven: 45.8 mmHg (ref 44–60)
pH, Ven: 7.324 (ref 7.25–7.43)
pO2, Ven: 43 mmHg (ref 32–45)

## 2021-09-17 LAB — POCT I-STAT 7, (LYTES, BLD GAS, ICA,H+H)
Acid-base deficit: 3 mmol/L — ABNORMAL HIGH (ref 0.0–2.0)
Bicarbonate: 22.6 mmol/L (ref 20.0–28.0)
Calcium, Ion: 1.2 mmol/L (ref 1.15–1.40)
HCT: 36 % — ABNORMAL LOW (ref 39.0–52.0)
Hemoglobin: 12.2 g/dL — ABNORMAL LOW (ref 13.0–17.0)
O2 Saturation: 99 %
Potassium: 4.1 mmol/L (ref 3.5–5.1)
Sodium: 142 mmol/L (ref 135–145)
TCO2: 24 mmol/L (ref 22–32)
pCO2 arterial: 41.3 mmHg (ref 32–48)
pH, Arterial: 7.345 — ABNORMAL LOW (ref 7.35–7.45)
pO2, Arterial: 123 mmHg — ABNORMAL HIGH (ref 83–108)

## 2021-09-17 SURGERY — RIGHT/LEFT HEART CATH AND CORONARY ANGIOGRAPHY
Anesthesia: LOCAL

## 2021-09-17 MED ORDER — SODIUM CHLORIDE 0.9 % IV SOLN: INTRAVENOUS | Status: DC

## 2021-09-17 MED ORDER — FENTANYL CITRATE (PF) 100 MCG/2ML IJ SOLN
INTRAMUSCULAR | Status: DC | PRN
  Administered 2021-09-17: 25 ug via INTRAVENOUS

## 2021-09-17 MED ORDER — HEPARIN SODIUM (PORCINE) 1000 UNIT/ML IJ SOLN
INTRAMUSCULAR | Status: DC | PRN
  Administered 2021-09-17: 4500 [IU] via INTRAVENOUS

## 2021-09-17 MED ORDER — MIDAZOLAM HCL 2 MG/2ML IJ SOLN
INTRAMUSCULAR | Status: DC | PRN
  Administered 2021-09-17: 1 mg via INTRAVENOUS

## 2021-09-17 MED ORDER — VERAPAMIL HCL 2.5 MG/ML IV SOLN
INTRAVENOUS | Status: AC
  Filled 2021-09-17: qty 2

## 2021-09-17 MED ORDER — SODIUM CHLORIDE 0.9% FLUSH: 3.0000 mL | Freq: Two times a day (BID) | INTRAVENOUS | Status: DC

## 2021-09-17 MED ORDER — ASPIRIN 81 MG PO CHEW: 81.0000 mg | CHEWABLE_TABLET | ORAL | Status: DC

## 2021-09-17 MED ORDER — HYDRALAZINE HCL 20 MG/ML IJ SOLN: 10.0000 mg | INTRAMUSCULAR | Status: DC | PRN

## 2021-09-17 MED ORDER — IOHEXOL 350 MG/ML SOLN
INTRAVENOUS | Status: DC | PRN
  Administered 2021-09-17: 45 mL

## 2021-09-17 MED ORDER — SODIUM CHLORIDE 0.9 % IV SOLN: 250.0000 mL | INTRAVENOUS | Status: DC | PRN

## 2021-09-17 MED ORDER — ASPIRIN 81 MG PO CHEW
81.0000 mg | CHEWABLE_TABLET | ORAL | Status: AC
  Administered 2021-09-17: 81 mg via ORAL
  Filled 2021-09-17: qty 1

## 2021-09-17 MED ORDER — SODIUM CHLORIDE 0.9% FLUSH
3.0000 mL | INTRAVENOUS | Status: DC | PRN
Start: 2021-09-17 — End: 2021-09-17

## 2021-09-17 MED ORDER — ACETAMINOPHEN 325 MG PO TABS: 650.0000 mg | ORAL_TABLET | ORAL | Status: DC | PRN

## 2021-09-17 MED ORDER — HEPARIN SODIUM (PORCINE) 1000 UNIT/ML IJ SOLN
INTRAMUSCULAR | Status: AC
  Filled 2021-09-17: qty 10

## 2021-09-17 MED ORDER — ONDANSETRON HCL 4 MG/2ML IJ SOLN: 4.0000 mg | Freq: Four times a day (QID) | INTRAMUSCULAR | Status: DC | PRN

## 2021-09-17 MED ORDER — SODIUM CHLORIDE 0.9% FLUSH: 3.0000 mL | INTRAVENOUS | Status: DC | PRN

## 2021-09-17 MED ORDER — LABETALOL HCL 5 MG/ML IV SOLN: 10.0000 mg | INTRAVENOUS | Status: DC | PRN

## 2021-09-17 MED ORDER — HEPARIN (PORCINE) IN NACL 1000-0.9 UT/500ML-% IV SOLN
INTRAVENOUS | Status: AC
Start: 2021-09-17 — End: ?
  Filled 2021-09-17: qty 1000

## 2021-09-17 MED ORDER — SODIUM CHLORIDE 0.9 % WEIGHT BASED INFUSION
3.0000 mL/kg/h | INTRAVENOUS | Status: AC
  Administered 2021-09-17: 3 mL/kg/h via INTRAVENOUS

## 2021-09-17 MED ORDER — HEPARIN (PORCINE) IN NACL 1000-0.9 UT/500ML-% IV SOLN
INTRAVENOUS | Status: DC | PRN
  Administered 2021-09-17 (×2): 500 mL

## 2021-09-17 MED ORDER — MIDAZOLAM HCL 2 MG/2ML IJ SOLN
INTRAMUSCULAR | Status: AC
  Filled 2021-09-17: qty 2

## 2021-09-17 MED ORDER — FENTANYL CITRATE (PF) 100 MCG/2ML IJ SOLN
INTRAMUSCULAR | Status: AC
  Filled 2021-09-17: qty 2

## 2021-09-17 MED ORDER — LIDOCAINE HCL (PF) 1 % IJ SOLN
INTRAMUSCULAR | Status: AC
Start: 2021-09-17 — End: ?
  Filled 2021-09-17: qty 30

## 2021-09-17 MED ORDER — VERAPAMIL HCL 2.5 MG/ML IV SOLN
INTRAVENOUS | Status: DC | PRN
  Administered 2021-09-17: 10 mL via INTRA_ARTERIAL

## 2021-09-17 MED ORDER — SODIUM CHLORIDE 0.9 % WEIGHT BASED INFUSION: 1.0000 mL/kg/h | INTRAVENOUS | Status: DC

## 2021-09-17 MED ORDER — LIDOCAINE HCL (PF) 1 % IJ SOLN
INTRAMUSCULAR | Status: DC | PRN
  Administered 2021-09-17 (×2): 2 mL

## 2021-09-17 SURGICAL SUPPLY — 14 items
BAND ZEPHYR COMPRESS 30 LONG (HEMOSTASIS) IMPLANT
CATH 5FR JL3.5 JR4 ANG PIG MP (CATHETERS) IMPLANT
CATH BALLN WEDGE 5F 110CM (CATHETERS) IMPLANT
DEVICE RAD COMP TR BAND LRG (VASCULAR PRODUCTS) IMPLANT
GLIDESHEATH SLEND SS 6F .021 (SHEATH) IMPLANT
GUIDEWIRE .025 260CM (WIRE) IMPLANT
GUIDEWIRE INQWIRE 1.5J.035X260 (WIRE) IMPLANT
INQWIRE 1.5J .035X260CM (WIRE) ×1
KIT HEART LEFT (KITS) ×1 IMPLANT
PACK CARDIAC CATHETERIZATION (CUSTOM PROCEDURE TRAY) ×1 IMPLANT
SHEATH 6FR 85 DEST SLENDER (SHEATH) IMPLANT
SHEATH GLIDE SLENDER 4/5FR (SHEATH) IMPLANT
TRANSDUCER W/STOPCOCK (MISCELLANEOUS) ×1 IMPLANT
TUBING CIL FLEX 10 FLL-RA (TUBING) ×1 IMPLANT

## 2021-09-17 NOTE — Interval H&P Note (Signed)
History and Physical Interval Note:  09/17/2021 7:25 AM  Jon Mejia  has presented today for surgery, with the diagnosis of aortic stenosis.  The various methods of treatment have been discussed with the patient and family. After consideration of risks, benefits and other options for treatment, the patient has consented to  Procedure(s): RIGHT/LEFT HEART CATH AND CORONARY ANGIOGRAPHY (N/A) as a surgical intervention.  The patient's history has been reviewed, patient examined, no change in status, stable for surgery.  I have reviewed the patient's chart and labs.  Questions were answered to the patient's satisfaction.    Cath Lab Visit (complete for each Cath Lab visit)  Clinical Evaluation Leading to the Procedure:   ACS: No.  Non-ACS:    Anginal Classification: No Symptoms  Anti-ischemic medical therapy: Maximal Therapy (2 or more classes of medications)  Non-Invasive Test Results: No non-invasive testing performed  Prior CABG: No previous CABG        Lauree Chandler

## 2021-09-17 NOTE — Discharge Instructions (Signed)
Resume Eliquis tomorrow if now bleeding from right wrist cath site.

## 2021-09-24 ENCOUNTER — Telehealth: Payer: Self-pay | Admitting: Neurology

## 2021-09-24 MED ORDER — METHOCARBAMOL 500 MG PO TABS: ORAL_TABLET | ORAL | 0 refills | Status: DC

## 2021-09-24 NOTE — Telephone Encounter (Signed)
I called the pt's wife back and advised of work in docs response notified via vm. Advised to CB with questions. If she calls back telephone room staff can update.

## 2021-09-24 NOTE — Telephone Encounter (Signed)
Pt's wife, Goldman Birchall said pt need a muscle relaxer for MRIs scheduled 09/25/21. Pt has to lay flat, he has degenerate back problem. Do not know if he can stay still. Would  like a call from the nurse.

## 2021-09-24 NOTE — Telephone Encounter (Signed)
I sent an rx for robaxin to his pharmacy. He can take it 30 minutes prior to his MRI

## 2021-09-25 ENCOUNTER — Ambulatory Visit
Admission: RE | Admit: 2021-09-25 | Discharge: 2021-09-25 | Disposition: A | Discharge status: Home / Self Care | Payer: PPO | Source: Ambulatory Visit | Attending: Neurology | Admitting: Neurology

## 2021-09-25 DIAGNOSIS — G459 Transient cerebral ischemic attack, unspecified: Secondary | ICD-10-CM | POA: Diagnosis not present

## 2021-09-25 MED ORDER — GADOBENATE DIMEGLUMINE 529 MG/ML IV SOLN
18.0000 mL | Freq: Once | INTRAVENOUS | Status: AC | PRN
  Administered 2021-09-25: 18 mL via INTRAVENOUS

## 2021-09-28 ENCOUNTER — Encounter (HOSPITAL_COMMUNITY): Payer: Self-pay

## 2021-09-28 ENCOUNTER — Ambulatory Visit (HOSPITAL_COMMUNITY)
Admission: RE | Admit: 2021-09-28 | Discharge: 2021-09-28 | Disposition: A | Discharge status: Home / Self Care | Payer: PPO | Source: Ambulatory Visit | Attending: Cardiovascular Disease | Admitting: Cardiovascular Disease

## 2021-09-28 DIAGNOSIS — K449 Diaphragmatic hernia without obstruction or gangrene: Secondary | ICD-10-CM | POA: Diagnosis not present

## 2021-09-28 DIAGNOSIS — I350 Nonrheumatic aortic (valve) stenosis: Secondary | ICD-10-CM | POA: Diagnosis not present

## 2021-09-28 MED ORDER — IOHEXOL 350 MG/ML SOLN
100.0000 mL | Freq: Once | INTRAVENOUS | Status: AC | PRN
Start: 2021-09-28 — End: 2021-09-28
  Administered 2021-09-28: 100 mL via INTRAVENOUS

## 2021-10-04 ENCOUNTER — Other Ambulatory Visit: Payer: Self-pay

## 2021-10-04 DIAGNOSIS — R9389 Abnormal findings on diagnostic imaging of other specified body structures: Secondary | ICD-10-CM

## 2021-10-04 NOTE — Progress Notes (Unsigned)
I,Jon Mejia,acting as a scribe for Jon Huh, MD.,have documented all relevant documentation on the behalf of Jon Huh, MD,as directed by  Jon Huh, MD while in the presence of Jon Huh, MD.   Complete Physical Exam      Patient: Jon Mejia, Male    DOB: November 27, 1940, 81 y.o.   MRN: [id number] Visit Date: 10/05/2021  Today's Provider: [name], MD   Chief Complaint  Patient presents with   Medicare Wellness   Hypertension   Hyperlipidemia   Hyperglycemia   Subjective    DREVION Mejia is a 81 y.o. male who presents today for his complete physical examination. He reports consuming a general diet. The patient does not participate in regular exercise at present. He generally feels fairly well. He reports sleeping fairly well. He does have additional problems to discuss today.   HPI Hypertension, follow-up  BP Readings from Last 3 Encounters:  10/05/21 135/78  09/28/21 (!) 147/79  09/17/21 122/60   Wt Readings from Last 3 Encounters:  10/05/21 181 lb (82.1 kg)  09/17/21 180 lb (81.6 kg)  09/06/21 180 lb (81.6 kg)     He was last seen for hypertension 1  year  ago.  BP at that visit was 148/88. Management since that visit includes advising patient to work on diet and exercise. Continue current medications.  He reports good compliance with treatment. He is not having side effects.  He is following a Regular diet. He is not exercising. He does not smoke.  Use of agents associated with hypertension: none.   Outside blood pressures are 140/78. Symptoms: No chest pain No chest pressure  No palpitations No syncope  No dyspnea No orthopnea  No paroxysmal nocturnal dyspnea No lower extremity edema    ---------------------------------------------------------------------------------------------------   Lipid/Cholesterol, Follow-up  Last lipid panel Other pertinent labs  Lab Results  Component Value Date   CHOL 148 09/05/2021    HDL 39 (L) 09/05/2021   LDLCALC 82 09/05/2021   TRIG 154 (H) 09/05/2021   CHOLHDL 3.8 09/05/2021   Lab Results  Component Value Date   ALT 15 10/03/2020   AST 19 10/03/2020   PLT 263 09/06/2021   TSH 1.890 08/25/2019     He was last seen for this 1  year  ago.  Management since that visit includes continue same medication.  He reports good compliance with treatment. He is not having side effects.   Symptoms: No chest pain No chest pressure/discomfort  No dyspnea No lower extremity edema  No numbness or tingling of extremity No orthopnea  No palpitations No paroxysmal nocturnal dyspnea  No speech difficulty No syncope   Current diet: well balanced Current exercise: none  The ASCVD Risk score (Arnett DK, et al., 2019) failed to calculate for the following reasons:   The patient has a prior MI or stroke diagnosis  ---------------------------------------------------------------------------------------------------   Hyperglycemia, Follow-up  Lab Results  Component Value Date   HGBA1C 6.1 (H) 09/05/2021   HGBA1C 6.1 (H) 10/03/2020   HGBA1C 5.9 (H) 08/25/2019   GLUCOSE 107 (H) 09/06/2021   GLUCOSE 96 10/03/2020   GLUCOSE 103 (H) 08/25/2019    Last seen for for this 1  year  ago.  Management since that visit includes continuing lifestyle modifications. Current symptoms include none and have been stable.  Prior visit with dietician: no Current diet: well balanced Current exercise: none  -----------------------------------------------------------------------------------------    Medications: Outpatient Medications Prior to Visit  Medication Sig  amLODipine (NORVASC) 5 MG tablet TAKE 1 TABLET BY MOUTH DAILY   atorvastatin (LIPITOR) 40 MG tablet TAKE ONE TABLET EVERY DAY   Cyanocobalamin (B-12) 3000 MCG CAPS Take 3,000 mcg by mouth daily as needed (energy).   ELIQUIS 5 MG TABS tablet TAKE ONE (1) TABLET BY MOUTH TWO TIMES PER DAY   metoprolol succinate (TOPROL-XL)  25 MG 24 hr tablet TAKE 1 TABLET BY MOUTH DAILY WITH OR IMMEDIATELY FOLLOWING A MEAL.   naproxen sodium (ALEVE) 220 MG tablet Take 440 mg by mouth daily as needed (pain).   [DISCONTINUED] methocarbamol (ROBAXIN) 500 MG tablet Take one pill 30 minutes prior to MRI   No facility-administered medications prior to visit.    No Known Allergies  Patient Care Team: [name], MD as PCP - General (Family Medicine) [name], [place] as Consulting Physician (Optometry) Rockey Situ, Kathlene November, MD as Consulting Physician (Cardiology)  Review of Systems  Constitutional:  Negative for appetite change, chills, fatigue and fever.  HENT:  Negative for congestion, ear pain, hearing loss, nosebleeds and trouble swallowing.   Eyes:  Negative for pain and visual disturbance.  Respiratory:  Positive for shortness of breath. Negative for cough and chest tightness.   Cardiovascular:  Negative for chest pain, palpitations and leg swelling.  Gastrointestinal:  Negative for abdominal pain, blood in stool, constipation, diarrhea, nausea and vomiting.  Endocrine: Negative for polydipsia, polyphagia and polyuria.  Genitourinary:  Negative for dysuria and flank pain.  Musculoskeletal:  Positive for back pain. Negative for arthralgias, joint swelling, myalgias and neck stiffness.  Skin:  Negative for color change, rash and wound.  Neurological:  Positive for dizziness. Negative for tremors, seizures, speech difficulty, weakness, light-headedness and headaches.  Hematological:  Bruises/bleeds easily.  Psychiatric/Behavioral:  Negative for behavioral problems, confusion, decreased concentration, dysphoric mood and sleep disturbance. The patient is not nervous/anxious.   All other systems reviewed and are negative.   {Labs  Heme  Chem  Endocrine  Serology  Results Review (optional):23779}    Objective    Vitals: BP 135/78 (BP Location: Right Arm, Patient Position: Sitting, Cuff Size: Normal)   Pulse 71    Temp 97.9 F (36.6 C) (Oral)   Resp 16   Ht '5\' 5"'$  (1.651 m)   Wt 181 lb (82.1 kg)   SpO2 98% Comment: room air  BMI 30.12 kg/m  {Show previous vital signs (optional):23777}   Physical Exam  General Appearance:    Obese male. Alert, cooperative, in no acute distress, appears stated age  Head:    Normocephalic, without obvious abnormality, atraumatic  Eyes:    PERRL, conjunctiva/corneas clear, EOM's intact, fundi    benign, both eyes       Ears:    Normal TM's and external ear canals, both ears  Nose:   Nares normal, septum midline, mucosa normal, no drainage   or sinus tenderness  Throat:   Lips, mucosa, and tongue normal; teeth and gums normal  Neck:   Supple, symmetrical, trachea midline, no adenopathy;       thyroid:  No enlargement/tenderness/nodules; no carotid   bruit or JVD  Back:     Symmetric, no curvature, ROM normal, no CVA tenderness  Lungs:     Clear to auscultation bilaterally, respirations unlabored  Chest wall:    No tenderness or deformity  Heart:    Normal heart rate. Normal rhythm.  3/6 harsh, crescendo-decrescendo, systolic murmur at right upper sternal border 1/6 blowing, holosystolic murmur at apex S1 and S2  normal  Abdomen:     Soft, non-tender, bowel sounds active all four quadrants,    no masses, no organomegaly  Genitalia:    deferred  Rectal:    deferred  Extremities:   All extremities are intact. No cyanosis or edema  Pulses:   2+ and symmetric all extremities  Skin:   Skin color, texture, turgor normal, no rashes or lesions  Lymph nodes:   Cervical, supraclavicular, and axillary nodes normal  Neurologic:   CNII-XII intact. Normal strength, sensation and reflexes      throughout     Assessment & Plan      1. Annual physical exam  2. Need for influenza vaccination  - Flu Vaccine QUAD High Dose IM (Fluad)  3. Prostate cancer screening  - PSA Total (Reflex To Free) ([facility] only)  4. Primary hypertension Well controlled. Continue current  medications.    5. Severe aortic stenosis Symptomatic with some shortness of breath. Anticipate TVR per cardiology  6. Pure hypercholesterolemia He is tolerating atorvastatin well with no adverse effects.      The entirety of the information documented in the History of Present Illness, Review of Systems and Physical Exam were personally obtained by me. Portions of this information were initially documented by the CMA and reviewed by me for thoroughness and accuracy.     Jon Huh, MD  [facility] [phone] (phone) [phone] (fax)  [place]

## 2021-10-04 NOTE — Progress Notes (Signed)
Kindly inform the patient that MRI scan study of the brain shows no significant abnormalities.  Only age-appropriate changes.  Nothing to worry about. MR angiogram study of the blood vessels of the neck and the brain also did not show any major blockages to worry about.

## 2021-10-05 ENCOUNTER — Encounter: Payer: Self-pay | Admitting: Family Medicine

## 2021-10-05 ENCOUNTER — Ambulatory Visit (INDEPENDENT_AMBULATORY_CARE_PROVIDER_SITE_OTHER): Payer: PPO | Admitting: Family Medicine

## 2021-10-05 VITALS — BP 135/78 | HR 71 | Temp 97.9°F | Resp 16 | Ht 65.0 in | Wt 181.0 lb

## 2021-10-05 DIAGNOSIS — E7800 Pure hypercholesterolemia, unspecified: Secondary | ICD-10-CM

## 2021-10-05 DIAGNOSIS — Z125 Encounter for screening for malignant neoplasm of prostate: Secondary | ICD-10-CM | POA: Diagnosis not present

## 2021-10-05 DIAGNOSIS — I350 Nonrheumatic aortic (valve) stenosis: Secondary | ICD-10-CM

## 2021-10-05 DIAGNOSIS — I10 Essential (primary) hypertension: Secondary | ICD-10-CM | POA: Diagnosis not present

## 2021-10-05 DIAGNOSIS — K824 Cholesterolosis of gallbladder: Secondary | ICD-10-CM | POA: Diagnosis not present

## 2021-10-05 DIAGNOSIS — Z0000 Encounter for general adult medical examination without abnormal findings: Secondary | ICD-10-CM | POA: Diagnosis not present

## 2021-10-05 DIAGNOSIS — Z23 Encounter for immunization: Secondary | ICD-10-CM

## 2021-10-05 NOTE — Patient Instructions (Addendum)
Please review the attached list of medications and notify my office if there are any errors.   I recommend getting the updated Covid vaccine at your local pharmacy when they come out this month

## 2021-10-05 NOTE — Progress Notes (Unsigned)
Annual Wellness Visit     Patient: Jon Mejia, Male    DOB: [birth year], 81 y.o.   MRN: [id number] Visit Date: 10/05/2021  Today's Provider: [name], MD    Subjective    Jon Mejia is a 81 y.o. male who presents today for his Annual Wellness Visit.  Medications: Outpatient Medications Prior to Visit  Medication Sig   amLODipine (NORVASC) 5 MG tablet TAKE 1 TABLET BY MOUTH DAILY   atorvastatin (LIPITOR) 40 MG tablet TAKE ONE TABLET EVERY DAY   Cyanocobalamin (B-12) 3000 MCG CAPS Take 3,000 mcg by mouth daily as needed (energy).   ELIQUIS 5 MG TABS tablet TAKE ONE (1) TABLET BY MOUTH TWO TIMES PER DAY   metoprolol succinate (TOPROL-XL) 25 MG 24 hr tablet TAKE 1 TABLET BY MOUTH DAILY WITH OR IMMEDIATELY FOLLOWING A MEAL.   naproxen sodium (ALEVE) 220 MG tablet Take 440 mg by mouth daily as needed (pain).   [DISCONTINUED] methocarbamol (ROBAXIN) 500 MG tablet Take one pill 30 minutes prior to MRI   No facility-administered medications prior to visit.    No Known Allergies  Patient Care Team: [name], MD as PCP - General (Family Medicine) [name], [place] as Consulting Physician (Optometry) Rockey Situ, Kathlene November, MD as Consulting Physician (Cardiology)    {Labs  Heme  Chem  Endocrine  Serology  Results Review (optional):23779}    Objective     Most recent functional status assessment:    10/05/2021    9:15 AM  In your present state of health, do you have any difficulty performing the following activities:  Hearing? 0  Vision? 0  Difficulty concentrating or making decisions? 0  Walking or climbing stairs? 0  Dressing or bathing? 0  Doing errands, shopping? 0   Most recent fall risk assessment:    10/05/2021    9:14 AM  [place] in the past year? 0  Number falls in past yr: 0  Injury with Fall? 0  Risk for fall due to : No Fall Risks  Follow up Falls evaluation completed    Most recent depression screenings:     10/05/2021    9:14 AM 10/03/2020    9:14 AM  PHQ 2/9 Scores  PHQ - 2 Score 0 0  PHQ- 9 Score 3 0   Most recent cognitive screening:     No data to display         Most recent Audit-C alcohol use screening    10/05/2021    9:15 AM  Alcohol Use Disorder Test (AUDIT)  1. How often do you have a drink containing alcohol? 0  2. How many drinks containing alcohol do you have on a typical day when you are drinking? 0  3. How often do you have six or more drinks on one occasion? 0  AUDIT-C Score 0   A score of 3 or more in women, and 4 or more in men indicates increased risk for alcohol abuse, EXCEPT if all of the points are from question 1   No results found for any visits on 10/05/21.  Assessment & Plan     Annual wellness visit done today including the all of the following: Reviewed patient's Family Medical History Reviewed and updated list of patient's medical providers Assessment of cognitive impairment was done Assessed patient's functional ability Established a written schedule for health screening [place] Completed and Reviewed  Exercise Activities and Dietary recommendations  Goals      Exercise 3x per week (30 min per time)     Recommend to walk 3 days a week for 30 minutes at a time without stopping.          Immunization History  Administered Date(s) Administered   Fluad Quad(high Dose 65+) 11/27/2018, 11/17/2019, 10/03/2020, 10/05/2021   Influenza, High Dose Seasonal PF 01/17/2014, 01/25/2016, 01/09/2017, 12/05/2017   Influenza,inj,Quad PF,6+ Mos 01/24/2015   PFIZER(Purple Top)SARS-COV-2 Vaccination 02/17/2019, 03/10/2019, 12/29/2019   Pneumococcal Conjugate-13 07/12/2013   Pneumococcal Polysaccharide-23 01/07/2008   Td 12/15/2003   Tdap 08/15/2015   Zoster Recombinat (Shingrix) 10/17/2020    Health Maintenance  Topic Date Due   COVID-19 Vaccine (4 - Pfizer risk series) 02/23/2020   Zoster Vaccines- Shingrix (2 of 2) 12/12/2020    TETANUS/TDAP  08/14/2025   Pneumonia Vaccine 76+ Years old  Completed   INFLUENZA VACCINE  Completed   HPV VACCINES  Aged Out   COLONOSCOPY (Pts 45-11yr Insurance coverage will need to be confirmed)  Discontinued     Discussed health benefits of physical activity, and encouraged him to engage in regular exercise appropriate for his age and condition.        {provider attestation***:1}   DLelon Huh MD  [phone](phone) [phone](fax)  [place]

## 2021-10-06 LAB — PSA TOTAL (REFLEX TO FREE): Prostate Specific Ag, Serum: 2.7 ng/mL (ref 0.0–4.0)

## 2021-10-12 ENCOUNTER — Other Ambulatory Visit: Payer: PPO

## 2021-10-15 ENCOUNTER — Other Ambulatory Visit: Payer: Self-pay | Admitting: Cardiovascular Disease

## 2021-10-16 ENCOUNTER — Ambulatory Visit
Admission: RE | Admit: 2021-10-16 | Discharge: 2021-10-16 | Disposition: A | Discharge status: Home / Self Care | Payer: PPO | Source: Ambulatory Visit | Attending: Cardiovascular Disease | Admitting: Cardiovascular Disease

## 2021-10-16 DIAGNOSIS — R9389 Abnormal findings on diagnostic imaging of other specified body structures: Secondary | ICD-10-CM | POA: Insufficient documentation

## 2021-10-16 DIAGNOSIS — K760 Fatty (change of) liver, not elsewhere classified: Secondary | ICD-10-CM | POA: Diagnosis not present

## 2021-10-21 NOTE — Progress Notes (Signed)
[name]       [place] [id number]             [phone]           Jon Mejia [name] Medical Record #[id number] Date of Birth: [birth year]  [name], MD [name], MD  Chief Complaint:   DOE and fatigue, AS    History of Present Illness:      Pt is a 81 yo wm who has been suffering from progressive DOE and has felt more fatigued. He occasionally gets lightheaded but has no syncope or CP. He has a history of PAF and is on elliquis. He has suffered a CVA in 2020 and had one visual blurry episode but not been felt to represent a new event. 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 He has normal LV function. 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    Past Medical History:  Diagnosis Date   Aortic stenosis    CVA (cerebral vascular accident) ([facility] 03/20/2018   History of chicken pox    History of measles    Hyperlipidemia    Hypertension    PAF ([place]oxysmal [place] fibrillation) ([place]     Past Surgical History:  Procedure Laterality Date   RIGHT/LEFT HEART CATH AND CORONARY ANGIOGRAPHY N/A 09/17/2021   Procedure: RIGHT/LEFT HEART CATH AND CORONARY ANGIOGRAPHY;  Surgeon: MBurnell Blanks MD;  [facility];  Service: Cardiovascular;  Laterality: N/A;   SKIN CANCER EXCISION  07/2020    Social History   Tobacco Use  Smoking Status Former   Packs/day: 1.00   Years: 20.00   Total pack years: 20.00   Types: Cigarettes   Quit date: 03/12/1983   Years since quitting: 38.6  Smokeless Tobacco Never    Social History   Substance and Sexual Activity  Alcohol Use No    Social History   Socioeconomic History   Marital status: Married    Spouse name: MTopawa  Number of children: 2   Years of education: Not on  file   Highest education level: 12th grade  Occupational History   Occupation: Retired   Occupation: Retired [employer] Tobacco Use   Smoking status: Former    Packs/day: 1.00    Years: 20.00    Total pack years: 20.00    Types: Cigarettes    Quit date: 03/12/1983    Years since quitting: 38.6   Smokeless tobacco: Never  Vaping Use   Vaping Use: Never used  Substance and Sexual Activity   Alcohol use: No   Drug use: No   Sexual activity: Not on file  Other Topics Concern   Not on file  Social History Narrative   Not on file   Social Determinants of Health   Financial Resource Strain: Low Risk  (08/25/2019)   Overall Financial Resource Strain (CARDIA)    Difficulty of Paying Living Expenses: Not hard at all  Food Insecurity: No Food Insecurity (08/25/2019)   Hunger Vital Sign    Worried About Running Out of Food in the Last Year: Never true    REllisin the Last Year: Never true  Transportation Needs: No Transportation Needs (08/25/2019)   PRAPARE -  Hydrologist (Medical): No    Lack of Transportation (Non-Medical): No  Physical Activity: Inactive (08/25/2019)   Exercise Vital Sign    Days of Exercise per Week: 0 days    Minutes of Exercise per Session: 0 min  Stress: No Stress Concern Present (08/25/2019)   [place]    Feeling of Stress : Not at all  Social Connections: Moderately Integrated (08/25/2019)   Social Connection and Isolation Panel [NHANES]    Frequency of Communication with Friends and Family: Three times a week    Frequency of Social Gatherings with Friends and Family: More than three times a week    Attends Religious Services: More than 4 times per year    Active Member of [employer] or Organizations: No    Attends Archivist Meetings: Never    Marital Status: Married  Human resources officer Violence: Not At Risk (08/25/2019)   Humiliation,  Afraid, Rape, and Kick questionnaire    Fear of Current or Ex-Partner: No    Emotionally Abused: No    Physically Abused: No    Sexually Abused: No    No Known Allergies  Current Outpatient Medications  Medication Sig Dispense Refill   amLODipine (NORVASC) 5 MG tablet TAKE 1 TABLET BY MOUTH DAILY 90 tablet 1   atorvastatin (LIPITOR) 40 MG tablet TAKE ONE TABLET EVERY DAY 90 tablet 4   Cyanocobalamin (B-12) 3000 MCG CAPS Take 3,000 mcg by mouth daily as needed (energy).     ELIQUIS 5 MG TABS tablet TAKE ONE (1) TABLET BY MOUTH TWO TIMES PER DAY 180 tablet 1   metoprolol succinate (TOPROL-XL) 25 MG 24 hr tablet TAKE 1 TABLET BY MOUTH DAILY WITH OR IMMEDIATELY FOLLOWING A MEAL. 90 tablet 0   naproxen sodium (ALEVE) 220 MG tablet Take 440 mg by mouth daily as needed (pain).     No current facility-administered medications for this visit.     Family History  Problem Relation Age of Onset   Arrhythmia Mother        A-Fib   Lung cancer Father    Diabetes Son    Colon cancer Neg Hx    Prostate cancer Neg Hx        Physical Exam: There were no vitals taken for this visit. HEENT: upper dentures, no issues Lungs: clear Cardiac: RR with soft systolic murmur Ext: no edema   Diagnostic Studies & Laboratory data: I have personally reviewed the following studies and agree with the findings TTE (07/2021) IMPRESSIONS   1. Left ventricular ejection fraction, by estimation, is 60 to 65%. The  left ventricle has normal function. The left ventricle has no regional  wall motion abnormalities. There is mild left ventricular hypertrophy.  Left ventricular diastolic parameters  are consistent with Grade I diastolic dysfunction (impaired relaxation).   2. Right ventricular systolic function is normal. The right ventricular  size is normal.   3. The mitral valve is normal in structure. Mild mitral valve  regurgitation. No evidence of mitral stenosis. Moderate mitral annular  calcification.    4. The aortic valve is normal in structure. There is moderate  calcification of the aortic valve. Aortic valve regurgitation is not  visualized. Moderate aortic valve stenosis by Aortic valve mean gradient  measures 18.5 mmHg. Aortic valve Vmax measures  2.80 m/s. Severe valve stenosis by estimated AVA 0.72 cm sq   5. The inferior vena cava is normal in size  with greater than 50%  respiratory variability, suggesting right atrial pressure of 3 mmHg.   Comparison(s): EF 55%, mild AS mean 12.68mHg, peak 21.226mg, mild-mod MR,  mod MAC.   FINDINGS   Left Ventricle: Left ventricular ejection fraction, by estimation, is 60  to 65%. The left ventricle has normal function. The left ventricle has no  regional wall motion abnormalities. The left ventricular internal cavity  size was normal in size. There is   mild left ventricular hypertrophy. Left ventricular diastolic parameters  are consistent with Grade I diastolic dysfunction (impaired relaxation).   Right Ventricle: The right ventricular size is normal. No increase in  right ventricular wall thickness. Right ventricular systolic function is  normal.   Left Atrium: Left atrial size was normal in size.   Right Atrium: Right atrial size was normal in size.   Pericardium: There is no evidence of pericardial effusion.   Mitral Valve: The mitral valve is normal in structure. Moderate mitral  annular calcification. Mild mitral valve regurgitation. No evidence of  mitral valve stenosis.   Tricuspid Valve: The tricuspid valve is normal in structure. Tricuspid  valve regurgitation is not demonstrated. No evidence of tricuspid  stenosis.   Aortic Valve: The aortic valve is normal in structure. There is moderate  calcification of the aortic valve. Aortic valve regurgitation is not  visualized. Moderate aortic stenosis is present. Aortic valve mean  gradient measures 18.5 mmHg. Aortic valve  peak gradient measures 31.4 mmHg. Aortic valve  area, by VTI measures 0.72  cm.   Pulmonic Valve: The pulmonic valve was normal in structure. Pulmonic valve  regurgitation is trivial. No evidence of pulmonic stenosis.   Aorta: The aortic root is normal in size and structure.   Venous: The inferior vena cava is normal in size with greater than 50%  respiratory variability, suggesting right atrial pressure of 3 mmHg.   IAS/Shunts: No atrial level shunt detected by color flow Doppler.      LEFT VENTRICLE  PLAX 2D  LVIDd:         4.80 cm     Diastology  LVIDs:         2.90 cm     LV e' medial:    4.61 cm/s  LV PW:         0.90 cm     LV E/e' medial:  21.0  LV IVS:        1.10 cm     LV e' lateral:   6.92 cm/s  LVOT diam:     1.90 cm     LV E/e' lateral: 14.0  LV SV:         48  LV SV Index:   25          2D Longitudinal Strain  LVOT Area:     2.84 cm    2D Strain GLS Avg:     -16.0 %     LV Volumes (MOD)  LV vol d, MOD A2C: 61.8 ml 3D Volume EF:  LV vol d, MOD A4C: 92.1 ml 3D EF:        55 %  LV vol s, MOD A2C: 24.4 ml LV EDV:       98 ml  LV vol s, MOD A4C: 28.7 ml LV ESV:       44 ml  LV SV MOD A2C:     37.4 ml LV SV:        54 ml  LV SV MOD A4C:  92.1 ml  LV SV MOD BP:      54.2 ml   RIGHT VENTRICLE  RV S prime:     19.60 cm/s  TAPSE (M-mode): 2.9 cm   LEFT ATRIUM             Index        RIGHT ATRIUM           Index  LA diam:        4.00 cm 2.10 cm/m   RA Area:     13.70 cm  LA Vol (A2C):   74.6 ml 39.16 ml/m  RA Volume:   26.60 ml  13.96 ml/m  LA Vol (A4C):   65.7 ml 34.49 ml/m  LA Biplane Vol: 71.1 ml 37.33 ml/m   AORTIC VALVE                     PULMONIC VALVE  AV Area (Vmax):    0.79 cm      PV Vmax:          1.06 m/s  AV Area (Vmean):   0.70 cm      PV Peak grad:     4.5 mmHg  AV Area (VTI):     0.72 cm      PR End Diast Vel: 1.98 msec  AV Vmax:           280.00 cm/s  AV Vmean:          205.000 cm/s  AV VTI:            0.666 m  AV Peak Grad:      31.4 mmHg  AV Mean Grad:      18.5 mmHg  LVOT  Vmax:         78.30 cm/s  LVOT Vmean:        50.900 cm/s  LVOT VTI:          0.168 m  LVOT/AV VTI ratio: 0.25     AORTA  Ao Root diam: 3.40 cm  Ao Asc diam:  3.20 cm  Ao Arch diam: 2.5 cm   MITRAL VALVE                TRICUSPID VALVE  MV Area (PHT): 3.05 cm     TR Peak grad:   8.6 mmHg  MV Decel Time: 249 msec     TR Vmax:        147.00 cm/s  MV E velocity: 97.00 cm/s  MV A velocity: 102.00 cm/s  SHUNTS  MV E/A ratio:  0.95         Systemic VTI:  0.17 m                              Systemic Diam: 1.90 cm     Cath (08/2021) Conclusion      Prox RCA lesion is 30% stenosed.   Dist RCA lesion is 40% stenosed.   Prox Cx lesion is 50% stenosed.   Dist Cx lesion is 30% stenosed.   Prox LAD lesion is 50% stenosed.   Mid LAD lesion is 50% stenosed.   Dist LAD lesion is 50% stenosed.   Moderate non-obstructive disease in the LAD, Circumflex and RCA Severe low flow/low gradient aortic stenosis by echo Normal right and left heart pressures. (PCWP 11, LVEDP 12-15)   Recommendations: Continue workup for TAVR. Medical management of non-obstructive CAD.   TAVR CTA (  09/2021) FINDINGS: Aortic Valve: Tri leaflet AV calcium score only 1263   Aorta: No aneurysm, normal arch vessels moderate calcified atherosclerosis   Sinotubular Junction: 26 mm   Ascending Thoracic Aorta: 32 mm   Aortic Arch: 27 mm   Descending Thoracic Aorta: 26 mm   Sinus of Valsalva Measurements:   Non-coronary: 32.1 mm   Right - coronary: 31 mm height 17.2 mm   Left - coronary: 31.3 mm  height 19.3 mm   Coronary Artery Height above Annulus:   Left Main: 13.3 mm above annulus   Right Coronary: 11.9 mm above annulus   Virtual Basal Annulus Measurements:   Maximum/Minimum Diameter: 26.6 mm x 22.4 mm   Perimeter: 76.1   Area: 439.5   Coronary Arteries: Sufficient height above annulus for deployment   Optimum Fluoroscopic Angle for Delivery: LAO 14 Caudal 14 degrees   IMPRESSION: 1.  Calcified  tri leaflet AV with score only 1263   2. Annular area 439.5 suitable for lower limit 26 Sapien May be better suited for a 29 Medtronic Evolut Pro   3.  Coronary arteries suitable height above annulus for deployment   4. Optimum angiographic angle for deployment LAO 14 Caudal 14 degrees   5.  Membranous septal length 7.4 mm   Recent Lab Findings: Lab Results  Component Value Date   WBC 7.3 09/06/2021   HGB 12.6 (L) 09/17/2021   HGB 12.2 (L) 09/17/2021   HCT 37.0 (L) 09/17/2021   HCT 36.0 (L) 09/17/2021   PLT 263 09/06/2021   GLUCOSE 107 (H) 09/06/2021   CHOL 148 09/05/2021   TRIG 154 (H) 09/05/2021   HDL 39 (L) 09/05/2021   LDLCALC 82 09/05/2021   ALT 15 10/03/2020   AST 19 10/03/2020   NA 142 09/17/2021   NA 142 09/17/2021   K 4.3 09/17/2021   K 4.1 09/17/2021   CL 105 09/06/2021   CREATININE 1.00 09/06/2021   BUN 15 09/06/2021   CO2 24 09/06/2021   TSH 1.890 08/25/2019   INR 1.03 03/13/2018   HGBA1C 6.1 (H) 09/05/2021      Assessment / Plan:    Severe Aortic Valve Stenosis  Pt with class I-2 NYHA symptoms and evidence of severe AS by echo which appears to be from low flow low gradient variety  His echo has evidence of thickened and poor mobility leaflets and should have improvement with an AVR. Pt with his age and multiple comorbidities would best be served with TAVR and  the risks and goals of this procedure were discussed with pt and family and they understand and wish to proceed.  The patient would have surgical bail out if need arises at time of TAVR I have spent 60 min in reviewing chart and viewing studies and in face to face with pt in discussions of issues and the procedure and in coordination of further care.  Paroxysmal Afib  Will continue NOAC following procedure History of CVA  Pt understands that has increase risk of CVA with TAVR secondary to his history       _0 @ 10/21/2021 4:56 PM

## 2021-10-22 ENCOUNTER — Institutional Professional Consult (permissible substitution): Payer: PPO | Admitting: Thoracic Surgery (Cardiothoracic Vascular Surgery)

## 2021-10-22 ENCOUNTER — Encounter: Payer: Self-pay | Admitting: Thoracic Surgery (Cardiothoracic Vascular Surgery)

## 2021-10-22 VITALS — BP 160/89 | HR 66 | Resp 18 | Ht 65.0 in | Wt 181.0 lb

## 2021-10-22 DIAGNOSIS — I350 Nonrheumatic aortic (valve) stenosis: Secondary | ICD-10-CM | POA: Diagnosis not present

## 2021-10-22 NOTE — Patient Instructions (Signed)
Will be scheduled for TAVR the next few weeks

## 2021-10-25 ENCOUNTER — Other Ambulatory Visit: Payer: Self-pay | Admitting: Cardiovascular Disease

## 2021-10-26 ENCOUNTER — Other Ambulatory Visit: Payer: Self-pay

## 2021-10-26 DIAGNOSIS — I350 Nonrheumatic aortic (valve) stenosis: Secondary | ICD-10-CM

## 2021-11-02 ENCOUNTER — Encounter (HOSPITAL_COMMUNITY)
Admission: RE | Admit: 2021-11-02 | Discharge: 2021-11-02 | Disposition: A | Discharge status: Home / Self Care | Payer: PPO | Source: Ambulatory Visit | Attending: Cardiovascular Disease | Admitting: Cardiovascular Disease

## 2021-11-02 ENCOUNTER — Ambulatory Visit (HOSPITAL_COMMUNITY)
Admission: RE | Admit: 2021-11-02 | Discharge: 2021-11-02 | Disposition: A | Discharge status: Home / Self Care | Payer: PPO | Source: Ambulatory Visit | Attending: Cardiovascular Disease | Admitting: Cardiovascular Disease

## 2021-11-02 VITALS — BP 144/77 | HR 57 | Temp 97.9°F | Resp 17 | Ht 65.0 in | Wt 182.0 lb

## 2021-11-02 DIAGNOSIS — Z1152 Encounter for screening for COVID-19: Secondary | ICD-10-CM | POA: Insufficient documentation

## 2021-11-02 DIAGNOSIS — Z01818 Encounter for other preprocedural examination: Secondary | ICD-10-CM | POA: Insufficient documentation

## 2021-11-02 DIAGNOSIS — I350 Nonrheumatic aortic (valve) stenosis: Secondary | ICD-10-CM | POA: Diagnosis not present

## 2021-11-02 LAB — URINALYSIS, ROUTINE W REFLEX MICROSCOPIC
Bilirubin Urine: NEGATIVE
Glucose, UA: NEGATIVE mg/dL
Hgb urine dipstick: NEGATIVE
Ketones, ur: NEGATIVE mg/dL
Leukocytes,Ua: NEGATIVE
Nitrite: NEGATIVE
Protein, ur: NEGATIVE mg/dL
Specific Gravity, Urine: 1.017 (ref 1.005–1.030)
pH: 5 (ref 5.0–8.0)

## 2021-11-02 LAB — COMPREHENSIVE METABOLIC PANEL
ALT: 16 U/L (ref 0–44)
AST: 21 U/L (ref 15–41)
Albumin: 4.1 g/dL (ref 3.5–5.0)
Alkaline Phosphatase: 71 U/L (ref 38–126)
Anion gap: 8 (ref 5–15)
BUN: 11 mg/dL (ref 8–23)
CO2: 25 mmol/L (ref 22–32)
Calcium: 9.5 mg/dL (ref 8.9–10.3)
Chloride: 106 mmol/L (ref 98–111)
Creatinine, Ser: 1.03 mg/dL (ref 0.61–1.24)
GFR, Estimated: >60 mL/min (ref >60)
Glucose, Bld: 103 mg/dL — ABNORMAL HIGH (ref 70–99)
Potassium: 4 mmol/L (ref 3.5–5.1)
Sodium: 139 mmol/L (ref 135–145)
Total Bilirubin: 1.7 mg/dL — ABNORMAL HIGH (ref 0.3–1.2)
Total Protein: 7.4 g/dL (ref 6.5–8.1)

## 2021-11-02 LAB — TYPE AND SCREEN
ABO/RH(D): O POS
Antibody Screen: NEGATIVE

## 2021-11-02 LAB — CBC
HCT: 41.2 % (ref 39.0–52.0)
Hemoglobin: 14 g/dL (ref 13.0–17.0)
MCH: 30.4 pg (ref 26.0–34.0)
MCHC: 34 g/dL (ref 30.0–36.0)
MCV: 89.4 fL (ref 80.0–100.0)
Platelets: 255 10*3/uL (ref 150–400)
RBC: 4.61 MIL/uL (ref 4.22–5.81)
RDW: 14.1 % (ref 11.5–15.5)
WBC: 8.3 10*3/uL (ref 4.0–10.5)
nRBC: 0 % (ref 0.0–0.2)

## 2021-11-02 LAB — SURGICAL PCR SCREEN
MRSA, PCR: NEGATIVE
Staphylococcus aureus: POSITIVE — AB

## 2021-11-02 LAB — PROTIME-INR
INR: 1 (ref 0.8–1.2)
Prothrombin Time: 13.5 seconds (ref 11.4–15.2)

## 2021-11-02 NOTE — Progress Notes (Signed)
Pt received pre-surgical CHG soap at lab appointment. Instructions given on how to use and all questions answered.

## 2021-11-03 LAB — SARS CORONAVIRUS 2 (TAT 6-24 HRS): SARS Coronavirus 2: NEGATIVE

## 2021-11-05 ENCOUNTER — Other Ambulatory Visit: Payer: Self-pay | Admitting: Cardiovascular Disease

## 2021-11-05 MED ORDER — CEFAZOLIN SODIUM-DEXTROSE 2-4 GM/100ML-% IV SOLN
2.0000 g | INTRAVENOUS | Status: AC
  Administered 2021-11-06: 2 g via INTRAVENOUS
  Filled 2021-11-05 (×2): qty 100

## 2021-11-05 MED ORDER — POTASSIUM CHLORIDE 2 MEQ/ML IV SOLN
80.0000 meq | INTRAVENOUS | Status: DC
  Filled 2021-11-05: qty 40

## 2021-11-05 MED ORDER — MAGNESIUM SULFATE 50 % IJ SOLN
40.0000 meq | INTRAMUSCULAR | Status: DC
  Filled 2021-11-05: qty 9.85

## 2021-11-05 MED ORDER — HEPARIN 30,000 UNITS/1000 ML (OHS) CELLSAVER SOLUTION
Status: DC
  Filled 2021-11-05: qty 1000

## 2021-11-05 MED ORDER — NOREPINEPHRINE 4 MG/250ML-% IV SOLN
0.0000 ug/min | INTRAVENOUS | Status: AC
  Administered 2021-11-06: 2 ug/min via INTRAVENOUS
  Filled 2021-11-05: qty 250

## 2021-11-05 MED ORDER — DEXMEDETOMIDINE HCL IN NACL 400 MCG/100ML IV SOLN
0.1000 ug/kg/h | INTRAVENOUS | Status: AC
  Administered 2021-11-06: 82.6 ug via INTRAVENOUS
  Filled 2021-11-05: qty 100

## 2021-11-05 NOTE — Anesthesia Preprocedure Evaluation (Signed)
Anesthesia Evaluation  Patient identified by MRN, date of birth, ID band Patient awake    Reviewed: Allergy & Precautions, H&P , NPO status , Patient's Chart, lab work & pertinent test results  Airway Mallampati: II  TM Distance: >3 FB Neck ROM: Full    Dental no notable dental hx. (+) Edentulous Upper, Dental Advisory Given   Pulmonary neg pulmonary ROS, former smoker,    Pulmonary exam normal breath sounds clear to auscultation       Cardiovascular Exercise Tolerance: Good hypertension, Pt. on medications and Pt. on home beta blockers + dysrhythmias Atrial Fibrillation + Valvular Problems/Murmurs AS  Rhythm:Regular Rate:Normal + Systolic murmurs    Neuro/Psych CVA, No Residual Symptoms negative psych ROS   GI/Hepatic negative GI ROS, Neg liver ROS,   Endo/Other  negative endocrine ROS  Renal/GU negative Renal ROS  negative genitourinary   Musculoskeletal   Abdominal   Peds  Hematology negative hematology ROS (+)   Anesthesia Other Findings   Reproductive/Obstetrics negative OB ROS                            Anesthesia Physical Anesthesia Plan  ASA: 4  Anesthesia Plan: MAC   Post-op Pain Management: Tylenol PO (pre-op)* and Precedex   Induction: Intravenous  PONV Risk Score and Plan: 2 and Propofol infusion and Ondansetron  Airway Management Planned: Natural Airway and Simple Face Mask  Additional Equipment: Arterial line  Intra-op Plan:   Post-operative Plan:   Informed Consent: I have reviewed the patients History and Physical, chart, labs and discussed the procedure including the risks, benefits and alternatives for the proposed anesthesia with the patient or authorized representative who has indicated his/her understanding and acceptance.     Dental advisory given  Plan Discussed with: CRNA  Anesthesia Plan Comments:        Anesthesia Quick Evaluation

## 2021-11-05 NOTE — H&P (Signed)
Expand All Collapse All                                                                                                                                                                                                                                       [address]       [place] [address]             [phone]                                   Jon Mejia Olney Medical Record #[id number] Date of Birth: Jun 17, 1940   [name], MD [name], MD   Chief Complaint:   DOE and fatigue, AS       History of Present Illness:       Pt is a 81 yo wm who has been suffering from progressive DOE and has felt more fatigued. He occasionally gets lightheaded but has no syncope or CP. He has a history of PAF and is on elliquis. He has suffered a CVA in 2020 and had one visual blurry episode but not been felt to represent a new event. 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 He has normal LV function. 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         Past Medical History:  Diagnosis Date   Aortic stenosis     CVA (cerebral vascular accident) ([facility] 03/20/2018   History of chicken pox     History of measles     Hyperlipidemia     Hypertension     PAF (paroxysmal atrial fibrillation) ([facility]             Past Surgical History:  Procedure Laterality Date   RIGHT/LEFT HEART CATH AND CORONARY ANGIOGRAPHY N/A 09/17/2021    Procedure: RIGHT/LEFT HEART CATH AND CORONARY ANGIOGRAPHY;  Surgeon: MBurnell Blanks MD;  Location: [facility];  Service: Cardiovascular;  Laterality: N/A;   SKIN CANCER EXCISION   07/2020      Social History        Tobacco Use  Smoking Status Former    Packs/day: 1.00   Years: 20.00   Total pack years: 20.00   Types: Cigarettes   Quit date: 03/12/1983   Years since quitting: 38.6  Smokeless Tobacco Never    Social History       Substance and Sexual Activity  Alcohol Use No      Social History         Socioeconomic History   Marital status: Married      Spouse name: [place]   Number of children: 2   Years of education: Not on file   Highest education level: 12th grade  Occupational History   Occupation: Retired   Occupation: Retired [employer]  Tobacco Use   Smoking status: Former      Packs/day: 1.00      Years: 20.00      Total pack years: 20.00      Types: Cigarettes      Quit date: 03/12/1983      Years since quitting: 38.6   Smokeless tobacco: Never  Vaping Use   Vaping Use: Never used  Substance and Sexual Activity   Alcohol use: No   Drug use: No   Sexual activity: Not on file  Other Topics Concern   Not on file  Social History Narrative   Not on file    Social Determinants of Health        Financial Resource Strain: Low Risk  (08/25/2019)    Overall Financial Resource Strain (CARDIA)     Difficulty of Paying Living Expenses: Not hard at all  Food Insecurity: No Food Insecurity (08/25/2019)    Hunger Vital Sign     Worried About Running Out of Food in the Last Year: Never true     [place] in the Last Year: Never true  Transportation Needs: No Transportation Needs (08/25/2019)    PRAPARE - Armed forces logistics/support/administrative officer (Medical): No     Lack of Transportation (Non-Medical): No  Physical Activity: Inactive (08/25/2019)    Exercise Vital Sign     Days of Exercise per Week: 0 days     Minutes of Exercise per Session: 0 min  Stress: No Stress Concern Present (08/25/2019)    [place]     Feeling of Stress : Not at all  Social Connections: Moderately Integrated (08/25/2019)    Social Connection and Isolation  Panel [NHANES]     Frequency of Communication with Friends and Family: Three times a week     Frequency of Social Gatherings with Friends and Family: More than three times a week     Attends Religious Services: More than 4 times per year     Active Member of [employer] or Organizations: No     Attends Archivist Meetings: Never     Marital Status: Married  Human resources officer Violence: Not At Risk (08/25/2019)    Humiliation, Afraid, Rape, and Kick questionnaire     Fear of Current or Ex-Partner: No     Emotionally Abused: No     Physically Abused: No     Sexually Abused: No      No Known Allergies         Current Outpatient Medications  Medication Sig Dispense Refill   amLODipine (NORVASC) 5 MG tablet TAKE 1 TABLET BY MOUTH DAILY 90 tablet 1   atorvastatin (LIPITOR) 40 MG tablet TAKE ONE TABLET EVERY DAY 90 tablet  4   Cyanocobalamin (B-12) 3000 MCG CAPS Take 3,000 mcg by mouth daily as needed (energy).       ELIQUIS 5 MG TABS tablet TAKE ONE (1) TABLET BY MOUTH TWO TIMES PER DAY 180 tablet 1   metoprolol succinate (TOPROL-XL) 25 MG 24 hr tablet TAKE 1 TABLET BY MOUTH DAILY WITH OR IMMEDIATELY FOLLOWING A MEAL. 90 tablet 0   naproxen sodium (ALEVE) 220 MG tablet Take 440 mg by mouth daily as needed (pain).        No current facility-administered medications for this visit.             Family History  Problem Relation Age of Onset   Arrhythmia Mother          A-Fib   Lung cancer Father     Diabetes Son     Colon cancer Neg Hx     Prostate cancer Neg Hx              Physical Exam: There were no vitals taken for this visit. HEENT: upper dentures, no issues Lungs: clear Cardiac: RR with soft systolic murmur Ext: no edema     Diagnostic Studies & Laboratory data: I have personally reviewed the following studies and agree with the findings TTE (07/2021) IMPRESSIONS   1. Left ventricular ejection fraction, by estimation, is 60 to 65%. The  left ventricle has normal  function. The left ventricle has no regional  wall motion abnormalities. There is mild left ventricular hypertrophy.  Left ventricular diastolic parameters  are consistent with Grade I diastolic dysfunction (impaired relaxation).   2. Right ventricular systolic function is normal. The right ventricular  size is normal.   3. The mitral valve is normal in structure. Mild mitral valve  regurgitation. No evidence of mitral stenosis. Moderate mitral annular  calcification.   4. The aortic valve is normal in structure. There is moderate  calcification of the aortic valve. Aortic valve regurgitation is not  visualized. Moderate aortic valve stenosis by Aortic valve mean gradient  measures 18.5 mmHg. Aortic valve Vmax measures  2.80 m/s. Severe valve stenosis by estimated AVA 0.72 cm sq   5. The inferior vena cava is normal in size with greater than 50%  respiratory variability, suggesting right atrial pressure of 3 mmHg.   Comparison(s): EF 55%, mild AS mean 12.34mHg, peak 21.26mg, mild-mod MR,  mod MAC.   FINDINGS   Left Ventricle: Left ventricular ejection fraction, by estimation, is 60  to 65%. The left ventricle has normal function. The left ventricle has no  regional wall motion abnormalities. The left ventricular internal cavity  size was normal in size. There is   mild left ventricular hypertrophy. Left ventricular diastolic parameters  are consistent with Grade I diastolic dysfunction (impaired relaxation).   Right Ventricle: The right ventricular size is normal. No increase in  right ventricular wall thickness. Right ventricular systolic function is  normal.   Left Atrium: Left atrial size was normal in size.   Right Atrium: Right atrial size was normal in size.   Pericardium: There is no evidence of pericardial effusion.   Mitral Valve: The mitral valve is normal in structure. Moderate mitral  annular calcification. Mild mitral valve regurgitation. No evidence of  mitral  valve stenosis.   Tricuspid Valve: The tricuspid valve is normal in structure. Tricuspid  valve regurgitation is not demonstrated. No evidence of tricuspid  stenosis.   Aortic Valve: The aortic valve is normal in structure. There is moderate  calcification of the aortic valve. Aortic valve regurgitation is not  visualized. Moderate aortic stenosis is present. Aortic valve mean  gradient measures 18.5 mmHg. Aortic valve  peak gradient measures 31.4 mmHg. Aortic valve area, by VTI measures 0.72  cm.   Pulmonic Valve: The pulmonic valve was normal in structure. Pulmonic valve  regurgitation is trivial. No evidence of pulmonic stenosis.   Aorta: The aortic root is normal in size and structure.   Venous: The inferior vena cava is normal in size with greater than 50%  respiratory variability, suggesting right atrial pressure of 3 mmHg.   IAS/Shunts: No atrial level shunt detected by color flow Doppler.      LEFT VENTRICLE  PLAX 2D  LVIDd:         4.80 cm     Diastology  LVIDs:         2.90 cm     LV e' medial:    4.61 cm/s  LV PW:         0.90 cm     LV E/e' medial:  21.0  LV IVS:        1.10 cm     LV e' lateral:   6.92 cm/s  LVOT diam:     1.90 cm     LV E/e' lateral: 14.0  LV SV:         48  LV SV Index:   25          2D Longitudinal Strain  LVOT Area:     2.84 cm    2D Strain GLS Avg:     -16.0 %     LV Volumes (MOD)  LV vol d, MOD A2C: 61.8 ml 3D Volume EF:  LV vol d, MOD A4C: 92.1 ml 3D EF:        55 %  LV vol s, MOD A2C: 24.4 ml LV EDV:       98 ml  LV vol s, MOD A4C: 28.7 ml LV ESV:       44 ml  LV SV MOD A2C:     37.4 ml LV SV:        54 ml  LV SV MOD A4C:     92.1 ml  LV SV MOD BP:      54.2 ml   RIGHT VENTRICLE  RV S prime:     19.60 cm/s  TAPSE (M-mode): 2.9 cm   LEFT ATRIUM             Index        RIGHT ATRIUM           Index  LA diam:        4.00 cm 2.10 cm/m   RA Area:     13.70 cm  LA Vol (A2C):   74.6 ml 39.16 ml/m  RA Volume:   26.60 ml  13.96 ml/m   LA Vol (A4C):   65.7 ml 34.49 ml/m  LA Biplane Vol: 71.1 ml 37.33 ml/m   AORTIC VALVE                     PULMONIC VALVE  AV Area (Vmax):    0.79 cm      PV Vmax:          1.06 m/s  AV Area (Vmean):   0.70 cm      PV Peak grad:     4.5 mmHg  AV Area (VTI):     0.72  cm      PR End Diast Vel: 1.98 msec  AV Vmax:           280.00 cm/s  AV Vmean:          205.000 cm/s  AV VTI:            0.666 m  AV Peak Grad:      31.4 mmHg  AV Mean Grad:      18.5 mmHg  LVOT Vmax:         78.30 cm/s  LVOT Vmean:        50.900 cm/s  LVOT VTI:          0.168 m  LVOT/AV VTI ratio: 0.25     AORTA  Ao Root diam: 3.40 cm  Ao Asc diam:  3.20 cm  Ao Arch diam: 2.5 cm   MITRAL VALVE                TRICUSPID VALVE  MV Area (PHT): 3.05 cm     TR Peak grad:   8.6 mmHg  MV Decel Time: 249 msec     TR Vmax:        147.00 cm/s  MV E velocity: 97.00 cm/s  MV A velocity: 102.00 cm/s  SHUNTS  MV E/A ratio:  0.95         Systemic VTI:  0.17 m                              Systemic Diam: 1.90 cm     Cath (08/2021) Conclusion       Prox RCA lesion is 30% stenosed.   Dist RCA lesion is 40% stenosed.   Prox Cx lesion is 50% stenosed.   Dist Cx lesion is 30% stenosed.   Prox LAD lesion is 50% stenosed.   Mid LAD lesion is 50% stenosed.   Dist LAD lesion is 50% stenosed.   Moderate non-obstructive disease in the LAD, Circumflex and RCA Severe low flow/low gradient aortic stenosis by echo Normal right and left heart pressures. (PCWP 11, LVEDP 12-15)   Recommendations: Continue workup for TAVR. Medical management of non-obstructive CAD.    TAVR CTA (09/2021) FINDINGS: Aortic Valve: Tri leaflet AV calcium score only 1263   Aorta: No aneurysm, normal arch vessels moderate calcified atherosclerosis   Sinotubular Junction: 26 mm   Ascending Thoracic Aorta: 32 mm   Aortic Arch: 27 mm   Descending Thoracic Aorta: 26 mm   Sinus of Valsalva Measurements:   Non-coronary: 32.1 mm   Right - coronary:  31 mm height 17.2 mm   Left - coronary: 31.3 mm  height 19.3 mm   Coronary Artery Height above Annulus:   Left Main: 13.3 mm above annulus   Right Coronary: 11.9 mm above annulus   Virtual Basal Annulus Measurements:   Maximum/Minimum Diameter: 26.6 mm x 22.4 mm   Perimeter: 76.1   Area: 439.5   Coronary Arteries: Sufficient height above annulus for deployment   Optimum Fluoroscopic Angle for Delivery: LAO 14 Caudal 14 degrees   IMPRESSION: 1.  Calcified tri leaflet AV with score only 1263   2. Annular area 439.5 suitable for lower limit 26 Sapien May be better suited for a 29 Medtronic Evolut Pro   3.  Coronary arteries suitable height above annulus for deployment   4. Optimum angiographic angle for deployment LAO 14 Caudal 14 degrees   5.  Membranous septal  length 7.4 mm   Recent Lab Findings: Recent Labs       Lab Results  Component Value Date    WBC 7.3 09/06/2021    HGB 12.6 (L) 09/17/2021    HGB 12.2 (L) 09/17/2021    HCT 37.0 (L) 09/17/2021    HCT 36.0 (L) 09/17/2021    PLT 263 09/06/2021    GLUCOSE 107 (H) 09/06/2021    CHOL 148 09/05/2021    TRIG 154 (H) 09/05/2021    HDL 39 (L) 09/05/2021    LDLCALC 82 09/05/2021    ALT 15 10/03/2020    AST 19 10/03/2020    NA 142 09/17/2021    NA 142 09/17/2021    K 4.3 09/17/2021    K 4.1 09/17/2021    CL 105 09/06/2021    CREATININE 1.00 09/06/2021    BUN 15 09/06/2021    CO2 24 09/06/2021    TSH 1.890 08/25/2019    INR 1.03 03/13/2018    HGBA1C 6.1 (H) 09/05/2021            Assessment / Plan:    Severe Aortic Valve Stenosis  Pt with class I-2 NYHA symptoms and evidence of severe AS by echo which appears to be from low flow low gradient variety  His echo has evidence of thickened and poor mobility leaflets and should have improvement with an AVR. Pt with his age and multiple comorbidities would best be served with TAVR and  the risks and goals of this procedure were discussed with pt and family and  they understand and wish to proceed.  The patient would have surgical bail out if need arises at time of TAVR I have spent 60 min in reviewing chart and viewing studies and in face to face with pt in discussions of issues and the procedure and in coordination of further care.  Paroxysmal Afib  Will continue NOAC following procedure History of CVA  Pt understands that has increase risk of CVA with TAVR secondary to his history             _0 @ 10/21/2021 4:56 PM

## 2021-11-05 NOTE — Telephone Encounter (Signed)
Rx refill sent to pharmacy. 

## 2021-11-06 ENCOUNTER — Inpatient Hospital Stay (HOSPITAL_COMMUNITY): Payer: PPO | Admitting: Physician Assistant

## 2021-11-06 ENCOUNTER — Inpatient Hospital Stay (HOSPITAL_COMMUNITY)
Admission: RE | Admit: 2021-11-06 | Discharge: 2021-11-07 | DRG: 267 | Disposition: A | Discharge status: Home / Self Care | Payer: PPO | Attending: Cardiovascular Disease | Admitting: Cardiovascular Disease

## 2021-11-06 ENCOUNTER — Other Ambulatory Visit: Payer: Self-pay | Admitting: Cardiology

## 2021-11-06 ENCOUNTER — Inpatient Hospital Stay (HOSPITAL_COMMUNITY): Payer: PPO

## 2021-11-06 ENCOUNTER — Other Ambulatory Visit: Payer: Self-pay

## 2021-11-06 ENCOUNTER — Encounter (HOSPITAL_COMMUNITY)
Admission: RE | Disposition: A | Discharge status: Home / Self Care | Payer: Self-pay | Source: Home / Self Care | Attending: Cardiovascular Disease

## 2021-11-06 ENCOUNTER — Encounter (HOSPITAL_COMMUNITY): Payer: Self-pay | Admitting: Cardiovascular Disease

## 2021-11-06 ENCOUNTER — Inpatient Hospital Stay (HOSPITAL_COMMUNITY): Payer: PPO | Admitting: Certified Registered Nurse Anesthetist

## 2021-11-06 DIAGNOSIS — I350 Nonrheumatic aortic (valve) stenosis: Secondary | ICD-10-CM

## 2021-11-06 DIAGNOSIS — Z87891 Personal history of nicotine dependence: Secondary | ICD-10-CM | POA: Diagnosis not present

## 2021-11-06 DIAGNOSIS — I4891 Unspecified atrial fibrillation: Secondary | ICD-10-CM

## 2021-11-06 DIAGNOSIS — E7800 Pure hypercholesterolemia, unspecified: Secondary | ICD-10-CM | POA: Diagnosis present

## 2021-11-06 DIAGNOSIS — Z952 Presence of prosthetic heart valve: Secondary | ICD-10-CM

## 2021-11-06 DIAGNOSIS — Z79899 Other long term (current) drug therapy: Secondary | ICD-10-CM | POA: Diagnosis not present

## 2021-11-06 DIAGNOSIS — Z8673 Personal history of transient ischemic attack (TIA), and cerebral infarction without residual deficits: Secondary | ICD-10-CM | POA: Diagnosis not present

## 2021-11-06 DIAGNOSIS — I10 Essential (primary) hypertension: Secondary | ICD-10-CM | POA: Diagnosis not present

## 2021-11-06 DIAGNOSIS — Z7901 Long term (current) use of anticoagulants: Secondary | ICD-10-CM

## 2021-11-06 DIAGNOSIS — I447 Left bundle-branch block, unspecified: Secondary | ICD-10-CM

## 2021-11-06 DIAGNOSIS — Z801 Family history of malignant neoplasm of trachea, bronchus and lung: Secondary | ICD-10-CM | POA: Diagnosis not present

## 2021-11-06 DIAGNOSIS — I2510 Atherosclerotic heart disease of native coronary artery without angina pectoris: Secondary | ICD-10-CM | POA: Diagnosis present

## 2021-11-06 DIAGNOSIS — I480 Paroxysmal atrial fibrillation: Secondary | ICD-10-CM | POA: Diagnosis not present

## 2021-11-06 DIAGNOSIS — I110 Hypertensive heart disease with heart failure: Secondary | ICD-10-CM

## 2021-11-06 DIAGNOSIS — Z8619 Personal history of other infectious and parasitic diseases: Secondary | ICD-10-CM

## 2021-11-06 DIAGNOSIS — Z006 Encounter for examination for normal comparison and control in clinical research program: Secondary | ICD-10-CM | POA: Diagnosis not present

## 2021-11-06 HISTORY — PX: TRANSCATHETER AORTIC VALVE REPLACEMENT, TRANSFEMORAL: SHX6400

## 2021-11-06 HISTORY — PX: INTRAOPERATIVE TRANSTHORACIC ECHOCARDIOGRAM: SHX6523

## 2021-11-06 HISTORY — DX: Presence of prosthetic heart valve: Z95.2

## 2021-11-06 LAB — POCT I-STAT, CHEM 8
BUN: 24 mg/dL — ABNORMAL HIGH (ref 8–23)
BUN: 22 mg/dL (ref 8–23)
BUN: 22 mg/dL (ref 8–23)
BUN: 23 mg/dL (ref 8–23)
Calcium, Ion: 1.3 mmol/L (ref 1.15–1.40)
Calcium, Ion: 1.24 mmol/L (ref 1.15–1.40)
Calcium, Ion: 1.26 mmol/L (ref 1.15–1.40)
Calcium, Ion: 1.27 mmol/L (ref 1.15–1.40)
Chloride: 108 mmol/L (ref 98–111)
Chloride: 107 mmol/L (ref 98–111)
Chloride: 108 mmol/L (ref 98–111)
Chloride: 107 mmol/L (ref 98–111)
Creatinine, Ser: 1 mg/dL (ref 0.61–1.24)
Creatinine, Ser: 0.9 mg/dL (ref 0.61–1.24)
Creatinine, Ser: 0.9 mg/dL (ref 0.61–1.24)
Creatinine, Ser: 0.9 mg/dL (ref 0.61–1.24)
Glucose, Bld: 117 mg/dL — ABNORMAL HIGH (ref 70–99)
Glucose, Bld: 143 mg/dL — ABNORMAL HIGH (ref 70–99)
Glucose, Bld: 149 mg/dL — ABNORMAL HIGH (ref 70–99)
Glucose, Bld: 138 mg/dL — ABNORMAL HIGH (ref 70–99)
HCT: 38 % — ABNORMAL LOW (ref 39.0–52.0)
HCT: 33 % — ABNORMAL LOW (ref 39.0–52.0)
HCT: 35 % — ABNORMAL LOW (ref 39.0–52.0)
HCT: 35 % — ABNORMAL LOW (ref 39.0–52.0)
Hemoglobin: 12.9 g/dL — ABNORMAL LOW (ref 13.0–17.0)
Hemoglobin: 11.2 g/dL — ABNORMAL LOW (ref 13.0–17.0)
Hemoglobin: 11.9 g/dL — ABNORMAL LOW (ref 13.0–17.0)
Hemoglobin: 11.9 g/dL — ABNORMAL LOW (ref 13.0–17.0)
Potassium: 4.3 mmol/L (ref 3.5–5.1)
Potassium: 4.4 mmol/L (ref 3.5–5.1)
Potassium: 4.5 mmol/L (ref 3.5–5.1)
Potassium: 4.4 mmol/L (ref 3.5–5.1)
Sodium: 139 mmol/L (ref 135–145)
Sodium: 139 mmol/L (ref 135–145)
Sodium: 139 mmol/L (ref 135–145)
Sodium: 139 mmol/L (ref 135–145)
TCO2: 22 mmol/L (ref 22–32)
TCO2: 21 mmol/L — ABNORMAL LOW (ref 22–32)
TCO2: 22 mmol/L (ref 22–32)
TCO2: 21 mmol/L — ABNORMAL LOW (ref 22–32)

## 2021-11-06 LAB — ECHOCARDIOGRAM LIMITED
AR max vel: 4.71 cm2
AV Area VTI: 3.94 cm2
AV Area mean vel: 4.47 cm2
AV Mean grad: 2.5 mmHg
AV Peak grad: 4.8 mmHg
Ao pk vel: 1.1 m/s
Calc EF: 60.2 %
Single Plane A2C EF: 61 %
Single Plane A4C EF: 61.6 %

## 2021-11-06 LAB — ABO/RH: ABO/RH(D): O POS

## 2021-11-06 SURGERY — IMPLANTATION, AORTIC VALVE, TRANSCATHETER, FEMORAL APPROACH
Anesthesia: Monitor Anesthesia Care | Site: Chest

## 2021-11-06 MED ORDER — HEPARIN 6000 UNIT IRRIGATION SOLUTION
Status: DC | PRN
  Administered 2021-11-06: 2

## 2021-11-06 MED ORDER — SODIUM CHLORIDE 0.9 % IV SOLN: INTRAVENOUS | Status: DC

## 2021-11-06 MED ORDER — IODIXANOL 320 MG/ML IV SOLN
INTRAVENOUS | Status: DC | PRN
  Administered 2021-11-06: 40 mL

## 2021-11-06 MED ORDER — NITROGLYCERIN IN D5W 200-5 MCG/ML-% IV SOLN: 0.0000 ug/min | INTRAVENOUS | Status: DC

## 2021-11-06 MED ORDER — LIDOCAINE HCL 1 % IJ SOLN
INTRAMUSCULAR | Status: DC | PRN
  Administered 2021-11-06: 8 mL

## 2021-11-06 MED ORDER — ACETAMINOPHEN 500 MG PO TABS
1000.0000 mg | ORAL_TABLET | Freq: Once | ORAL | Status: AC
  Administered 2021-11-06: 1000 mg via ORAL
  Filled 2021-11-06: qty 2

## 2021-11-06 MED ORDER — CHLORHEXIDINE GLUCONATE 0.12 % MT SOLN
OROMUCOSAL | Status: AC
  Administered 2021-11-06: 15 mL via OROMUCOSAL
  Filled 2021-11-06: qty 15

## 2021-11-06 MED ORDER — FENTANYL CITRATE (PF) 250 MCG/5ML IJ SOLN
INTRAMUSCULAR | Status: DC | PRN
  Administered 2021-11-06: 50 ug via INTRAVENOUS

## 2021-11-06 MED ORDER — PROTAMINE SULFATE 10 MG/ML IV SOLN
INTRAVENOUS | Status: DC | PRN
  Administered 2021-11-06: 160 mg via INTRAVENOUS

## 2021-11-06 MED ORDER — CHLORHEXIDINE GLUCONATE 4 % EX LIQD: 30.0000 mL | CUTANEOUS | Status: DC

## 2021-11-06 MED ORDER — EPHEDRINE SULFATE-NACL 50-0.9 MG/10ML-% IV SOSY
PREFILLED_SYRINGE | INTRAVENOUS | Status: DC | PRN
  Administered 2021-11-06: 10 mg via INTRAVENOUS

## 2021-11-06 MED ORDER — CHLORHEXIDINE GLUCONATE 4 % EX LIQD: 60.0000 mL | Freq: Once | CUTANEOUS | Status: DC

## 2021-11-06 MED ORDER — ONDANSETRON HCL 4 MG/2ML IJ SOLN
INTRAMUSCULAR | Status: AC
  Filled 2021-11-06: qty 2

## 2021-11-06 MED ORDER — HEPARIN 6000 UNIT IRRIGATION SOLUTION
Status: AC
  Filled 2021-11-06: qty 1500

## 2021-11-06 MED ORDER — CHLORHEXIDINE GLUCONATE 0.12 % MT SOLN: 15.0000 mL | Freq: Once | OROMUCOSAL | Status: AC

## 2021-11-06 MED ORDER — LIDOCAINE HCL 1 % IJ SOLN
INTRAMUSCULAR | Status: AC
  Filled 2021-11-06: qty 20

## 2021-11-06 MED ORDER — PROPOFOL 500 MG/50ML IV EMUL
INTRAVENOUS | Status: DC | PRN
  Administered 2021-11-06: 10 mg via INTRAVENOUS
  Administered 2021-11-06: 10 ug/kg/min via INTRAVENOUS

## 2021-11-06 MED ORDER — MORPHINE SULFATE (PF) 2 MG/ML IV SOLN: 1.0000 mg | INTRAVENOUS | Status: DC | PRN

## 2021-11-06 MED ORDER — ACETAMINOPHEN 325 MG PO TABS
650.0000 mg | ORAL_TABLET | Freq: Four times a day (QID) | ORAL | Status: DC | PRN
  Administered 2021-11-07: 650 mg via ORAL

## 2021-11-06 MED ORDER — ONDANSETRON HCL 4 MG/2ML IJ SOLN
INTRAMUSCULAR | Status: DC | PRN
  Administered 2021-11-06: 4 mg via INTRAVENOUS

## 2021-11-06 MED ORDER — LACTATED RINGERS IV SOLN: INTRAVENOUS | Status: DC | PRN

## 2021-11-06 MED ORDER — LACTATED RINGERS IV SOLN: INTRAVENOUS | Status: DC

## 2021-11-06 MED ORDER — PROPOFOL 10 MG/ML IV BOLUS
INTRAVENOUS | Status: AC
  Filled 2021-11-06: qty 20

## 2021-11-06 MED ORDER — SODIUM CHLORIDE 0.9% FLUSH: 3.0000 mL | Freq: Two times a day (BID) | INTRAVENOUS | Status: DC

## 2021-11-06 MED ORDER — CEFAZOLIN SODIUM-DEXTROSE 2-4 GM/100ML-% IV SOLN
2.0000 g | Freq: Three times a day (TID) | INTRAVENOUS | Status: AC
  Administered 2021-11-06 (×2): 2 g via INTRAVENOUS
  Filled 2021-11-06 (×3): qty 100

## 2021-11-06 MED ORDER — CHLORHEXIDINE GLUCONATE 0.12 % MT SOLN: 15.0000 mL | Freq: Once | OROMUCOSAL | Status: DC

## 2021-11-06 MED ORDER — SODIUM CHLORIDE 0.9 % IV SOLN: 250.0000 mL | INTRAVENOUS | Status: DC

## 2021-11-06 MED ORDER — OXYCODONE HCL 5 MG PO TABS: 5.0000 mg | ORAL_TABLET | ORAL | Status: DC | PRN

## 2021-11-06 MED ORDER — SODIUM CHLORIDE 0.9 % IV SOLN: 250.0000 mL | INTRAVENOUS | Status: DC | PRN

## 2021-11-06 MED ORDER — FENTANYL CITRATE (PF) 250 MCG/5ML IJ SOLN
INTRAMUSCULAR | Status: AC
  Filled 2021-11-06: qty 5

## 2021-11-06 MED ORDER — SODIUM CHLORIDE 0.9 % IV SOLN: INTRAVENOUS | Status: AC

## 2021-11-06 MED ORDER — ONDANSETRON HCL 4 MG/2ML IJ SOLN: 4.0000 mg | Freq: Four times a day (QID) | INTRAMUSCULAR | Status: DC | PRN

## 2021-11-06 MED ORDER — ORAL CARE MOUTH RINSE: 15.0000 mL | Freq: Once | OROMUCOSAL | Status: AC

## 2021-11-06 MED ORDER — SODIUM CHLORIDE 0.9% FLUSH: 3.0000 mL | INTRAVENOUS | Status: DC | PRN

## 2021-11-06 MED ORDER — ATORVASTATIN CALCIUM 40 MG PO TABS
40.0000 mg | ORAL_TABLET | Freq: Every day | ORAL | Status: DC
  Administered 2021-11-06 – 2021-11-07 (×2): 40 mg via ORAL
  Filled 2021-11-06 (×2): qty 1

## 2021-11-06 MED ORDER — HEPARIN SODIUM (PORCINE) 1000 UNIT/ML IJ SOLN
INTRAMUSCULAR | Status: AC
  Filled 2021-11-06: qty 1

## 2021-11-06 MED ORDER — HEPARIN SODIUM (PORCINE) 1000 UNIT/ML IJ SOLN
INTRAMUSCULAR | Status: DC | PRN
  Administered 2021-11-06: 3000 [IU] via INTRAVENOUS
  Administered 2021-11-06: 13000 [IU] via INTRAVENOUS

## 2021-11-06 MED ORDER — PROTAMINE SULFATE 10 MG/ML IV SOLN
INTRAVENOUS | Status: AC
  Filled 2021-11-06: qty 25

## 2021-11-06 MED ORDER — TRAMADOL HCL 50 MG PO TABS
50.0000 mg | ORAL_TABLET | ORAL | Status: DC | PRN
  Administered 2021-11-06 – 2021-11-07 (×2): 100 mg via ORAL
  Filled 2021-11-06 (×2): qty 2

## 2021-11-06 MED ORDER — ACETAMINOPHEN 650 MG RE SUPP: 650.0000 mg | Freq: Four times a day (QID) | RECTAL | Status: DC | PRN

## 2021-11-06 SURGICAL SUPPLY — 56 items
BAG COUNTER SPONGE SURGICOUNT (BAG) ×2 IMPLANT
BAG DECANTER FOR FLEXI CONT (MISCELLANEOUS) IMPLANT
BLADE CLIPPER SURG (BLADE) IMPLANT
BLADE OSCILLATING /SAGITTAL (BLADE) IMPLANT
BLADE STERNUM SYSTEM 6 (BLADE) IMPLANT
CABLE ADAPT CONN TEMP 6FT (ADAPTER) ×2 IMPLANT
CATH DIAG EXPO 6F AL1 (CATHETERS) IMPLANT
CATH DIAG EXPO 6F VENT PIG 145 (CATHETERS) ×4 IMPLANT
CATH INFINITI 6F AL2 (CATHETERS) IMPLANT
CATH S G BIP PACING (CATHETERS) ×2 IMPLANT
CHLORAPREP W/TINT 26 (MISCELLANEOUS) ×2 IMPLANT
CLOSURE MYNX CONTROL 6F/7F (Vascular Products) IMPLANT
CNTNR URN SCR LID CUP LEK RST (MISCELLANEOUS) ×4 IMPLANT
CONT SPEC 4OZ STRL OR WHT (MISCELLANEOUS) ×4
COVER BACK TABLE 80X110 HD (DRAPES) ×2 IMPLANT
DERMABOND ADVANCED .7 DNX12 (GAUZE/BANDAGES/DRESSINGS) ×2 IMPLANT
DEVICE CLOSURE PERCLS PRGLD 6F (VASCULAR PRODUCTS) ×4 IMPLANT
DRSG IV TEGADERM 3.5X4.5 STRL (GAUZE/BANDAGES/DRESSINGS) IMPLANT
DRSG TEGADERM 4X4.75 (GAUZE/BANDAGES/DRESSINGS) ×4 IMPLANT
ELECT REM PT RETURN 9FT ADLT (ELECTROSURGICAL) ×2
ELECTRODE REM PT RTRN 9FT ADLT (ELECTROSURGICAL) ×2 IMPLANT
GAUZE SPONGE 4X4 12PLY STRL (GAUZE/BANDAGES/DRESSINGS) ×2 IMPLANT
GLOVE BIO SURGEON STRL SZ7.5 (GLOVE) ×2 IMPLANT
GLOVE ECLIPSE 7.0 STRL STRAW (GLOVE) ×2 IMPLANT
GOWN STRL REUS W/ TWL LRG LVL3 (GOWN DISPOSABLE) IMPLANT
GOWN STRL REUS W/ TWL XL LVL3 (GOWN DISPOSABLE) ×2 IMPLANT
GOWN STRL REUS W/TWL LRG LVL3 (GOWN DISPOSABLE)
GOWN STRL REUS W/TWL XL LVL3 (GOWN DISPOSABLE) ×2
KIT BASIN OR (CUSTOM PROCEDURE TRAY) ×2 IMPLANT
KIT HEART LEFT (KITS) ×2 IMPLANT
KIT SAPIAN 3 ULTRA RESILIA 26 (Valve) IMPLANT
KIT TURNOVER KIT B (KITS) ×2 IMPLANT
NS IRRIG 1000ML POUR BTL (IV SOLUTION) ×2 IMPLANT
PACK ENDO MINOR (CUSTOM PROCEDURE TRAY) ×2 IMPLANT
PAD ARMBOARD 7.5X6 YLW CONV (MISCELLANEOUS) ×4 IMPLANT
PAD ELECT DEFIB RADIOL ZOLL (MISCELLANEOUS) ×2 IMPLANT
PERCLOSE PROGLIDE 6F (VASCULAR PRODUCTS) ×4
POSITIONER HEAD DONUT 9IN (MISCELLANEOUS) ×2 IMPLANT
SET MICROPUNCTURE 5F STIFF (MISCELLANEOUS) ×2 IMPLANT
SHEATH BRITE TIP 7FR 35CM (SHEATH) ×2 IMPLANT
SHEATH PINNACLE 6F 10CM (SHEATH) ×2 IMPLANT
SHEATH PINNACLE 8F 10CM (SHEATH) ×2 IMPLANT
SLEEVE REPOSITIONING LENGTH 30 (MISCELLANEOUS) ×2 IMPLANT
SPIKE FLUID TRANSFER (MISCELLANEOUS) ×2 IMPLANT
SPONGE T-LAP 18X18 ~~LOC~~+RFID (SPONGE) ×2 IMPLANT
STOPCOCK MORSE 400PSI 3WAY (MISCELLANEOUS) ×4 IMPLANT
SUT PROLENE 6 0 C 1 30 (SUTURE) IMPLANT
SUT SILK  1 MH (SUTURE) ×2
SUT SILK 1 MH (SUTURE) ×2 IMPLANT
SYR 50ML LL SCALE MARK (SYRINGE) ×2 IMPLANT
SYR BULB IRRIG 60ML STRL (SYRINGE) IMPLANT
TOWEL GREEN STERILE (TOWEL DISPOSABLE) ×4 IMPLANT
TRANSDUCER W/STOPCOCK (MISCELLANEOUS) ×4 IMPLANT
WIRE EMERALD 3MM-J .035X150CM (WIRE) ×2 IMPLANT
WIRE EMERALD 3MM-J .035X260CM (WIRE) ×2 IMPLANT
WIRE EMERALD ST .035X260CM (WIRE) ×4 IMPLANT

## 2021-11-06 NOTE — Discharge Summary (Incomplete)
Jon Mejia VALVE TEAM  Discharge Summary    Patient ID: Jon Mejia MRN: [id number]; DOB: [birth year]  Admit date: 11/06/2021 Discharge date: 11/07/2021  Primary Care Provider: [name], MD  Primary Cardiologist: Dr. Rockey Situ, MD/ Dr. Angelena Form, MD & Dr. Lavonna Monarch, MD (TAVR)   Discharge Diagnoses    Principal Problem:   S/P TAVR (transcatheter aortic valve replacement) Active Problems:   Primary hypertension   Pure hypercholesterolemia   History of stroke   Severe aortic stenosis   Atrial fibrillation ([place])   New onset left bundle branch block (LBBB)  Allergies No Known Allergies  Diagnostic Studies/Procedures   TAVR OPERATIVE NOTE     Date of Procedure:                11/06/2021   Preoperative Diagnosis:      Severe Aortic Stenosis    Postoperative Diagnosis:    Same    Procedure:        Transcatheter Aortic Valve Replacement - Percutaneous right Transfemoral Approach             Edwards Sapien 3 Ultra THV (size 26 mm, model # 9755RSL serial # [id number])              Co-Surgeons:                        Coralie Common MD and  Lauree Chandler      Anesthesiologist:                  Roderic Palau MD   Echocardiographer:              Gwen Pounds MD   Pre-operative Echo Findings: Severe aortic stenosis normal left ventricular systolic function   Post-operative Echo Findings: no paravalvular leak no left ventricular systolic function _____________   Echo: Completed but pending formal read at the time of discharge   History of Present Illness     Jon Mejia is a 81 y.o. male with a history of of CVA (2020), paroxysmal Afib on Eliquis, PACs, former tobacco abuse, HTN, HLD and LFLG aortic stenosis who presented to [facility] on 11/06/21 for planned TAVR.   Jon Mejia is followed by Dr. Rockey Situ for his cardiology care. He had a stroke in 02/2018. He was found to have atrial fibrillation and Eliquis  was started. He was recently seen by Dr. Rockey Situ and a murmur was detected on exam. Echocardiogram 03/20/21 showed normal LVEF, mild LVH, mild MR, moderate AS mean gradient 18.34mHg, severe valve stenosis and he was referred to the Structural Heart team for further investigation of his aortic stenosis.   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 Normal right and left heart pressures. (PCWP 11, LVEDP 12-15). Medical management of non-obstructive CAD. Pre TAVR scans showed anatomy suitable to proceed.   He was then evaluated by the multidisciplinary valve team and felt to have severe, symptomatic aortic stenosis and to be a suitable candidate for TAVR, which was set up for 11/06/21.     Hospital Course     Severe AS: s/p successful TAVR with a 26 mm Edwards Sapien 3 THV via the TF approach on 11/06/21. Post operative echo pending. Groin sites are stable. ECG with NSR and no high grade heart block however with new LBBB therefore ZIO monitor place prior to discharge. He was continued on Eliquis monotherapy. SBE discussed and will be  RX'ed at follow up next week. Post procedure care discussed with patient and family. He has ambulated with CR without difficulty. Plan TOC follow up with our team next week.   New LBBB: Noted on post procedure EKG. ZIO monitor placed prior to d/c. Will follow results.   Paroxsymal atrial fibrillation: Eliquis restarted post procedure with no issues. No ASA given AC.   Hx of CVA: No new neuro symptoms.   HTN: Antihypertensives held in the post op setting. Will restart Toprol '25mg'$ , amlodipine '5mg'$  on day of discharge.   HLD: Last LDL, 97.   Incidental findings: Multiple soft tissue lesions are seen in the gallbladder which are likely gallbladder polyps, largest measures 1.3 cm. Recommend ultrasound for further evaluation. 9/19 [place]: Gallbladder sludge and stones. No sonographic evidence of acute cholecystitis. PWW discussed w/pt possibly  needing cholesytectomy.    Consultants: None    The patient was seen and examined by Dr. Angelena Form who feels that he is stable and ready for discharge today, 11/07/21.  _____________  Discharge Vitals Blood pressure 119/66, pulse 76, temperature 98.1 F (36.7 C), temperature source Oral, resp. rate 17, height '5\' 5"'$  (1.651 m), weight 82.9 kg, SpO2 95 %.  Filed Weights   11/06/21 0600 11/07/21 0516  Weight: 82.6 kg 82.9 kg   General: Well developed, well nourished, NAD Neck: Negative for carotid bruits. No JVD Lungs:Clear to ausculation bilaterally. No wheezes, rales, or rhonchi. Breathing is unlabored. Cardiovascular: RRR with S1 S2. Soft systolic murmur Extremities: No edema. Neuro: Alert and oriented. No focal deficits. No facial asymmetry. MAE spontaneously. Psych: Responds to questions appropriately with normal affect.    Labs & Radiologic Studies    CBC Recent Labs    11/06/21 0945 11/07/21 0701  WBC  --  9.7  HGB 11.9* 12.4*  HCT 35.0* 35.8*  MCV  --  87.3  PLT  --  161   Basic Metabolic Panel Recent Labs    11/06/21 0945 11/07/21 0701  NA 139 135  K 4.4 3.7  CL 107 107  CO2  --  22  GLUCOSE 138* 114*  BUN 23 14  CREATININE 0.90 0.94  CALCIUM  --  8.8*  MG  --  1.8   Liver Function Tests No results for input(s): "AST", "ALT", "ALKPHOS", "BILITOT", "PROT", "ALBUMIN" in the last 72 hours. No results for input(s): "LIPASE", "AMYLASE" in the last 72 hours. Cardiac Enzymes No results for input(s): "CKTOTAL", "CKMB", "CKMBINDEX", "TROPONINI" in the last 72 hours. BNP Invalid input(s): "POCBNP" D-Dimer No results for input(s): "DDIMER" in the last 72 hours. Hemoglobin A1C No results for input(s): "HGBA1C" in the last 72 hours. Fasting Lipid Panel No results for input(s): "CHOL", "HDL", "LDLCALC", "TRIG", "CHOLHDL", "LDLDIRECT" in the last 72 hours. Thyroid Function Tests No results for input(s): "TSH", "T4TOTAL", "T3FREE", "THYROIDAB" in the last 72  hours.  Invalid input(s): "FREET3" _____________  ECHOCARDIOGRAM LIMITED  Result Date: 11/06/2021    ECHOCARDIOGRAM LIMITED REPORT   Patient Name:   Jon Mejia Date of Exam: 11/06/2021 Medical Rec #:  [id number]       Height:       65.0 in Accession #:    [id number]      Weight:       182.0 lb Date of Birth:  08/01/1940       BSA:          1.900 m Patient Age:    74 years        BP:  121/105 mmHg Patient Gender: M               HR:           62 bpm. Exam Location:  Inpatient Procedure: Limited Echo, Color Doppler and Cardiac Doppler Indications:     I35.0 Nonrheumatic aortic (valve) stenosis  History:         Patient has prior history of Echocardiogram examinations, most                  recent 08/16/2021. Abnormal ECG, Stroke, Aortic Valve Disease,                  Arrythmias:Atrial Fibrillation, Signs/Symptoms:Edema; Risk                  Factors:Dyslipidemia. Aortic stenosis.                  Aortic Valve: 26 mm Sapien prosthetic, stented (TAVR) valve is                  present in the aortic position. Procedure Date: 11/06/2021.  Sonographer:     Roseanna Rainbow RDCS Referring Phys:  [place] Diagnosing Phys: Rudean Haskell MD IMPRESSIONS  1. Interventional TTE for TAVR placement.  2. Prior to TAVR placement, severe aortic stenosis. Mean Gradient 20 mm Hg, Peak gradient 38 mm Hg, but with DVI 0.27 and aortic valve area 1.01 cm2. No AI.  3. Post procedure a 26 mm Sapien valve was present. No PVL. Mean gradient 3 mm Hg, Peak gradient 5 mm Hg. Normal DVI. EOA 4.27 cm2.  4. Left ventricular ejection fraction, by estimation, is 60 to 65%. The left ventricle has normal function. The left ventricle has no regional wall motion abnormalities.  5. Trivial mitral valve regurgitation. Moderate mitral annular calcification.  6. There is a 26 mm Sapien prosthetic (TAVR) valve present in the aortic position. Procedure Date: 11/06/2021. Comparison(s): Echo findings are consistent with  normal structure and function of the aortic valve prosthesis. FINDINGS  Left Ventricle: Left ventricular ejection fraction, by estimation, is 60 to 65%. The left ventricle has normal function. The left ventricle has no regional wall motion abnormalities. Mitral Valve: Moderate mitral annular calcification. Trivial mitral valve regurgitation. Tricuspid Valve: The tricuspid valve is normal in structure. Tricuspid valve regurgitation is not demonstrated. Aortic Valve: Aortic valve mean gradient measures 2.5 mmHg. Aortic valve peak gradient measures 4.8 mmHg. Aortic valve area, by VTI measures 3.94 cm. There is a 26 mm Sapien prosthetic, stented (TAVR) valve present in the aortic position. Procedure Date: 11/06/2021. Echo findings are consistent with normal structure and function of the aortic valve prosthesis. LEFT VENTRICLE PLAX 2D LVOT diam:     2.50 cm LV SV:         111 LV SV Index:   59 LVOT Area:     4.91 cm  LV Volumes (MOD) LV vol d, MOD A2C: 58.0 ml LV vol d, MOD A4C: 63.1 ml LV vol s, MOD A2C: 22.6 ml LV vol s, MOD A4C: 24.2 ml LV SV MOD A2C:     35.4 ml LV SV MOD A4C:     63.1 ml LV SV MOD BP:      36.4 ml AORTIC VALVE AV Area (Vmax):    4.71 cm AV Area (Vmean):   4.47 cm AV Area (VTI):     3.94 cm AV Vmax:           109.50 cm/s  AV Vmean:          73.300 cm/s AV VTI:            0.283 m AV Peak Grad:      4.8 mmHg AV Mean Grad:      2.5 mmHg LVOT Vmax:         105.00 cm/s LVOT Vmean:        66.700 cm/s LVOT VTI:          0.227 m LVOT/AV VTI ratio: 0.80  AORTA Ao Asc diam: 3.30 cm  SHUNTS Systemic VTI:  0.23 m Systemic Diam: 2.50 cm Rudean Haskell MD Electronically signed by Rudean Haskell MD Signature Date/Time: 11/06/2021/3:05:56 PM    Final    Structural Heart Procedure  Result Date: 11/06/2021 See surgical note for result.  DG Chest 2 View  Result Date: 11/04/2021 CLINICAL DATA:  Preoperative exam prior to TAVR. EXAM: CHEST - 2 VIEW COMPARISON:  None Available. FINDINGS: The heart  size and mediastinal contours are within normal limits. Both lungs are clear. The visualized skeletal structures are unremarkable. IMPRESSION: No active cardiopulmonary disease. Electronically Signed   By: Dorise Bullion III M.D.   On: 11/04/2021 13:34   US ABDOMEN LIMITED RUQ (LIVER/GB)  Result Date: 10/17/2021 CLINICAL DATA:  Soft tissue lesion in the gallbladder seen on CT. EXAM: ULTRASOUND ABDOMEN LIMITED RIGHT UPPER QUADRANT COMPARISON:  CT abdomen pelvis dated 09/28/2021. FINDINGS: Gallbladder: There is sludge and several stones within the gallbladder. No gallbladder wall thickening or pericholecystic fluid. Negative sonographic Murphy's sign. Common bile duct: Diameter: 4 mm Liver: There is diffuse increased liver echogenicity most commonly seen in the setting of fatty infiltration. Superimposed inflammation or fibrosis is not excluded. Clinical correlation is recommended. Portal vein is patent on color Doppler imaging with normal direction of blood flow towards the liver. Other: None. IMPRESSION: 1. Gallbladder sludge and stones. No sonographic evidence of acute cholecystitis. 2. Fatty liver. Electronically Signed   By: Anner Crete M.D.   On: 10/17/2021 19:14   Disposition   Pt is being discharged home today in good condition.  Follow-up Plans & Appointments    Follow-up Information     Eileen Stanford, PA-C Follow up on 11/14/2021.   Specialties: Cardiology, Radiology Why: @ 1:30pm. Please arrive to your appointment at 1:15pm Contact information: [address] [place] [id number] [phone]                Discharge Instructions     Call MD for:  difficulty breathing, headache or visual disturbances   Complete by: As directed    Call MD for:  extreme fatigue   Complete by: As directed    Call MD for:  hives   Complete by: As directed    Call MD for:  persistant dizziness or light-headedness   Complete by: As directed    Call MD for:   persistant nausea and vomiting   Complete by: As directed    Call MD for:  redness, tenderness, or signs of infection (pain, swelling, redness, odor or green/yellow discharge around incision site)   Complete by: As directed    Call MD for:  severe uncontrolled pain   Complete by: As directed    Call MD for:  temperature >100.4   Complete by: As directed    Diet - low sodium heart healthy   Complete by: As directed    Discharge instructions   Complete by: As directed    ACTIVITY  AND EXERCISE  Daily activity and exercise are an important part of your recovery. People recover at different rates depending on their general health and type of valve procedure.  Most people recovering from TAVR feel better relatively quickly   No lifting, pushing, pulling more than 10 pounds (examples to avoid: groceries, vacuuming, gardening, golfing):             - For one week with a procedure through the groin.             - For six weeks for procedures through the chest wall or neck. NOTE: You will typically see one of our providers 7-14 days after your procedure to discuss [place] the above activities.      DRIVING  Do not drive until you are seen for follow up and cleared by a provider. Generally, we ask patient to not drive for 1 week after their procedure.  If you have been told by your doctor in the past that you may not drive, you must talk with him/her before you begin driving again.   DRESSING  Groin site: you may leave the clear dressing over the site for up to one week or until it falls off.   HYGIENE  If you had a femoral (leg) procedure, you may take a shower when you return home. After the shower, pat the site dry. Do NOT use powder, oils or lotions in your groin area until the site has completely healed.  If you had a chest procedure, you may shower when you return home unless specifically instructed not to by your discharging practitioner.             - DO NOT scrub incision;  pat dry with a towel.             - DO NOT apply any lotions, oils, powders to the incision.             - No tub baths / swimming for at least 2 weeks.  If you notice any fevers, chills, increased pain, swelling, bleeding or pus, please contact your doctor.   ADDITIONAL INFORMATION  If you are going to have an upcoming dental procedure, please contact our office as you will require antibiotics ahead of time to prevent infection on your heart valve.    If you have any questions or concerns you can call the structural heart phone during normal business hours 8am-4pm. If you have an urgent need after hours or weekends please call [phone] to talk to the on call provider for general cardiology. If you have an emergency that requires immediate attention, please call 911.    After TAVR Checklist  Check  Test Description  Follow up appointment in 1-2 weeks  You will see our structural heart advanced practice provider. Your incision sites will be checked and you will be cleared to drive and resume all normal activities if you are doing well.    1 month echo and follow up  You will have an echo to check on your new heart valve and be seen back in the office by a structural heart advanced practice provider.  Follow up with your primary cardiologist You will need to be seen by your primary cardiologist in the following 3-6 months after your 1 month appointment in the valve clinic.   1 year echo and follow up You will have another echo to check on your heart valve after 1 year and be seen back in  the office by a structural heart advanced practice provider. This your last structural heart visit.  Bacterial endocarditis prophylaxis  You will have to take antibiotics for the rest of your life before all dental procedures (even teeth cleanings) to protect your heart valve. Antibiotics are also required before some surgeries. Please check with your cardiologist before scheduling any surgeries. Also, please  make sure to tell us if you have a penicillin allergy as you will require an alternative antibiotic.   Increase activity slowly   Complete by: As directed    Remove dressing in 24 hours   Complete by: As directed    You may change your dressing to a bandaid after discharge if you wish      Discharge Medications   Allergies as of 11/07/2021   No Known Allergies      Medication List     TAKE these medications    amLODipine 5 MG tablet Commonly known as: NORVASC TAKE 1 TABLET BY MOUTH DAILY   atorvastatin 40 MG tablet Commonly known as: LIPITOR TAKE ONE TABLET EVERY DAY   B-12 3000 MCG Caps Take 3,000 mcg by mouth daily as needed (energy).   Eliquis 5 MG Tabs tablet Generic drug: apixaban TAKE ONE (1) TABLET BY MOUTH TWO TIMES PER DAY   metoprolol succinate 25 MG 24 hr tablet Commonly known as: TOPROL-XL TAKE 1 TABLET BY MOUTH DAILY WITH OR IMMEDIATELY FOLLOWING A MEAL.       Outstanding Labs/Studies   None   Duration of Discharge Encounter   Greater than 30 minutes including physician time.  SignedKathyrn Drown, NP 11/07/2021, 9:23 AM [phone]   I have personally seen and examined this patient. I agree with the assessment and plan as outlined above.  He is doing well post TAVR. Groins stable. New LBBB. Will send home with 14 day cardiac monitor.  Discharge home today  Lauree Chandler, MD, [facility] 11/07/2021 10:00 AM

## 2021-11-06 NOTE — Anesthesia Postprocedure Evaluation (Signed)
Anesthesia Post Note  Patient: Jon Mejia  Procedure(s) Performed: Transcatheter Aortic Valve Replacement, Transfemoral (Chest) INTRAOPERATIVE TRANSTHORACIC ECHOCARDIOGRAM     Patient location during evaluation: Cath Lab Anesthesia Type: MAC Level of consciousness: awake and alert Pain management: pain level controlled Vital Signs Assessment: post-procedure vital signs reviewed and stable Respiratory status: spontaneous breathing, nonlabored ventilation, respiratory function stable and patient connected to nasal cannula oxygen Cardiovascular status: stable and blood pressure returned to baseline Postop Assessment: no apparent nausea or vomiting Anesthetic complications: no   No notable events documented.  Last Vitals:  Vitals:   11/06/21 1035 11/06/21 1050  BP: (!) 98/53 (!) 102/53  Pulse: (!) 51 (!) 51  Resp: 12 (!) 8  Temp:    SpO2: 96% 95%    Last Pain:  Vitals:   11/06/21 1018  TempSrc: Temporal  PainSc:                  Arihanna Estabrook,W. EDMOND

## 2021-11-06 NOTE — CV Procedure (Signed)
[facility] V[facility]  TAVR OPERATIVE NOTE   Date of Procedure:  11/06/2021  Preoperative Diagnosis: Severe Aortic Stenosis   Postoperative Diagnosis: Same   Procedure:   Transcatheter Aortic Valve Replacement - Transfemoral Approach  Edwards Sapien 3 THV (size 26 mm, model # [id number], serial # [id number])   Co-Surgeons:  Lauree Chandler, MD and Coralie Common, MD  Anesthesiologist:  Ola Spurr  Echocardiographer:  Gasper Sells  Pre-operative Echo Findings: Severe aortic stenosis Normal left ventricular systolic function  Post-operative Echo Findings: No paravalvular leak Normal left ventricular systolic function  BRIEF CLINICAL NOTE AND INDICATIONS FOR SURGERY  81 yo male with history of prior CVA, atrial fibrillation, PACs, former tobacco abuse, HTN, hyperlipidemia and aortic stenosis who is here today for TAVR. He has paroxysmal atrial fibrillation and is on Eliquis. He had a stroke in 2020. Echo 08/16/21 with LVEF=60-65%. Mild LVH. Normal RV size and function. The aortic valve is calcified and thickened with mean gradient 18.5 mmHg, peak gradient 31.4 mmHg, AVA 0.7 cm2, DI 0.25, SVI 25. This is consistent with low flow/low gradient severe aortic stenosis. Cardiac cath with moderate non-obstructive disease.   During the course of the patient's preoperative work up they have been evaluated comprehensively by a multidisciplinary team of specialists coordinated through the [facility] in the [facility].  They have been demonstrated to suffer from symptomatic severe aortic stenosis as noted above. The patient has been counseled extensively as to the relative risks and benefits of all options for the treatment of severe aortic stenosis including long term medical therapy, conventional surgery for aortic valve replacement, and transcatheter aortic valve replacement.  The patient has been independently evaluated by Dr. Lavonna Monarch  with CT surgery and they are felt to be at high risk for conventional surgical aortic valve replacement. The surgeon indicated the patient would be a poor candidate for conventional surgery. Based upon review of all of the patient's preoperative diagnostic tests they are felt to be candidate for transcatheter aortic valve replacement using the transfemoral approach as an alternative to high risk conventional surgery.    Following the decision to proceed with transcatheter aortic valve replacement, a discussion has been held regarding what types of management strategies would be attempted intraoperatively in the event of life-threatening complications, including whether or not the patient would be considered a candidate for the use of cardiopulmonary bypass and/or conversion to open sternotomy for attempted surgical intervention.  The patient has been advised of a variety of complications that might develop peculiar to this approach including but not limited to risks of death, stroke, paravalvular leak, aortic dissection or other major vascular complications, aortic annulus rupture, device embolization, cardiac rupture or perforation, acute myocardial infarction, arrhythmia, heart block or bradycardia requiring permanent pacemaker placement, congestive heart failure, respiratory failure, renal failure, pneumonia, infection, other late complications related to structural valve deterioration or migration, or other complications that might ultimately cause a temporary or permanent loss of functional independence or other long term morbidity.  The patient provides full informed consent for the procedure as described and all questions were answered preoperatively.    DETAILS OF THE OPERATIVE PROCEDURE  PREPARATION:   The patient is brought to the operating room on the above mentioned date and central monitoring was established by the anesthesia team including placement of a radial arterial line. The patient is  placed in the supine position on the operating table.  Intravenous antibiotics are administered. Conscious sedation is used.  Baseline transthoracic echocardiogram was performed. The patient's chest, abdomen, both groins, and both lower extremities are prepared and draped in a sterile manner. A time out procedure is performed.   PERIPHERAL ACCESS:   Using the modified Seldinger technique, femoral arterial and venous access were obtained with placement of a 6 Fr sheath in the artery and a 7 Fr sheath in the vein on the left side using u/s guidance.  A pigtail diagnostic catheter was passed through the femoral arterial sheath under fluoroscopic guidance into the aortic root.  A temporary transvenous pacemaker catheter was passed through the femoral venous sheath under fluoroscopic guidance into the right ventricle.  The pacemaker was tested to ensure stable lead placement and pacemaker capture. Aortic root angiography was performed in order to determine the optimal angiographic angle for valve deployment.  TRANSFEMORAL ACCESS:  A micropuncture kit was used to gain access to the right femoral artery using u/s guidance. Position confirmed with angiography. Pre-closure with double ProGlide closure devices. The patient was heparinized systemically and ACT verified > 250 seconds.    A 14 Fr transfemoral E-sheath was introduced into the right femoral artery after progressively dilating over an Amplatz superstiff wire. An AL-2 catheter was used to direct a straight-tip exchange length wire across the native aortic valve into the left ventricle. This was exchanged out for a pigtail catheter and position was confirmed in the LV apex. Simultaneous LV and Ao pressures were recorded.  The pigtail catheter was then exchanged for a Safari wire in the LV apex.   TRANSCATHETER HEART VALVE DEPLOYMENT:  An Edwards Sapien 3 THV (size 26 mm) was prepared and crimped per manufacturer's guidelines, and the proper orientation  of the valve is confirmed on the [employer] delivery system. The valve was advanced through the introducer sheath using normal technique until in an appropriate position in the abdominal aorta beyond the sheath tip. The balloon was then retracted and using the fine-tuning wheel was centered on the valve. The valve was then advanced across the aortic arch using appropriate flexion of the catheter. The valve was carefully positioned across the aortic valve annulus. The Commander catheter was retracted using normal technique. Once final position of the valve has been confirmed by angiographic assessment, the valve is deployed while temporarily holding ventilation and during rapid ventricular pacing to maintain systolic blood pressure < 50 mmHg and pulse pressure < 10 mmHg. The balloon inflation is held for >3 seconds after reaching full deployment volume. Once the balloon has fully deflated the balloon is retracted into the ascending aorta and valve function is assessed using TTE. There is felt to be no paravalvular leak and no central aortic insufficiency.  The patient's hemodynamic recovery following valve deployment is good.  The deployment balloon and guidewire are both removed. Echo demostrated acceptable post-procedural gradients, stable mitral valve function, and no AI.   PROCEDURE COMPLETION:  The sheath was then removed and closure devices were completed. Protamine was administered once femoral arterial repair was complete. The temporary pacemaker, pigtail catheters and femoral sheaths were removed with a Mynx closure device placed in the artery and manual pressure used for venous hemostasis.    The patient tolerated the procedure well and is transported to the surgical intensive care in stable condition. There were no immediate intraoperative complications. All sponge instrument and needle counts are verified correct at completion of the operation.   No blood products were administered during  the operation.  The patient received a total of 40 mL  of intravenous contrast during the procedure.  LVEDP: 15 mmHg  Lauree Chandler MD 11/06/2021 9:33 AM

## 2021-11-06 NOTE — Progress Notes (Signed)
Mobility Specialist: Progress Note   11/06/21 1524  Mobility  Activity Ambulated with assistance in hallway  Level of Assistance Contact guard assist, steadying assist  Assistive Device None  Distance Ambulated (ft) 330 ft  Activity Response Tolerated well  $Mobility charge 1 Mobility   Received pt in bed having no complaints and agreeable to mobility. Pt was asymptomatic throughout ambulation and returned to room w/o fault. Left in bed w/ call bell in reach and all needs met.  Denhoff Wesson Stith Mobility Specialist Secure Chat Only

## 2021-11-06 NOTE — Progress Notes (Signed)
  [place] VALVE TEAM  Patient doing well s/p TAVR. He is hemodynamically stable. Groin sites are stable. ECG with no high grade block however new LBBB. He did have one episode of NSVT at 4 beats. Plan to DC arterial line and transfer to 4E.  Plan for early ambulation after bedrest completed and hopeful discharge over the next 24-48 hours.    Kathyrn Drown NP-C Structural Heart Team  Pager: [phone] Phone: [phone]

## 2021-11-06 NOTE — Anesthesia Procedure Notes (Signed)
Arterial Line Insertion Start/End10/10/2021 7:00 AM, 11/06/2021 7:15 AM Performed by: Leonor Liv, CRNA, CRNA  Patient location: Pre-op. Preanesthetic checklist: patient identified, IV checked, site marked, risks and benefits discussed, surgical consent, monitors and equipment checked, pre-op evaluation, timeout performed and anesthesia consent Right, radial was placed Catheter size: 20 G Hand hygiene performed  and maximum sterile barriers used  Allen's test indicative of satisfactory collateral circulation Attempts: 1 Procedure performed without using ultrasound guided technique. Following insertion, dressing applied and Biopatch. Patient tolerated the procedure well with no immediate complications. Additional procedure comments: Bruising to site .

## 2021-11-06 NOTE — Anesthesia Procedure Notes (Signed)
Procedure Name: MAC Date/Time: 11/06/2021 7:30 AM  Performed by: Leonor Liv, CRNAPre-anesthesia Checklist: Patient identified, Emergency Drugs available, Suction available, Patient being monitored and Timeout performed Patient Re-evaluated:Patient Re-evaluated prior to induction Oxygen Delivery Method: Simple face mask Placement Confirmation: positive ETCO2 Dental Injury: Teeth and Oropharynx as per pre-operative assessment

## 2021-11-06 NOTE — Interval H&P Note (Signed)
History and Physical Interval Note:  11/06/2021 6:33 AM  Jon Mejia  has presented today for surgery, with the diagnosis of Severe Aortic Stenosis.  The various methods of treatment have been discussed with the patient and family. After consideration of risks, benefits and other options for treatment, the patient has consented to  Procedure(s): Transcatheter Aortic Valve Replacement, Transfemoral (N/A) INTRAOPERATIVE TRANSTHORACIC ECHOCARDIOGRAM (N/A) as a surgical intervention.  The patient's history has been reviewed, patient examined, no change in status, stable for surgery.  I have reviewed the patient's chart and labs.  Questions were answered to the patient's satisfaction.     Coralie Common

## 2021-11-06 NOTE — Op Note (Addendum)
[facility] AND VASCULAR CENTER   MULTIDISCIPLINARY HEART VALVE TEAM   TAVR OPERATIVE NOTE   Date of Procedure:  11/06/2021  Preoperative Diagnosis: Severe Aortic Stenosis   Postoperative Diagnosis: Same   Procedure:   Transcatheter Aortic Valve Replacement - Percutaneous right Transfemoral Approach  Edwards Sapien 3 Ultra THV (size 26 mm, model # [id number] serial # [id number])   Co-Surgeons:  Coralie Common MD and  Lauree Chandler    Anesthesiologist:  Roderic Palau MD  Echocardiographer:  Gwen Pounds MD  Pre-operative Echo Findings: Severe aortic stenosis normal left ventricular systolic function  Post-operative Echo Findings: no paravalvular leak no left ventricular systolic function   BRIEF CLINICAL NOTE AND INDICATIONS FOR SURGERY   Severe Aortic Valve Stenosis  Pt with class I-2 NYHA symptoms and evidence of severe AS by echo which appears to be from low flow low gradient variety  His echo has evidence of thickened and poor mobility leaflets and should have improvement with an AVR. Pt with his age and multiple comorbidities would best be served with TAVR and  the risks and goals of this procedure were discussed with pt and family and they understand and wish to proceed.  The patient would have surgical bail out if need arises at time of TAVR    DETAILS OF THE OPERATIVE PROCEDURE  PREPARATION:    The patient was brought to the operating room on the above mentioned date and appropriate monitoring was established by the anesthesia team. The patient was placed in the supine position on the operating table.  Intravenous antibiotics were administered. The patient was monitored closely throughout the procedure under conscious sedation.  Baseline transthoracic echocardiogram was performed. The patient's abdomen and both groins were prepped and draped in a sterile manner. A time out procedure was performed.   PERIPHERAL ACCESS:    Using the modified  Seldinger technique, femoral arterial and venous access was obtained with placement of 6 Fr sheaths on the left side.  A pigtail diagnostic catheter was passed through the left arterial sheath under fluoroscopic guidance into the aortic root.  A temporary transvenous pacemaker catheter was passed through the left femoral venous sheath under fluoroscopic guidance into the right ventricle.  The pacemaker was tested to ensure stable lead placement and pacemaker capture. Aortic root angiography was performed in order to determine the optimal angiographic angle for valve deployment.   TRANSFEMORAL ACCESS:   Percutaneous transfemoral access and sheath placement was performed using ultrasound guidance.  The right common femoral artery was cannulated using a micropuncture needle and appropriate location was verified using hand injection angiogram.  A pair of Abbott Perclose percutaneous closure devices were placed and a 6 French sheath replaced into the femoral artery.  The patient was heparinized systemically and ACT verified > 250 seconds.    A 14 Fr transfemoral E-sheath was introduced into the right common femoral artery after progressively dilating over an Amplatz superstiff wire. An AL1 catheter was used to direct a straight-tip exchange length wire across the native aortic valve into the left ventricle. This was exchanged out for a pigtail catheter and position was confirmed in the LV apex. Simultaneous LV and Ao pressures were recorded.LVEDP was 41mHg  The pigtail catheter was exchanged for a Safari wire in the LV apex.   BALLOON AORTIC VALVULOPLASTY:   Not done  TRANSCATHETER HEART VALVE DEPLOYMENT:   An Edwards Sapien 3 Ultra transcatheter heart valve (size 26 mm) was prepared and crimped per manufacturer's guidelines, and the proper orientation of  the valve is confirmed on the [employer] delivery system. The valve was advanced through the introducer sheath using normal technique until in an  appropriate position in the abdominal aorta beyond the sheath tip. The balloon was then retracted and using the fine-tuning wheel was centered on the valve. The valve was then advanced across the aortic arch using appropriate flexion of the catheter. The valve was carefully positioned across the aortic valve annulus. The Commander catheter was retracted using normal technique. Once final position of the valve has been confirmed by angiographic assessment, the valve is deployed during rapid ventricular pacing to maintain systolic blood pressure < 50 mmHg and pulse pressure < 10 mmHg. The balloon inflation is held for >3 seconds after reaching full deployment volume. Once the balloon has fully deflated the balloon is retracted into the ascending aorta and valve function is assessed using echocardiography. There is felt to be no paravalvular leak and no central aortic insufficiency.  The patient's hemodynamic recovery following valve deployment is good.  The deployment balloon and guidewire are both removed.    PROCEDURE COMPLETION:   The sheath was removed and femoral artery closure performed.  Protamine was administered once femoral arterial repair was complete. The temporary pacemaker, pigtail catheter and femoral sheaths were removed with manual pressure used for venous hemostasis.  A Mynx femoral closure device was utilized following removal of the diagnostic sheath in the left femoral artery.  The patient tolerated the procedure well and is transported to the cath lab recovery area in stable condition. There were no immediate intraoperative complications. All sponge instrument and needle counts are verified correct at completion of the operation.   No blood products were administered during the operation.  The patient received a total of 40 mL of intravenous contrast during the procedure.   Coralie Common, MD 11/06/2021 9:35 AM

## 2021-11-06 NOTE — Progress Notes (Signed)
  Echocardiogram 2D Echocardiogram has been performed.  Jon Mejia 11/06/2021, 9:17 AM

## 2021-11-06 NOTE — Addendum Note (Signed)
Addendum  created 11/06/21 1204 by Leonor Liv, CRNA   Flowsheet accepted

## 2021-11-06 NOTE — Transfer of Care (Signed)
Immediate Anesthesia Transfer of Care Note  Patient: Jon Mejia  Procedure(s) Performed: Transcatheter Aortic Valve Replacement, Transfemoral (Chest) INTRAOPERATIVE TRANSTHORACIC ECHOCARDIOGRAM  Patient Location: Cath Lab  Anesthesia Type:MAC  Level of Consciousness: awake, alert  and oriented  Airway & Oxygen Therapy: Patient Spontanous Breathing and Patient connected to nasal cannula oxygen  Post-op Assessment: Report given to RN and Post -op Vital signs reviewed and stable  Post vital signs: Reviewed and stable  Last Vitals:  Vitals Value Taken Time  BP 92/47 11/06/21 0939  Temp 36.4 C 11/06/21 0943  Pulse 61 11/06/21 0940  Resp 19 11/06/21 0940  SpO2 96 % 11/06/21 0940  Vitals shown include unvalidated device data.  Last Pain:  Vitals:   11/06/21 0943  TempSrc: Temporal  PainSc: 0-No pain         Complications: No notable events documented.

## 2021-11-06 NOTE — Progress Notes (Signed)
Pt arrived from ..PACU..., A/ox .4..pt denies any pain, MD aware,CCMD called. CHG bath given,no further needs at this time   

## 2021-11-06 NOTE — Plan of Care (Signed)

## 2021-11-07 ENCOUNTER — Inpatient Hospital Stay (HOSPITAL_COMMUNITY): Payer: PPO

## 2021-11-07 ENCOUNTER — Encounter (HOSPITAL_COMMUNITY): Payer: Self-pay | Admitting: Cardiovascular Disease

## 2021-11-07 ENCOUNTER — Inpatient Hospital Stay (HOSPITAL_BASED_OUTPATIENT_CLINIC_OR_DEPARTMENT_OTHER)
Admit: 2021-11-07 | Discharge: 2021-11-07 | Disposition: A | Discharge status: Home / Self Care | Payer: PPO | Attending: Cardiology | Admitting: Cardiology

## 2021-11-07 DIAGNOSIS — I350 Nonrheumatic aortic (valve) stenosis: Principal | ICD-10-CM

## 2021-11-07 DIAGNOSIS — I447 Left bundle-branch block, unspecified: Secondary | ICD-10-CM

## 2021-11-07 DIAGNOSIS — Z952 Presence of prosthetic heart valve: Secondary | ICD-10-CM

## 2021-11-07 DIAGNOSIS — Z006 Encounter for examination for normal comparison and control in clinical research program: Secondary | ICD-10-CM | POA: Diagnosis not present

## 2021-11-07 DIAGNOSIS — I10 Essential (primary) hypertension: Secondary | ICD-10-CM | POA: Diagnosis not present

## 2021-11-07 DIAGNOSIS — E7800 Pure hypercholesterolemia, unspecified: Secondary | ICD-10-CM | POA: Diagnosis not present

## 2021-11-07 DIAGNOSIS — I480 Paroxysmal atrial fibrillation: Secondary | ICD-10-CM | POA: Diagnosis not present

## 2021-11-07 LAB — ECHOCARDIOGRAM LIMITED
AR max vel: 2.48 cm2
AV Area VTI: 2.5 cm2
AV Area mean vel: 2.6 cm2
AV Mean grad: 13 mmHg
AV Peak grad: 26 mmHg
Ao pk vel: 2.55 m/s
Height: 65 in
Weight: 2923.2 oz

## 2021-11-07 LAB — BASIC METABOLIC PANEL
Anion gap: 6 (ref 5–15)
BUN: 14 mg/dL (ref 8–23)
CO2: 22 mmol/L (ref 22–32)
Calcium: 8.8 mg/dL — ABNORMAL LOW (ref 8.9–10.3)
Chloride: 107 mmol/L (ref 98–111)
Creatinine, Ser: 0.94 mg/dL (ref 0.61–1.24)
GFR, Estimated: >60 mL/min (ref >60)
Glucose, Bld: 114 mg/dL — ABNORMAL HIGH (ref 70–99)
Potassium: 3.7 mmol/L (ref 3.5–5.1)
Sodium: 135 mmol/L (ref 135–145)

## 2021-11-07 LAB — CBC
HCT: 35.8 % — ABNORMAL LOW (ref 39.0–52.0)
Hemoglobin: 12.4 g/dL — ABNORMAL LOW (ref 13.0–17.0)
MCH: 30.2 pg (ref 26.0–34.0)
MCHC: 34.6 g/dL (ref 30.0–36.0)
MCV: 87.3 fL (ref 80.0–100.0)
Platelets: 189 10*3/uL (ref 150–400)
RBC: 4.1 MIL/uL — ABNORMAL LOW (ref 4.22–5.81)
RDW: 14 % (ref 11.5–15.5)
WBC: 9.7 10*3/uL (ref 4.0–10.5)
nRBC: 0 % (ref 0.0–0.2)

## 2021-11-07 LAB — MAGNESIUM: Magnesium: 1.8 mg/dL (ref 1.7–2.4)

## 2021-11-07 MED FILL — Potassium Chloride Inj 2 mEq/ML: INTRAVENOUS | Qty: 40 | Status: AC

## 2021-11-07 MED FILL — Magnesium Sulfate Inj 50%: INTRAMUSCULAR | Qty: 10 | Status: AC

## 2021-11-07 MED FILL — Heparin Sodium (Porcine) Inj 1000 Unit/ML: Qty: 1000 | Status: AC

## 2021-11-07 NOTE — Progress Notes (Signed)
Dr. Rockey Situ to read.

## 2021-11-07 NOTE — Progress Notes (Signed)
Echocardiogram 2D Echocardiogram has been performed.  Oneal Deputy Keionte Swicegood RDCS 11/07/2021, 8:21 AM

## 2021-11-07 NOTE — Plan of Care (Signed)
  Problem: Education: Goal: Will demonstrate proper wound care and an understanding of methods to prevent future damage Outcome: Adequate for Discharge Goal: Knowledge of disease or condition will improve Outcome: Adequate for Discharge Goal: Knowledge of the prescribed therapeutic regimen will improve Outcome: Adequate for Discharge Goal: Individualized Educational Video(s) Outcome: Adequate for Discharge   Problem: Activity: Goal: Risk for activity intolerance will decrease Outcome: Adequate for Discharge   Problem: Cardiac: Goal: Will achieve and/or maintain hemodynamic stability Outcome: Adequate for Discharge   Problem: Clinical Measurements: Goal: Postoperative complications will be avoided or minimized Outcome: Adequate for Discharge   Problem: Respiratory: Goal: Respiratory status will improve Outcome: Adequate for Discharge   Problem: Skin Integrity: Goal: Wound healing without signs and symptoms of infection Outcome: Adequate for Discharge Goal: Risk for impaired skin integrity will decrease Outcome: Adequate for Discharge   Problem: Urinary Elimination: Goal: Ability to achieve and maintain adequate renal perfusion and functioning will improve Outcome: Adequate for Discharge

## 2021-11-07 NOTE — Progress Notes (Signed)
CARDIAC REHAB PHASE I   PRE:  Rate/Rhythm: 77 SR/BBB  BP:  Sitting: 119/59      SaO2: 98 RA  MODE:  Ambulation: 470 ft   POST:  Rate/Rhythm: 75 SR/BBB   BP:  Sitting: 110/87      SaO2: 98 RA  Pt ambulated in hall independently with steady gait. Denied dizziness, pain or sob. Returned to room to bed with call bell and bedside table in reach. Post TAVR home education including risk factors, heart healthy diet, restrictions, exercise guidelines, site care and CRP2 reviewed with pt and family. All questions and concerns addressed. Will refer to [facility] for CRP2. Plan for discharge home today.   0900-1000   Vanessa Barbara, RN BSN 11/07/2021 9:51 AM

## 2021-11-08 ENCOUNTER — Telehealth: Payer: Self-pay | Admitting: Physician Assistant

## 2021-11-08 ENCOUNTER — Telehealth: Payer: Self-pay | Admitting: *Deleted

## 2021-11-08 DIAGNOSIS — I480 Paroxysmal atrial fibrillation: Secondary | ICD-10-CM | POA: Diagnosis not present

## 2021-11-08 DIAGNOSIS — Z952 Presence of prosthetic heart valve: Secondary | ICD-10-CM | POA: Diagnosis not present

## 2021-11-08 NOTE — Patient Outreach (Signed)
  Care Coordination [facility] Note Transition Care Management Follow-up Telephone Call Date of discharge and from where: [phone] [facility] How have you been since you were released from the hospital? I feel real good Any questions or concerns? Yes When can I take the bandage off. RN explained that can be removed within 48 hrs but be sure and put a band aid over it.  Items Reviewed: Did the pt receive and understand the discharge instructions provided? Yes  Medications obtained and verified? Yes  Other? No  Any new allergies since your discharge? No  Dietary orders reviewed? Yes Low sodium heart healthy diet Do you have support at home? Yes   Home Care and Equipment/Supplies: Were home health services ordered? no If so, what is the name of the agency? N   Has the agency set up a time to come to the patient's home? no Were any new equipment or medical supplies ordered?  No Zio monitor What is the name of the medical supply agency? [facility] cardiology Were you able to get the supplies/equipment? yes Do you have any questions related to the use of the equipment or supplies? No  Functional Questionnaire: (I = Independent and D = Dependent) ADLs: I  Bathing/Dressing-D  Meal Prep- D  Eating- I  Maintaining continence- I  Transferring/Ambulation- I  Managing Meds- I  Follow up appointments reviewed:  PCP Hospital f/u appt confirmed? No   Specialist Hospital f/u appt confirmed? Yes  Scheduled to see  on Angelena Form [id number] 1:30  Are transportation arrangements needed? No  If their condition worsens, is the pt aware to call PCP or go to the Emergency Dept.? Yes Was the patient provided with contact information for the PCP's office or ED? Yes Was to pt encouraged to call back with questions or concerns? Yes  SDOH assessments and interventions completed:   Yes  Care Coordination Interventions Activated:  Yes   Care Coordination Interventions:  No Care Coordination interventions needed at  this time.   Encounter Outcome:  Pt. Visit Completed   [place] Management [phone]

## 2021-11-08 NOTE — Patient Outreach (Signed)
  Care Coordination [facility] Note Transition Care Management Unsuccessful Follow-up Telephone Call  Date of discharge and from where: [facility] [phone]  Attempts:  1st Attempt  Reason for unsuccessful TCM follow-up call:  Left voice message  [place] [facility] Management [phone]

## 2021-11-08 NOTE — Telephone Encounter (Signed)
  [place] VALVE TEAM   Patient contacted regarding discharge from [facility] on 10/11  Patient understands to follow up with a structural heart APP on 10/18 at [place].  Patient understands discharge instructions? yes Patient understands medications and regimen? yes Patient understands to bring all medications to this visit? yes  [facility] Form PA-C  [facility]

## 2021-11-13 NOTE — Progress Notes (Signed)
[facility]   MULTIDISCIPLINARY HEART VALVE CLINIC                                     Cardiology Office Note:    Date:  11/14/2021   ID:  Jon Mejia, DOB 02-17-1940, MRN [id number]  PCP:  Malva Limes, MD  [facility] Cardiologist:  Julien Nordmann, MD / Dr. Clifton James, MD & Dr. Leafy Ro, MD (TAVR)   [facility] Electrophysiologist:  None   Referring MD: Malva Limes, MD   [facility] s/p TAVR  History of Present Illness:    Jon Mejia is a 81 y.o. 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   Jon Mejia is followed by Dr. Mariah Milling for his cardiology care. He had a stroke in 02/2018. He was found to have atrial fibrillation and Eliquis was started. He was recently seen by Dr. Mariah Milling and a murmur was detected on exam. Echocardiogram 03/20/21 showed normal LVEF, mild LVH, mild MR, moderate AS mean gradient 18.83mmHg, severe valve stenosis and he was referred to the Structural Heart team for further investigation of his aortic stenosis.    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 Normal right and left heart pressures. (PCWP 11, LVEDP 12-15). Medical management of non-obstructive CAD. Pre TAVR scans showed anatomy suitable to proceed.    He was evaluated by the multidisciplinary valve team and underwent successful TAVR with a 26 mm Edwards Sapien 3 THV via the TF approach on 11/06/21. 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 He was continued on Eliquis monotherapy. ECG showed new LBBB and he was discharged with a Zio monitor.   Today the patient presents to clinic for follow up. Here with daughter and wife. A little "sluggish" but otherwise doing well. No CP or SOB. No LE edema, orthopnea or PND. No dizziness or  syncope. No blood in stool or urine. No palpitations.     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 Clifton James and Dr. Leafy Ro    Past Surgical History:  Procedure Laterality Date   INTRAOPERATIVE TRANSTHORACIC ECHOCARDIOGRAM N/A 11/06/2021   Procedure: INTRAOPERATIVE TRANSTHORACIC ECHOCARDIOGRAM;  Surgeon: Kathleene Hazel, MD;  Location: [facility] OR;  Service: Open Heart Surgery;  Laterality: N/A;   RIGHT/LEFT HEART CATH AND CORONARY ANGIOGRAPHY N/A 09/17/2021   Procedure: RIGHT/LEFT HEART CATH AND CORONARY ANGIOGRAPHY;  Surgeon: Kathleene Hazel, MD;  Location: [facility] INVASIVE CV LAB;  Service: Cardiovascular;  Laterality: N/A;   SKIN CANCER EXCISION  07/2020   TRANSCATHETER AORTIC VALVE REPLACEMENT, TRANSFEMORAL N/A 11/06/2021   Procedure: Transcatheter Aortic Valve Replacement, Transfemoral;  Surgeon: Kathleene Hazel, MD;  Location: [facility] OR;  Service: Open Heart Surgery;  Laterality: N/A;    Current Medications: Current Meds  Medication Sig   amLODipine (NORVASC) 5 MG tablet TAKE 1  TABLET BY MOUTH DAILY   amoxicillin (AMOXIL) 500 MG tablet Take 4 tablets (2,000 mg total) by mouth as directed. 1 hour prior to dental work including cleanings   atorvastatin (LIPITOR) 40 MG tablet TAKE ONE TABLET EVERY DAY   Cyanocobalamin (B-12) 3000 MCG CAPS Take 3,000 mcg by mouth daily as needed (energy).   ELIQUIS 5 MG TABS tablet TAKE ONE (1) TABLET BY MOUTH TWO TIMES PER DAY   metoprolol succinate (TOPROL-XL) 25 MG 24 hr tablet TAKE 1 TABLET BY MOUTH DAILY WITH OR IMMEDIATELY FOLLOWING A MEAL.     Allergies:   Patient has no known allergies.   Social History   Socioeconomic History    Marital status: Married    Spouse name: [name]   Number of children: 2   Years of education: Not on file   Highest education level: 12th grade  Occupational History   Occupation: Retired   Occupation: Retired [employer]  Tobacco Use   Smoking status: Former    Packs/day: 1.00    Years: 20.00    Total pack years: 20.00    Types: Cigarettes    Quit date: 03/12/1983    Years since quitting: 38.7   Smokeless tobacco: Never  Vaping Use   Vaping Use: Never used  Substance and Sexual Activity   Alcohol use: No   Drug use: No   Sexual activity: Not on file  Other Topics Concern   Not on file  Social History Narrative   Not on file   Social Determinants of Health   Financial Resource Strain: Low Risk  (08/25/2019)   Overall Financial Resource Strain (CARDIA)    Difficulty of Paying Living Expenses: Not hard at all  Food Insecurity: No Food Insecurity (11/06/2021)   Hunger Vital Sign    Worried About Running Out of Food in the Last Year: Never true    Ran Out of Food in the Last Year: Never true  Transportation Needs: No Transportation Needs (11/06/2021)   PRAPARE - Administrator, Civil Service (Medical): No    Lack of Transportation (Non-Medical): No  Physical Activity: Inactive (08/25/2019)   Exercise Vital Sign    Days of Exercise per Week: 0 days    Minutes of Exercise per Session: 0 min  Stress: No Stress Concern Present (08/25/2019)   [employer] of Occupational Health - Occupational Stress Questionnaire    Feeling of Stress : Not at all  Social Connections: Moderately Integrated (08/25/2019)   Social Connection and Isolation Panel [NHANES]    Frequency of Communication with Friends and Family: Three times a week    Frequency of Social Gatherings with Friends and Family: More than three times a week    Attends Religious Services: More than 4 times per year    Active Member of [employer] or Organizations: No    Attends Banker Meetings:  Never    Marital Status: Married     Family History: The patient's family history includes Arrhythmia in his mother; Diabetes in his son; Lung cancer in his father. There is no history of Colon cancer or Prostate cancer.  ROS:   Please see the history of present illness.    All other systems reviewed and are negative.  EKGs/Labs/Other Studies Reviewed:    The following studies were reviewed today:  TAVR OPERATIVE NOTE     Date of Procedure:                11/06/2021   Preoperative  Diagnosis:      Severe Aortic Stenosis    Postoperative Diagnosis:    Same    Procedure:        Transcatheter Aortic Valve Replacement - Percutaneous right Transfemoral Approach             Edwards Sapien 3 Ultra THV (size 26 mm, model # 9755RSL serial # [id number])              Co-Surgeons:                        Eugenio Hoes MD and  Verne Carrow      Anesthesiologist:                  Gaynelle Adu MD   Echocardiographer:              Debera Lat MD   Pre-operative Echo Findings: Severe aortic stenosis normal left ventricular systolic function   Post-operative Echo Findings: no paravalvular leak no left ventricular systolic function  _____________   Echo 11/07/21:  IMPRESSIONS   1. The aortic valve has been replaced by a 26 mm Sapien Valve. Aortic  valve regurgitation is trivial (PVL, PSAX 3 o'clock). Effective orifice  area, by VTI measures 2.50 cm. Aortic valve mean gradient measures 13.0  mmHg. Peak gradient 26 mm Hg. DVI  0.47.   2. Left ventricular ejection fraction, by estimation, is 60 to 65%. The  left ventricle has normal function. The left ventricle has no regional  wall motion abnormalities.   3. Right ventricular systolic function is normal. The right ventricular  size is normal. Tricuspid regurgitation signal is inadequate for assessing  PA pressure.   4. The mitral valve is degenerative. No evidence of mitral valve  regurgitation. Severe mitral  annular calcification.   Comparison(s): Echo findings are consistent with normal structure and  function of the aortic valve prosthesis. PVL not see on 11/06/21 study.    EKG:  EKG is ordered today.  The ekg ordered today demonstrates sinus with LBBB, HR 66  Recent Labs: 11/02/2021: ALT 16 11/07/2021: BUN 14; Creatinine, Ser 0.94; Hemoglobin 12.4; Magnesium 1.8; Platelets 189; Potassium 3.7; Sodium 135  Recent Lipid Panel    Component Value Date/Time   CHOL 148 09/05/2021 1529   TRIG 154 (H) 09/05/2021 1529   HDL 39 (L) 09/05/2021 1529   CHOLHDL 3.8 09/05/2021 1529   CHOLHDL 4.5 03/14/2018 0711   VLDL 18 03/14/2018 0711   LDLCALC 82 09/05/2021 1529   LDLCALC 119 (H) 01/09/2017 0906     Risk Assessment/Calculations:    CHA2DS2-VASc Score = 6   This indicates a 9.7% annual risk of stroke. The patient's score is based upon: CHF History: 1 HTN History: 1 Diabetes History: 0 Stroke History: 2 Vascular Disease History: 0 Age Score: 2 Gender Score: 0      Physical Exam:    VS:  BP 128/68   Pulse 66   Ht 5\' 5"  (1.651 m)   Wt 182 lb 3.2 oz (82.6 kg)   SpO2 96%   BMI 30.32 kg/m     Wt Readings from Last 3 Encounters:  11/14/21 182 lb 3.2 oz (82.6 kg)  11/07/21 182 lb 11.2 oz (82.9 kg)  11/02/21 182 lb (82.6 kg)     GEN:  Well nourished, well developed in no acute distress HEENT: Normal NECK: No JVD LYMPHATICS: No lymphadenopathy CARDIAC: RRR, no murmurs, rubs, gallops RESPIRATORY:  Clear to auscultation without  rales, wheezing or rhonchi  ABDOMEN: Soft, non-tender, non-distended MUSCULOSKELETAL:  No edema; No deformity  SKIN: Warm and dry.  Groin sites clear without hematoma or ecchymosis  NEUROLOGIC:  Alert and oriented x 3 PSYCHIATRIC:  Normal affect   ASSESSMENT:    1. S/P TAVR (transcatheter aortic valve replacement)   2. New onset left bundle branch block (LBBB)   3. PAF (paroxysmal atrial fibrillation) (HCC)   4. History of CVA (cerebrovascular  accident)   5. Primary hypertension   6. Gallbladder sludge    PLAN:    In order of problems listed above:  Severe AS s/p TAVR: doing great 1 week out from TAVR. ECG with no HAVB. Groin sites healing well. SBE prophylaxis discussed; I have RX'd amoxicillin. Continue on Eliquis 5mg  BID. I will see him back for 1 month follow up and echo.    New LBBB: stable by ECG today. Zio with no high risk alerts.    Paroxsymal atrial fibrillation: maintaining sinus today. Continue Eliquis .     Hx of CVA: continue Eliquis and statin   HTN: BP well controlled today. No changes made   HLD: continue statin   Gallbladder sludge: pre TAVR CT showed multiple soft tissue lesions are seen in the gallbladder which are likely gallbladder polyps, largest measures 1.3 cm. Follow up US 10/16/21 showed gallbladder sludge and stones with no sonographic evidence of acute cholecystitis. Dr. Leafy Ro discussed w/pt possibly needing cholesytectomy. He is not having any GI symptoms.      Cardiac Rehabilitation Eligibility Assessment  The patient is ready to start cardiac rehabilitation from a cardiac standpoint.     Medication Adjustments/Labs and Tests Ordered: Current medicines are reviewed at length with the patient today.  Concerns regarding medicines are outlined above.  Orders Placed This Encounter  Procedures   EKG 12-Lead   Meds ordered this encounter  Medications   amoxicillin (AMOXIL) 500 MG tablet    Sig: Take 4 tablets (2,000 mg total) by mouth as directed. 1 hour prior to dental work including cleanings    Dispense:  12 tablet    Refill:  12    Order Specific Question:   Supervising Provider    Answer:   Tonny Bollman [[id number]]    Patient Instructions  Medication Instructions:  NO CHANGES *If you need a refill on your cardiac medications before your next appointment, please call your pharmacy*   Lab Work: NONE If you have labs (blood work) drawn today and your tests are completely  normal, you will receive your results only by: MyChart Message (if you have MyChart) OR A paper copy in the mail If you have any lab test that is abnormal or we need to change your treatment, we will call you to review the results.   Testing/Procedures: NONE   Follow-Up: At [facility], you and your health needs are our priority.  As part of our continuing mission to provide you with exceptional heart care, we have created designated Provider Care Teams.  These Care Teams include your primary Cardiologist (physician) and Advanced Practice Providers (APPs -  Physician Assistants and Nurse Practitioners) who all work together to provide you with the care you need, when you need it.  We recommend signing up for the patient portal called "MyChart".  Sign up information is provided on this After Visit Summary.  MyChart is used to connect with patients for Virtual Visits (Telemedicine).  Patients are able to view lab/test results, encounter notes, upcoming appointments, etc.  Non-urgent messages can be sent to your provider as well.   To learn more about what you can do with MyChart, go to [email or web address].    Your next appointment:  AS PLANNED  The format for your next appointment:     Provider:       Other Instructions ANTIBIOTIC AS DIRECTED   Important Information About Sugar         Signed, Cline Crock, PA-C  11/14/2021 2:59 PM    [facility]

## 2021-11-14 ENCOUNTER — Ambulatory Visit: Payer: PPO | Attending: Physician Assistant | Admitting: Physician Assistant

## 2021-11-14 VITALS — BP 128/68 | HR 66 | Ht 65.0 in | Wt 182.2 lb

## 2021-11-14 DIAGNOSIS — I447 Left bundle-branch block, unspecified: Secondary | ICD-10-CM

## 2021-11-14 DIAGNOSIS — Z952 Presence of prosthetic heart valve: Secondary | ICD-10-CM | POA: Diagnosis not present

## 2021-11-14 DIAGNOSIS — I480 Paroxysmal atrial fibrillation: Secondary | ICD-10-CM | POA: Diagnosis not present

## 2021-11-14 DIAGNOSIS — I10 Essential (primary) hypertension: Secondary | ICD-10-CM

## 2021-11-14 DIAGNOSIS — Z8673 Personal history of transient ischemic attack (TIA), and cerebral infarction without residual deficits: Secondary | ICD-10-CM

## 2021-11-14 DIAGNOSIS — K828 Other specified diseases of gallbladder: Secondary | ICD-10-CM

## 2021-11-14 MED ORDER — AMOXICILLIN 500 MG PO TABS: 2000.0000 mg | ORAL_TABLET | ORAL | 12 refills | Status: DC

## 2021-11-14 NOTE — Patient Instructions (Signed)
Medication Instructions:  NO CHANGES *If you need a refill on your cardiac medications before your next appointment, please call your pharmacy*   Lab Work: NONE If you have labs (blood work) drawn today and your tests are completely normal, you will receive your results only by: [place] (if you have MyChart) OR A paper copy in the mail If you have any lab test that is abnormal or we need to change your treatment, we will call you to review the results.   Testing/Procedures: NONE   Follow-Up: At [facility], you and your health needs are our priority.  As part of our continuing mission to provide you with exceptional heart care, we have created designated Provider Care Teams.  These Care Teams include your primary Cardiologist (physician) and Advanced Practice Providers (APPs -  Physician Assistants and Nurse Practitioners) who all work together to provide you with the care you need, when you need it.  We recommend signing up for the patient portal called "MyChart".  Sign up information is provided on this After Visit Summary.  MyChart is used to connect with patients for Virtual Visits (Telemedicine).  Patients are able to view lab/test results, encounter notes, upcoming appointments, etc.  Non-urgent messages can be sent to your provider as well.   To learn more about what you can do with MyChart, go to [email or web address].    Your next appointment:  AS PLANNED  The format for your next appointment:     Provider:       Other Instructions ANTIBIOTIC AS DIRECTED   Important Information About Sugar

## 2021-11-26 ENCOUNTER — Other Ambulatory Visit: Payer: Self-pay | Admitting: Family Medicine

## 2021-11-30 NOTE — Addendum Note (Signed)
Encounter addended by: Gerarda Gunther on: 11/30/2021 9:21 AM  Actions taken: Imaging Exam ended

## 2021-12-04 NOTE — Progress Notes (Signed)
HE[facility] AND [place]                                     Cardiology Office Note:    Date:  12/07/2021   ID:  Jon Mejia, DOB 1940/02/15, MRN [id number]  PCP:  [name], MD  [facility] Cardiologist:  Ida Rogue, MD /Dr. Angelena Form, MD & Dr. Lavonna Monarch, MD (TAVR)    [facility] Electrophysiologist:  None   Referring MD: [name], MD    Chief Complaint  Patient presents with   Follow-up    1 month s/p TAVR   History of Present Illness:    Jon Mejia is a 81 y.o. 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    Jon Mejia is followed by Dr. Rockey Situ for his cardiology care. He had a stroke in 02/2018. He was found to have atrial fibrillation and Eliquis was started. He was recently seen by Dr. Rockey Situ and a murmur was detected on exam. Echocardiogram 03/20/21 showed normal LVEF, mild LVH, mild MR, moderate AS mean gradient 18.19mHg, severe valve stenosis and he was referred to the Structural Heart team for further investigation of his aortic stenosis.    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 Normal right and left heart pressures. (PCWP 11, LVEDP 12-15). Medical management of non-obstructive CAD. Pre TAVR scans showed anatomy suitable to proceed.    He was evaluated by the multidisciplinary valve team and underwent successful TAVR with a 26 mm Edwards Sapien 3 THV via the TF approach on 11/06/21. 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 He was continued on Eliquis monotherapy. ECG showed new LBBB and he was discharged with a Zio monitor.   [facility] still had some mild fatigue but was otherwise doing well with no issues. He is here today with  his daughter and wife. He has no specific complaints today including chest pain, palpitations, LE edema, orthopnea, bleeding, dizziness, or syncope. He is getting back to his normal activities around the house. He still is napping some in the day but feels this is age related.   ZIO results with no acute arrhythmias however there was infrequent episodes of SVT, PACs, and PVCs. This was discussed during our visit.    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 McAngelena Formnd Dr. WeLavonna Monarch  Past Surgical History:  Procedure Laterality Date   INTRAOPERATIVE TRANSTHORACIC ECHOCARDIOGRAM N/A 11/06/2021   Procedure: INTRAOPERATIVE TRANSTHORACIC ECHOCARDIOGRAM;  Surgeon: McBurnell BlanksMD;  [place] Service: Open Heart Surgery;  Laterality: N/A;   RIGHT/LEFT HEART CATH AND CORONARY ANGIOGRAPHY N/A 09/17/2021   Procedure: RIGHT/LEFT HEART CATH AND CORONARY ANGIOGRAPHY;  Surgeon: McBurnell BlanksMD;  [place]  [facility];  Service: Cardiovascular;  Laterality: N/A;   SKIN CANCER EXCISION  07/2020   TRANSCATHETER AORTIC VALVE REPLACEMENT, TRANSFEMORAL N/A 11/06/2021   Procedure: Transcatheter Aortic Valve Replacement, Transfemoral;  Surgeon: Burnell Blanks, MD;  Location: [place];  Service: Open Heart Surgery;  Laterality: N/A;    Current Medications: Current Meds  Medication Sig   amLODipine (NORVASC) 5 MG tablet TAKE 1 TABLET BY MOUTH DAILY   amoxicillin (AMOXIL) 500 MG tablet Take 4 tablets (2,000 mg total) by mouth as directed. 1 hour prior to dental work including cleanings   atorvastatin (LIPITOR) 40 MG tablet TAKE 1 TABLET BY MOUTH DAILY   Cyanocobalamin  (B-12) 3000 MCG CAPS Take 3,000 mcg by mouth daily as needed (energy).   ELIQUIS 5 MG TABS tablet TAKE ONE (1) TABLET BY MOUTH TWO TIMES PER DAY   metoprolol succinate (TOPROL XL) 25 MG 24 hr tablet Take 1.5 tablets (37.5 mg total) by mouth daily.   [DISCONTINUED] metoprolol succinate (TOPROL-XL) 25 MG 24 hr tablet TAKE 1 TABLET BY MOUTH DAILY WITH OR IMMEDIATELY FOLLOWING A MEAL.     Allergies:   Patient has no known allergies.   Social History   Socioeconomic History   Marital status: Married    Spouse name: [place]   Number of children: 2   Years of education: Not on file   Highest education level: 12th grade  Occupational History   Occupation: Retired   Occupation: Retired [employer]  Tobacco Use   Smoking status: Former    Packs/day: 1.00    Years: 20.00    Total pack years: 20.00    Types: Cigarettes    Quit date: 03/12/1983    Years since quitting: 38.7   Smokeless tobacco: Never  Vaping Use   Vaping Use: Never used  Substance and Sexual Activity   Alcohol use: No   Drug use: No   Sexual activity: Not on file  Other Topics Concern   Not on file  Social History Narrative   Not on file   Social Determinants of Health   Financial Resource Strain: Low Risk  (08/25/2019)   Overall Financial Resource Strain (CARDIA)    Difficulty of Paying Living Expenses: Not hard at all  Food Insecurity: No Food Insecurity (11/06/2021)   Hunger Vital Sign    Worried About Running Out of Food in the Last Year: Never true    [place] in the Last Year: Never true  Transportation Needs: No Transportation Needs (11/06/2021)   PRAPARE - Hydrologist (Medical): No    Lack of Transportation (Non-Medical): No  Physical Activity: Inactive (08/25/2019)   Exercise Vital Sign    Days of Exercise per Week: 0 days    Minutes of Exercise per Session: 0 min  Stress: No Stress Concern Present (08/25/2019)   [place]    Feeling of Stress : Not at all  Social Connections: Moderately Integrated (08/25/2019)   Social Connection and Isolation Panel [NHANES]    Frequency of Communication with Friends and Family: Three times a week    Frequency of Social Gatherings with Friends and Family: More than three times a week    Attends Religious Services: More than 4 times per year    Active Member of [employer] or Organizations: No    Attends Archivist Meetings: Never    Marital Status: Married     Family History: The patient's  family history includes Arrhythmia in his mother; Diabetes in his son; Lung cancer in his father. There is no history of Colon cancer or Prostate cancer.  ROS:   Please see the history of present illness.    All other systems reviewed and are negative.  EKGs/Labs/Other Studies Reviewed:    The following studies were reviewed today:  Echocardiogram 12/05/21:  1. S/P TAVR with mean gradient 7 mmHg; compared to 11/07/21 trivial AI  not evident on present study.   2. Left ventricular ejection fraction, by estimation, is 60 to 65%. The  left ventricle has normal function. The left ventricle has no regional  wall motion abnormalities. Left ventricular diastolic parameters are  consistent with Grade I diastolic  dysfunction (impaired relaxation). Elevated left atrial pressure.   3. Right ventricular systolic function is normal. The right ventricular  size is normal.   4. The mitral valve is normal in structure. Mild mitral valve  regurgitation. No evidence of mitral stenosis. Moderate mitral annular  calcification.   5. The aortic valve has been repaired/replaced. Aortic valve  regurgitation is not visualized. No aortic stenosis is present. There is a  26 mm Sapien prosthetic (TAVR) valve present in the aortic position.  Procedure Date: 11/06/21. Echo findings are  consistent with normal structure and function of the aortic valve  prosthesis.    6. The inferior vena cava is normal in size with greater than 50%  respiratory variability, suggesting right atrial pressure of 3 mmHg.    TAVR OPERATIVE NOTE     Date of Procedure:                11/06/2021   Preoperative Diagnosis:      Severe Aortic Stenosis    Postoperative Diagnosis:    Same    Procedure:        Transcatheter Aortic Valve Replacement - Percutaneous right Transfemoral Approach             Edwards Sapien 3 Ultra THV (size 26 mm, model # 9755RSL serial # [id number])              Co-Surgeons:                        Coralie Common MD and  Lauree Chandler      Anesthesiologist:                  Roderic Palau MD   Echocardiographer:              Gwen Pounds MD   Pre-operative Echo Findings: Severe aortic stenosis normal left ventricular systolic function   Post-operative Echo Findings: no paravalvular leak no left ventricular systolic function   _____________   Echo 11/07/21:  IMPRESSIONS   1. The aortic valve has been replaced by a 26 mm Sapien Valve. Aortic  valve regurgitation is trivial (PVL, PSAX 3 o'clock). Effective orifice  area, by VTI measures 2.50 cm. Aortic valve mean gradient measures 13.0  mmHg. Peak gradient 26 mm Hg. DVI  0.47.   2. Left ventricular ejection fraction, by estimation, is 60 to 65%. The  left ventricle has normal function. The left ventricle has no regional  wall motion abnormalities.   3. Right ventricular systolic function is normal. The right ventricular  size is normal. Tricuspid regurgitation signal is inadequate for assessing  PA pressure.   4. The mitral valve is degenerative. No evidence of mitral valve  regurgitation. Severe mitral annular calcification.  Comparison(s): Echo findings are consistent with normal structure and  function of the aortic valve prosthesis. PVL not see on 11/06/21 study.    EKG:  EKG is not ordered today.    Recent Labs: 11/02/2021: ALT 16 11/07/2021: BUN 14;  Creatinine, Ser 0.94; Hemoglobin 12.4; Magnesium 1.8; Platelets 189; Potassium 3.7; Sodium 135   Recent Lipid Panel    Component Value Date/Time   CHOL 148 09/05/2021 1529   TRIG 154 (H) 09/05/2021 1529   HDL 39 (L) 09/05/2021 1529   CHOLHDL 3.8 09/05/2021 1529   CHOLHDL 4.5 03/14/2018 0711   VLDL 18 03/14/2018 0711   LDLCALC 82 09/05/2021 1529   LDLCALC 119 (H) 01/09/2017 0906   Physical Exam:    VS:  BP (!) 140/70   Pulse 65   Ht _0  (1.651 m)   Wt 183 lb (83 kg)   SpO2 95%   BMI 30.45 kg/m     Wt Readings from Last 3 Encounters:  12/05/21 183 lb (83 kg)  11/14/21 182 lb 3.2 oz (82.6 kg)  11/07/21 182 lb 11.2 oz (82.9 kg)    General: Well developed, well nourished, NAD Lungs:Clear to ausculation bilaterally. No wheezes, rales, or rhonchi. Breathing is unlabored. Cardiovascular: RRR with S1 S2. No murmurs Extremities: No edema.  Neuro: Alert and oriented. No focal deficits. No facial asymmetry. MAE spontaneously. Psych: Responds to questions appropriately with normal affect.    ASSESSMENT/PLAN:    Severe AS s/p TAVR: Patient with NYHA class I symptoms s/p TAVR. Feeling well today with no new issues. Echo today with stable valve function with no AI (previously present on post op echo) with a mean gradient at 68mHg, peak at 14.69mg. Continue Eliquis 21m18mID. No bleeding issues noted. Continue with lifelong dental SBE. Plan follow up with primary cardiology team and us [place] one year with echocardiogram.    New LBBB: ZIO placed at hospital discharge which resulted with  no acute arrhythmias however there was infrequent episodes of SVT, PACs, and PVCs. There was evidence of 1st degree AV block, LBBB (not new). Increase metoprolol from 221m32m 37.21mg.86m Paroxsymal atrial fibrillation: Continue Eliquis.    Hx of CVA: Continue Eliquis and statin    HTN: BP well controlled today. No changes at this time   HLD: Continue statin   Gallbladder sludge: pre TAVR CT showed  multiple soft tissue lesions are seen in the gallbladder which are likely gallbladder polyps, largest measures 1.3 cm. Follow up US 9/[place]/23 showed gallbladder sludge and stones with no sonographic evidence of acute cholecystitis. Dr. WeldnLavonna Monarchussed w/pt possibly needing cholesytectomy. He continue to not have any GI symptoms.     Medication Adjustments/Labs and Tests Ordered: Current medicines are reviewed at length with the patient today.  Concerns regarding medicines are outlined above.  No orders of the defined types were placed in this encounter.  Meds ordered this encounter  Medications   metoprolol succinate (TOPROL XL) 25 MG 24 hr tablet    Sig: Take 1.5 tablets (37.5 mg total) by mouth daily.    Dispense:  135 tablet    Refill:  3    Patient Instructions  Medication Instructions:  Your physician has recommended you make the following change in your medication:   Increase metoprolol to 37.5 mg (1 and 1/2 tabs)daily   *If you need a refill on your cardiac medications before your next appointment, please call your pharmacy*   Follow-Up: At [facility] and your  health needs are our priority.  As part of our continuing mission to provide you with exceptional heart care, we have created designated Provider Care Teams.  These Care Teams include your primary Cardiologist (physician) and Advanced Practice Providers (APPs -  Physician Assistants and Nurse Practitioners) who all work together to provide you with the care you need, when you need it.  Your next appointment:   As Scheduled with Kathyrn Drown             Signed, Kathyrn Drown, NP  12/07/2021 1:58 PM    [facility]

## 2021-12-05 ENCOUNTER — Ambulatory Visit: Payer: PPO | Admitting: Cardiology

## 2021-12-05 ENCOUNTER — Ambulatory Visit (HOSPITAL_COMMUNITY): Payer: PPO | Attending: Cardiology

## 2021-12-05 ENCOUNTER — Other Ambulatory Visit: Payer: Self-pay | Admitting: Cardiology

## 2021-12-05 VITALS — BP 140/70 | HR 65 | Ht 65.0 in | Wt 183.0 lb

## 2021-12-05 DIAGNOSIS — I350 Nonrheumatic aortic (valve) stenosis: Secondary | ICD-10-CM

## 2021-12-05 DIAGNOSIS — Z8673 Personal history of transient ischemic attack (TIA), and cerebral infarction without residual deficits: Secondary | ICD-10-CM | POA: Diagnosis not present

## 2021-12-05 DIAGNOSIS — I480 Paroxysmal atrial fibrillation: Secondary | ICD-10-CM | POA: Insufficient documentation

## 2021-12-05 DIAGNOSIS — Z952 Presence of prosthetic heart valve: Secondary | ICD-10-CM

## 2021-12-05 DIAGNOSIS — I447 Left bundle-branch block, unspecified: Secondary | ICD-10-CM | POA: Insufficient documentation

## 2021-12-05 DIAGNOSIS — I491 Atrial premature depolarization: Secondary | ICD-10-CM

## 2021-12-05 DIAGNOSIS — K828 Other specified diseases of gallbladder: Secondary | ICD-10-CM | POA: Insufficient documentation

## 2021-12-05 LAB — ECHOCARDIOGRAM COMPLETE
AV Mean grad: 8 mmHg
AV Peak grad: 14.6 mmHg
Ao pk vel: 1.91 m/s
Area-P 1/2: 3.21 cm2
S' Lateral: 3.1 cm

## 2021-12-05 MED ORDER — METOPROLOL SUCCINATE ER 25 MG PO TB24: 37.5000 mg | ORAL_TABLET | Freq: Every day | ORAL | 3 refills | Status: DC

## 2021-12-05 NOTE — Patient Instructions (Addendum)
Medication Instructions:  Your physician has recommended you make the following change in your medication:   Increase metoprolol to 37.5 mg (1 and 1/2 tabs)daily   *If you need a refill on your cardiac medications before your next appointment, please call your pharmacy*   Follow-Up: At [facility], you and your health needs are our priority.  As part of our continuing mission to provide you with exceptional heart care, we have created designated Provider Care Teams.  These Care Teams include your primary Cardiologist (physician) and Advanced Practice Providers (APPs -  Physician Assistants and Nurse Practitioners) who all work together to provide you with the care you need, when you need it.  Your next appointment:   As Scheduled with Kathyrn Drown

## 2021-12-22 NOTE — Progress Notes (Signed)
Cardiology Office Note  Date:  12/24/2021   ID:  Jon Mejia, DOB: 09/15/1940, MRN: [id number]  PCP:  [name], MD   Chief Complaint  Patient presents with   4 month follow up     Patient c/o shortness of breath with over exertion. Medications reviewed by the patient verbally.     HPI:  Jon Mejia is a 81 y.o. male who has a PMHx of: Stroke Severe  aortic valve stenosis, s/p TAVR Pure hypercholesterolemia HTN Premature atrial beats quit smoking about 35 years ago. His smoking use included cigarettes. He has a 20.00 pack-year smoking history. Cardiac catheterization August 2023 moderate three-vessel disease Who presents for f/u of his  stroke , PAD, paroxysmal atrial fib, TAVR  Last seen in clinic 2022  S/p TAVR November 06, 2021 Follow-up studies reviewed Echocardiogram December 05, 2021 Normal ejection fraction Status post TAVR with mean gradient 7 mm Hg 26 mm SAPIEN prosthetic TAVR valve  Still with SOB,  Some dizzy spells when bending over and bending back up right  BP at home: 120 to 130/60, metoprolol in the morning amlodipine in the evening Poor sleep in general, feels tired No regular exercise program  Previously he working on the farm, in the garden, not recently  Denies any chest pain on exertion No tachycardia concerning for arrhythmia  Tolerating Eliquis twice daily  Prior studies reviewed Lab work reviewed HGBA1C 5.9 Total chol 169 CR 1.03  EKG personally reviewed by myself on todays visit Normal sinus rhythm rate 63 bpm no significant ST-T wave changes  Past medical history reviewed 02/2018  scheduled for TEE and placement of loop monitor This was cancelled after noting he had run of atrial fib this AM on telemetry Started on eliquis 5 BID  EKG personally reviewed by myself on todays visit Shows normal sinus rhythm rate 69 bpm no significant ST-T wave changes  Other past medical history reviewed ED visit on 03/13/2018 for sudden  onset decreased vision while working outside around 2pm. He initially had complete vision loss, but eventually was just seeing shadows. Never happened before. He denied numbness, tingling, or focal weakness. He denies headache. His wife states he has been tired recently and last night was feeling dizzy. Code stroke called on presentation and patient taken given alteplase.    He was diagnosed with acute stroke symptoms and was admitted to ICU per neurology for monitoring. The patient was discharged 03/15/2018.   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 Punctate acute infarct in the posterior right frontal lobe. 2. Small chronic cerebellar infarct.  CT neck on 03/13/2018 showed: 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 On the right, there is soft and calcified plaque. Maximal stenosis in the right ICA bulb is 30%. No measurable stenosis on the left.    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  PSH:    Past Surgical History:  Procedure Laterality Date   INTRAOPERATIVE TRANSTHORACIC ECHOCARDIOGRAM N/A 11/06/2021   Procedure: INTRAOPERATIVE TRANSTHORACIC ECHOCARDIOGRAM;  Surgeon: Burnell Blanks, MD;  Location: [place];  Service: Open Heart Surgery;  Laterality: N/A;   RIGHT/LEFT HEART CATH AND CORONARY ANGIOGRAPHY N/A 09/17/2021   Procedure: RIGHT/LEFT HEART CATH AND CORONARY  ANGIOGRAPHY;  Surgeon: Burnell Blanks, MD;  Location: [place] [facility];  Service: Cardiovascular;  Laterality: N/A;   SKIN CANCER EXCISION  07/2020   TRANSCATHETER AORTIC VALVE REPLACEMENT, TRANSFEMORAL N/A 11/06/2021   Procedure:  Transcatheter Aortic Valve Replacement, Transfemoral;  Surgeon: Burnell Blanks, MD;  Location: [place];  Service: Open Heart Surgery;  Laterality: N/A;    Current Outpatient Medications  Medication Sig Dispense Refill   amLODipine (NORVASC) 5 MG tablet TAKE 1 TABLET BY MOUTH DAILY 90 tablet 0   atorvastatin (LIPITOR) 40 MG tablet TAKE 1 TABLET BY MOUTH DAILY 90 tablet 4   Cyanocobalamin (B-12) 3000 MCG CAPS Take 3,000 mcg by mouth daily as needed (energy).     ELIQUIS 5 MG TABS tablet TAKE ONE (1) TABLET BY MOUTH TWO TIMES PER DAY 180 tablet 1   metoprolol succinate (TOPROL XL) 25 MG 24 hr tablet Take 1.5 tablets (37.5 mg total) by mouth daily. 135 tablet 3   amoxicillin (AMOXIL) 500 MG tablet Take 4 tablets (2,000 mg total) by mouth as directed. 1 hour prior to dental work including cleanings (Patient not taking: Reported on 12/24/2021) 12 tablet 12   No current facility-administered medications for this visit.    ALLERGIES:   Patient has no known allergies.   SOCIAL HISTORY:  The patient  reports that he quit smoking about 38 years ago. His smoking use included cigarettes. He has a 20.00 pack-year smoking history. He has never used smokeless tobacco. He reports that he does not drink alcohol and does not use drugs.   FAMILY HISTORY:   family history includes Arrhythmia in his mother; Diabetes in his son; Lung cancer in his father.    REVIEW OF SYSTEMS: Review of Systems  Constitutional: Negative.   HENT: Negative.    Eyes: Negative.   Respiratory: Negative.  Negative for shortness of breath.   Cardiovascular: Negative.  Negative for chest pain.  Gastrointestinal: Negative.   Genitourinary: Negative.   Musculoskeletal: Negative.   Neurological: Negative.   Psychiatric/Behavioral: Negative.    All other systems reviewed and are negative.  PHYSICAL EXAM: VS:  BP 132/62 (BP Location: Left Arm, Patient Position: Sitting, Cuff Size: Normal)   Pulse 69   Ht '5\' 5"'$  (1.651 m)    Wt 185 lb 6 oz (84.1 kg)   SpO2 96%   BMI 30.85 kg/m  , BMI Body mass index is 30.85 kg/m. Constitutional:  oriented to person, place, and time. No distress.  HENT:  Head: Grossly normal Eyes:  no discharge. No scleral icterus.  Neck: No JVD, no carotid bruits  Cardiovascular: Regular rate and rhythm, no murmurs appreciated Pulmonary/Chest: Clear to auscultation bilaterally, no wheezes or rails Abdominal: Soft.  no distension.  no tenderness.  Musculoskeletal: Normal range of motion Neurological:  normal muscle tone. Coordination normal. No atrophy Skin: Skin warm and dry Psychiatric: normal affect, pleasant  RECENT LABS: 11/02/2021: ALT 16 11/07/2021: BUN 14; Creatinine, Ser 0.94; Hemoglobin 12.4; Magnesium 1.8; Platelets 189; Potassium 3.7; Sodium 135    LIPID PANEL: Lab Results  Component Value Date   CHOL 148 09/05/2021   HDL 39 (L) 09/05/2021   LDLCALC 82 09/05/2021   TRIG 154 (H) 09/05/2021     WEIGHT: Wt Readings from Last 3 Encounters:  12/24/21 185 lb 6 oz (84.1 kg)  12/05/21 183 lb (83 kg)  11/14/21 182 lb 3.2 oz (82.6 kg)     ASSESSMENT AND PLAN:   History of stroke paroxysmal atrial fibrillation, previously documented  February 2020 On metoprolol with Eliquis, denies any breakthrough tachypalpitations concerning for recurrent A-fib  Paroxysmal atrial fibrillation  continue metoprolol with Eliquis, no changes to medications  Essential hypertension Blood pressure is well controlled on today's visit. No changes made to the medications.  Hyperlipidemia  Cholesterol is at goal on the current lipid regimen. No changes to the medications were made.  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 12/24/2021  [place], [place]

## 2021-12-24 ENCOUNTER — Ambulatory Visit: Payer: PPO | Attending: Cardiovascular Disease | Admitting: Cardiovascular Disease

## 2021-12-24 ENCOUNTER — Encounter: Payer: Self-pay | Admitting: Cardiovascular Disease

## 2021-12-24 VITALS — BP 132/62 | HR 69 | Ht 65.0 in | Wt 185.4 lb

## 2021-12-24 DIAGNOSIS — I480 Paroxysmal atrial fibrillation: Secondary | ICD-10-CM | POA: Diagnosis not present

## 2021-12-24 DIAGNOSIS — Z8673 Personal history of transient ischemic attack (TIA), and cerebral infarction without residual deficits: Secondary | ICD-10-CM | POA: Diagnosis not present

## 2021-12-24 DIAGNOSIS — I340 Nonrheumatic mitral (valve) insufficiency: Secondary | ICD-10-CM | POA: Diagnosis not present

## 2021-12-24 DIAGNOSIS — I10 Essential (primary) hypertension: Secondary | ICD-10-CM

## 2021-12-24 DIAGNOSIS — I350 Nonrheumatic aortic (valve) stenosis: Secondary | ICD-10-CM

## 2021-12-24 DIAGNOSIS — Z952 Presence of prosthetic heart valve: Secondary | ICD-10-CM

## 2021-12-24 MED ORDER — AMLODIPINE BESYLATE 5 MG PO TABS: 5.0000 mg | ORAL_TABLET | Freq: Every day | ORAL | 3 refills | Status: DC

## 2021-12-24 NOTE — Patient Instructions (Addendum)
Medication Instructions:  No changes  If you need a refill on your cardiac medications before your next appointment, please call your pharmacy.   Lab work: No new labs needed  Testing/Procedures: No new testing needed  Follow-Up: At [facility], you and your health needs are our priority.  As part of our continuing mission to provide you with exceptional heart care, we have created designated Provider Care Teams.  These Care Teams include your primary Cardiologist (physician) and Advanced Practice Providers (APPs -  Physician Assistants and Nurse Practitioners) who all work together to provide you with the care you need, when you need it.  You will need a follow up appointment in 12 months  Providers on your designated Care Team:   Christopher Berge, NP Ryan Dunn, PA-C Cadence Furth, PA-C  COVID-19 Vaccine Information can be found at: [email or web address] For questions related to vaccine distribution or appointments, please email [email or web address] or call [phone].   

## 2022-01-01 DIAGNOSIS — D0439 Carcinoma in situ of skin of other parts of face: Secondary | ICD-10-CM | POA: Diagnosis not present

## 2022-01-01 DIAGNOSIS — L853 Xerosis cutis: Secondary | ICD-10-CM | POA: Diagnosis not present

## 2022-01-01 DIAGNOSIS — L814 Other melanin hyperpigmentation: Secondary | ICD-10-CM | POA: Diagnosis not present

## 2022-01-01 DIAGNOSIS — L821 Other seborrheic keratosis: Secondary | ICD-10-CM | POA: Diagnosis not present

## 2022-01-01 DIAGNOSIS — L570 Actinic keratosis: Secondary | ICD-10-CM | POA: Diagnosis not present

## 2022-01-01 DIAGNOSIS — D492 Neoplasm of unspecified behavior of bone, soft tissue, and skin: Secondary | ICD-10-CM | POA: Diagnosis not present

## 2022-01-01 DIAGNOSIS — L578 Other skin changes due to chronic exposure to nonionizing radiation: Secondary | ICD-10-CM | POA: Diagnosis not present

## 2022-01-01 DIAGNOSIS — D229 Melanocytic nevi, unspecified: Secondary | ICD-10-CM | POA: Diagnosis not present

## 2022-01-23 ENCOUNTER — Encounter: Payer: Self-pay | Admitting: Neurology

## 2022-01-23 ENCOUNTER — Ambulatory Visit: Payer: PPO | Admitting: Neurology

## 2022-01-23 VITALS — BP 140/70 | HR 63 | Ht 65.0 in | Wt 185.0 lb

## 2022-01-23 DIAGNOSIS — H34231 Retinal artery branch occlusion, right eye: Secondary | ICD-10-CM

## 2022-01-23 DIAGNOSIS — Z952 Presence of prosthetic heart valve: Secondary | ICD-10-CM

## 2022-01-23 DIAGNOSIS — I480 Paroxysmal atrial fibrillation: Secondary | ICD-10-CM

## 2022-01-23 NOTE — Progress Notes (Signed)
[facility] [address] [place]. [place] [address] [phone]       OFFICE FOLLOW-UP VISIT NOTE  Jon Mejia Date of Birth:  12-Dec-1940 Medical Record Number:  [id number]   Referring MD:  Jon Kathlen Mody PA-c  Reason for Referral:  blurred vision  HPI: Initial visit 09/05/2021 Jon Mejia is a 81 year old pleasant Caucasian male seen today for office consultation visit for blurred vision.  He is accompanied by his daughter.  History is obtained from them and review of electronic medical records and I personally reviewed pertinent available imaging films in PACS.  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  Patient was seen by me at that time and then lost to follow-up since last office video visit 04/2118.  Patient states he was doing well about a week ago when he developed sudden onset of painless right eye blurred vision.  This lasted only about 10 minutes and recovered completely.  He states this is normally the nasal portion of the right eye does not completely affect the whole eye but he could see  from the corner of his eye.  He denies any accompanying headache, slurred speech, diplopia, vertigo, gait or balance problems or extremity weakness he states that a few months ago he was had a complete eye exam by ophthalmologist and spine.  Patient requests follow-up paroxysmal A-fib and states he has been compliant with did not miss any doses he is also on Lipitor 40 mg which is tolerating well without pain.  His blood pressure is well-controlled at home and whitecoat hypertension and lives at home with his wife and he is fully independent in all activities of daily living.  He denies any symptoms of scalp tenderness, jaw claudication, muscle aches and pains or frequent headaches with loss of vision.  Lab work on 10/03/2020 shows LDL  cholesterol 86 mg percent and hemoglobin A1c is 6.1.  No recent brain imaging studies available for review today.  MRI scan 03/14/2018 had shown a tiny right posterior frontal punctate infarct small chronic cerebellar infarct left.  CT angiogram of brain and neck on 03/13/2018 showed mild atherosclerotic changes at both carotid bifurcations 30% right ICA bulb and 50% left vertebral artery origin stenosis. Update 01/23/2022 : He returns for follow-up after last visit 4-1/2 months ago.  He is accompanied by his wife.  He states he is doing well.  He has had no further episodes of blurred vision or vision loss.  He remains on Eliquis which is tolerating well without bleeding or bruising.  His blood pressure is under good control and today it is 140/70.  He is tolerating Lipitor well without muscle aches and pains.  He had lab work done on 09/25/2021 and ESR was normal at 23.  LDL cholesterol was 82 and hemoglobin A1c 6.1.  Triglycerides were elevated slightly at 154.  MRI scan of the brain on 09/25/2021 showed no acute abnormality and only changes of mild small vessel disease.  MR angiogram of the neck and brain both did not show large vessel stenosis or occlusion.  CTA of the coronaries on 09/28/2021 showed calcified aortic valve and he underwent transcatheter aortic valve repair in October and procedure went well.  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  He has no new complaints today. 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   91m S3UR via TF approach with  Jon Mejia Jon Mejia   Social History:  Social History   Socioeconomic History   Marital status: Married    Spouse name: Jon Mejia  Number of children: 2   Years of education: Not on file   Highest education level: 12th grade  Occupational History   Occupation: Retired   Occupation: Retired [employer] Tobacco Use   Smoking status: Former    Packs/day: 1.00    Years: 20.00    Total pack years: 20.00    Types: Cigarettes    Quit date: 03/12/1983    Years since quitting: 38.8   Smokeless tobacco: Never  Vaping Use   Vaping Use: Never used  Substance and Sexual Activity   Alcohol use: No   Drug use: No   Sexual activity: Not on file  Other Topics Concern   Not on file  Social History Narrative   Not on file   Social Determinants of Health   Financial Resource Strain: Low Risk  (08/25/2019)   Overall Financial Resource Strain (CARDIA)    Difficulty of Paying Living Expenses: Not hard at all  Food Insecurity: No Food Insecurity (11/06/2021)   Hunger Vital Sign    Worried About Running Out of Food in the Last Year: Never true    RDinwiddiein the Last Year: Never true  Transportation Needs: No Transportation Needs (11/06/2021)   PRAPARE - THydrologist(Medical): No    Lack of Transportation (Non-Medical): No  Physical Activity: Inactive (08/25/2019)   Exercise Vital Sign    Days of Exercise per Week: 0 days    Minutes of Exercise per Session: 0 min  Stress: No Stress Concern Present (08/25/2019)   FBacon   Feeling of Stress : Not at all  Social Connections: Moderately Integrated (08/25/2019)   Social Connection and Isolation Panel [NHANES]    Frequency of Communication with Friends and Family: Three times a week    Frequency of Social Gatherings with Friends and Family: More than three times a week    Attends Religious Services: More than 4 times per  year    Active Member of Clubs or Organizations: No    Attends CArchivistMeetings: Never    Marital Status: Married  IHuman resources officerViolence: Not At Risk (11/06/2021)   Humiliation, Afraid, Rape, and Kick questionnaire    Fear of Current or Ex-Partner: No    Emotionally Abused: No    Physically Abused: No    Sexually Abused: No    Medications:   Current Outpatient Medications on File Prior to Visit  Medication Sig Dispense Refill   amLODipine (NORVASC) 5 MG tablet Take 1 tablet (5 mg total) by mouth daily. 90 tablet 3   atorvastatin (LIPITOR) 40 MG tablet TAKE 1 TABLET BY MOUTH DAILY 90 tablet 4   Cyanocobalamin (B-12) 3000 MCG CAPS Take 3,000 mcg by mouth daily as needed (  energy).     ELIQUIS 5 MG TABS tablet TAKE ONE (1) TABLET BY MOUTH TWO TIMES PER DAY 180 tablet 1   metoprolol succinate (TOPROL XL) 25 MG 24 hr tablet Take 1.5 tablets (37.5 mg total) by mouth daily. 135 tablet 3   amoxicillin (AMOXIL) 500 MG tablet Take 4 tablets (2,000 mg total) by mouth as directed. 1 hour prior to dental work including cleanings (Patient not taking: Reported on 12/24/2021) 12 tablet 12   No current facility-administered medications on file prior to visit.    Allergies:  No Known Allergies  Physical Exam General: Mildly obese elderly Caucasian male, seated, in no evident distress Head: head normocephalic and atraumatic.   Neck: supple with no carotid or supraclavicular bruits Cardiovascular: regular rate and rhythm, soft ejection systolic murmur. Musculoskeletal: no deformity Skin:  no rash/petichiae Vascular:  Normal pulses all extremities  Neurologic Exam Mental Status: Awake and fully alert. Oriented to place and time. Recent and remote memory intact. Attention span, concentration and fund of knowledge appropriate. Mood and affect appropriate.  Cranial Nerves: Fundoscopic exam not done. Pupils equal, briskly reactive to light. Extraocular movements full without nystagmus.  Visual fields full to confrontation. Hearing intact. Facial sensation intact. Face, tongue, palate moves normally and symmetrically.  Motor: Normal bulk and tone. Normal strength in all tested extremity muscles. Sensory.: intact to touch , pinprick , position and vibratory sensation.  Coordination: Rapid alternating movements normal in all extremities. Finger-to-nose and heel-to-shin performed accurately bilaterally. Gait and Station: Arises from chair without difficulty. Stance is normal. Gait demonstrates normal stride length and balance . Able to heel, toe and tandem walk without difficulty.  Reflexes: 1+ and symmetric. Toes downgoing.       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  Vascular risk factors of hypertension, hyperlipidemia, atrial fibrillation, cerebrovascular disease and mild obesity     PLAN:I had a long d/w patient about his episode of blurred vision, atrial fibrillation, risk for recurrent stroke/TIAs, personally independently reviewed imaging studies and stroke evaluation results and answered questions.Continue Eliquis (apixaban) daily  for secondary stroke prevention and maintain strict control of hypertension with blood pressure goal below 130/90, diabetes with hemoglobin A1c goal below 6.5% and lipids with LDL cholesterol goal below 70 mg/dL. I also advised the patient to eat a healthy diet with plenty of whole grains, cereals, fruits and vegetables, exercise regularly and maintain ideal body weight .patient may also consider possible participation in the  [place] AF study (eliquis versus  Milvexian- Factor 11 inhibitor ) if interested and will be given information to call them and review and decide.  Followup in the future  with me in 1 year or call earlier if necessary. Greater than 50% time during this 35-minute visit was spent on counseling and coordination of care about his transient blurred vision and discussion about evaluation and treatment and answering questions.  Antony Contras, MD Note: This document was prepared with digital dictation and possible smart phrase technology. Any transcriptional errors that result from this process are unintentional.

## 2022-01-23 NOTE — Patient Instructions (Signed)
I had a long d/w patient about his episode of blurred vision, atrial fibrillation, risk for recurrent stroke/TIAs, personally independently reviewed imaging studies and stroke evaluation results and answered questions.Continue Eliquis (apixaban) daily  for secondary stroke prevention and maintain strict control of hypertension with blood pressure goal below 130/90, diabetes with hemoglobin A1c goal below 6.5% and lipids with LDL cholesterol goal below 70 mg/dL. I also advised the patient to eat a healthy diet with plenty of whole grains, cereals, fruits and vegetables, exercise regularly and maintain ideal body weight .patient may also consider possible participation in the  [place] AF study (eliquis versus  Milvexian- Factor 11 inhibitor ) if interested and will be given information to call them and review and decide.  Followup in the future with me in 1 year or call earlier if necessary.

## 2022-04-09 DIAGNOSIS — L821 Other seborrheic keratosis: Secondary | ICD-10-CM | POA: Diagnosis not present

## 2022-04-09 DIAGNOSIS — Z1283 Encounter for screening for malignant neoplasm of skin: Secondary | ICD-10-CM | POA: Diagnosis not present

## 2022-04-09 DIAGNOSIS — D099 Carcinoma in situ, unspecified: Secondary | ICD-10-CM | POA: Diagnosis not present

## 2022-04-09 DIAGNOSIS — Z85828 Personal history of other malignant neoplasm of skin: Secondary | ICD-10-CM | POA: Diagnosis not present

## 2022-04-15 ENCOUNTER — Other Ambulatory Visit: Payer: Self-pay | Admitting: Cardiovascular Disease

## 2022-05-14 ENCOUNTER — Other Ambulatory Visit: Payer: Self-pay | Admitting: Cardiovascular Disease

## 2022-05-14 DIAGNOSIS — Z952 Presence of prosthetic heart valve: Secondary | ICD-10-CM

## 2022-05-14 DIAGNOSIS — I4891 Unspecified atrial fibrillation: Secondary | ICD-10-CM

## 2022-05-14 NOTE — Telephone Encounter (Signed)
Eliquis  refill request received. Patient is 82 years old, weight-83.9kg, Crea-0.94 on 11/07/21, Diagnosis-Afib, and last seen by Dr. Mariah Milling on 12/24/21. Dose is appropriate based on dosing criteria. Will send in refill to requested pharmacy.

## 2022-05-14 NOTE — Telephone Encounter (Signed)
Refill request

## 2022-05-15 NOTE — Progress Notes (Unsigned)
   Vivien Rota DeSanto,acting as a scribe for Alfredia Ferguson, PA-C.,have documented all relevant documentation on the behalf of Alfredia Ferguson, PA-C,as directed by  Alfredia Ferguson, PA-C while in the presence of Alfredia Ferguson, PA-C.     Established patient visit   Patient: Jon Mejia   DOB: [birth year]   82 y.o. Male  MRN: [id number] Visit Date: 05/16/2022  Today's healthcare provider: Alfredia Ferguson, PA-C   No chief complaint on file.  Subjective    HPI  ***  Medications: Outpatient Medications Prior to Visit  Medication Sig   amLODipine (NORVASC) 5 MG tablet Take 1 tablet (5 mg total) by mouth daily.   amoxicillin (AMOXIL) 500 MG tablet Take 4 tablets (2,000 mg total) by mouth as directed. 1 hour prior to dental work including cleanings (Patient not taking: Reported on 12/24/2021)   atorvastatin (LIPITOR) 40 MG tablet TAKE 1 TABLET BY MOUTH DAILY   Cyanocobalamin (B-12) 3000 MCG CAPS Take 3,000 mcg by mouth daily as needed (energy).   ELIQUIS 5 MG TABS tablet TAKE ONE (1) TABLET BY MOUTH TWO TIMES PER DAY   metoprolol succinate (TOPROL-XL) 25 MG 24 hr tablet Take 1.5 tablets (37.5 mg total) by mouth daily.   No facility-administered medications prior to visit.    Review of Systems  {Labs  Heme  Chem  Endocrine  Serology  Results Review (optional):23779}   Objective    There were no vitals taken for this visit. {Show previous vital signs (optional):23777}  Physical Exam  ***  No results found for any visits on 05/16/22.  Assessment & Plan     ***  No follow-ups on file.      {provider attestation***:1}   Alfredia Ferguson, PA-C  [facility] [phone] (phone) [phone] (fax)  [facility]

## 2022-05-16 ENCOUNTER — Encounter: Payer: Self-pay | Admitting: Physician Assistant

## 2022-05-16 ENCOUNTER — Ambulatory Visit (INDEPENDENT_AMBULATORY_CARE_PROVIDER_SITE_OTHER): Payer: PPO | Admitting: Physician Assistant

## 2022-05-16 VITALS — BP 134/79 | HR 66 | Temp 97.7°F | Wt 183.0 lb

## 2022-05-16 DIAGNOSIS — R1011 Right upper quadrant pain: Secondary | ICD-10-CM

## 2022-05-16 DIAGNOSIS — K8020 Calculus of gallbladder without cholecystitis without obstruction: Secondary | ICD-10-CM | POA: Diagnosis not present

## 2022-05-17 ENCOUNTER — Telehealth: Payer: Self-pay | Admitting: Cardiovascular Disease

## 2022-05-17 DIAGNOSIS — I4891 Unspecified atrial fibrillation: Secondary | ICD-10-CM

## 2022-05-17 DIAGNOSIS — Z952 Presence of prosthetic heart valve: Secondary | ICD-10-CM

## 2022-05-17 LAB — COMPREHENSIVE METABOLIC PANEL
ALT: 19 IU/L (ref 0–44)
AST: 27 IU/L (ref 0–40)
Albumin/Globulin Ratio: 1.8 (ref 1.2–2.2)
Albumin: 4.4 g/dL (ref 3.8–4.8)
Alkaline Phosphatase: 116 IU/L (ref 44–121)
BUN/Creatinine Ratio: 11 (ref 10–24)
BUN: 12 mg/dL (ref 8–27)
Bilirubin Total: 0.7 mg/dL (ref 0.0–1.2)
CO2: 19 mmol/L — ABNORMAL LOW (ref 20–29)
Calcium: 9.7 mg/dL (ref 8.6–10.2)
Chloride: 107 mmol/L — ABNORMAL HIGH (ref 96–106)
Creatinine, Ser: 1.06 mg/dL (ref 0.76–1.27)
Globulin, Total: 2.5 g/dL (ref 1.5–4.5)
Glucose: 98 mg/dL (ref 70–99)
Potassium: 4.7 mmol/L (ref 3.5–5.2)
Sodium: 142 mmol/L (ref 134–144)
Total Protein: 6.9 g/dL (ref 6.0–8.5)
eGFR: 71 mL/min/{1.73_m2} (ref >59)

## 2022-05-17 LAB — LIPASE: Lipase: 33 U/L (ref 13–78)

## 2022-05-17 MED ORDER — APIXABAN 5 MG PO TABS: ORAL_TABLET | ORAL | 1 refills | Status: DC

## 2022-05-17 NOTE — Telephone Encounter (Signed)
Pt last saw Dr Mariah Milling 12/24/21, last labs 05/16/22 Creat 1.06, age 82, weight 83kg, based on specified criteria pt is on appropriate dosage of Eliquis  BID for afib.  Will refill rx.

## 2022-05-17 NOTE — Telephone Encounter (Signed)
*  STAT* If patient is at the pharmacy, call can be transferred to refill team.   1. Which medications need to be refilled? (please list name of each medication and dose if known) ELIQUIS 5 MG TABS tablet   2. Which pharmacy/location (including street and city if local pharmacy) is medication to be sent to? [facility] - [place], [place] - [address]   3. Do they need a 30 day or 90 day supply? 90 day  Pt only has a days worth of the medication left.

## 2022-05-17 NOTE — Telephone Encounter (Signed)
Patient calling the office for samples of medication:   1.  What medication and dosage are you requesting samples for? ELIQUIS 5 MG TABS tablet   2.  Are you currently out of this medication?  Per pts wife, pt only has 1 days worth of the medication left. Wife can be called at [phone] to notify when ready for pickup.

## 2022-05-17 NOTE — Telephone Encounter (Signed)
Refill request

## 2022-05-17 NOTE — Telephone Encounter (Signed)
Called spoke with pt's wife, advised we have sent pt's Eliquis refill to the pharmacy as requested.  Wife thanked me for the call back.

## 2022-05-17 NOTE — Telephone Encounter (Signed)
Spoke with wife patient only have 3 tablets left. He will not have a pill to take tomorrow evening.

## 2022-05-24 ENCOUNTER — Ambulatory Visit
Admission: RE | Admit: 2022-05-24 | Discharge: 2022-05-24 | Disposition: A | Discharge status: Home / Self Care | Payer: PPO | Source: Ambulatory Visit | Attending: Physician Assistant | Admitting: Physician Assistant

## 2022-05-24 ENCOUNTER — Other Ambulatory Visit: Payer: Self-pay | Admitting: Physician Assistant

## 2022-05-24 DIAGNOSIS — R1011 Right upper quadrant pain: Secondary | ICD-10-CM

## 2022-05-24 DIAGNOSIS — K828 Other specified diseases of gallbladder: Secondary | ICD-10-CM | POA: Diagnosis not present

## 2022-05-24 DIAGNOSIS — K8020 Calculus of gallbladder without cholecystitis without obstruction: Secondary | ICD-10-CM | POA: Diagnosis not present

## 2022-05-29 NOTE — Progress Notes (Unsigned)
Patient ID: Jon Mejia, male   DOB: December 26, 1940, 82 y.o.   MRN: [id number]  Chief Complaint: Substernal pain  History of Present Illness Jon Mejia is a 82 y.o. male with history of intermittent substernal pain, and some diarrhea with urgency.  Denies any fatty food intolerance, actually admits that dry toast would provoke the midline epigastric and substernal pain.  Has not attempted to utilize any antacid, Pepcid or proton pump inhibitor.  Denies any right upper quadrant pain, no radiation to the right upper quadrant or right scapular area.  He has some urgency with loose stools that is unpredictable, he reports are occasionally black and he is currently taking Eliquis.  His last colonoscopy is remote, has never had an upper endoscopy.  Associated nausea without vomiting.  Had a repeat/follow-up ultrasound obtained April 26 showing gallstones, otherwise uncomplicated.  Reports his substernal pain is made significantly worse with supine positioning.  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 Clifton James and Dr. Leafy Ro      Past Surgical History:  Procedure Laterality Date   INTRAOPERATIVE TRANSTHORACIC ECHOCARDIOGRAM N/A 11/06/2021   Procedure: INTRAOPERATIVE TRANSTHORACIC ECHOCARDIOGRAM;  Surgeon: Kathleene Hazel, MD;  Location: [facility] OR;  Service: Open Heart Surgery;  Laterality: N/A;   RIGHT/LEFT HEART CATH AND CORONARY ANGIOGRAPHY N/A 09/17/2021   Procedure: RIGHT/LEFT HEART CATH AND CORONARY ANGIOGRAPHY;  Surgeon: Kathleene Hazel, MD;  Location: [facility] INVASIVE CV LAB;  Service: Cardiovascular;  Laterality: N/A;   SKIN CANCER EXCISION  07/2020   TRANSCATHETER  AORTIC VALVE REPLACEMENT, TRANSFEMORAL N/A 11/06/2021   Procedure: Transcatheter Aortic Valve Replacement, Transfemoral;  Surgeon: Kathleene Hazel, MD;  Location: [facility] OR;  Service: Open Heart Surgery;  Laterality: N/A;    No Known Allergies  Current Outpatient Medications  Medication Sig Dispense Refill   amLODipine (NORVASC) 5 MG tablet Take 1 tablet (5 mg total) by mouth daily. 90 tablet 3   apixaban (ELIQUIS) 5 MG TABS tablet TAKE ONE (1) TABLET BY MOUTH TWO TIMES PER DAY 180 tablet 1   atorvastatin (LIPITOR) 40 MG tablet TAKE 1 TABLET BY MOUTH DAILY 90 tablet 4   Cyanocobalamin (B-12) 3000 MCG CAPS Take 3,000 mcg by mouth daily as needed (energy).     metoprolol succinate (TOPROL-XL) 25 MG 24 hr tablet Take 1.5 tablets (37.5 mg total) by mouth daily. 135 tablet 1   omeprazole (PRILOSEC) 40 MG capsule Take 1 capsule (40 mg total) by mouth daily. 30 capsule 0   No current facility-administered medications for this visit.    Family History Family History  Problem Relation Age of Onset   Arrhythmia Mother        A-Fib   Lung cancer Father    Diabetes Son    Colon cancer Neg Hx    Prostate cancer Neg Hx       Social History Social History   Tobacco Use   Smoking status: Former    Packs/day: 1.00    Years: 20.00    Additional pack years: 0.00    Total pack years: 20.00    Types: Cigarettes    Quit date: 03/12/1983  Years since quitting: 39.2   Smokeless tobacco: Never  Vaping Use   Vaping Use: Never used  Substance Use Topics   Alcohol use: No   Drug use: No        Review of Systems  Constitutional:  Positive for malaise/fatigue.  HENT: Negative.    Eyes: Negative.   Respiratory:  Positive for shortness of breath.   Cardiovascular: Negative.   Gastrointestinal:  Positive for abdominal pain and diarrhea. Negative for blood in stool.  Genitourinary: Negative.   Skin: Negative.   Neurological: Negative.   Psychiatric/Behavioral: Negative.        Physical Exam Blood pressure (!) 155/80, pulse 71, temperature 98 F (36.7 C), temperature source Oral, height 5\' 5"  (1.651 m), weight 182 lb (82.6 kg), SpO2 95 %. Last Weight  Most recent update: 05/30/2022 10:04 AM    Weight  82.6 kg (182 lb)             CONSTITUTIONAL: Well developed, and nourished, appropriately responsive and aware without distress.   EYES: Sclera non-icteric.   EARS, NOSE, MOUTH AND THROAT:  The oropharynx is clear. Oral mucosa is pink and moist.    Hearing is intact to voice.  NECK: Trachea is midline, and there is no jugular venous distension.  LYMPH NODES:  Lymph nodes in the neck are not appreciated. RESPIRATORY:   Normal respiratory effort without pathologic use of accessory muscles. CARDIOVASCULAR:  Well perfused.  GI: The abdomen is  soft, nontender, and nondistended. There were no palpable masses.  I did not appreciate hepatosplenomegaly.  There is no right upper quadrant tenderness whatsoever. MUSCULOSKELETAL:  Symmetrical muscle tone appreciated in all four extremities.    SKIN: Skin turgor is normal. No pathologic skin lesions appreciated.  NEUROLOGIC:  Motor and sensation appear grossly normal.  Cranial nerves are grossly without defect. PSYCH:  Alert and oriented to person, place and time. Affect is appropriate for situation.  Data Reviewed I have personally reviewed what is currently available of the patient's imaging, recent labs and medical records.   Labs:     Latest Ref Rng & Units 11/07/2021    7:01 AM 11/06/2021    9:45 AM 11/06/2021    9:25 AM  CBC  WBC 4.0 - 10.5 K/uL 9.7     Hemoglobin 13.0 - 17.0 g/dL 19.1  47.8  29.5   Hematocrit 39.0 - 52.0 % 35.8  35.0  33.0   Platelets 150 - 400 K/uL 189         Latest Ref Rng & Units 05/16/2022    1:56 PM 11/07/2021    7:01 AM 11/06/2021    9:45 AM  CMP  Glucose 70 - 99 mg/dL 98  621  308   BUN 8 - 27 mg/dL 12  14  23    Creatinine 0.76 - 1.27 mg/dL 6.57  8.46  9.62   Sodium 134 -  144 mmol/L 142  135  139   Potassium 3.5 - 5.2 mmol/L 4.7  3.7  4.4   Chloride 96 - 106 mmol/L 107  107  107   CO2 20 - 29 mmol/L 19  22    Calcium 8.6 - 10.2 mg/dL 9.7  8.8    Total Protein 6.0 - 8.5 g/dL 6.9     Total Bilirubin 0.0 - 1.2 mg/dL 0.7     Alkaline Phos 44 - 121 IU/L 116     AST 0 - 40 IU/L 27     ALT 0 - 44  IU/L 19         Imaging: Radiological images reviewed:  Narrative & Impression  CLINICAL DATA:  Right upper quadrant pain.   EXAM: ULTRASOUND ABDOMEN LIMITED RIGHT UPPER QUADRANT   COMPARISON:  Ultrasound abdomen 10/16/2021   FINDINGS: Gallbladder:   Stones and sludge within the gallbladder lumen. No gallbladder wall thickening or pericholecystic fluid. Negative sonographic Murphy's sign.   Common bile duct:   Diameter: 3.7 mm   Liver:   Increased echogenicity. No focal lesion. Portal vein is patent on color Doppler imaging with normal direction of blood flow towards the liver.   Other: None.   IMPRESSION: 1. Cholelithiasis without secondary signs of acute cholecystitis. 2. Increased hepatic parenchymal echogenicity suggestive of steatosis.     Electronically Signed   By: Annia Belt M.D.   On: 05/24/2022 12:09   Within last 24 hrs: No results found.  Assessment    Gallstones of nonclassic symptomatology, though may be the cause of substernal chest pain, his chest pain seems to be provoked with nonfatty foods.  Associated diarrhea may be due to other etiologies as well.  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 Cholecystectomy will only address those symptoms related to his gallbladder.  If he continues to have pain despite PPI management, will advise/encouraged him to consider proceeding with robotic cholecystectomy.  Face-to-face time spent with the patient and accompanying care providers(if present) was 30 minutes, with more than 50% of the time spent counseling, educating, and coordinating care of the patient.    These notes generated with voice recognition software. I apologize for typographical errors.  Campbell Lerner M.D., FACS 05/30/2022, 12:34 PM

## 2022-05-30 ENCOUNTER — Encounter: Payer: Self-pay | Admitting: Surgery

## 2022-05-30 ENCOUNTER — Ambulatory Visit (INDEPENDENT_AMBULATORY_CARE_PROVIDER_SITE_OTHER): Payer: PPO | Admitting: Surgery

## 2022-05-30 VITALS — BP 155/80 | HR 71 | Temp 98.0°F | Ht 65.0 in | Wt 182.0 lb

## 2022-05-30 DIAGNOSIS — K8020 Calculus of gallbladder without cholecystitis without obstruction: Secondary | ICD-10-CM | POA: Diagnosis not present

## 2022-05-30 DIAGNOSIS — R072 Precordial pain: Secondary | ICD-10-CM | POA: Insufficient documentation

## 2022-05-30 MED ORDER — OMEPRAZOLE 40 MG PO CPDR: 40.0000 mg | DELAYED_RELEASE_CAPSULE | Freq: Every day | ORAL | 0 refills | Status: DC

## 2022-05-30 NOTE — Patient Instructions (Addendum)
Please pick up your prescription at your pharmacy.   If you have any concerns or questions, please feel free to call our office.   Cholelithiasis   Cholelithiasis happens when gallstones form in the gallbladder. The gallbladder stores bile. Bile is a fluid that helps digest fats. Bile can harden and form into gallstones. If they cause a blockage, they can cause pain (gallbladder attack). What are the causes? This condition may be caused by: Too much bilirubin in the bile. This happens if you have sickle cell anemia. Too much of a fat-like substance (cholesterol) in your bile. Not enough bile salts in your bile. These salts help the body absorb and digest fats. The gallbladder not emptying fully or often enough. This is common in pregnant women. What increases the risk? The following factors may make you more likely to develop this condition: Being older than age 61. Eating a lot of fried foods, fat, and refined carbs (refined carbohydrates). Being male. Being pregnant many times. Using medicines with male hormones in them for a long time. Losing weight fast. Having gallstones in your family. Having health problems, such as diabetes, obesity, Crohn's disease, or liver disease. What are the signs or symptoms? Often, there may be gallstones but no symptoms. These gallstones are called silent gallstones. If a gallstone causes a blockage, you may get sudden pain. The pain: Can be in the upper right part of your belly (abdomen). Normally comes at night or after you eat. Can last an hour or more. Can spread to your right shoulder, back, or chest. Can feel like discomfort, burning, or fullness in the upper part of your belly (indigestion). If the blockage lasts more than a few hours, you can get an infection or swelling. You may: Vomit or feel like you may vomit (nauseous). Feel bloated. Have belly pain for 5 hours or more. Feel tender in your belly, often in the upper right part and  under your ribs. Have a fever or chills. Have skin or the white parts of your eyes turn yellow (jaundice). Have dark pee (urine) or pale poop (stool). How is this treated? Treatment for this condition depends on how bad you feel. If you have symptoms, you may need: Home care, if symptoms are not very bad. Do not eat for 12-24 hours. Drink only water and clear liquids. After 1 or 2 days, start to eat simple or clear foods. Try broth and crackers. You may need medicines for pain or stomach upset or both. If you have an infection, you will need antibiotics. A hospital stay, if you have very bad pain or a very bad infection. Surgery to remove your gallbladder. You may need this if: Gallstones keep coming back. You have very bad symptoms. Medicines to break up gallstones. Medicines may be used for 6-12 months. A procedure to find and take out gallstones or to break up gallstones. Follow these instructions at home: Medicines Take over-the-counter and prescription medicines only as told by your doctor. If you were prescribed antibiotics, take them as told by your doctor. Do not stop taking them even if you start to feel better. Ask your doctor if you should avoid driving or using machines while you are taking your medicine. Eating and drinking Drink enough fluid to keep your pee pale yellow. Drink water or clear fluids. This is important when you have pain. Eat healthy foods. Choose: Fewer fatty foods, such as fried foods. Fewer refined carbs. Avoid breads and grains that are highly processed, such  as white bread and white rice. Choose whole grains, such as whole-wheat bread and brown rice. More fiber. Almonds, fresh fruit, and beans are healthy sources. General instructions Keep a healthy weight. Keep all follow-up visits. You may need to see a specialist or a Careers adviser. Where to find more information [employer] of Diabetes and Digestive and Kidney Diseases: [email or web address] Contact a  doctor if: You have sudden pain in the upper right part of your belly. Pain might spread to your right shoulder, back, or chest. Your pain lasts more than 2 hours. You have been diagnosed with gallstones that have no symptoms and you get: Belly pain. Discomfort, burning, or fullness in the upper part of your abdomen. You keep feeling like you may vomit. You have dark pee or pale poop. Get help right away if: You have pain in your abdomen, that: Lasts more than 5 hours. Keeps getting worse. You have a fever or chills. You can't stop vomiting. Your skin or the white parts of your eyes turn yellow. This information is not intended to replace advice given to you by your health care provider. Make sure you discuss any questions you have with your health care provider. Document Revised: 10/29/2021 Document Reviewed: 10/29/2021 Elsevier Patient Education  2023 [employer].

## 2022-06-20 ENCOUNTER — Encounter: Payer: Self-pay | Admitting: Surgery

## 2022-06-20 ENCOUNTER — Ambulatory Visit (INDEPENDENT_AMBULATORY_CARE_PROVIDER_SITE_OTHER): Payer: PPO | Admitting: Surgery

## 2022-06-20 VITALS — BP 144/79 | HR 74 | Temp 98.2°F | Ht 65.0 in | Wt 180.2 lb

## 2022-06-20 DIAGNOSIS — K219 Gastro-esophageal reflux disease without esophagitis: Secondary | ICD-10-CM | POA: Diagnosis not present

## 2022-06-20 DIAGNOSIS — K8020 Calculus of gallbladder without cholecystitis without obstruction: Secondary | ICD-10-CM

## 2022-06-20 MED ORDER — OMEPRAZOLE 40 MG PO CPDR: 40.0000 mg | DELAYED_RELEASE_CAPSULE | Freq: Every day | ORAL | 0 refills | Status: DC

## 2022-06-20 NOTE — Progress Notes (Signed)
Patient ID: Jon Mejia, male   DOB: 11/09/1940, 82 y.o.   MRN: [id number]  Chief Complaint: Substernal pain, resolved.  Gallstones.  History of Present Illness Jon Mejia returns today reporting an excellent response to regular omeprazole.  Denies any substernal pain, nor any nausea, vomiting, fevers or chills since taking the Prilosec. They present today, he is accompanied by his wife to discuss the possibility of surgery.  At the present time he is still in rehab/recovery from his valve replacement.  He works in his garden and has to pause at times due to fatigue.  He does not go on long walks because of the same.  He is currently been avoiding any excess fats in his diet, and denies any epigastric or right upper quadrant pain.   The HPI was carried over from prior visit. Jon Mejia is a 82 y.o. male with history of intermittent substernal pain, and some diarrhea with urgency.  Denies any fatty food intolerance, actually admits that dry toast would provoke the midline epigastric and substernal pain.  Has not attempted to utilize any antacid, Pepcid or proton pump inhibitor.  Denies any right upper quadrant pain, no radiation to the right upper quadrant or right scapular area.  He has some urgency with loose stools that is unpredictable, he reports are occasionally black and he is currently taking Eliquis.  His last colonoscopy is remote, has never had an upper endoscopy.  Associated nausea without vomiting.  Had a repeat/follow-up ultrasound obtained April 26 showing gallstones, otherwise uncomplicated.  Reports his substernal pain is made significantly worse with supine positioning.  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 Clifton James and Dr. Leafy Ro      Past Surgical History:  Procedure Laterality Date   INTRAOPERATIVE TRANSTHORACIC ECHOCARDIOGRAM N/A 11/06/2021   Procedure: INTRAOPERATIVE TRANSTHORACIC ECHOCARDIOGRAM;  Surgeon: Kathleene Hazel, MD;  Location: [facility] OR;  Service: Open Heart Surgery;  Laterality: N/A;   RIGHT/LEFT HEART CATH AND CORONARY ANGIOGRAPHY N/A 09/17/2021   Procedure: RIGHT/LEFT HEART CATH AND CORONARY ANGIOGRAPHY;  Surgeon: Kathleene Hazel, MD;  Location: [facility] INVASIVE CV LAB;  Service: Cardiovascular;  Laterality: N/A;   SKIN CANCER EXCISION  07/2020   TRANSCATHETER AORTIC VALVE REPLACEMENT, TRANSFEMORAL N/A 11/06/2021   Procedure: Transcatheter Aortic Valve Replacement, Transfemoral;  Surgeon: Kathleene Hazel, MD;  Location: [facility] OR;  Service: Open Heart Surgery;  Laterality: N/A;    No Known Allergies  Current Outpatient Medications  Medication Sig Dispense Refill   amLODipine (NORVASC) 5 MG tablet Take 1 tablet (5 mg total) by mouth daily. 90 tablet 3   apixaban (ELIQUIS) 5 MG TABS tablet TAKE ONE (1) TABLET BY MOUTH TWO TIMES PER DAY 180 tablet 1   atorvastatin (LIPITOR) 40 MG tablet TAKE 1 TABLET BY MOUTH DAILY 90 tablet 4   Cyanocobalamin (B-12) 3000 MCG CAPS Take 3,000 mcg by mouth daily as needed (energy).     metoprolol succinate (TOPROL-XL) 25 MG 24 hr tablet Take 1.5 tablets (37.5 mg total) by mouth daily. 135 tablet 1   omeprazole (PRILOSEC) 40 MG capsule Take 1 capsule (40 mg total) by mouth daily. 30  capsule 0   No current facility-administered medications for this visit.    Family History Family History  Problem Relation Age of Onset   Arrhythmia Mother        A-Fib   Lung cancer Father    Diabetes Son    Colon cancer Neg Hx    Prostate cancer Neg Hx       Social History Social History   Tobacco Use   Smoking status: Former    Packs/day: 1.00    Years: 20.00     Additional pack years: 0.00    Total pack years: 20.00    Types: Cigarettes    Quit date: 03/12/1983    Years since quitting: 39.3   Smokeless tobacco: Never  Vaping Use   Vaping Use: Never used  Substance Use Topics   Alcohol use: No   Drug use: No        Review of Systems  Constitutional:  Positive for malaise/fatigue.  HENT: Negative.    Eyes: Negative.   Respiratory:  Positive for shortness of breath.   Cardiovascular: Negative.   Gastrointestinal: Negative.  Negative for abdominal pain and blood in stool.  Genitourinary: Negative.   Skin: Negative.   Neurological: Negative.   Psychiatric/Behavioral: Negative.       Physical Exam Blood pressure (!) 144/79, pulse 74, temperature 98.2 F (36.8 C), temperature source Oral, height 5\' 5"  (1.651 m), weight 180 lb 3.2 oz (81.7 kg), SpO2 94 %. Last Weight  Most recent update: 06/20/2022  9:54 AM    Weight  81.7 kg (180 lb 3.2 oz)             CONSTITUTIONAL: Well developed, and nourished, appropriately responsive and aware without distress.   EYES: Sclera non-icteric.   EARS, NOSE, MOUTH AND THROAT:  The oropharynx is clear. Oral mucosa is pink and moist.    Hearing is intact to voice.  NECK: Trachea is midline, and there is no jugular venous distension.  LYMPH NODES:  Lymph nodes in the neck are not appreciated. RESPIRATORY:   Normal respiratory effort without pathologic use of accessory muscles. CARDIOVASCULAR:  Well perfused.  GI: The abdomen is  soft, nontender, and nondistended. There were no palpable masses.  I did not appreciate hepatosplenomegaly.  There is no right upper quadrant tenderness whatsoever. MUSCULOSKELETAL:  Symmetrical muscle tone appreciated in all four extremities.    SKIN: Skin turgor is normal. No pathologic skin lesions appreciated.  NEUROLOGIC:  Motor and sensation appear grossly normal.  Cranial nerves are grossly without defect. PSYCH:  Alert and oriented to person, place and time. Affect is  appropriate for situation.  Data Reviewed I have personally reviewed what is currently available of the patient's imaging, recent labs and medical records.   Labs:     Latest Ref Rng & Units 11/07/2021    7:01 AM 11/06/2021    9:45 AM 11/06/2021    9:25 AM  CBC  WBC 4.0 - 10.5 K/uL 9.7     Hemoglobin 13.0 - 17.0 g/dL 16.1  09.6  04.5   Hematocrit 39.0 - 52.0 % 35.8  35.0  33.0   Platelets 150 - 400 K/uL 189         Latest Ref Rng & Units 05/16/2022    1:56 PM 11/07/2021    7:01 AM 11/06/2021    9:45 AM  CMP  Glucose 70 - 99 mg/dL 98  409  811   BUN 8 - 27 mg/dL 12  14  23   Creatinine 0.76 - 1.27 mg/dL 9.14  7.82  9.56   Sodium 134 - 144 mmol/L 142  135  139   Potassium 3.5 - 5.2 mmol/L 4.7  3.7  4.4   Chloride 96 - 106 mmol/L 107  107  107   CO2 20 - 29 mmol/L 19  22    Calcium 8.6 - 10.2 mg/dL 9.7  8.8    Total Protein 6.0 - 8.5 g/dL 6.9     Total Bilirubin 0.0 - 1.2 mg/dL 0.7     Alkaline Phos 44 - 121 IU/L 116     AST 0 - 40 IU/L 27     ALT 0 - 44 IU/L 19         Imaging: Radiological images reviewed:  Narrative & Impression  CLINICAL DATA:  Right upper quadrant pain.   EXAM: ULTRASOUND ABDOMEN LIMITED RIGHT UPPER QUADRANT   COMPARISON:  Ultrasound abdomen 10/16/2021   FINDINGS: Gallbladder:   Stones and sludge within the gallbladder lumen. No gallbladder wall thickening or pericholecystic fluid. Negative sonographic Murphy's sign.   Common bile duct:   Diameter: 3.7 mm   Liver:   Increased echogenicity. No focal lesion. Portal vein is patent on color Doppler imaging with normal direction of blood flow towards the liver.   Other: None.   IMPRESSION: 1. Cholelithiasis without secondary signs of acute cholecystitis. 2. Increased hepatic parenchymal echogenicity suggestive of steatosis.     Electronically Signed   By: Annia Belt M.D.   On: 05/24/2022 12:09   Within last 24 hrs: No results found.  Assessment    Gallstones of nonclassic  symptomatology, now unlikely to be the cause of his prior substernal chest pain, his chest pain seems to be resolved with omeprazole.   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 Cholecystectomy will only address those symptoms related to his gallbladder.  If he continues to have pain despite PPI management, will advise/encouraged him to consider proceeding with robotic cholecystectomy.  Face-to-face time spent with the patient and accompanying care providers(if present) was 20 minutes, with more than 50% of the time spent counseling, educating, and coordinating care of the patient.    These notes generated with voice recognition software. I apologize for typographical errors.  Campbell Lerner M.D., FACS 06/20/2022, 9:58 AM

## 2022-06-20 NOTE — Patient Instructions (Addendum)
Please pick up your prescription at your pharmacy.   If you have any concerns or questions, please feel free to call our office.   Cholelithiasis   Cholelithiasis happens when gallstones form in the gallbladder. The gallbladder stores bile. Bile is a fluid that helps digest fats. Bile can harden and form into gallstones. If they cause a blockage, they can cause pain (gallbladder attack). What are the causes? This condition may be caused by: Too much bilirubin in the bile. This happens if you have sickle cell anemia. Too much of a fat-like substance (cholesterol) in your bile. Not enough bile salts in your bile. These salts help the body absorb and digest fats. The gallbladder not emptying fully or often enough. This is common in pregnant women. What increases the risk? The following factors may make you more likely to develop this condition: Being older than age 40. Eating a lot of fried foods, fat, and refined carbs (refined carbohydrates). Being male. Being pregnant many times. Using medicines with male hormones in them for a long time. Losing weight fast. Having gallstones in your family. Having health problems, such as diabetes, obesity, Crohn's disease, or liver disease. What are the signs or symptoms? Often, there may be gallstones but no symptoms. These gallstones are called silent gallstones. If a gallstone causes a blockage, you may get sudden pain. The pain: Can be in the upper right part of your belly (abdomen). Normally comes at night or after you eat. Can last an hour or more. Can spread to your right shoulder, back, or chest. Can feel like discomfort, burning, or fullness in the upper part of your belly (indigestion). If the blockage lasts more than a few hours, you can get an infection or swelling. You may: Vomit or feel like you may vomit (nauseous). Feel bloated. Have belly pain for 5 hours or more. Feel tender in your belly, often in the upper right part and  under your ribs. Have a fever or chills. Have skin or the white parts of your eyes turn yellow (jaundice). Have dark pee (urine) or pale poop (stool). How is this treated? Treatment for this condition depends on how bad you feel. If you have symptoms, you may need: Home care, if symptoms are not very bad. Do not eat for 12-24 hours. Drink only water and clear liquids. After 1 or 2 days, start to eat simple or clear foods. Try broth and crackers. You may need medicines for pain or stomach upset or both. If you have an infection, you will need antibiotics. A hospital stay, if you have very bad pain or a very bad infection. Surgery to remove your gallbladder. You may need this if: Gallstones keep coming back. You have very bad symptoms. Medicines to break up gallstones. Medicines may be used for 6-12 months. A procedure to find and take out gallstones or to break up gallstones. Follow these instructions at home: Medicines Take over-the-counter and prescription medicines only as told by your doctor. If you were prescribed antibiotics, take them as told by your doctor. Do not stop taking them even if you start to feel better. Ask your doctor if you should avoid driving or using machines while you are taking your medicine. Eating and drinking Drink enough fluid to keep your pee pale yellow. Drink water or clear fluids. This is important when you have pain. Eat healthy foods. Choose: Fewer fatty foods, such as fried foods. Fewer refined carbs. Avoid breads and grains that are highly processed, such   as white bread and white rice. Choose whole grains, such as whole-wheat bread and brown rice. More fiber. Almonds, fresh fruit, and beans are healthy sources. General instructions Keep a healthy weight. Keep all follow-up visits. You may need to see a specialist or a surgeon. Where to find more information [employer] and Digestive and Kidney Diseases: niddk.nih.gov Contact a  doctor if: You have sudden pain in the upper right part of your belly. Pain might spread to your right shoulder, back, or chest. Your pain lasts more than 2 hours. You have been diagnosed with gallstones that have no symptoms and you get: Belly pain. Discomfort, burning, or fullness in the upper part of your abdomen. You keep feeling like you may vomit. You have dark pee or pale poop. Get help right away if: You have pain in your abdomen, that: Lasts more than 5 hours. Keeps getting worse. You have a fever or chills. You can't stop vomiting. Your skin or the white parts of your eyes turn yellow. This information is not intended to replace advice given to you by your health care provider. Make sure you discuss any questions you have with your health care provider. Document Revised: 10/29/2021 Document Reviewed: 10/29/2021 Elsevier Patient Education  2023 [employer]  

## 2022-06-24 ENCOUNTER — Other Ambulatory Visit: Payer: Self-pay | Admitting: Surgery

## 2022-07-09 DIAGNOSIS — L570 Actinic keratosis: Secondary | ICD-10-CM | POA: Diagnosis not present

## 2022-07-09 DIAGNOSIS — L578 Other skin changes due to chronic exposure to nonionizing radiation: Secondary | ICD-10-CM | POA: Diagnosis not present

## 2022-07-09 DIAGNOSIS — Z85828 Personal history of other malignant neoplasm of skin: Secondary | ICD-10-CM | POA: Diagnosis not present

## 2022-07-09 DIAGNOSIS — L280 Lichen simplex chronicus: Secondary | ICD-10-CM | POA: Diagnosis not present

## 2022-07-09 DIAGNOSIS — L814 Other melanin hyperpigmentation: Secondary | ICD-10-CM | POA: Diagnosis not present

## 2022-07-09 DIAGNOSIS — D229 Melanocytic nevi, unspecified: Secondary | ICD-10-CM | POA: Diagnosis not present

## 2022-07-09 DIAGNOSIS — L821 Other seborrheic keratosis: Secondary | ICD-10-CM | POA: Diagnosis not present

## 2022-07-11 ENCOUNTER — Telehealth: Payer: Self-pay | Admitting: Surgery

## 2022-07-11 ENCOUNTER — Other Ambulatory Visit: Payer: Self-pay | Admitting: Surgery

## 2022-07-11 MED ORDER — ONDANSETRON HCL 4 MG PO TABS: 4.0000 mg | ORAL_TABLET | Freq: Three times a day (TID) | ORAL | 0 refills | Status: DC | PRN

## 2022-07-11 NOTE — Telephone Encounter (Signed)
Patient doesn't see Dr Claudine Mouton for his gallbladder until 07/16/22.  Wife states that her husband is having additional abdominal pain and a lot of nausea. Was wondering if can call in some anti nausea medication until he sees the doctor.  Please advise and call patient.  They use [facility] in [place].

## 2022-07-11 NOTE — Telephone Encounter (Signed)
Patient notified that Dr Claudine Mouton has sent in some Zofran for nausea to his pharmacy.

## 2022-07-11 NOTE — Progress Notes (Signed)
For nausea

## 2022-07-16 ENCOUNTER — Ambulatory Visit: Payer: Self-pay | Admitting: Surgery

## 2022-07-16 ENCOUNTER — Encounter: Payer: Self-pay | Admitting: Surgery

## 2022-07-16 ENCOUNTER — Ambulatory Visit (INDEPENDENT_AMBULATORY_CARE_PROVIDER_SITE_OTHER): Payer: PPO | Admitting: Surgery

## 2022-07-16 VITALS — BP 151/79 | HR 77 | Temp 98.1°F | Ht 65.0 in | Wt 175.6 lb

## 2022-07-16 DIAGNOSIS — H3411 Central retinal artery occlusion, right eye: Secondary | ICD-10-CM | POA: Diagnosis not present

## 2022-07-16 DIAGNOSIS — H2513 Age-related nuclear cataract, bilateral: Secondary | ICD-10-CM | POA: Diagnosis not present

## 2022-07-16 DIAGNOSIS — H524 Presbyopia: Secondary | ICD-10-CM | POA: Diagnosis not present

## 2022-07-16 DIAGNOSIS — K8010 Calculus of gallbladder with chronic cholecystitis without obstruction: Secondary | ICD-10-CM | POA: Diagnosis not present

## 2022-07-16 DIAGNOSIS — H0288A Meibomian gland dysfunction right eye, upper and lower eyelids: Secondary | ICD-10-CM | POA: Diagnosis not present

## 2022-07-16 DIAGNOSIS — H0288B Meibomian gland dysfunction left eye, upper and lower eyelids: Secondary | ICD-10-CM | POA: Diagnosis not present

## 2022-07-16 NOTE — Patient Instructions (Signed)
Our surgery scheduler Barbara will call you within 24-48 hours to get you scheduled. If you have not heard from her after 48 hours, please call our office. Have the blue sheet available when she calls to write down important information.  Minimally Invasive Cholecystectomy Minimally invasive cholecystectomy is surgery to remove the gallbladder. The gallbladder is a pear-shaped organ that lies beneath the liver on the right side of the body. The gallbladder stores bile, which is a fluid that helps the body digest fats. Cholecystectomy is often done to treat inflammation (irritation and swelling) of the gallbladder (cholecystitis). This condition is usually caused by a buildup of gallstones (cholelithiasis) in the gallbladder or when the fluid in the gall bladder becomes stagnant because gallstones get stuck in the ducts (tubes) and block the flow of bile. This can result in inflammation and pain. In severe cases, emergency surgery may be required. This procedure is done through small incisions in the abdomen, instead of one large incision. It is also called laparoscopic surgery. A thin scope with a camera (laparoscope) is inserted through one incision. Then surgical instruments are inserted through the other incisions. In some cases, a minimally invasive surgery may need to be changed to a surgery that is done through a larger incision. This is called open surgery. Tell a health care provider about: Any allergies you have. All medicines you are taking, including vitamins, herbs, eye drops, creams, and over-the-counter medicines. Any problems you or family members have had with anesthetic medicines. Any bleeding problems you have. Any surgeries you have had. Any medical conditions you have. Whether you are pregnant or may be pregnant. What are the risks? Generally, this is a safe procedure. However, problems may occur, including: Infection. Bleeding. Allergic reactions to medicines. Damage to nearby  structures or organs. A gallstone remaining in the common bile duct. The common bile duct carries bile from the gallbladder to the small intestine. A bile leak from the liver or cystic duct after your gallbladder is removed. What happens before the procedure? When to stop eating and drinking Follow instructions from your health care provider about what you may eat and drink before your procedure. These may include: 8 hours before the procedure Stop eating most foods. Do not eat meat, fried foods, or fatty foods. Eat only light foods, such as toast or crackers. All liquids are okay except energy drinks and alcohol. 6 hours before the procedure Stop eating. Drink only clear liquids, such as water, clear fruit juice, black coffee, plain tea, and sports drinks. Do not drink energy drinks or alcohol. 2 hours before the procedure Stop drinking all liquids. You may be allowed to take medicines with small sips of water. If you do not follow your health care provider's instructions, your procedure may be delayed or canceled. Medicines Ask your health care provider about: Changing or stopping your regular medicines. This is especially important if you are taking diabetes medicines or blood thinners. Taking medicines such as aspirin and ibuprofen. These medicines can thin your blood. Do not take these medicines unless your health care provider tells you to take them. Taking over-the-counter medicines, vitamins, herbs, and supplements. General instructions If you will be going home right after the procedure, plan to have a responsible adult: Take you home from the hospital or clinic. You will not be allowed to drive. Care for you for the time you are told. Do not use any products that contain nicotine or tobacco for at least 4 weeks before the procedure.   These products include cigarettes, chewing tobacco, and vaping devices, such as e-cigarettes. If you need help quitting, ask your health care  provider. Ask your health care provider: How your surgery site will be marked. What steps will be taken to help prevent infection. These may include: Removing hair at the surgery site. Washing skin with a germ-killing soap. Taking antibiotic medicine. What happens during the procedure?  An IV will be inserted into one of your veins. You will be given one or both of the following: A medicine to help you relax (sedative). A medicine to make you fall asleep (general anesthetic). Your surgeon will make several small incisions in your abdomen. The laparoscope will be inserted through one of the small incisions. The camera on the laparoscope will send images to a monitor in the operating room. This lets your surgeon see inside your abdomen. A gas will be pumped into your abdomen. This will expand your abdomen to give the surgeon more room to perform the surgery. Other tools that are needed for the procedure will be inserted through the other incisions. The gallbladder will be removed through one of the incisions. Your common bile duct may be examined. If stones are found in the common bile duct, they may be removed. After your gallbladder has been removed, the incisions will be closed with stitches (sutures), staples, or skin glue. Your incisions will be covered with a bandage (dressing). The procedure may vary among health care providers and hospitals. What happens after the procedure? Your blood pressure, heart rate, breathing rate, and blood oxygen level will be monitored until you leave the hospital or clinic. You will be given medicines as needed to control your pain. You may have a drain placed in the incision. The drain will be removed a day or two after the procedure. Summary Minimally invasive cholecystectomy, also called laparoscopic cholecystectomy, is surgery to remove the gallbladder using small incisions. Tell your health care provider about all the medical conditions you have and  all the medicines you are taking for those conditions. Before the procedure, follow instructions about when to stop eating and drinking and changing or stopping medicines. Plan to have a responsible adult care for you for the time you are told after you leave the hospital or clinic. This information is not intended to replace advice given to you by your health care provider. Make sure you discuss any questions you have with your health care provider. Document Revised: 07/18/2020 Document Reviewed: 07/18/2020 Elsevier Patient Education  2024 [employer]  

## 2022-07-16 NOTE — H&P (View-Only) (Signed)
Patient ID: Jon Mejia, male   DOB: 01/03/1941, 81 y.o.   MRN: [id number]  Chief Complaint: Substernal pain, resolved.  Gallstones.  History of Present Illness He had been doing well until he had a peanut butter sandwich in the evening, right upper quadrant pain with associated nausea that lasted into the next day.  He is now ready to proceed with cholecystectomy.   [name] returns today reporting an excellent response to regular omeprazole.  Denies any substernal pain, nor any nausea, vomiting, fevers or chills since taking the Prilosec. They present today, he is accompanied by his wife to discuss the possibility of surgery.  At the present time he is still in rehab/recovery from his valve replacement.  He works in his garden and has to pause at times due to fatigue.  He does not go on long walks because of the same.  He is currently been avoiding any excess fats in his diet, and denies any epigastric or right upper quadrant pain.   The HPI was carried over from prior visit. Jon Mejia is a 81 y.o. male with history of intermittent substernal pain, and some diarrhea with urgency.  Denies any fatty food intolerance, actually admits that dry toast would provoke the midline epigastric and substernal pain.  Has not attempted to utilize any antacid, Pepcid or proton pump inhibitor.  Denies any right upper quadrant pain, no radiation to the right upper quadrant or right scapular area.  He has some urgency with loose stools that is unpredictable, he reports are occasionally black and he is currently taking Eliquis.  His last colonoscopy is remote, has never had an upper endoscopy.  Associated nausea without vomiting.  Had a repeat/follow-up ultrasound obtained April 26 showing gallstones, otherwise uncomplicated.  Reports his substernal pain is made significantly worse with supine positioning.  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 McAlhany and Dr. Weldner      Past Surgical History:  Procedure Laterality Date   INTRAOPERATIVE TRANSTHORACIC ECHOCARDIOGRAM N/A 11/06/2021   Procedure: INTRAOPERATIVE TRANSTHORACIC ECHOCARDIOGRAM;  Surgeon: McAlhany, Christopher D, MD;  Location: [facility] OR;  Service: Open Heart Surgery;  Laterality: N/A;   RIGHT/LEFT HEART CATH AND CORONARY ANGIOGRAPHY N/A 09/17/2021   Procedure: RIGHT/LEFT HEART CATH AND CORONARY ANGIOGRAPHY;  Surgeon: McAlhany, Christopher D, MD;  Location: [facility] INVASIVE [facility];  Service: Cardiovascular;  Laterality: N/A;   SKIN CANCER EXCISION  07/2020   TRANSCATHETER AORTIC VALVE REPLACEMENT, TRANSFEMORAL N/A 11/06/2021   Procedure: Transcatheter Aortic Valve Replacement, Transfemoral;  Surgeon: McAlhany, Christopher D, MD;  Location: [facility] OR;  Service: Open Heart Surgery;  Laterality: N/A;    No Known Allergies  Current Outpatient Medications  Medication Sig Dispense Refill   amLODipine (NORVASC) 5 MG tablet Take 1 tablet (5 mg total) by mouth daily. 90 tablet 3   apixaban (ELIQUIS) 5 MG TABS tablet TAKE ONE (1) TABLET BY MOUTH TWO TIMES PER DAY 180 tablet 1   atorvastatin (LIPITOR) 40 MG tablet TAKE 1 TABLET BY MOUTH DAILY 90 tablet 4   Cyanocobalamin (B-12) 3000 MCG CAPS Take 3,000 mcg by mouth daily as needed (energy).       metoprolol succinate (TOPROL-XL) 25 MG 24 hr tablet Take 1.5 tablets (37.5 mg total) by mouth daily. 135 tablet 1   omeprazole (PRILOSEC) 40 MG capsule TAKE 1 CAPSULE BY MOUTH DAILY. 30 capsule 0   ondansetron (ZOFRAN) 4 MG tablet Take 1 tablet (4 mg total) by mouth every 8 (eight) hours as needed for nausea or vomiting. 20 tablet 0   No current facility-administered medications for this visit.     Family History Family History  Problem Relation Age of Onset   Arrhythmia Mother        A-Fib   Lung cancer Father    Diabetes Son    Colon cancer Neg Hx    Prostate cancer Neg Hx       Social History Social History   Tobacco Use   Smoking status: Former    Packs/day: 1.00    Years: 20.00    Additional pack years: 0.00    Total pack years: 20.00    Types: Cigarettes    Quit date: 03/12/1983    Years since quitting: 39.3   Smokeless tobacco: Never  Vaping Use   Vaping Use: Never used  Substance Use Topics   Alcohol use: No   Drug use: No        Review of Systems  Constitutional:  Positive for malaise/fatigue.  HENT: Negative.    Eyes: Negative.   Respiratory:  Positive for shortness of breath.   Cardiovascular: Negative.   Gastrointestinal: Negative.  Negative for abdominal pain and blood in stool.  Genitourinary: Negative.   Skin: Negative.   Neurological: Negative.   Psychiatric/Behavioral: Negative.       Physical Exam Blood pressure (!) 151/79, pulse 77, temperature 98.1 F (36.7 C), temperature source Oral, height 5' 5" (1.651 m), weight 175 lb 9.6 oz (79.7 kg), SpO2 96 %. Last Weight  Most recent update: 07/16/2022  3:34 PM    Weight  79.7 kg (175 lb 9.6 oz)             CONSTITUTIONAL: Well developed, and nourished, appropriately responsive and aware without distress.   EYES: Sclera non-icteric.   EARS, NOSE, MOUTH AND THROAT:  The oropharynx is clear. Oral mucosa is pink and moist.    Hearing is intact to voice.  NECK: Trachea is midline, and there is no jugular venous distension.  LYMPH NODES:  Lymph nodes in the neck are not appreciated. RESPIRATORY:   Normal respiratory effort without pathologic use of accessory muscles. CARDIOVASCULAR:  Well perfused.  GI: The abdomen is  soft, nontender, and nondistended. There were no palpable masses.  I did not appreciate hepatosplenomegaly.  There is no right upper quadrant tenderness  whatsoever. MUSCULOSKELETAL:  Symmetrical muscle tone appreciated in all four extremities.    SKIN: Skin turgor is normal. No pathologic skin lesions appreciated.  NEUROLOGIC:  Motor and sensation appear grossly normal.  Cranial nerves are grossly without defect. PSYCH:  Alert and oriented to person, place and time. Affect is appropriate for situation.  Data Reviewed I have personally reviewed what is currently available of the patient's imaging, recent labs and medical records.   Labs:     Latest Ref Rng & Units 11/07/2021    7:01 AM 11/06/2021    9:45 AM 11/06/2021    9:25 AM  CBC  WBC 4.0 - 10.5 K/uL 9.7     Hemoglobin 13.0 - 17.0 g/dL 12.4  11.9  11.2   Hematocrit 39.0 - 52.0 % 35.8  35.0  33.0     Platelets 150 - 400 K/uL 189         Latest Ref Rng & Units 05/16/2022    1:56 PM 11/07/2021    7:01 AM 11/06/2021    9:45 AM  CMP  Glucose 70 - 99 mg/dL 98  114  138   BUN 8 - 27 mg/dL 12  14  23   Creatinine 0.76 - 1.27 mg/dL 1.06  0.94  0.90   Sodium 134 - 144 mmol/L 142  135  139   Potassium 3.5 - 5.2 mmol/L 4.7  3.7  4.4   Chloride 96 - 106 mmol/L 107  107  107   CO2 20 - 29 mmol/L 19  22    Calcium 8.6 - 10.2 mg/dL 9.7  8.8    Total Protein 6.0 - 8.5 g/dL 6.9     Total Bilirubin 0.0 - 1.2 mg/dL 0.7     Alkaline Phos 44 - 121 IU/L 116     AST 0 - 40 IU/L 27     ALT 0 - 44 IU/L 19         Imaging: Radiological images reviewed:  Narrative & Impression  CLINICAL DATA:  Right upper quadrant pain.   EXAM: ULTRASOUND ABDOMEN LIMITED RIGHT UPPER QUADRANT   COMPARISON:  Ultrasound abdomen 10/16/2021   FINDINGS: Gallbladder:   Stones and sludge within the gallbladder lumen. No gallbladder wall thickening or pericholecystic fluid. Negative sonographic Murphy's sign.   Common bile duct:   Diameter: 3.7 mm   Liver:   Increased echogenicity. No focal lesion. Portal vein is patent on color Doppler imaging with normal direction of blood flow towards the liver.    Other: None.   IMPRESSION: 1. Cholelithiasis without secondary signs of acute cholecystitis. 2. Increased hepatic parenchymal echogenicity suggestive of steatosis.     Electronically Signed   By: Drew  Davis M.D.   On: 05/24/2022 12:09   Within last 24 hrs: No results found.  Assessment    Gallstones of nonclassic symptomatology, now unlikely to be the cause of his prior substernal chest pain, his chest pain seems to be resolved with omeprazole.   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 This was discussed thoroughly.  Optimal plan is for robotic cholecystectomy utilizing ICG imaging. Risks and benefits have been discussed with the patient which include but are not limited to anesthesia, bleeding, infection, biliary ductal injury, resulting in leak or stenosis, other associated unanticipated injuries affiliated with laparoscopic surgery.   Reviewed that removing the gallbladder will only address the symptoms related to the gallbladder itself.  I believe there is the desire to proceed, accepting the risks with understanding.  Questions elicited and answered to satisfaction.    No guarantees ever expressed or implied. Face-to-face time spent with the patient and accompanying care providers(if  present) was 20 minutes, with more   than 50% of the time spent counseling, educating, and coordinating care of the patient.    These notes generated with voice recognition software. I apologize for typographical errors.  Ponce Skillman M.D., FACS 07/16/2022, 4:00 PM     

## 2022-07-16 NOTE — Progress Notes (Signed)
Patient ID: Jon Mejia, male   DOB: 11-05-1940, 82 y.o.   MRN: [id number]  Chief Complaint: Substernal pain, resolved.  Gallstones.  History of Present Illness He had been doing well until he had a peanut butter sandwich in the evening, right upper quadrant pain with associated nausea that lasted into the next day.  He is now ready to proceed with cholecystectomy.   Jon Mejia reporting an excellent response to regular omeprazole.  Denies any substernal pain, nor any nausea, vomiting, fevers or chills since taking the Prilosec. They present Mejia, he is accompanied by his wife to discuss the possibility of surgery.  At the present time he is still in rehab/recovery from his valve replacement.  He works in his garden and has to pause at times due to fatigue.  He does not go on long walks because of the same.  He is currently been avoiding any excess fats in his diet, and denies any epigastric or right upper quadrant pain.   The HPI was carried over from prior visit. Jon Mejia is a 82 y.o. male with history of intermittent substernal pain, and some diarrhea with urgency.  Denies any fatty food intolerance, actually admits that dry toast would provoke the midline epigastric and substernal pain.  Has not attempted to utilize any antacid, Pepcid or proton pump inhibitor.  Denies any right upper quadrant pain, no radiation to the right upper quadrant or right scapular area.  He has some urgency with loose stools that is unpredictable, he reports are occasionally black and he is currently taking Eliquis.  His last colonoscopy is remote, has never had an upper endoscopy.  Associated nausea without vomiting.  Had a repeat/follow-up ultrasound obtained April 26 showing gallstones, otherwise uncomplicated.  Reports his substernal pain is made significantly worse with supine positioning.  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 Clifton James and Dr. Leafy Ro      Past Surgical History:  Procedure Laterality Date   INTRAOPERATIVE TRANSTHORACIC ECHOCARDIOGRAM N/A 11/06/2021   Procedure: INTRAOPERATIVE TRANSTHORACIC ECHOCARDIOGRAM;  Surgeon: Kathleene Hazel, MD;  Location: [facility] OR;  Service: Open Heart Surgery;  Laterality: N/A;   RIGHT/LEFT HEART CATH AND CORONARY ANGIOGRAPHY N/A 09/17/2021   Procedure: RIGHT/LEFT HEART CATH AND CORONARY ANGIOGRAPHY;  Surgeon: Kathleene Hazel, MD;  Location: [facility] INVASIVE CV LAB;  Service: Cardiovascular;  Laterality: N/A;   SKIN CANCER EXCISION  07/2020   TRANSCATHETER AORTIC VALVE REPLACEMENT, TRANSFEMORAL N/A 11/06/2021   Procedure: Transcatheter Aortic Valve Replacement, Transfemoral;  Surgeon: Kathleene Hazel, MD;  Location: [facility] OR;  Service: Open Heart Surgery;  Laterality: N/A;    No Known Allergies  Current Outpatient Medications  Medication Sig Dispense Refill   amLODipine (NORVASC) 5 MG tablet Take 1 tablet (5 mg total) by mouth daily. 90 tablet 3   apixaban (ELIQUIS) 5 MG TABS tablet TAKE ONE (1) TABLET BY MOUTH TWO TIMES PER DAY 180 tablet 1   atorvastatin (LIPITOR) 40 MG tablet TAKE 1 TABLET BY MOUTH DAILY 90 tablet 4   Cyanocobalamin (B-12) 3000 MCG CAPS Take 3,000 mcg by mouth daily as needed (energy).  metoprolol succinate (TOPROL-XL) 25 MG 24 hr tablet Take 1.5 tablets (37.5 mg total) by mouth daily. 135 tablet 1   omeprazole (PRILOSEC) 40 MG capsule TAKE 1 CAPSULE BY MOUTH DAILY. 30 capsule 0   ondansetron (ZOFRAN) 4 MG tablet Take 1 tablet (4 mg total) by mouth every 8 (eight) hours as needed for nausea or vomiting. 20 tablet 0   No current facility-administered medications for this visit.     Family History Family History  Problem Relation Age of Onset   Arrhythmia Mother        A-Fib   Lung cancer Father    Diabetes Son    Colon cancer Neg Hx    Prostate cancer Neg Hx       Social History Social History   Tobacco Use   Smoking status: Former    Packs/day: 1.00    Years: 20.00    Additional pack years: 0.00    Total pack years: 20.00    Types: Cigarettes    Quit date: 03/12/1983    Years since quitting: 39.3   Smokeless tobacco: Never  Vaping Use   Vaping Use: Never used  Substance Use Topics   Alcohol use: No   Drug use: No        Review of Systems  Constitutional:  Positive for malaise/fatigue.  HENT: Negative.    Eyes: Negative.   Respiratory:  Positive for shortness of breath.   Cardiovascular: Negative.   Gastrointestinal: Negative.  Negative for abdominal pain and blood in stool.  Genitourinary: Negative.   Skin: Negative.   Neurological: Negative.   Psychiatric/Behavioral: Negative.       Physical Exam Blood pressure (!) 151/79, pulse 77, temperature 98.1 F (36.7 C), temperature source Oral, height 5\' 5"  (1.651 m), weight 175 lb 9.6 oz (79.7 kg), SpO2 96 %. Last Weight  Most recent update: 07/16/2022  3:34 PM    Weight  79.7 kg (175 lb 9.6 oz)             CONSTITUTIONAL: Well developed, and nourished, appropriately responsive and aware without distress.   EYES: Sclera non-icteric.   EARS, NOSE, MOUTH AND THROAT:  The oropharynx is clear. Oral mucosa is pink and moist.    Hearing is intact to voice.  NECK: Trachea is midline, and there is no jugular venous distension.  LYMPH NODES:  Lymph nodes in the neck are not appreciated. RESPIRATORY:   Normal respiratory effort without pathologic use of accessory muscles. CARDIOVASCULAR:  Well perfused.  GI: The abdomen is  soft, nontender, and nondistended. There were no palpable masses.  I did not appreciate hepatosplenomegaly.  There is no right upper quadrant tenderness  whatsoever. MUSCULOSKELETAL:  Symmetrical muscle tone appreciated in all four extremities.    SKIN: Skin turgor is normal. No pathologic skin lesions appreciated.  NEUROLOGIC:  Motor and sensation appear grossly normal.  Cranial nerves are grossly without defect. PSYCH:  Alert and oriented to person, place and time. Affect is appropriate for situation.  Data Reviewed I have personally reviewed what is currently available of the patient's imaging, recent labs and medical records.   Labs:     Latest Ref Rng & Units 11/07/2021    7:01 AM 11/06/2021    9:45 AM 11/06/2021    9:25 AM  CBC  WBC 4.0 - 10.5 K/uL 9.7     Hemoglobin 13.0 - 17.0 g/dL 40.9  81.1  91.4   Hematocrit 39.0 - 52.0 % 35.8  35.0  33.0  Platelets 150 - 400 K/uL 189         Latest Ref Rng & Units 05/16/2022    1:56 PM 11/07/2021    7:01 AM 11/06/2021    9:45 AM  CMP  Glucose 70 - 99 mg/dL 98  161  096   BUN 8 - 27 mg/dL 12  14  23    Creatinine 0.76 - 1.27 mg/dL 0.45  4.09  8.11   Sodium 134 - 144 mmol/L 142  135  139   Potassium 3.5 - 5.2 mmol/L 4.7  3.7  4.4   Chloride 96 - 106 mmol/L 107  107  107   CO2 20 - 29 mmol/L 19  22    Calcium 8.6 - 10.2 mg/dL 9.7  8.8    Total Protein 6.0 - 8.5 g/dL 6.9     Total Bilirubin 0.0 - 1.2 mg/dL 0.7     Alkaline Phos 44 - 121 IU/L 116     AST 0 - 40 IU/L 27     ALT 0 - 44 IU/L 19         Imaging: Radiological images reviewed:  Narrative & Impression  CLINICAL DATA:  Right upper quadrant pain.   EXAM: ULTRASOUND ABDOMEN LIMITED RIGHT UPPER QUADRANT   COMPARISON:  Ultrasound abdomen 10/16/2021   FINDINGS: Gallbladder:   Stones and sludge within the gallbladder lumen. No gallbladder wall thickening or pericholecystic fluid. Negative sonographic Murphy's sign.   Common bile duct:   Diameter: 3.7 mm   Liver:   Increased echogenicity. No focal lesion. Portal vein is patent on color Doppler imaging with normal direction of blood flow towards the liver.    Other: None.   IMPRESSION: 1. Cholelithiasis without secondary signs of acute cholecystitis. 2. Increased hepatic parenchymal echogenicity suggestive of steatosis.     Electronically Signed   By: Annia Belt M.D.   On: 05/24/2022 12:09   Within last 24 hrs: No results found.  Assessment    Gallstones of nonclassic symptomatology, now unlikely to be the cause of his prior substernal chest pain, his chest pain seems to be resolved with omeprazole.   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 This was discussed thoroughly.  Optimal plan is for robotic cholecystectomy utilizing ICG imaging. Risks and benefits have been discussed with the patient which include but are not limited to anesthesia, bleeding, infection, biliary ductal injury, resulting in leak or stenosis, other associated unanticipated injuries affiliated with laparoscopic surgery.   Reviewed that removing the gallbladder will only address the symptoms related to the gallbladder itself.  I believe there is the desire to proceed, accepting the risks with understanding.  Questions elicited and answered to satisfaction.    No guarantees ever expressed or implied. Face-to-face time spent with the patient and accompanying care providers(if  present) was 20 minutes, with more  than 50% of the time spent counseling, educating, and coordinating care of the patient.    These notes generated with voice recognition software. I apologize for typographical errors.  Campbell Lerner M.D., FACS 07/16/2022, 4:00 PM

## 2022-07-17 ENCOUNTER — Telehealth: Payer: Self-pay | Admitting: Surgery

## 2022-07-17 NOTE — Telephone Encounter (Signed)
Patient has been advised of Pre-Admission date/time, and Surgery date at [facility].  Surgery Date: 07/24/22 Preadmission Testing Date: 07/19/22 (phone 8a-1p)  Patient has been made aware to call [phone], between 1-3:00pm the day before surgery, to find out what time to arrive for surgery.    

## 2022-07-19 ENCOUNTER — Telehealth: Payer: Self-pay | Admitting: *Deleted

## 2022-07-19 ENCOUNTER — Encounter: Payer: Self-pay | Admitting: Surgery

## 2022-07-19 ENCOUNTER — Other Ambulatory Visit: Payer: Self-pay

## 2022-07-19 ENCOUNTER — Encounter
Admission: RE | Admit: 2022-07-19 | Discharge: 2022-07-19 | Disposition: A | Discharge status: Home / Self Care | Payer: PPO | Source: Ambulatory Visit | Attending: Surgery | Admitting: Surgery

## 2022-07-19 VITALS — Ht 65.0 in | Wt 175.0 lb

## 2022-07-19 DIAGNOSIS — I10 Essential (primary) hypertension: Secondary | ICD-10-CM

## 2022-07-19 NOTE — Telephone Encounter (Signed)
Patient with diagnosis of afib on Eliquis for anticoagulation.    Procedure: XI ROBOTIC ASSISTED LAPAROSCOPIC CHOLECYSTECTOMY   Date of procedure: 07/24/22  CHA2DS2-VASc Score = 6  This indicates a 9.7% annual risk of stroke. The patient's score is based upon: CHF History: 0 HTN History: 1 Diabetes History: 0 Stroke History: 2 Vascular Disease History: 1 Age Score: 2 Gender Score: 0   Stroke 2020  CrCl 2mL/min Platelet count 189K  Per office protocol, patient can hold Eliquis for 2 days prior to procedure. Resume as soon as safely possible after due to elevated CV risk.   **This guidance is not considered finalized until pre-operative APP has relayed final recommendations.**

## 2022-07-19 NOTE — Patient Instructions (Addendum)
Your procedure is scheduled on: Wednesday July 24, 2022. Report to the Registration Desk on the 1st floor of the [facility]. To find out your arrival time, please call [phone] between 1PM - 3PM on: Tuesday July 23, 2022. If your arrival time is 6:00 am, do not arrive before that time as the Medical Mall entrance doors do not open until 6:00 am.  REMEMBER: Instructions that are not followed completely may result in serious medical risk, up to and including death; or upon the discretion of your surgeon and anesthesiologist your surgery may need to be rescheduled.  Do not eat food or drink fluids after midnight the night before surgery.  No gum chewing or hard candies.   One week prior to surgery: Stop Anti-inflammatories (NSAIDS) such as Advil, Aleve, Ibuprofen, Motrin, Naproxen, Naprosyn and Aspirin based products such as Excedrin, Goody's Powder, BC Powder. Stop ANY OVER THE COUNTER supplements until after surgery. You may however, continue to take Tylenol if needed for pain up until the day of surgery.  Continue taking all prescribed medications with the exception of the following: Stop apixaban (ELIQUIS) 5 MG TABS 3 days prior to surgery (take last dose 07/20/22) as instructed by your doctor.  Follow recommendations from Cardiologist or PCP regarding stopping blood thinners.  TAKE ONLY THESE MEDICATIONS THE MORNING OF SURGERY WITH A SIP OF WATER:  amLODipine (NORVASC) 5 MG  atorvastatin (LIPITOR) 40 MG  metoprolol succinate (TOPROL-XL) 25 MG  omeprazole (PRILOSEC) 40 MG Antacid (take one the night before and one on the morning of surgery - helps to prevent nausea after surgery.)  No Alcohol for 24 hours before or after surgery.  No Smoking including e-cigarettes for 24 hours before surgery.  No chewable tobacco products for at least 6 hours before surgery.  No nicotine patches on the day of surgery.  Do not use any "recreational" drugs for at least a week (preferably 2  weeks) before your surgery.  Please be advised that the combination of cocaine and anesthesia may have negative outcomes, up to and including death. If you test positive for cocaine, your surgery will be cancelled.  On the morning of surgery brush your teeth with toothpaste and water, you may rinse your mouth with mouthwash if you wish. Do not swallow any toothpaste or mouthwash.  Use CHG Soap or wipes as directed on instruction sheet.  Do not wear jewelry, make-up, hairpins, clips or nail polish.  Do not wear lotions, powders, or perfumes.   Do not shave body hair from the neck down 48 hours before surgery.  Contact lenses, hearing aids and dentures may not be worn into surgery.  Do not bring valuables to the hospital. [facility] is not responsible for any missing/lost belongings or valuables.   Bring your C-PAP to the hospital in case you may have to spend the night.   Notify your doctor if there is any change in your medical condition (cold, fever, infection).  Wear comfortable clothing (specific to your surgery type) to the hospital.  After surgery, you can help prevent lung complications by doing breathing exercises.  Take deep breaths and cough every 1-2 hours. Your doctor may order a device called an Incentive Spirometer to help you take deep breaths. When coughing or sneezing, hold a pillow firmly against your incision with both hands. This is called "splinting." Doing this helps protect your incision. It also decreases belly discomfort.  If you are being admitted to the hospital overnight, leave your suitcase in  the car. After surgery it may be brought to your room.  In case of increased patient census, it may be necessary for you, the patient, to continue your postoperative care in the Same Day Surgery department.  If you are being discharged the day of surgery, you will not be allowed to drive home. You will need a responsible individual to drive you home and stay with  you for 24 hours after surgery.   If you are taking public transportation, you will need to have a responsible individual with you.  Please call the Pre-admissions Testing Dept. at [phone] if you have any questions about these instructions.  Surgery Visitation Policy:  Patients having surgery or a procedure may have two visitors.  Children under the age of 34 must have an adult with them who is not the patient.  Inpatient Visitation:    Visiting hours are 7 a.m. to 8 p.m. Up to four visitors are allowed at one time in a patient room. The visitors may rotate out with other people during the day.  One visitor age 59 or older may stay with the patient overnight and must be in the room by 8 p.m.    Preparing for Surgery with CHLORHEXIDINE GLUCONATE (CHG) Soap  Chlorhexidine Gluconate (CHG) Soap  o An antiseptic cleaner that kills germs and bonds with the skin to continue killing germs even after washing  o Used for showering the night before surgery and morning of surgery  Before surgery, you can play an important role by reducing the number of germs on your skin.  CHG (Chlorhexidine gluconate) soap is an antiseptic cleanser which kills germs and bonds with the skin to continue killing germs even after washing.  Please do not use if you have an allergy to CHG or antibacterial soaps. If your skin becomes reddened/irritated stop using the CHG.  1. Shower the NIGHT BEFORE SURGERY and the MORNING OF SURGERY with CHG soap.  2. If you choose to wash your hair, wash your hair first as usual with your normal shampoo.  3. After shampooing, rinse your hair and body thoroughly to remove the shampoo.  4. Use CHG as you would any other liquid soap. You can apply CHG directly to the skin and wash gently with a scrungie or a clean washcloth.  5. Apply the CHG soap to your body only from the neck down. Do not use on open wounds or open sores. Avoid contact with your eyes, ears, mouth, and  genitals (private parts). Wash face and genitals (private parts) with your normal soap.  6. Wash thoroughly, paying special attention to the area where your surgery will be performed.  7. Thoroughly rinse your body with warm water.  8. Do not shower/wash with your normal soap after using and rinsing off the CHG soap.  9. Pat yourself dry with a clean towel.  10. Wear clean pajamas to bed the night before surgery.  12. Place clean sheets on your bed the night of your first shower and do not sleep with pets.  13. Shower again with the CHG soap on the day of surgery prior to arriving at the hospital.  14. Do not apply any deodorants/lotions/powders.  15. Please wear clean clothes to the hospital.

## 2022-07-19 NOTE — Telephone Encounter (Signed)
-----   Message from Verlee Monte, NP sent at 07/19/2022  1:15 PM EDT ----- Regarding: Request for pre-operative cardiac clearance Request for pre-operative cardiac clearance:  1. What type of surgery is being performed?  XI ROBOTIC ASSISTED LAPAROSCOPIC CHOLECYSTECTOMY  2. When is this surgery scheduled?  07/23/2021  3. Type of clearance being requested (medical, pharmacy, both)? BOTH   4. Are there any medications that need to be held prior to surgery? APIXABAN  5. Practice name and name of physician performing surgery?  Performing surgeon: Dr. Campbell Lerner, MD Requesting clearance: Quentin Mulling, FNP-C    6. Anesthesia type (none, local, MAC, general)? GENERAL  7. What is the office phone and fax number?   Phone: [phone] Fax: [phone]  ATTENTION: Unable to create telephone message as per your standard workflow. Directed by [facility] providers to send requests for cardiac clearance to this pool for appropriate distribution to provider covering pre-operative clearances.   Quentin Mulling, MSN, APRN, FNP-C, CEN [facility]  Peri-operative Services Nurse Practitioner Phone: [phone] 07/19/22 1:15 PM

## 2022-07-19 NOTE — Telephone Encounter (Signed)
I s/w both the pt and his wife who are both agreeable to add on tele appt 07/22/22 @ 3:20, due to med hold and date of procedure.   Pt has been advised beginning 07/22/22 he will not Eliquis until after the procedure which Dr. Claudine Mouton will let him now when safe to resume. Pt and his verbalized understanding to instructions.   Med rec and consent are done.     Patient Consent for Virtual Visit        QUINCEY QUESINBERRY has provided verbal consent on 07/19/2022 for a virtual visit (video or telephone).   CONSENT FOR VIRTUAL VISIT FOR:  [name]  By participating in this virtual visit I agree to the following:  I hereby voluntarily request, consent and authorize [facility] and its employed or contracted physicians, physician assistants, nurse practitioners or other licensed health care professionals (the Practitioner), to provide me with telemedicine health care services (the "Services") as deemed necessary by the treating Practitioner. I acknowledge and consent to receive the Services by the Practitioner via telemedicine. I understand that the telemedicine visit will involve communicating with the Practitioner through live audiovisual communication technology and the disclosure of certain medical information by electronic transmission. I acknowledge that I have been given the opportunity to request an in-person assessment or other available alternative prior to the telemedicine visit and am voluntarily participating in the telemedicine visit.  I understand that I have the right to withhold or withdraw my consent to the use of telemedicine in the course of my care at any time, without affecting my right to future care or treatment, and that the Practitioner or I may terminate the telemedicine visit at any time. I understand that I have the right to inspect all information obtained and/or recorded in the course of the telemedicine visit and may receive copies of available information  for a reasonable fee.  I understand that some of the potential risks of receiving the Services via telemedicine include:  Delay or interruption in medical evaluation due to technological equipment failure or disruption; Information transmitted may not be sufficient (e.g. poor resolution of images) to allow for appropriate medical decision making by the Practitioner; and/or  In rare instances, security protocols could fail, causing a breach of personal health information.  Furthermore, I acknowledge that it is my responsibility to provide information about my medical history, conditions and care that is complete and accurate to the best of my ability. I acknowledge that Practitioner's advice, recommendations, and/or decision may be based on factors not within their control, such as incomplete or inaccurate data provided by me or distortions of diagnostic images or specimens that may result from electronic transmissions. I understand that the practice of medicine is not an exact science and that Practitioner makes no warranties or guarantees regarding treatment outcomes. I acknowledge that a copy of this consent can be made available to me via my patient portal [facility] MyChart), or I can request a printed copy by calling the office of [facility].    I understand that my insurance will be billed for this visit.   I have read or had this consent read to me. I understand the contents of this consent, which adequately explains the benefits and risks of the Services being provided via telemedicine.  I have been provided ample opportunity to ask questions regarding this consent and the Services and have had my questions answered to my satisfaction. I give my informed consent for the  services to be provided through the use of telemedicine in my medical care

## 2022-07-19 NOTE — Telephone Encounter (Signed)
I s/w both the pt and his wife who are both agreeable to add on tele appt 07/22/22 @ 3:20, due to med hold and date of procedure.    Pt has been advised beginning 07/22/22 he will not Eliquis until after the procedure which Dr. Claudine Mouton will let him now when safe to resume. Pt and his verbalized understanding to instructions.    Med rec and consent are done.

## 2022-07-19 NOTE — Progress Notes (Signed)
Perioperative / Anesthesia Services  Pre-Admission Testing Clinical Review / Preoperative Anesthesia Consult  Date: 07/22/22  Patient Demographics:  Name: Jon Mejia DOB:   03-31-1940 MRN:   [id number]  Planned Surgical Procedure(s):    Case: [id number] Date/Time: 07/24/22 1235   Procedures:      XI ROBOTIC ASSISTED LAPAROSCOPIC CHOLECYSTECTOMY     INDOCYANINE GREEN FLUORESCENCE IMAGING (ICG)   Anesthesia type: General   Pre-op diagnosis: Gallstones   Location: [facility] OR ROOM 04 / [facility] FOR ANEST[facility]   Surgeons: Campbell Lerner, MD     NOTE: Available PAT nursing documentation and vital signs have been reviewed. Clinical nursing staff has updated patient's PMH/PSHx, current medication list, and drug allergies/intolerances to ensure comprehensive history available to assist in medical decision making as it pertains to the aforementioned surgical procedure and anticipated anesthetic course. Extensive review of available clinical information personally performed. [place] PMH and PSHx updated with any diagnoses/procedures that  may have been inadvertently omitted during his intake with the pre-admission testing department's nursing staff.  Clinical Discussion:  Jon Mejia is a 82 y.o. male who is submitted for pre-surgical anesthesia review and clearance prior to him undergoing the above procedure. Patient is a Former Smoker (20 pack years; quit 03/1983). Pertinent PMH includes: CAD, atrial fibrillation, aortic stenosis (s/p TAVR) diastolic dysfunction, CVA with transient CRAO, angina, aortic atherosclerosis, LBBB, HTN, HLD, GERD (on daily PPI), cholelithiasis, cervical spondylosis, chronic radicular lower back pain.  Patient is followed by cardiology Mariah Milling, MD). He was last seen in the cardiology clinic on 12/24/2021; notes reviewed. At the time of his clinic visit, patient reporting complaints of shortness of breath and some mild vertiginous symptoms (associated with  bending). Patient denied any chest pain, PND, orthopnea, palpitations, significant peripheral edema, weakness, fatigue, or presyncope/syncope. Patient with a past medical history significant for cardiovascular diagnoses. Documented physical exam was grossly benign, providing no evidence of acute exacerbation and/or decompensation of the patient's known cardiovascular conditions.  Patient suffered an acute CVA on 03/13/2018.  Patient presented with blurred vision in his RIGHT high, later found to be consistent with a central retinal artery occlusion (CRAO).  MRI imaging of brain revealed an acute infarct in the posterior RIGHT frontal lobe, in addition to a small chronic infarct in the cerebellum.  Patient was treated with TPA with noted improvement/resolution of symptoms.  Patient has no significant residual deficits following neurological event.  Patient underwent a diagnostic RIGHT/LEFT heart catheterization on 09/17/2021 that revealed multi-vessel CAD; 30% pRCA, 40% dRCA, 50% pLCx, 30% dLCx, 50% pLAD, 50% mLAD, 50% dLAD. Medical management of patient's non-obstructive coronary artery disease was recommended. Hemodynamics: mRA = 3 mmHg, mPA = 19 mmHg, mPCWP = 11 mmHg, LVEDP = 15 mmHg, CO = 5.22 L/min, CI = 2.75 L/min/m2.  Patient with severe aortic valve stenosis. He ultimately underwent a TAVR on 11/06/2021, at which time a 26 mm Sapien 3 Ultra bioprosthetic valve via a RIGHT transfemoral approach.   Most recent TTE was performed on 12/05/2021 revealing a normal left ventricular systolic function with an EF of 60-65%. There were no regional wall motion abnormalities. Left ventricular diastolic Doppler parameters consistent with abnormal relaxation (G1DD). Right ventricular size and function normal. There was moderate mitral annular calcification with mild resulting regurgitation. Bioprosthetic aortic valve well seated with normal functioning; mean gradient 8.0 mmHg. All transvalvular gradients were noted  to be normal providing no evidence suggestive of valvular stenosis.  Patient with an atrial fibrillation diagnosis; CHA2DS2-VASc Score =  6 (age x 2, HTN, CVA x 2, vascular disease history). His rate and rhythm are currently being maintained on oral metoprolol succinate. He is chronically anticoagulated using apixaban; reported to be compliant with therapy with no evidence or reports of GI bleeding.  Blood pressure reasonably controlled at 132/62 mmHg on currently prescribed CCB (amlodipine) and beta-blocker (metoprolol succinate) therapies. He is on atorvastatin for his HLD diagnosis and further ASCVD prevention.  Patient is not diabetic. Patient does not have an OSAH diagnosis.  Functional capacity somewhat limited by patient's age, vertiginous symptoms, back pain, and other multiple medical comorbidities.  With that said, patient still felt to be able to achieve at least 4 METS of physical activity without experiencing any significant degrees of angina/anginal equivalent symptoms.  No changes were made to his medication regimen.  Patient to follow-up with outpatient cardiology in 1 year or sooner if needed.  Jon Mejia is scheduled for an elective XI ROBOTIC ASSISTED LAPAROSCOPIC CHOLECYSTECTOMY on 07/24/2022 with Dr. Campbell Lerner, MD.  Given patient's past medical history significant for cardiovascular diagnoses, presurgical cardiac clearance was sought by the PAT team. Per cardiology, "RCRI is a class III risk, 6.6% risk of major cardiac event. He is able to complete > 4 METS of physical activity.Given past medical history and time since last visit, based on ACC/AHA guidelines, Jon Mejia would be at acceptable risk for the planned procedure without further cardiovascular testing".   Is on daily again, this patient oral anticoagulation therapy.  He has been instructed on recommendations for holding his DOAC (apixaban) for 3 days prior to his procedure with plans to restart as soon as  postoperative bleeding risk felt to be minimized by his attending surgeon. The patient has been instructed that his last dose of his apixaban should be on 07/20/2022.  Patient denies previous perioperative complications with anesthesia in the past. In review of the available records, it is noted that patient underwent a MAC anesthetic course at [facility] ([facility]) in 10/2021 without documented complications.      07/19/2022   12:36 PM 07/16/2022    3:33 PM 06/20/2022    9:54 AM  Vitals with BMI  Height 5\' 5"  5\' 5"  5\' 5"   Weight 175 lbs 175 lbs 10 oz 180 lbs 3 oz  BMI 29.12 29.22 29.99  Systolic  151 144  Diastolic  79 79  Pulse  77 74    Providers/Specialists:   NOTE: Primary physician provider listed below. Patient may have been seen by APP or partner within same practice.   PROVIDER ROLE / SPECIALTY LAST Barton Fanny, MD General Surgery (Surgeon) 07/16/2022  Malva Limes, MD Primary Care Provider 05/16/2022  Julien Nordmann, MD Cardiology 12/24/2021; update preop APP call on 07/22/2022  Delia Heady, MD Neurology 01/23/2022   Allergies:  Patient has no known allergies.  Current Home Medications:   No current facility-administered medications for this encounter.    amLODipine (NORVASC) 5 MG tablet   apixaban (ELIQUIS) 5 MG TABS tablet   atorvastatin (LIPITOR) 40 MG tablet   Cyanocobalamin (B-12) 3000 MCG CAPS   metoprolol succinate (TOPROL-XL) 25 MG 24 hr tablet   omeprazole (PRILOSEC) 40 MG capsule   ondansetron (ZOFRAN) 4 MG tablet   History:   Past Medical History:  Diagnosis Date   Angina pectoris (HCC)    Aortic atherosclerosis (HCC)    Aortic stenosis    a.) TTE 03/14/2018: mod AS (MPG 14); b.) TTE 09/13/2019: mild AS (  MPG 12.5); c.) TTE 08/16/2021: mod AS (MPG 18.5); d.) R/LHC 09/17/2021: mRA 3, mPA 19, mPCWP 11, LVEDP 15, CO 5.22, CI 2.75; e.) s/p TAVR 11/06/2021   CAD (coronary artery disease) 09/17/2021   a.) R/LHC 09/17/2021: 30%  pRCA, 40% dRCA, 50% pLCx, 30% dLCx, 50% pLAD, 50% mLAD, 50% dLAD --> med mgmt   Cardiac murmur    Cervical spondylosis    Cholelithiasis    CRAO (central retinal artery occlusion), 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   CVA (cerebral vascular accident) (HCC) 03/13/2018   a.) MRI brain 03/14/2018 --> acute infarct in the posterior RIGHT frontal lobe; small chronic cerebellar infarct --> Tx'd with TPA with (+) improvement/resolution of symptoms   Diastolic dysfunction    a.) TTE 01/31/2015: EF 55-60%, MAC, G1DD; b.) TTE 03/14/2018: EF 60-65%, mild LAE, sev MAC, sev AoV cal with mod AS (MPG 14) G2DD; c.) TTE 09/13/2019: EF 55-60%, mild LAE, mild-mod MR, mild AS (MPG 12.5), G2DD; d.) TTE 08/16/2021: EF 60-65%, mild LVH, mod MAC, mild MR, mod AS (MPG 18.5), G1DD; e.) TTE 12/05/2021: EF 60-65%, mod MAC, s/p TAVR (well seated/functioning valve), mild MR, G1DD   GERD (gastroesophageal reflux disease)    History of chicken pox    History of measles    Hyperlipidemia    Hypertension    LBBB (left bundle branch block)    Long term current use of anticoagulant    a.) apixaban   Low back pain radiating to left lower extremity    PAF (paroxysmal atrial fibrillation) (HCC)    a.) CHA2DS2VASc = 6 (age x2, HTN, CVA x2, vascular disease history);  b.) rate/rhythm maintained on oral metoprolol succinate; chronically anticoagulated with apixaban   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   a.) 26mm S3UR via RIGHT TF approach   Skin cancer    Past Surgical History:  Procedure Laterality Date   INTRAOPERATIVE TRANSTHORACIC ECHOCARDIOGRAM N/A 11/06/2021   Procedure: INTRAOPERATIVE TRANSTHORACIC ECHOCARDIOGRAM;  Surgeon: Kathleene Hazel, MD;  Location: [facility] OR;  Service: Open Heart Surgery;  Laterality: N/A;   RIGHT/LEFT HEART CATH AND CORONARY ANGIOGRAPHY N/A 09/17/2021   Procedure: RIGHT/LEFT HEART CATH AND CORONARY ANGIOGRAPHY;  Surgeon: Kathleene Hazel, MD;  Location: [facility] INVASIVE CV LAB;   Service: Cardiovascular;  Laterality: N/A;   SKIN CANCER EXCISION  07/2020   TRANSCATHETER AORTIC VALVE REPLACEMENT, TRANSFEMORAL N/A 11/06/2021   Procedure: Transcatheter Aortic Valve Replacement, Transfemoral;  Surgeon: Kathleene Hazel, MD;  Location: [facility] OR;  Service: Open Heart Surgery;  Laterality: N/A;   Family History  Problem Relation Age of Onset   Arrhythmia Mother        A-Fib   Lung cancer Father    Diabetes Son    Colon cancer Neg Hx    Prostate cancer Neg Hx    Social History   Tobacco Use   Smoking status: Former    Packs/day: 1.00    Years: 20.00    Additional pack years: 0.00    Total pack years: 20.00    Types: Cigarettes    Quit date: 03/12/1983    Years since quitting: 39.3   Smokeless tobacco: Never  Vaping Use   Vaping Use: Never used  Substance Use Topics   Alcohol use: No   Drug use: No    Pertinent Clinical Results:  LABS:   Hospital Outpatient Visit on 07/22/2022  Component Date Value Ref Range Status   WBC 07/22/2022 8.9  4.0 - 10.5 K/uL Final   RBC 07/22/2022 4.47  4.22 - 5.81 MIL/uL Final   Hemoglobin 07/22/2022 13.2  13.0 - 17.0 g/dL Final   HCT 40/98/1191 38.8 (L)  39.0 - 52.0 % Final   MCV 07/22/2022 86.8  80.0 - 100.0 fL Final   MCH 07/22/2022 29.5  26.0 - 34.0 pg Final   MCHC 07/22/2022 34.0  30.0 - 36.0 g/dL Final   RDW 47/82/9562 13.3  11.5 - 15.5 % Final   Platelets 07/22/2022 283  150 - 400 K/uL Final   nRBC 07/22/2022 0.0  0.0 - 0.2 % Final   Neutrophils Relative % 07/22/2022 61  % Final   Neutro Abs 07/22/2022 5.5  1.7 - 7.7 K/uL Final   Lymphocytes Relative 07/22/2022 25  % Final   Lymphs Abs 07/22/2022 2.2  0.7 - 4.0 K/uL Final   Monocytes Relative 07/22/2022 13  % Final   Monocytes Absolute 07/22/2022 1.1 (H)  0.1 - 1.0 K/uL Final   Eosinophils Relative 07/22/2022 1  % Final   Eosinophils Absolute 07/22/2022 0.1  0.0 - 0.5 K/uL Final   Basophils Relative 07/22/2022 0  % Final   Basophils Absolute 07/22/2022 0.0   0.0 - 0.1 K/uL Final   Immature Granulocytes 07/22/2022 0  % Final   Abs Immature Granulocytes 07/22/2022 0.03  0.00 - 0.07 K/uL Final   Performed at [facility], [address] [place], [place] [address]   Sodium 07/22/2022 138  135 - 145 mmol/L Final   Potassium 07/22/2022 3.8  3.5 - 5.1 mmol/L Final   Chloride 07/22/2022 106  98 - 111 mmol/L Final   CO2 07/22/2022 23  22 - 32 mmol/L Final   Glucose, Bld 07/22/2022 102 (H)  70 - 99 mg/dL Final   Glucose reference range applies only to samples taken after fasting for at least 8 hours.   BUN 07/22/2022 13  8 - 23 mg/dL Final   Creatinine, Ser 07/22/2022 0.95  0.61 - 1.24 mg/dL Final   Calcium 57/84/6962 9.3  8.9 - 10.3 mg/dL Final   Total Protein 95/28/4132 7.5  6.5 - 8.1 g/dL Final   Albumin 44/01/270 3.7  3.5 - 5.0 g/dL Final   AST 53/66/4403 20  15 - 41 U/L Final   ALT 07/22/2022 16  0 - 44 U/L Final   Alkaline Phosphatase 07/22/2022 91  38 - 126 U/L Final   Total Bilirubin 07/22/2022 1.4 (H)  0.3 - 1.2 mg/dL Final   GFR, Estimated 07/22/2022 >60  >60 mL/min Final   Comment: (NOTE) Calculated using the CKD-EPI Creatinine Equation (2021)    Anion gap 07/22/2022 9  5 - 15 Final   Performed at [facility], [address] [place], [place] [address]    ECG: Date: 07/22/2022 Time ECG obtained: 0941 AM Rate: 65 bpm Rhythm: normal sinus Axis (leads I and aVF): Normal Intervals: PR 176 ms. QRS 80 ms. QTc 411 ms. ST segment and T wave changes: No evidence of acute ST segment elevation or depression Comparison: Similar to previous tracing obtained on 12/24/2021   IMAGING / PROCEDURES: US ABDOMEN LIMITED RUQ (LIVER/GB) performed on 05/24/2022 Cholelithiasis without secondary signs of acute cholecystitis Common bile duct = 3.77 mm Negative sonographic Murphy sign Increased hepatic parenchymal echogenicity suggestive of steatosis  TRANSTHORACIC ECHOCARDIOGRAM performed on 12/05/2021 S/P TAVR with mean  gradient 7 mmHg; compared to 11/07/21 trivial AI not evident on present study.  Left ventricular ejection fraction, by estimation, is 60 to 65%. The left ventricle has normal function. The left ventricle  has no regional wall motion abnormalities. Left ventricular diastolic parameters are consistent with Grade I diastolic dysfunction (impaired relaxation). Elevated left atrial pressure.  Right ventricular systolic function is normal. The right ventricular size is normal.  The mitral valve is normal in structure. Mild mitral valve regurgitation. No evidence of mitral stenosis. Moderate mitral annular calcification. The aortic valve has been repaired/replaced. Aortic valve regurgitation is not visualized. No aortic stenosis is present. There is a 26 mm Sapien prosthetic (TAVR) valve present in the aortic position. Procedure Date: 11/06/21. Echo findings are consistent with normal structure and function of the aortic valve prosthesis.  The inferior vena cava is normal in size with greater than 50% respiratory variability, suggesting right atrial pressure of 3 mmHg.   CT CORONARY MORPH W/CTA COR W/SCORE W/CA W/CM &/OR WO/CM performed on 09/28/2021 Calcified tri leaflet AV with score only 1263 Annular area 439.5 suitable for lower limit 26 Sapien May be better suited for a 29 [employer] Evolut Pro Coronary arteries suitable height above annulus for deployment Optimum angiographic angle for deployment LAO 14 Caudal 14 degrees Membranous septal length 7.4 mm  RIGHT/LEFT HEART CATHETERIZATION AND CORONARY ANGIOGRAPHY performed on 09/17/2021 Normal left ventricular systolic function LVEDP = 15 mmHg Multivessel CAD 30% proximal RCA 40% distal RCA  50% proximal LCx 30% distal LCx 50% proximal LAD  50% mid LAD 50% distal LAD Hemodynamics Mean RA = 3 mmHg Mean PA = 19 mmHg Mean PCWP = 11 mmHg LVEDP = 15 mmHg CO = 5.22 L/min CI = 2.75 L/min/m Recommendations: severe low-flow/low gradient aortic  stenosis by echocardiogram.  Continue workup for TAVR.  Medical management of nonobstructive CAD.   Impression and Plan:  Jon Mejia has been referred for pre-anesthesia review and clearance prior to him undergoing the planned anesthetic and procedural courses. Available labs, pertinent testing, and imaging results were personally reviewed by me in preparation for upcoming operative/procedural course. [facility] medical record has been updated following extensive record review and patient interview with PAT staff.   This patient has been appropriately cleared by cardiology with an overall ACCEPTABLE risk of experiencing significant perioperative cardiovascular complications. Based on clinical review performed today (07/22/22), barring any significant acute changes in the patient's overall condition, it is anticipated that he will be able to proceed with the planned surgical intervention. Any acute changes in clinical condition may necessitate his procedure being postponed and/or cancelled. Patient will meet with anesthesia team (MD and/or CRNA) on the day of his procedure for preoperative evaluation/assessment. Questions regarding anesthetic course will be fielded at that time.   Pre-surgical instructions were reviewed with the patient during his PAT appointment, and questions were fielded to satisfaction by PAT clinical staff. He has been instructed on which medications that he will need to hold prior to surgery, as well as the ones that have been deemed safe/appropriate to take on the day of his procedure. As part of the general education provided by PAT, patient made aware both verbally and in writing, that he would need to abstain from the use of any illegal substances during his perioperative course.  He was advised that failure to follow the provided instructions could necessitate case cancellation or result in serious perioperative complications up to and including death. Patient encouraged to  contact PAT and/or his surgeon's office to discuss any questions or concerns that may arise prior to surgery; verbalized understanding.   Quentin Mulling, MSN, APRN, FNP-C, CEN [facility]  Peri-operative Services Nurse Practitioner Phone: [phone]  [phone] Fax: [phone] 07/22/22 3:56 PM  NOTE: This note has been prepared using Scientist, clinical (histocompatibility and immunogenetics). Despite my best ability to proofread, there is always the potential that unintentional transcriptional errors may still occur from this process.

## 2022-07-19 NOTE — Telephone Encounter (Signed)
   Name: Jon Mejia  DOB: 1940/08/29  MRN: [id number]  Primary Cardiologist: Julien Nordmann, MD   Preoperative team, please contact this patient and set up a phone call appointment for further preoperative risk assessment. Please obtain consent and complete medication review. Thank you for your help.  I confirm that guidance regarding antiplatelet and oral anticoagulation therapy has been completed and, if necessary, noted below.  Pharmacy has addressed anticoagulation request   Ronney Asters, NP 07/19/2022, 2:44 PM [facility]

## 2022-07-22 ENCOUNTER — Encounter: Payer: Self-pay | Admitting: Surgery

## 2022-07-22 ENCOUNTER — Encounter
Admission: RE | Admit: 2022-07-22 | Discharge: 2022-07-22 | Disposition: A | Discharge status: Home / Self Care | Payer: PPO | Source: Ambulatory Visit | Attending: Surgery | Admitting: Surgery

## 2022-07-22 ENCOUNTER — Ambulatory Visit: Payer: PPO | Attending: Cardiology

## 2022-07-22 DIAGNOSIS — Z01810 Encounter for preprocedural cardiovascular examination: Secondary | ICD-10-CM

## 2022-07-22 DIAGNOSIS — K8010 Calculus of gallbladder with chronic cholecystitis without obstruction: Secondary | ICD-10-CM | POA: Diagnosis not present

## 2022-07-22 DIAGNOSIS — I10 Essential (primary) hypertension: Secondary | ICD-10-CM | POA: Insufficient documentation

## 2022-07-22 DIAGNOSIS — Z01818 Encounter for other preprocedural examination: Secondary | ICD-10-CM | POA: Insufficient documentation

## 2022-07-22 LAB — CBC WITH DIFFERENTIAL/PLATELET
Abs Immature Granulocytes: 0.03 10*3/uL (ref 0.00–0.07)
Basophils Absolute: 0 10*3/uL (ref 0.0–0.1)
Basophils Relative: 0 %
Eosinophils Absolute: 0.1 10*3/uL (ref 0.0–0.5)
Eosinophils Relative: 1 %
HCT: 38.8 % — ABNORMAL LOW (ref 39.0–52.0)
Hemoglobin: 13.2 g/dL (ref 13.0–17.0)
Immature Granulocytes: 0 %
Lymphocytes Relative: 25 %
Lymphs Abs: 2.2 10*3/uL (ref 0.7–4.0)
MCH: 29.5 pg (ref 26.0–34.0)
MCHC: 34 g/dL (ref 30.0–36.0)
MCV: 86.8 fL (ref 80.0–100.0)
Monocytes Absolute: 1.1 10*3/uL — ABNORMAL HIGH (ref 0.1–1.0)
Monocytes Relative: 13 %
Neutro Abs: 5.5 10*3/uL (ref 1.7–7.7)
Neutrophils Relative %: 61 %
Platelets: 283 10*3/uL (ref 150–400)
RBC: 4.47 MIL/uL (ref 4.22–5.81)
RDW: 13.3 % (ref 11.5–15.5)
WBC: 8.9 10*3/uL (ref 4.0–10.5)
nRBC: 0 % (ref 0.0–0.2)

## 2022-07-22 LAB — COMPREHENSIVE METABOLIC PANEL
ALT: 16 U/L (ref 0–44)
AST: 20 U/L (ref 15–41)
Albumin: 3.7 g/dL (ref 3.5–5.0)
Alkaline Phosphatase: 91 U/L (ref 38–126)
Anion gap: 9 (ref 5–15)
BUN: 13 mg/dL (ref 8–23)
CO2: 23 mmol/L (ref 22–32)
Calcium: 9.3 mg/dL (ref 8.9–10.3)
Chloride: 106 mmol/L (ref 98–111)
Creatinine, Ser: 0.95 mg/dL (ref 0.61–1.24)
GFR, Estimated: >60 mL/min (ref >60)
Glucose, Bld: 102 mg/dL — ABNORMAL HIGH (ref 70–99)
Potassium: 3.8 mmol/L (ref 3.5–5.1)
Sodium: 138 mmol/L (ref 135–145)
Total Bilirubin: 1.4 mg/dL — ABNORMAL HIGH (ref 0.3–1.2)
Total Protein: 7.5 g/dL (ref 6.5–8.1)

## 2022-07-22 NOTE — Progress Notes (Signed)
Virtual Visit via Telephone Note   Because of Jon Mejia's co-morbid illnesses, he is at least at moderate risk for complications without adequate follow up.  This format is felt to be most appropriate for this patient at this time.  The patient did not have access to video technology/had technical difficulties with video requiring transitioning to audio format only (telephone).  All issues noted in this document were discussed and addressed.  No physical exam could be performed with this format.  Please refer to the patient's chart for his consent to telehealth for [facility].  Evaluation Performed:  Preoperative cardiovascular risk assessment _____________   Date:  07/22/2022   Patient ID:  Jon Mejia, Jon Mejia [birth year], MRN [id number] Patient Location:  Home Provider location:   Office  Primary Care Provider:  Malva Limes, MD Primary Cardiologist:  Jon Nordmann, MD  Chief Complaint / Patient Profile   82 y.o. y/o male with a h/o hypertension, aortic atherosclerosis status post TAVR hyperlipidemia who is pending ROBOTIC ASSISTED LAPAROSCOPIC CHOLECYSTECTOMY and presents today for telephonic preoperative cardiovascular risk assessment.  History of Present Illness    Jon Mejia is a 82 y.o. male who presents via audio/video conferencing for a telehealth visit today.  Pt was last seen in cardiology clinic on 12/24/2021 by Dr. Mariah Mejia.  At that time Jon Mejia was doing well .  The patient is now pending procedure as outlined above. Since his last visit, he remains stable from a cardiac standpoint.  Today he denies chest pain, shortness of breath, lower extremity edema, fatigue, palpitations, melena, hematuria, hemoptysis, diaphoresis, weakness, presyncope, syncope, orthopnea, and PND.   Past Medical History    Past Medical History:  Diagnosis Date   Angina pectoris (HCC)    Aortic atherosclerosis (HCC)    Aortic stenosis    a.) TTE 03/14/2018:  mod AS (MPG 14); b.) TTE 09/13/2019: mild AS (MPG 12.5); c.) TTE 08/16/2021: mod AS (MPG 18.5); d.) R/LHC 09/17/2021: mRA 3, mPA 19, mPCWP 11, LVEDP 15, CO 5.73m CI 2.75; e.) s/p TAVR 11/06/2021   CAD (coronary artery disease) 09/17/2021   a.) R/LHC 09/17/2021: 30% pRCA, 40% dRCA, 50% pLCx, 30% dLCx, 50% pLAD, 50% mLAD, 50% dLAD --> med mgmt   Cardiac murmur    Cervical spondylosis    Cholelithiasis    CRAO (central retinal artery occlusion), right 03/14/2018   CVA (cerebral vascular accident) (HCC) 03/14/2018   a.) MRI brain 03/14/2018 --> acute infarct in the posterior RIGHT frontal lobe; small chronic cerebellar infarct --> Tx'd with TPA with (+) improvement/resolution of symptoms   Diastolic dysfunction    a.) TTE 01/31/2015: EF 55-60%, MAC, G1DD; b.) TTE 03/14/2018: EF 60-65%, mild LAE, sev MAC, sev AoV cal with mod AS (MPG 14) G2DD; c.) TTE 09/13/2019: EF 55-60%, mild LAE, mild-mod MR, mild AS (MPG 12.5), G2DD; d.) TTE 08/16/2021: EF 60-65%, mild LVH, mod MAC, mild MR, mod AS (MPG 18.5), G1DD; e.) TTE 12/05/2021: EF 60-65%, mod MAC, s/p TAVR (well seated/functioning valve), mild MR, G1DD   GERD (gastroesophageal reflux disease)    History of chicken pox    History of measles    Hyperlipidemia    Hypertension    LBBB (left bundle branch block)    Long term current use of anticoagulant    a.) apixaban   Low back pain radiating to left lower extremity    PAF (paroxysmal atrial fibrillation) (HCC)    a.) CHA2DS2VASc = 6 (age x2, HTN,  CVA x2, vascular disease history);  b.) rate/rhythm maintained on oral metoprolol succinate; chronically anticoagulated with apixaban   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   a.) 26mm S3UR via RIGHT TF approach   Skin cancer    Past Surgical History:  Procedure Laterality Date   INTRAOPERATIVE TRANSTHORACIC ECHOCARDIOGRAM N/A 11/06/2021   Procedure: INTRAOPERATIVE TRANSTHORACIC ECHOCARDIOGRAM;  Surgeon: Kathleene Hazel, MD;   Location: [facility] OR;  Service: Open Heart Surgery;  Laterality: N/A;   RIGHT/LEFT HEART CATH AND CORONARY ANGIOGRAPHY N/A 09/17/2021   Procedure: RIGHT/LEFT HEART CATH AND CORONARY ANGIOGRAPHY;  Surgeon: Kathleene Hazel, MD;  Location: [facility] INVASIVE CV LAB;  Service: Cardiovascular;  Laterality: N/A;   SKIN CANCER EXCISION  07/2020   TRANSCATHETER AORTIC VALVE REPLACEMENT, TRANSFEMORAL N/A 11/06/2021   Procedure: Transcatheter Aortic Valve Replacement, Transfemoral;  Surgeon: Kathleene Hazel, MD;  Location: [facility] OR;  Service: Open Heart Surgery;  Laterality: N/A;    Allergies  No Known Allergies  Home Medications    Prior to Admission medications   Medication Sig Start Date End Date Taking? Authorizing Provider  amLODipine (NORVASC) 5 MG tablet Take 1 tablet (5 mg total) by mouth daily. 12/24/21   Antonieta Iba, MD  apixaban (ELIQUIS) 5 MG TABS tablet TAKE ONE (1) TABLET BY MOUTH TWO TIMES PER DAY 05/17/22   Antonieta Iba, MD  atorvastatin (LIPITOR) 40 MG tablet TAKE 1 TABLET BY MOUTH DAILY 11/27/21   Jon Limes, MD  Cyanocobalamin (B-12) 3000 MCG CAPS Take 3,000 mcg by mouth daily as needed (energy).    [provider]  metoprolol succinate (TOPROL-XL) 25 MG 24 hr tablet Take 1.5 tablets (37.5 mg total) by mouth daily. 04/16/22   Antonieta Iba, MD  omeprazole (PRILOSEC) 40 MG capsule TAKE 1 CAPSULE BY MOUTH DAILY. 06/25/22   Campbell Lerner, MD  ondansetron (ZOFRAN) 4 MG tablet Take 1 tablet (4 mg total) by mouth every 8 (eight) hours as needed for nausea or vomiting. 07/11/22   Campbell Lerner, MD    Physical Exam    Vital Signs:  Jon Mejia does not have vital signs available for review today.  Given telephonic nature of communication, physical exam is limited. AAOx3. NAD. Normal affect.  Speech and respirations are unlabored.  Accessory Clinical Findings    None  Assessment & Plan    1.  Preoperative Cardiovascular Risk  Assessment:ROBOTIC ASSISTED LAPAROSCOPIC CHOLECYSTECTOMY , 07/23/2021 , Dr. Campbell Lerner, MD , Fax: [phone]       Primary Cardiologist: Jon Nordmann, MD  Chart reviewed as part of pre-operative protocol coverage. Given past medical history and time since last visit, based on ACC/AHA guidelines, Jon Mejia would be at acceptable risk for the planned procedure without further cardiovascular testing.   His RCRI is a class III risk, 6.6% risk of major cardiac event.  He is able to complete greater than 4 METS of physical activity.  Per office protocol, patient can hold Eliquis for 2 days prior to procedure. Resume as soon as safely possible after due to elevated CV risk.   Patient was advised that if he develops new symptoms prior to surgery to contact our office to arrange a follow-up appointment.  He verbalized understanding.  I will route this recommendation to the requesting party via Epic fax function and remove from pre-op pool.       Time:   Today, I have spent 5 minutes with the patient with telehealth technology discussing medical history,  symptoms, and management plan.  Prior to his phone evaluation I spent greater than 10 minutes reviewing his past medical history and cardiac medications.   Ronney Asters, NP  07/22/2022, 8:38 AM

## 2022-07-24 ENCOUNTER — Other Ambulatory Visit: Payer: Self-pay

## 2022-07-24 ENCOUNTER — Encounter: Payer: Self-pay | Admitting: Surgery

## 2022-07-24 ENCOUNTER — Ambulatory Visit: Payer: PPO | Admitting: Urgent Care

## 2022-07-24 ENCOUNTER — Ambulatory Visit
Admission: RE | Admit: 2022-07-24 | Discharge: 2022-07-24 | Disposition: A | Discharge status: Home / Self Care | Payer: PPO | Attending: Surgery | Admitting: Surgery

## 2022-07-24 ENCOUNTER — Encounter
Admission: RE | Disposition: A | Discharge status: Home / Self Care | Payer: Self-pay | Source: Home / Self Care | Attending: Surgery

## 2022-07-24 DIAGNOSIS — K8010 Calculus of gallbladder with chronic cholecystitis without obstruction: Secondary | ICD-10-CM | POA: Diagnosis not present

## 2022-07-24 DIAGNOSIS — I4891 Unspecified atrial fibrillation: Secondary | ICD-10-CM

## 2022-07-24 DIAGNOSIS — E785 Hyperlipidemia, unspecified: Secondary | ICD-10-CM | POA: Insufficient documentation

## 2022-07-24 DIAGNOSIS — K8012 Calculus of gallbladder with acute and chronic cholecystitis without obstruction: Secondary | ICD-10-CM

## 2022-07-24 DIAGNOSIS — K915 Postcholecystectomy syndrome: Secondary | ICD-10-CM | POA: Diagnosis not present

## 2022-07-24 DIAGNOSIS — Z87891 Personal history of nicotine dependence: Secondary | ICD-10-CM | POA: Diagnosis not present

## 2022-07-24 DIAGNOSIS — K219 Gastro-esophageal reflux disease without esophagitis: Secondary | ICD-10-CM | POA: Diagnosis not present

## 2022-07-24 DIAGNOSIS — Z79899 Other long term (current) drug therapy: Secondary | ICD-10-CM | POA: Insufficient documentation

## 2022-07-24 DIAGNOSIS — Z85828 Personal history of other malignant neoplasm of skin: Secondary | ICD-10-CM | POA: Diagnosis not present

## 2022-07-24 DIAGNOSIS — I2510 Atherosclerotic heart disease of native coronary artery without angina pectoris: Secondary | ICD-10-CM | POA: Diagnosis not present

## 2022-07-24 DIAGNOSIS — K8000 Calculus of gallbladder with acute cholecystitis without obstruction: Secondary | ICD-10-CM

## 2022-07-24 DIAGNOSIS — I480 Paroxysmal atrial fibrillation: Secondary | ICD-10-CM | POA: Diagnosis not present

## 2022-07-24 DIAGNOSIS — Z952 Presence of prosthetic heart valve: Secondary | ICD-10-CM | POA: Diagnosis not present

## 2022-07-24 DIAGNOSIS — I10 Essential (primary) hypertension: Secondary | ICD-10-CM | POA: Diagnosis not present

## 2022-07-24 DIAGNOSIS — Z7901 Long term (current) use of anticoagulants: Secondary | ICD-10-CM | POA: Insufficient documentation

## 2022-07-24 DIAGNOSIS — I350 Nonrheumatic aortic (valve) stenosis: Secondary | ICD-10-CM | POA: Insufficient documentation

## 2022-07-24 HISTORY — DX: Cardiac murmur, unspecified: R01.1

## 2022-07-24 HISTORY — DX: Left bundle-branch block, unspecified: I44.7

## 2022-07-24 HISTORY — DX: Pain in left leg: M79.605

## 2022-07-24 HISTORY — DX: Spondylosis without myelopathy or radiculopathy, cervical region: M47.812

## 2022-07-24 HISTORY — DX: Unspecified malignant neoplasm of skin, unspecified: C44.90

## 2022-07-24 HISTORY — DX: Atherosclerosis of aorta: I70.0

## 2022-07-24 HISTORY — DX: Low back pain, unspecified: M54.50

## 2022-07-24 HISTORY — DX: Angina pectoris, unspecified: I20.9

## 2022-07-24 HISTORY — PX: ROBOTIC ASSISTED LAPAROSCOPIC CHOLECYSTECTOMY: SHX6521

## 2022-07-24 HISTORY — DX: Long term (current) use of anticoagulants: Z79.01

## 2022-07-24 HISTORY — DX: Other ill-defined heart diseases: I51.89

## 2022-07-24 HISTORY — DX: Calculus of gallbladder without cholecystitis without obstruction: K80.20

## 2022-07-24 HISTORY — DX: Gastro-esophageal reflux disease without esophagitis: K21.9

## 2022-07-24 SURGERY — CHOLECYSTECTOMY, ROBOT-ASSISTED, LAPAROSCOPIC
Anesthesia: General

## 2022-07-24 MED ORDER — FENTANYL CITRATE (PF) 100 MCG/2ML IJ SOLN
INTRAMUSCULAR | Status: AC
  Filled 2022-07-24: qty 2

## 2022-07-24 MED ORDER — CELECOXIB 200 MG PO CAPS
ORAL_CAPSULE | ORAL | Status: AC
  Filled 2022-07-24: qty 1

## 2022-07-24 MED ORDER — CHLORHEXIDINE GLUCONATE 0.12 % MT SOLN
OROMUCOSAL | Status: AC
  Filled 2022-07-24: qty 15

## 2022-07-24 MED ORDER — PROPOFOL 10 MG/ML IV BOLUS
INTRAVENOUS | Status: DC | PRN
  Administered 2022-07-24: 50 mg via INTRAVENOUS
  Administered 2022-07-24: 100 mg via INTRAVENOUS

## 2022-07-24 MED ORDER — BUPIVACAINE HCL (PF) 0.25 % IJ SOLN
INTRAMUSCULAR | Status: AC
  Filled 2022-07-24: qty 30

## 2022-07-24 MED ORDER — CHLORHEXIDINE GLUCONATE CLOTH 2 % EX PADS
6.0000 | MEDICATED_PAD | Freq: Once | CUTANEOUS | Status: AC
  Administered 2022-07-24: 6 via TOPICAL

## 2022-07-24 MED ORDER — LIDOCAINE HCL (CARDIAC) PF 100 MG/5ML IV SOSY
PREFILLED_SYRINGE | INTRAVENOUS | Status: DC | PRN
  Administered 2022-07-24: 50 mg via INTRAVENOUS

## 2022-07-24 MED ORDER — EPINEPHRINE PF 1 MG/ML IJ SOLN
INTRAMUSCULAR | Status: AC
  Filled 2022-07-24: qty 1

## 2022-07-24 MED ORDER — CEFAZOLIN SODIUM-DEXTROSE 2-4 GM/100ML-% IV SOLN
INTRAVENOUS | Status: AC
  Filled 2022-07-24: qty 100

## 2022-07-24 MED ORDER — ACETAMINOPHEN 500 MG PO TABS
1000.0000 mg | ORAL_TABLET | ORAL | Status: AC
  Administered 2022-07-24: 1000 mg via ORAL

## 2022-07-24 MED ORDER — INDOCYANINE GREEN 25 MG IV SOLR
1.2500 mg | Freq: Once | INTRAVENOUS | Status: AC
  Administered 2022-07-24: 1.25 mg via INTRAVENOUS

## 2022-07-24 MED ORDER — 0.9 % SODIUM CHLORIDE (POUR BTL) OPTIME
TOPICAL | Status: DC | PRN
  Administered 2022-07-24: 500 mL

## 2022-07-24 MED ORDER — BUPIVACAINE-EPINEPHRINE (PF) 0.25% -1:200000 IJ SOLN
INTRAMUSCULAR | Status: DC | PRN
  Administered 2022-07-24: 26 mL via PERINEURAL

## 2022-07-24 MED ORDER — ACETAMINOPHEN 500 MG PO TABS
ORAL_TABLET | ORAL | Status: AC
  Filled 2022-07-24: qty 2

## 2022-07-24 MED ORDER — SODIUM CHLORIDE 0.9 % IR SOLN
Status: DC | PRN
  Administered 2022-07-24: 3000 mL

## 2022-07-24 MED ORDER — ONDANSETRON HCL 4 MG/2ML IJ SOLN: 4.0000 mg | Freq: Once | INTRAMUSCULAR | Status: DC | PRN

## 2022-07-24 MED ORDER — LACTATED RINGERS IV SOLN: INTRAVENOUS | Status: DC

## 2022-07-24 MED ORDER — IBUPROFEN 800 MG PO TABS
800.0000 mg | ORAL_TABLET | Freq: Three times a day (TID) | ORAL | 0 refills | Status: DC | PRN
Start: 2022-07-24 — End: 2023-09-25

## 2022-07-24 MED ORDER — BUPIVACAINE LIPOSOME 1.3 % IJ SUSP: 20.0000 mL | Freq: Once | INTRAMUSCULAR | Status: DC

## 2022-07-24 MED ORDER — ROCURONIUM BROMIDE 100 MG/10ML IV SOLN
INTRAVENOUS | Status: DC | PRN
  Administered 2022-07-24: 50 mg via INTRAVENOUS
  Administered 2022-07-24: 5 mg via INTRAVENOUS
  Administered 2022-07-24: 10 mg via INTRAVENOUS

## 2022-07-24 MED ORDER — DEXAMETHASONE SODIUM PHOSPHATE 10 MG/ML IJ SOLN
INTRAMUSCULAR | Status: DC | PRN
  Administered 2022-07-24: 6 mg via INTRAVENOUS

## 2022-07-24 MED ORDER — BUPIVACAINE LIPOSOME 1.3 % IJ SUSP
INTRAMUSCULAR | Status: AC
  Filled 2022-07-24: qty 20

## 2022-07-24 MED ORDER — ALBUTEROL SULFATE HFA 108 (90 BASE) MCG/ACT IN AERS
INHALATION_SPRAY | RESPIRATORY_TRACT | Status: DC | PRN
  Administered 2022-07-24: 4 via RESPIRATORY_TRACT
  Administered 2022-07-24: 2 via RESPIRATORY_TRACT

## 2022-07-24 MED ORDER — DEXMEDETOMIDINE HCL IN NACL 80 MCG/20ML IV SOLN
INTRAVENOUS | Status: DC | PRN
  Administered 2022-07-24 (×2): 12 ug via INTRAVENOUS

## 2022-07-24 MED ORDER — DEXMEDETOMIDINE HCL IN NACL 80 MCG/20ML IV SOLN
INTRAVENOUS | Status: AC
  Filled 2022-07-24: qty 20

## 2022-07-24 MED ORDER — GABAPENTIN 300 MG PO CAPS
ORAL_CAPSULE | ORAL | Status: AC
  Filled 2022-07-24: qty 1

## 2022-07-24 MED ORDER — ALBUTEROL SULFATE HFA 108 (90 BASE) MCG/ACT IN AERS
INHALATION_SPRAY | RESPIRATORY_TRACT | Status: AC
  Filled 2022-07-24: qty 6.7

## 2022-07-24 MED ORDER — LIDOCAINE HCL (PF) 2 % IJ SOLN
INTRAMUSCULAR | Status: AC
  Filled 2022-07-24: qty 5

## 2022-07-24 MED ORDER — GABAPENTIN 300 MG PO CAPS
300.0000 mg | ORAL_CAPSULE | ORAL | Status: AC
  Administered 2022-07-24: 300 mg via ORAL

## 2022-07-24 MED ORDER — ORAL CARE MOUTH RINSE: 15.0000 mL | Freq: Once | OROMUCOSAL | Status: AC

## 2022-07-24 MED ORDER — INDOCYANINE GREEN 25 MG IV SOLR
INTRAVENOUS | Status: AC
  Filled 2022-07-24: qty 10

## 2022-07-24 MED ORDER — PHENYLEPHRINE HCL (PRESSORS) 10 MG/ML IV SOLN
INTRAVENOUS | Status: DC | PRN
  Administered 2022-07-24: 80 ug via INTRAVENOUS
  Administered 2022-07-24: 40 ug via INTRAVENOUS
  Administered 2022-07-24: 160 ug via INTRAVENOUS
  Administered 2022-07-24 (×3): 80 ug via INTRAVENOUS

## 2022-07-24 MED ORDER — METOPROLOL TARTRATE 5 MG/5ML IV SOLN
INTRAVENOUS | Status: DC | PRN
  Administered 2022-07-24: 2 mg via INTRAVENOUS

## 2022-07-24 MED ORDER — CELECOXIB 200 MG PO CAPS
200.0000 mg | ORAL_CAPSULE | ORAL | Status: AC
  Administered 2022-07-24: 200 mg via ORAL

## 2022-07-24 MED ORDER — FENTANYL CITRATE (PF) 100 MCG/2ML IJ SOLN
INTRAMUSCULAR | Status: DC | PRN
  Administered 2022-07-24 (×2): 50 ug via INTRAVENOUS

## 2022-07-24 MED ORDER — DEXAMETHASONE SODIUM PHOSPHATE 10 MG/ML IJ SOLN
INTRAMUSCULAR | Status: AC
  Filled 2022-07-24: qty 1

## 2022-07-24 MED ORDER — METOPROLOL TARTRATE 5 MG/5ML IV SOLN
INTRAVENOUS | Status: AC
  Filled 2022-07-24: qty 5

## 2022-07-24 MED ORDER — HYDROCODONE-ACETAMINOPHEN 5-325 MG PO TABS
1.0000 | ORAL_TABLET | Freq: Four times a day (QID) | ORAL | 0 refills | Status: DC | PRN
Start: 2022-07-24 — End: 2022-07-30

## 2022-07-24 MED ORDER — CHLORHEXIDINE GLUCONATE 0.12 % MT SOLN
15.0000 mL | Freq: Once | OROMUCOSAL | Status: AC
  Administered 2022-07-24: 15 mL via OROMUCOSAL

## 2022-07-24 MED ORDER — ONDANSETRON HCL 4 MG/2ML IJ SOLN
INTRAMUSCULAR | Status: AC
  Filled 2022-07-24: qty 2

## 2022-07-24 MED ORDER — SUGAMMADEX SODIUM 500 MG/5ML IV SOLN
INTRAVENOUS | Status: DC | PRN
  Administered 2022-07-24: 200 mg via INTRAVENOUS

## 2022-07-24 MED ORDER — APIXABAN 5 MG PO TABS
ORAL_TABLET | ORAL | 1 refills | Status: DC
Start: 2022-07-24 — End: 2023-09-29

## 2022-07-24 MED ORDER — PHENYLEPHRINE 80 MCG/ML (10ML) SYRINGE FOR IV PUSH (FOR BLOOD PRESSURE SUPPORT)
PREFILLED_SYRINGE | INTRAVENOUS | Status: AC
  Filled 2022-07-24: qty 10

## 2022-07-24 MED ORDER — FENTANYL CITRATE (PF) 100 MCG/2ML IJ SOLN
25.0000 ug | INTRAMUSCULAR | Status: DC | PRN
  Administered 2022-07-24 (×2): 25 ug via INTRAVENOUS

## 2022-07-24 MED ORDER — ROCURONIUM BROMIDE 10 MG/ML (PF) SYRINGE
PREFILLED_SYRINGE | INTRAVENOUS | Status: AC
  Filled 2022-07-24: qty 10

## 2022-07-24 MED ORDER — CEFAZOLIN SODIUM-DEXTROSE 2-4 GM/100ML-% IV SOLN
2.0000 g | INTRAVENOUS | Status: AC
  Administered 2022-07-24: 2 g via INTRAVENOUS

## 2022-07-24 MED ORDER — PROPOFOL 10 MG/ML IV BOLUS
INTRAVENOUS | Status: AC
  Filled 2022-07-24: qty 20

## 2022-07-24 MED ORDER — ONDANSETRON HCL 4 MG/2ML IJ SOLN
INTRAMUSCULAR | Status: DC | PRN
  Administered 2022-07-24: 4 mg via INTRAVENOUS

## 2022-07-24 SURGICAL SUPPLY — 48 items
ADH SKN CLS APL DERMABOND .7 (GAUZE/BANDAGES/DRESSINGS) ×1
BAG PRESSURE INF REUSE 3000 (BAG) IMPLANT
BULB RESERV EVAC DRAIN JP 100C (MISCELLANEOUS) IMPLANT
CLIP LIGATING HEM O LOK PURPLE (MISCELLANEOUS) ×1 IMPLANT
COVER TIP SHEARS 8 DVNC (MISCELLANEOUS) ×1 IMPLANT
DERMABOND ADVANCED .7 DNX12 (GAUZE/BANDAGES/DRESSINGS) ×1 IMPLANT
DRAIN CHANNEL 19F RND (DRAIN) IMPLANT
DRAPE ARM DVNC X/XI (DISPOSABLE) ×4 IMPLANT
DRAPE COLUMN DVNC XI (DISPOSABLE) ×1 IMPLANT
DRSG TEGADERM 4X4.75 (GAUZE/BANDAGES/DRESSINGS) IMPLANT
ELECT CAUTERY BLADE 6.4 (BLADE) ×1 IMPLANT
FORCEPS BPLR R/ABLATION 8 DVNC (INSTRUMENTS) ×1 IMPLANT
FORCEPS PROGRASP DVNC XI (FORCEP) ×1 IMPLANT
GLOVE ORTHO TXT STRL SZ7.5 (GLOVE) ×2 IMPLANT
GOWN STRL REUS W/ TWL LRG LVL3 (GOWN DISPOSABLE) ×2 IMPLANT
GOWN STRL REUS W/ TWL XL LVL3 (GOWN DISPOSABLE) ×2 IMPLANT
GOWN STRL REUS W/TWL LRG LVL3 (GOWN DISPOSABLE) ×2
GOWN STRL REUS W/TWL XL LVL3 (GOWN DISPOSABLE) ×2
GRASPER SUT TROCAR 14GX15 (MISCELLANEOUS) ×1 IMPLANT
IRRIGATOR SUCT 8 DISP DVNC XI (IRRIGATION / IRRIGATOR) IMPLANT
IV NS IRRIG 3000ML ARTHROMATIC (IV SOLUTION) IMPLANT
KIT PINK PAD W/HEAD ARE REST (MISCELLANEOUS) ×1
KIT PINK PAD W/HEAD ARM REST (MISCELLANEOUS) ×1 IMPLANT
KIT TURNOVER KIT A (KITS) ×1 IMPLANT
LABEL OR SOLS (LABEL) ×1 IMPLANT
MANIFOLD NEPTUNE II (INSTRUMENTS) ×1 IMPLANT
NDL HYPO 22X1.5 SAFETY MO (MISCELLANEOUS) ×1 IMPLANT
NDL INSUFFLATION 14GA 120MM (NEEDLE) ×1 IMPLANT
NEEDLE HYPO 22X1.5 SAFETY MO (MISCELLANEOUS) ×1 IMPLANT
NEEDLE INSUFFLATION 14GA 120MM (NEEDLE) ×1 IMPLANT
NS IRRIG 500ML POUR BTL (IV SOLUTION) ×1 IMPLANT
PACK LAP CHOLECYSTECTOMY (MISCELLANEOUS) ×1 IMPLANT
SCISSORS MNPLR CVD DVNC XI (INSTRUMENTS) ×1 IMPLANT
SEAL UNIV 5-12 XI (MISCELLANEOUS) ×4 IMPLANT
SET TUBE SMOKE EVAC HIGH FLOW (TUBING) ×1 IMPLANT
SOL ELECTROSURG ANTI STICK (MISCELLANEOUS) ×1
SOLUTION ELECTROSURG ANTI STCK (MISCELLANEOUS) ×1 IMPLANT
SPIKE FLUID TRANSFER (MISCELLANEOUS) ×1 IMPLANT
SPONGE DRAIN TRACH 4X4 STRL 2S (GAUZE/BANDAGES/DRESSINGS) IMPLANT
SUT MNCRL 4-0 (SUTURE) ×2
SUT MNCRL 4-0 27XMFL (SUTURE) ×2
SUT VICRYL 0 UR6 27IN ABS (SUTURE) ×1 IMPLANT
SUTURE MNCRL 4-0 27XMF (SUTURE) ×1 IMPLANT
SYS BAG RETRIEVAL 10MM (BASKET) ×1
SYSTEM BAG RETRIEVAL 10MM (BASKET) ×1 IMPLANT
TRAP FLUID SMOKE EVACUATOR (MISCELLANEOUS) ×1 IMPLANT
TROCAR Z-THREAD FIOS 11X100 BL (TROCAR) ×1 IMPLANT
WATER STERILE IRR 500ML POUR (IV SOLUTION) ×1 IMPLANT

## 2022-07-24 NOTE — Interval H&P Note (Signed)
History and Physical Interval Note:  07/24/2022 11:40 AM  Jon Mejia  has presented today for surgery, with the diagnosis of Gallstones.  The various methods of treatment have been discussed with the patient and family. After consideration of risks, benefits and other options for treatment, the patient has consented to  Procedure(s): XI ROBOTIC ASSISTED LAPAROSCOPIC CHOLECYSTECTOMY (N/A) INDOCYANINE GREEN FLUORESCENCE IMAGING (ICG) (N/A) as a surgical intervention.  The patient's history has been reviewed, patient examined, no change in status, stable for surgery.  I have reviewed the patient's chart and labs.  Questions were answered to the patient's satisfaction.     Campbell Lerner

## 2022-07-24 NOTE — Anesthesia Preprocedure Evaluation (Signed)
Anesthesia Evaluation  Patient identified by MRN, date of birth, ID band Patient awake    Reviewed: Allergy & Precautions, H&P , NPO status , Patient's Chart, lab work & pertinent test results, reviewed documented beta blocker date and time   History of Anesthesia Complications Negative for: history of anesthetic complications  Airway Mallampati: II  TM Distance: >3 FB Neck ROM: full    Dental  (+) Edentulous Upper, Dental Advidsory Given, Poor Dentition, Missing   Pulmonary neg pulmonary ROS, former smoker   Pulmonary exam normal breath sounds clear to auscultation       Cardiovascular Exercise Tolerance: Good hypertension, (-) angina + CAD  (-) Past MI, (-) Cardiac Stents and (-) CABG + dysrhythmias Atrial Fibrillation + Valvular Problems/Murmurs (s/p TAVR last year) AS  Rhythm:regular Rate:Normal     Neuro/Psych neg Seizures CVA (treated with TPA and resolved), No Residual Symptoms  negative psych ROS   GI/Hepatic Neg liver ROS,GERD  ,,  Endo/Other  negative endocrine ROS    Renal/GU negative Renal ROS  negative genitourinary   Musculoskeletal   Abdominal   Peds  Hematology negative hematology ROS (+)   Anesthesia Other Findings Past Medical History: No date: Angina pectoris (HCC) No date: Aortic atherosclerosis (HCC) No date: Aortic stenosis     Comment:  a.) TTE 03/14/2018: mod AS (MPG 14); b.) TTE 09/13/2019:              mild AS (MPG 12.5); c.) TTE 08/16/2021: mod AS (MPG               18.5); d.) R/LHC 09/17/2021: mRA 3, mPA 19, mPCWP 11,               LVEDP 15, CO 5.22, CI 2.75; e.) s/p TAVR 11/06/2021 09/17/2021: CAD (coronary artery disease)     Comment:  a.) R/LHC 09/17/2021: 30% pRCA, 40% dRCA, 50% pLCx, 30%               dLCx, 50% pLAD, 50% mLAD, 50% dLAD --> med mgmt No date: Cardiac murmur No date: Cervical spondylosis No date: Cholelithiasis 03/13/2018: CRAO (central retinal artery occlusion),  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: CVA (cerebral vascular accident) [facility])     Comment:  a.) MRI brain 03/14/2018 --> acute infarct in the               posterior RIGHT frontal lobe; small chronic cerebellar               infarct --> Tx'd with TPA with (+) improvement/resolution              of symptoms No date: Diastolic dysfunction     Comment:  a.) TTE 01/31/2015: EF 55-60%, MAC, G1DD; b.) TTE               03/14/2018: EF 60-65%, mild LAE, sev MAC, sev AoV cal               with mod AS (MPG 14) G2DD; c.) TTE 09/13/2019: EF 55-60%,              mild LAE, mild-mod MR, mild AS (MPG 12.5), G2DD; d.) TTE               08/16/2021: EF 60-65%, mild LVH, mod MAC, mild MR, mod AS              (MPG 18.5), G1DD; e.) TTE 12/05/2021: EF 60-65%, mod MAC,  s/p TAVR (well seated/functioning valve), mild MR, G1DD No date: GERD (gastroesophageal reflux disease) No date: History of chicken pox No date: History of measles No date: Hyperlipidemia No date: Hypertension No date: LBBB (left bundle branch block) No date: Long term current use of anticoagulant     Comment:  a.) apixaban No date: Low back pain radiating to left lower extremity No date: PAF (paroxysmal atrial fibrillation) (HCC)     Comment:  a.) CHA2DS2VASc = 6 (age x2, HTN, CVA x2, vascular               disease history);  b.) rate/rhythm maintained on oral               metoprolol succinate; chronically anticoagulated with               apixaban 11/06/2021: S/P TAVR (transcatheter aortic valve replacement)     Comment:  a.) 26mm S3UR via RIGHT TF approach No date: Skin cancer   Reproductive/Obstetrics negative OB ROS                             Anesthesia Physical Anesthesia Plan  ASA: 3  Anesthesia Plan: General   Post-op Pain Management:    Induction: Intravenous  PONV Risk Score and Plan: 2 and Ondansetron, Dexamethasone and Treatment may vary due to age or medical condition  Airway Management Planned:  Oral ETT  Additional Equipment:   Intra-op Plan:   Post-operative Plan: Extubation in OR  Informed Consent: I have reviewed the patients History and Physical, chart, labs and discussed the procedure including the risks, benefits and alternatives for the proposed anesthesia with the patient or authorized representative who has indicated his/her understanding and acceptance.     Dental Advisory Given  Plan Discussed with: Anesthesiologist, CRNA and Surgeon  Anesthesia Plan Comments:         Anesthesia Quick Evaluation

## 2022-07-24 NOTE — Anesthesia Procedure Notes (Signed)
Procedure Name: Intubation Date/Time: 07/24/2022 12:15 PM  Performed by: Jeannene Patella, CRNAPre-anesthesia Checklist: Patient identified, Emergency Drugs available, Suction available, Patient being monitored and Timeout performed Patient Re-evaluated:Patient Re-evaluated prior to induction Oxygen Delivery Method: Circle system utilized Preoxygenation: Pre-oxygenation with 100% oxygen Induction Type: IV induction Ventilation: Mask ventilation without difficulty and Oral airway inserted - appropriate to patient size Laryngoscope Size: McGraph and 4 Grade View: Grade II Tube type: Oral Tube size: 7.5 mm Number of attempts: 1 Airway Equipment and Method: Stylet, Video-laryngoscopy and LTA kit utilized Placement Confirmation: ETT inserted through vocal cords under direct vision, positive ETCO2 and breath sounds checked- equal and bilateral Secured at: 21 (at lip) cm Tube secured with: Tape Dental Injury: Teeth and Oropharynx as per pre-operative assessment

## 2022-07-24 NOTE — Op Note (Signed)
Robotic cholecystectomy with Indocyamine Green Ductal Imaging.   Pre-operative Diagnosis: Chronic calculus cholecystitis  Post-operative Diagnosis:  Acute on chronic calculus cholecystitis.  Procedure: Robotic assisted laparoscopic cholecystectomy with Indocyamine Green Ductal Imaging.   Surgeon: Campbell Lerner, M.D., FACS  Anesthesia: General. with endotracheal tube  Findings: Hydrops, intra-hepatic, with distended infundibulum  Estimated Blood Loss: 150 mL         Drains: None         Specimens: Gallbladder           Complications: none  Procedure Details  The patient was seen again in the Holding Room.  1.25 mg dose of ICG was administered intravenously.   The benefits, complications, treatment options, risks and expected outcomes were again reviewed with the patient. The likelihood of improving the patient's symptoms with return to their baseline status is good.  The patient and/or family concurred with the proposed plan, giving informed consent, again alternatives reviewed.  The patient was taken to Operating Room, identified, and the procedure verified as robotic assisted laparoscopic cholecystectomy.  Prior to the induction of general anesthesia, antibiotic prophylaxis was administered. VTE prophylaxis was in place. General endotracheal anesthesia was then administered and tolerated well. The patient was positioned in the supine position.  After the induction, the abdomen was prepped with Chloraprep and draped in the sterile fashion.  A Time Out was held and the above information confirmed.  After local infiltration of quarter percent Marcaine with epinephrine, stab incision was made left upper quadrant.  Just below the costal margin at Palmer's point, approximately midclavicular line the Veres needle is passed with sensation of the layers to penetrate the abdominal wall and into the peritoneum.  Saline drop test is confirmed peritoneal placement.  Insufflation is initiated with  carbon dioxide to pressures of 15 mmHg.  Right para-umbilical local infiltration with quarter percent Marcaine with epinephrine is utilized.  Made a 12 mm incision on the right periumbilical site, I advanced an optical 11mm port under direct visualization into the peritoneal cavity.  Once the peritoneum was penetrated, insufflation was initiated.  The trocar was then advanced into the abdominal cavity under direct visualization. Pneumoperitoneum was then continued utilizing CO2 at 15 mmHg or less and tolerated well without any adverse changes in the patient's vital signs.  Two 8.5-mm ports were placed in the left lower quadrant and laterally, and one to the right lower quadrant, all under direct vision. All skin incisions  were infiltrated with a local anesthetic agent before making the incision and placing the trocars.  The patient was positioned  in reverse Trendelenburg, tilted the patient's left side down.  Da Vinci XI robot was then positioned on to the patient's left side, and docked.  The gallbladder was identified, decompressed with scissors, and the robotic suction irrigator.  Then, the fundus is grasped via the arm 4 Prograsp and retracted cephalad. Adhesions were lysed with scissors and cautery. Use of the suction irrigator was critical in blunt dissection of the extensive scarring of the infundibulum and intrahepatic disposition.  The infundibulum was dissected from adhesions, identified grasped and retracted, exposing the triangle of Calot. This was then dissected using cautery & scissors, and suction irrigator. An extended critical view of the cystic duct and cystic artery was obtained, aided by the ICG via FireFly which improved localization of the ductal anatomy.    The cystic duct was clearly identified, of large caliber, and dissected to isolation.   Artery well isolated and clipped, and the cystic duct  was triple clipped and divided with scissors, as close to the gallbladder neck as  feasible, thus leaving two on the remaining stump.  The specimen side of the artery is sealed with bipolar and divided with monopolar scissors.   The gallbladder was taken from the gallbladder fossa in a retrograde fashion with the electrocautery, with some difficulty as the planes were obscured and the liver friable. The gallbladder was removed and placed in an Endocatch bag.  The liver bed is inspected. Hemostasis was confirmed.  The robot was undocked and moved away from the operative field. NSS irrigation was utilized and was aspirated clear.  The gallbladder and Endocatch sac were then removed through the umbilical port site.  A 19 Blake drain is placed in the GB fossa.   Inspection of the right upper quadrant was performed. No bleeding, bile duct injury or leak, or bowel injury was noted. The umbilical port site fascia was closed with running 0 Vicryl sutures under direct visualization. Pneumoperitoneum was released and ports removed.  4-0 subcuticular Monocryl was used to close the skin. Dermabond was  applied.  The patient was then extubated and brought to the recovery room in stable condition. Sponge, lap, and needle counts were correct at closure and at the conclusion of the case.               Campbell Lerner, M.D., [facility] : [facility] 07/24/2022 3:05 PM

## 2022-07-24 NOTE — Discharge Instructions (Addendum)
   (   9 am )     ( 3 pm )        ( 9 pm )                Date L  R  L  R  L  R                                                                                                               AMBULATORY SURGERY  DISCHARGE INSTRUCTIONS   The drugs that you were given will stay in your system until tomorrow so for the next 24 hours you should not:  Drive an automobile Make any legal decisions Drink any alcoholic beverage   You may resume regular meals tomorrow.  Today it is better to start with liquids and gradually work up to solid foods.  You may eat anything you prefer, but it is better to start with liquids, then soup and crackers, and gradually work up to solid foods.   Please notify your doctor immediately if you have any unusual bleeding, trouble breathing, redness and pain at the surgery site, drainage, fever, or pain not relieved by medication.    Additional Instructions:        Please contact your physician with any problems or Same Day Surgery at [phone], Monday through Friday 6 am to 4 pm, or [place] at [facility] number at [phone].

## 2022-07-24 NOTE — Transfer of Care (Signed)
Immediate Anesthesia Transfer of Care Note  Patient: Jon Mejia  Procedure(s) Performed: XI ROBOTIC ASSISTED LAPAROSCOPIC CHOLECYSTECTOMY INDOCYANINE GREEN FLUORESCENCE IMAGING (ICG)  Patient Location: PACU  Anesthesia Type:General  Level of Consciousness: drowsy and patient cooperative  Airway & Oxygen Therapy: Patient Spontanous Breathing and Patient connected to face mask oxygen  Post-op Assessment: Report given to RN and Post -op Vital signs reviewed and stable  Post vital signs: Reviewed and stable  Last Vitals:  Vitals Value Taken Time  BP 114/64 07/24/22 1454  Temp    Pulse 65 07/24/22 1457  Resp 30 07/24/22 1457  SpO2 98 % 07/24/22 1457  Vitals shown include unvalidated device data.  Last Pain:  Vitals:   07/24/22 1124  TempSrc: Temporal  PainSc: 0-No pain         Complications: No notable events documented.

## 2022-07-24 NOTE — Progress Notes (Signed)
Extra time taken in post op to do JP drain teaching as well as general post op directions including medications, diet etc.  Wife, patient, and daughter all verbalized understanding.

## 2022-07-30 ENCOUNTER — Ambulatory Visit (INDEPENDENT_AMBULATORY_CARE_PROVIDER_SITE_OTHER): Payer: PPO | Admitting: Physician Assistant

## 2022-07-30 ENCOUNTER — Encounter: Payer: Self-pay | Admitting: Physician Assistant

## 2022-07-30 VITALS — BP 149/90 | HR 68 | Temp 98.0°F | Ht 65.0 in | Wt 174.0 lb

## 2022-07-30 DIAGNOSIS — Z09 Encounter for follow-up examination after completed treatment for conditions other than malignant neoplasm: Secondary | ICD-10-CM

## 2022-07-30 DIAGNOSIS — K8010 Calculus of gallbladder with chronic cholecystitis without obstruction: Secondary | ICD-10-CM

## 2022-07-30 DIAGNOSIS — Z9889 Other specified postprocedural states: Secondary | ICD-10-CM

## 2022-07-30 NOTE — Progress Notes (Signed)
[facility] POST-OP OFFICE VISIT  07/30/2022  HPI: GRYPHON MAUTE is a 82 y.o. male 6 days s/p robotic assisted laparoscopic cholecystectomy for [facility] with Dr Claudine Mouton   He has done very well Still with abdominal soreness but this is improving daily No fever, chills, nausea, emesis He is tolerating PO; no bowel issues reportedly Drain serosanguinous; <20 ccs daily  Incisions healing well Ambulating well   Vital signs: BP (!) 149/90   Pulse 68   Temp 98 F (36.7 C)   Ht 5\' 5"  (1.651 m)   Wt 174 lb (78.9 kg)   SpO2 97%   BMI 28.96 kg/m    Physical Exam: Constitutional: Well appearing male, NAD Abdomen: Soft, non-tender, non-distended, no rebound/guarding. Surgical drain in right lateral port site; serosanguinous (removed) Skin: Laparoscopic incisions are healing well, no erythema or drainage   Assessment/Plan: This is a 82 y.o. male 6 days s/p robotic assisted laparoscopic cholecystectomy for [facility] with Dr Claudine Mouton    - Drain removed; dressing placed   - Pain control prn  - Reviewed wound care recommendation  - Reviewed lifting restrictions; 4 weeks total  - Surgical pathology still pending   - He can follow up on as needed basis; He understands to call with questions/concerns  -- Lynden Oxford, PA-C [facility] 07/30/2022, 3:21 PM M-F: 7am - 4pm

## 2022-07-30 NOTE — Patient Instructions (Addendum)
You may shower.  Keep the dressing place for 2 days, then cover with a Band-Aid until it closes fully.   Follow-up with our office as needed.  Please call and ask to speak with a nurse if you develop questions or concerns.   GENERAL POST-OPERATIVE PATIENT INSTRUCTIONS   WOUND CARE INSTRUCTIONS:  Try to keep the wound dry and avoid ointments on the wound unless directed to do so.  If the wound becomes bright red and painful or starts to drain infected material that is not clear, please contact your physician immediately.  If the wound is mildly pink and has a thick firm ridge underneath it, this is normal, and is referred to as a healing ridge.  This will resolve over the next 4-6 weeks.  BATHING: You may shower if you have been informed of this by your surgeon. However, Please do not submerge in a tub, hot tub, or pool until incisions are completely sealed or have been told by your surgeon that you may do so.  DIET:  You may eat any foods that you can tolerate.  It is a good idea to eat a high fiber diet and take in plenty of fluids to prevent constipation.  If you do become constipated you may want to take a mild laxative or take ducolax tablets on a daily basis until your bowel habits are regular.  Constipation can be very uncomfortable, along with straining, after recent surgery.  ACTIVITY: You are encouraged to walk and engage in light activity for the next two weeks.  You should not lift more than 20 pounds for 6 weeks total after surgery as it could put you at increased risk for complications.  Twenty pounds is roughly equivalent to a plastic bag of groceries. At that time- Listen to your body when lifting, if you have pain when lifting, stop and then try again in a few days. Soreness after doing exercises or activities of daily living is normal as you get back in to your normal routine.  MEDICATIONS:  Try to take narcotic medications and anti-inflammatory medications, such as tylenol,  ibuprofen, naprosyn, etc., with food.  This will minimize stomach upset from the medication.  Should you develop nausea and vomiting from the pain medication, or develop a rash, please discontinue the medication and contact your physician.  You should not drive, make important decisions, or operate machinery when taking narcotic pain medication.  SUNBLOCK Use sun block to incision area over the next year if this area will be exposed to sun. This helps decrease scarring and will allow you avoid a permanent darkened area over your incision.  QUESTIONS:  Please feel free to call our office if you have any questions, and we will be glad to assist you. [phone]

## 2022-08-02 NOTE — Anesthesia Postprocedure Evaluation (Signed)
Anesthesia Post Note  Patient: Jon Mejia  Procedure(s) Performed: XI ROBOTIC ASSISTED LAPAROSCOPIC CHOLECYSTECTOMY INDOCYANINE GREEN FLUORESCENCE IMAGING (ICG)  Patient location during evaluation: PACU Anesthesia Type: General Level of consciousness: awake and alert Pain management: pain level controlled Vital Signs Assessment: post-procedure vital signs reviewed and stable Respiratory status: spontaneous breathing, nonlabored ventilation, respiratory function stable and patient connected to nasal cannula oxygen Cardiovascular status: blood pressure returned to baseline and stable Postop Assessment: no apparent nausea or vomiting Anesthetic complications: no   No notable events documented.   Last Vitals:  Vitals:   07/24/22 1628 07/24/22 1710  BP: 123/63 116/64  Pulse: (!) 59 (!) 57  Resp: 15 14  Temp: (!) 36.1 C (!) 36.1 C  SpO2: 97% 96%    Last Pain:  Vitals:   07/25/22 1627  TempSrc:   PainSc: 2                  Lenard Simmer

## 2022-10-08 ENCOUNTER — Encounter: Payer: PPO | Admitting: Family Medicine

## 2022-10-22 ENCOUNTER — Other Ambulatory Visit: Payer: Self-pay | Admitting: Pharmacist

## 2022-10-22 NOTE — Progress Notes (Signed)
10/22/2022  Patient ID: Jon Mejia, male   DOB: Jan 11, 1941, 82 y.o.   MRN: [id number]  Pharmacy Quality Measure Review  This patient is appearing on a report for being at risk of failing the adherence measure for cholesterol (statin) medications this calendar year.   Medication: Atorvastatin 40mg  Last fill date: 10/18/22 for 90 day supply  Insurance report was not up to date. No action needed at this time.     Marlowe Aschoff, PharmD [facility] Phone Number: [phone]

## 2022-11-15 ENCOUNTER — Ambulatory Visit: Payer: PPO

## 2022-11-15 ENCOUNTER — Ambulatory Visit: Payer: PPO | Admitting: Physician Assistant

## 2022-11-15 ENCOUNTER — Other Ambulatory Visit (HOSPITAL_COMMUNITY): Payer: PPO

## 2022-11-15 ENCOUNTER — Ambulatory Visit (HOSPITAL_COMMUNITY): Payer: PPO | Attending: Internal Medicine

## 2022-11-15 VITALS — BP 158/70 | HR 55 | Ht 65.0 in | Wt 179.6 lb

## 2022-11-15 DIAGNOSIS — Z952 Presence of prosthetic heart valve: Secondary | ICD-10-CM | POA: Diagnosis not present

## 2022-11-15 DIAGNOSIS — Z8673 Personal history of transient ischemic attack (TIA), and cerebral infarction without residual deficits: Secondary | ICD-10-CM | POA: Insufficient documentation

## 2022-11-15 DIAGNOSIS — I10 Essential (primary) hypertension: Secondary | ICD-10-CM | POA: Diagnosis not present

## 2022-11-15 DIAGNOSIS — I480 Paroxysmal atrial fibrillation: Secondary | ICD-10-CM

## 2022-11-15 DIAGNOSIS — K828 Other specified diseases of gallbladder: Secondary | ICD-10-CM

## 2022-11-15 DIAGNOSIS — I447 Left bundle-branch block, unspecified: Secondary | ICD-10-CM

## 2022-11-15 DIAGNOSIS — I517 Cardiomegaly: Secondary | ICD-10-CM

## 2022-11-15 DIAGNOSIS — I080 Rheumatic disorders of both mitral and aortic valves: Secondary | ICD-10-CM | POA: Diagnosis not present

## 2022-11-15 LAB — ECHOCARDIOGRAM COMPLETE
AR max vel: 2.11 cm2
AV Area VTI: 2.31 cm2
AV Area mean vel: 2.19 cm2
AV Mean grad: 10 mm[Hg]
AV Peak grad: 17.8 mm[Hg]
Ao pk vel: 2.11 m/s
Area-P 1/2: 3.6 cm2
S' Lateral: 3.2 cm

## 2022-11-15 MED ORDER — AMOXICILLIN 500 MG PO TABS: 2000.0000 mg | ORAL_TABLET | ORAL | 12 refills | Status: DC

## 2022-11-15 NOTE — Progress Notes (Signed)
[facility]   MULTIDISCIPLINARY HEART VALVE CLINIC                                     Cardiology Office Note:    Date:  11/15/2022   ID:  Jon Mejia, DOB January 10, 1941, MRN [id number]  PCP:  Malva Limes, MD  [facility] Cardiologist:  Julien Nordmann, MD / Dr. Clifton James, MD & Dr. Leafy Ro, MD (TAVR)   [facility] Electrophysiologist:  None   Referring MD: Malva Limes, MD   1 year s/p TAVR  History of Present Illness:    Jon Mejia is a 82 y.o. 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    [name] is followed by Dr. Mariah Milling for his cardiology care. He had a stroke in 02/2018. He was found to have atrial fibrillation and Eliquis was started. Echocardiogram 03/20/21 showed normal LVEF, mild LVH, mild MR, and LFLG severe valve stenosis. 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 He was evaluated by the multidisciplinary valve team and underwent successful TAVR with a 26 mm Edwards Sapien 3 THV via the TF approach on 11/06/21. 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 He was continued on Eliquis monotherapy. ECG showed new LBBB and he was discharged with a Zio monitor which only showed SVT but no HAVB.   Today the patient presents to clinic for follow up. Here with wife. No CP or SOB. No LE edema, orthopnea or PND. No dizziness or syncope. No blood in stool or urine. No palpitations.    Past Medical History:  Diagnosis Date   Angina pectoris (HCC)    Aortic atherosclerosis (HCC)    Aortic stenosis    a.) TTE 03/14/2018: mod AS (MPG 14); b.) TTE 09/13/2019: mild AS (MPG 12.5); c.) TTE 08/16/2021: mod AS (MPG 18.5); d.) R/LHC 09/17/2021: mRA 3, mPA 19, mPCWP 11, LVEDP 15, CO 5.22, CI 2.75; e.) s/p TAVR  11/06/2021   CAD (coronary artery disease) 09/17/2021   a.) R/LHC 09/17/2021: 30% pRCA, 40% dRCA, 50% pLCx, 30% dLCx, 50% pLAD, 50% mLAD, 50% dLAD --> med mgmt   Cardiac murmur    Cervical spondylosis    Cholelithiasis    CRAO (central retinal artery occlusion), 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   CVA (cerebral vascular accident) (HCC) 03/13/2018   a.) MRI brain 03/14/2018 --> acute infarct in the posterior RIGHT frontal lobe; small chronic cerebellar infarct --> Tx'd with TPA with (+) improvement/resolution of symptoms   Diastolic dysfunction    a.) TTE 01/31/2015: EF 55-60%, MAC, G1DD; b.) TTE 03/14/2018: EF 60-65%, mild LAE, sev MAC, sev AoV cal with mod AS (MPG 14) G2DD; c.) TTE 09/13/2019: EF 55-60%, mild LAE, mild-mod MR, mild AS (MPG 12.5), G2DD; d.) TTE 08/16/2021: EF 60-65%, mild LVH, mod MAC, mild MR, mod AS (MPG 18.5), G1DD; e.) TTE 12/05/2021: EF 60-65%, mod MAC, s/p TAVR (well seated/functioning valve), mild MR, G1DD   GERD (gastroesophageal reflux disease)    History of chicken pox    History of measles    Hyperlipidemia    Hypertension    LBBB (left bundle branch block)    Long term current use  of anticoagulant    a.) apixaban   Low back pain radiating to left lower extremity    PAF (paroxysmal atrial fibrillation) (HCC)    a.) CHA2DS2VASc = 6 (age x2, HTN, CVA x2, vascular disease history);  b.) rate/rhythm maintained on oral metoprolol succinate; chronically anticoagulated with apixaban   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   a.) 26mm S3UR via RIGHT TF approach   Skin cancer     Past Surgical History:  Procedure Laterality Date   INTRAOPERATIVE TRANSTHORACIC ECHOCARDIOGRAM N/A 11/06/2021   Procedure: INTRAOPERATIVE TRANSTHORACIC ECHOCARDIOGRAM;  Surgeon: Kathleene Hazel, MD;  Location: [facility] OR;  Service: Open Heart Surgery;  Laterality: N/A;   RIGHT/LEFT HEART CATH AND CORONARY ANGIOGRAPHY N/A 09/17/2021   Procedure: RIGHT/LEFT HEART CATH AND CORONARY  ANGIOGRAPHY;  Surgeon: Kathleene Hazel, MD;  Location: [facility] INVASIVE CV LAB;  Service: Cardiovascular;  Laterality: N/A;   SKIN CANCER EXCISION  07/2020   TRANSCATHETER AORTIC VALVE REPLACEMENT, TRANSFEMORAL N/A 11/06/2021   Procedure: Transcatheter Aortic Valve Replacement, Transfemoral;  Surgeon: Kathleene Hazel, MD;  Location: [facility] OR;  Service: Open Heart Surgery;  Laterality: N/A;    Current Medications: Current Meds  Medication Sig   amLODipine (NORVASC) 5 MG tablet Take 1 tablet (5 mg total) by mouth daily.   amoxicillin (AMOXIL) 500 MG tablet Take 4 tablets (2,000 mg total) by mouth as directed. 1 hour prior to dental work including cleanings   apixaban (ELIQUIS) 5 MG TABS tablet TAKE ONE (1) TABLET BY MOUTH TWO TIMES PER DAYTAKE ONE (1) TABLET BY MOUTH TWO TIMES PER DAY, may resume on Friday AM, June 28.   atorvastatin (LIPITOR) 40 MG tablet TAKE 1 TABLET BY MOUTH DAILY   Cyanocobalamin (B-12) 3000 MCG CAPS Take 3,000 mcg by mouth daily as needed (energy).   ibuprofen (ADVIL) 800 MG tablet Take 1 tablet (800 mg total) by mouth every 8 (eight) hours as needed.   metoprolol succinate (TOPROL-XL) 25 MG 24 hr tablet Take 1.5 tablets (37.5 mg total) by mouth daily.   omeprazole (PRILOSEC) 40 MG capsule TAKE 1 CAPSULE BY MOUTH DAILY.      ROS:   Please see the history of present illness.    All other systems reviewed and are negative.  EKGs   EKG:  EKG is NOT ordered today.   Recent Labs: 07/22/2022: ALT 16; BUN 13; Creatinine, Ser 0.95; Hemoglobin 13.2; Platelets 283; Potassium 3.8; Sodium 138  Recent Lipid Panel    Component Value Date/Time   CHOL 148 09/05/2021 1529   TRIG 154 (H) 09/05/2021 1529   HDL 39 (L) 09/05/2021 1529   CHOLHDL 3.8 09/05/2021 1529   CHOLHDL 4.5 03/14/2018 0711   VLDL 18 03/14/2018 0711   LDLCALC 82 09/05/2021 1529   LDLCALC 119 (H) 01/09/2017 0906     Risk Assessment/Calculations:    CHA2DS2-VASc Score = 6   This indicates a 9.7%  annual risk of stroke. The patient's score is based upon: CHF History: 0 HTN History: 1 Diabetes History: 0 Stroke History: 2 Vascular Disease History: 1 Age Score: 2 Gender Score: 0           Physical Exam:    VS:  BP (!) 158/70   Pulse (!) 55   Ht 5\' 5"  (1.651 m)   Wt 179 lb 9.6 oz (81.5 kg)   SpO2 97%   BMI 29.89 kg/m     Wt Readings from Last 3 Encounters:  11/15/22 179 lb 9.6 oz (81.5 kg)  07/30/22 174 lb (78.9 kg)  07/24/22 175 lb (79.4 kg)     GEN: Well nourished, well developed in no acute distress NECK: No JVD; No carotid bruits CARDIAC: RRR, no murmurs, rubs, gallops RESPIRATORY:  Clear to auscultation without rales, wheezing or rhonchi  ABDOMEN: Soft, non-tender, non-distended EXTREMITIES:  No edema; No deformity   ASSESSMENT:    1. S/P TAVR (transcatheter aortic valve replacement)   2. LBBB (left bundle branch block)   3. PAF (paroxysmal atrial fibrillation) (HCC)   4. History of CVA (cerebrovascular accident)   5. Primary hypertension    PLAN:    In order of problems listed above:  Severe AS s/p TAVR: echo today shows EF 55%, normally functioning TAVR with a mean gradient of 10 mm hg and trivial PVL as well as mild MR. He has NYHA class I symptoms. Continue Eliquis 5mg  BID. Continue with lifelong dental SBE with amoxicillin. Refill sent in today. He will continued regular fllow up with Dr. Mariah Milling   New LBBB: follow up monitor with no HAVB.    Paroxsymal atrial fibrillation: sounds regular on exam today. Continue Eliquis 5mg  BID and Toprol XL 37.5mg  daily.    Hx of CVA: Continue Eliquis and atorvastatin 40mg  daily.    HTN: BP elevated initially but 120/70 on my persona recheck. Continue Norvasc 5mg  daily and Toprol XL 37.5mg  daily.    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 Follow up US 10/16/21 showed gallbladder sludge and  stones with no sonographic evidence of acute cholecystitis. He is now s/p robotic assisted laparoscopic cholecystectomy.         Medication Adjustments/Labs and Tests Ordered: Current medicines are reviewed at length with the patient today.  Concerns regarding medicines are outlined above.  No orders of the defined types were placed in this encounter.  Meds ordered this encounter  Medications   amoxicillin (AMOXIL) 500 MG tablet    Sig: Take 4 tablets (2,000 mg total) by mouth as directed. 1 hour prior to dental work including cleanings    Dispense:  12 tablet    Refill:  12    Order Specific Question:   Supervising Provider    Answer:   Tonny Bollman [[id number]]    Patient Instructions  Medication Instructions:  Your physician recommends that you continue on your current medications as directed. Please refer to the Current Medication list given to you today.   *If you need a refill on your cardiac medications before your next appointment, please call your pharmacy*   Lab Work: None ordered   If you have labs (blood work) drawn today and your tests are completely normal, you will receive your results only by: MyChart Message (if you have MyChart) OR A paper copy in the mail If you have any lab test that is abnormal or we need to change your treatment, we will call you to review the results.   Testing/Procedures: None ordered    Follow-Up: At [facility], you and your health needs are our priority.  As part of our continuing mission to provide you with exceptional heart care, we have created designated Provider Care Teams.  These Care Teams include your primary Cardiologist (physician) and Advanced Practice Providers (APPs -  Physician Assistants and Nurse Practitioners) who all work together to provide you with the care you need, when you need it.  We recommend signing up for the  patient portal called "MyChart".  Sign up information is provided on this After Visit  Summary.  MyChart is used to connect with patients for Virtual Visits (Telemedicine).  Patients are able to view lab/test results, encounter notes, upcoming appointments, etc.  Non-urgent messages can be sent to your provider as well.   To learn more about what you can do with MyChart, go to [email or web address].    Your next appointment:   6 month(s)  Provider:   Julien Nordmann, MD     Other Instructions     Signed, Cline Crock, PA-C  11/15/2022 2:49 PM    [facility]

## 2022-11-15 NOTE — Patient Instructions (Signed)
Medication Instructions:  Your physician recommends that you continue on your current medications as directed. Please refer to the Current Medication list given to you today.   *If you need a refill on your cardiac medications before your next appointment, please call your pharmacy*   Lab Work: None ordered   If you have labs (blood work) drawn today and your tests are completely normal, you will receive your results only by: MyChart Message (if you have MyChart) OR A paper copy in the mail If you have any lab test that is abnormal or we need to change your treatment, we will call you to review the results.   Testing/Procedures: None ordered    Follow-Up: At [facility], you and your health needs are our priority.  As part of our continuing mission to provide you with exceptional heart care, we have created designated Provider Care Teams.  These Care Teams include your primary Cardiologist (physician) and Advanced Practice Providers (APPs -  Physician Assistants and Nurse Practitioners) who all work together to provide you with the care you need, when you need it.  We recommend signing up for the patient portal called "MyChart".  Sign up information is provided on this After Visit Summary.  MyChart is used to connect with patients for Virtual Visits (Telemedicine).  Patients are able to view lab/test results, encounter notes, upcoming appointments, etc.  Non-urgent messages can be sent to your provider as well.   To learn more about what you can do with MyChart, go to [email or web address].    Your next appointment:   6 month(s)  Provider:   Julien Nordmann, MD     Other Instructions

## 2022-11-25 ENCOUNTER — Ambulatory Visit: Payer: PPO | Admitting: Family

## 2022-11-28 ENCOUNTER — Ambulatory Visit: Payer: PPO | Admitting: Family

## 2022-12-03 ENCOUNTER — Ambulatory Visit: Payer: PPO | Admitting: Family

## 2022-12-17 ENCOUNTER — Ambulatory Visit: Payer: PPO

## 2022-12-17 DIAGNOSIS — Z0000 Encounter for general adult medical examination without abnormal findings: Secondary | ICD-10-CM | POA: Diagnosis not present

## 2022-12-17 NOTE — Progress Notes (Signed)
Subjective:   Jon Mejia is a 82 y.o. male who presents for Medicare Annual/Subsequent preventive examination.  Visit Complete: Virtual I connected with  Jon Mejia on 12/17/22 by a audio enabled telemedicine application and verified that I am speaking with the correct person using two identifiers.  Patient Location: Home  Provider Location: Office/Clinic  I discussed the limitations of evaluation and management by telemedicine. The patient expressed understanding and agreed to proceed.  Vital Signs: Because this visit was a virtual/telehealth visit, some criteria may be missing or patient reported. Any vitals not documented were not able to be obtained and vitals that have been documented are patient reported.  Cardiac Risk Factors include: advanced age (>12men, >23 women);hypertension;male gender     Objective:    There were no vitals filed for this visit. There is no height or weight on file to calculate BMI.     12/17/2022   10:49 AM 07/24/2022   11:21 AM 07/19/2022   12:44 PM 11/06/2021    6:30 AM 09/17/2021    7:35 AM 08/25/2019    9:32 AM 03/26/2018    7:50 AM  Advanced Directives  Does Patient Have a Medical Advance Directive? No Yes Yes Yes Yes Yes Yes  Type of Advance Directive  Living will;Healthcare Power of Attorney Living will;Healthcare Power of [employer] Power of [place];Living will Healthcare Power of [place];Living will Healthcare Power of [place];Living will Healthcare Power of [place];Living will  Does patient want to make changes to medical advance directive?  No - Patient declined  No - Patient declined No - Patient declined  No - Patient declined  Copy of Healthcare Power of Attorney in Chart?  No - copy requested  No - copy requested No - copy requested No - copy requested No - copy requested  Would patient like information on creating a medical advance directive? No - Patient declined          Current Medications  (verified) Outpatient Encounter Medications as of 12/17/2022  Medication Sig   amLODipine (NORVASC) 5 MG tablet Take 1 tablet (5 mg total) by mouth daily.   apixaban (ELIQUIS) 5 MG TABS tablet TAKE ONE (1) TABLET BY MOUTH TWO TIMES PER DAYTAKE ONE (1) TABLET BY MOUTH TWO TIMES PER DAY, may resume on Friday AM, June 28.   atorvastatin (LIPITOR) 40 MG tablet TAKE 1 TABLET BY MOUTH DAILY   Cyanocobalamin (B-12) 3000 MCG CAPS Take 3,000 mcg by mouth daily as needed (energy).   ibuprofen (ADVIL) 800 MG tablet Take 1 tablet (800 mg total) by mouth every 8 (eight) hours as needed.   metoprolol succinate (TOPROL-XL) 25 MG 24 hr tablet Take 1.5 tablets (37.5 mg total) by mouth daily.   amoxicillin (AMOXIL) 500 MG tablet Take 4 tablets (2,000 mg total) by mouth as directed. 1 hour prior to dental work including cleanings (Patient not taking: Reported on 12/17/2022)   omeprazole (PRILOSEC) 40 MG capsule TAKE 1 CAPSULE BY MOUTH DAILY. (Patient not taking: Reported on 12/17/2022)   No facility-administered encounter medications on file as of 12/17/2022.    Allergies (verified) Patient has no known allergies.   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   CAD (coronary artery disease) 09/17/2021   a.) R/LHC 09/17/2021: 30% pRCA, 40% dRCA, 50% pLCx, 30% dLCx, 50% pLAD, 50% mLAD, 50% dLAD --> med mgmt   Cardiac murmur    Cervical spondylosis    Cholelithiasis    CRAO (central retinal artery occlusion), 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   CVA (cerebral vascular accident) (HCC) 03/13/2018   a.) MRI brain 03/14/2018 --> acute infarct in the posterior RIGHT frontal lobe; small chronic cerebellar infarct --> Tx'd with TPA with (+)  improvement/resolution of symptoms   Diastolic dysfunction    a.) TTE 01/31/2015: EF 55-60%, MAC, G1DD; b.) TTE 03/14/2018: EF 60-65%, mild LAE, sev MAC, sev AoV cal with mod AS (MPG 14) G2DD; c.) TTE 09/13/2019: EF 55-60%, mild LAE, mild-mod MR, mild AS (MPG 12.5), G2DD; d.) TTE 08/16/2021: EF 60-65%, mild LVH, mod MAC, mild MR, mod AS (MPG 18.5), G1DD; e.) TTE 12/05/2021: EF 60-65%, mod MAC, s/p TAVR (well seated/functioning valve), mild MR, G1DD   GERD (gastroesophageal reflux disease)    History of chicken pox    History of measles    Hyperlipidemia    Hypertension    LBBB (left bundle branch block)    Long term current use of anticoagulant    a.) apixaban   Low back pain radiating to left lower extremity    PAF (paroxysmal atrial fibrillation) (HCC)    a.) CHA2DS2VASc = 6 (age x2, HTN, CVA x2, vascular disease history);  b.) rate/rhythm maintained on oral metoprolol succinate; chronically anticoagulated with apixaban   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   a.) 26mm S3UR via RIGHT TF approach   Skin cancer    Past Surgical History:  Procedure Laterality Date   INTRAOPERATIVE TRANSTHORACIC ECHOCARDIOGRAM N/A 11/06/2021   Procedure: INTRAOPERATIVE TRANSTHORACIC ECHOCARDIOGRAM;  Surgeon: Kathleene Hazel, MD;  Location: [facility] OR;  Service: Open Heart Surgery;  Laterality: N/A;   RIGHT/LEFT HEART CATH AND CORONARY ANGIOGRAPHY N/A 09/17/2021   Procedure: RIGHT/LEFT HEART CATH AND CORONARY ANGIOGRAPHY;  Surgeon: Kathleene Hazel, MD;  Location: [facility] INVASIVE CV LAB;  Service: Cardiovascular;  Laterality: N/A;   SKIN CANCER EXCISION  07/2020   TRANSCATHETER AORTIC VALVE REPLACEMENT, TRANSFEMORAL N/A 11/06/2021   Procedure: Transcatheter Aortic Valve Replacement, Transfemoral;  Surgeon: Kathleene Hazel, MD;  Location: [facility] OR;  Service: Open Heart Surgery;  Laterality: N/A;   Family History  Problem Relation Age of Onset   Arrhythmia Mother         A-Fib   Lung cancer Father    Diabetes Son    Colon cancer Neg Hx    Prostate cancer Neg Hx    Social History   Socioeconomic History   Marital status: Married    Spouse name: [name]   Number of children: 2   Years of education: Not on file   Highest education level: 12th grade  Occupational History   Occupation: Retired   Occupation: Retired [employer]  Tobacco Use   Smoking status: Former    Current packs/day: 0.00    Average packs/day: 1 pack/day for 20.0 years (20.0 ttl pk-yrs)    Types: Cigarettes    Start date: 03/12/1963    Quit date: 03/12/1983    Years since quitting: 39.7    Passive exposure: Past   Smokeless tobacco: Never  Vaping Use   Vaping status: Never Used  Substance and Sexual Activity   Alcohol use: No   Drug use: No   Sexual activity: Not  on file  Other Topics Concern   Not on file  Social History Narrative   Not on file   Social Determinants of Health   Financial Resource Strain: Low Risk  (12/17/2022)   Overall Financial Resource Strain (CARDIA)    Difficulty of Paying Living Expenses: Not very hard  Food Insecurity: No Food Insecurity (12/17/2022)   Hunger Vital Sign    Worried About Running Out of Food in the Last Year: Never true    Ran Out of Food in the Last Year: Never true  Transportation Needs: No Transportation Needs (12/17/2022)   PRAPARE - Administrator, Civil Service (Medical): No    Lack of Transportation (Non-Medical): No  Physical Activity: Sufficiently Active (12/17/2022)   Exercise Vital Sign    Days of Exercise per Week: 5 days    Minutes of Exercise per Session: 40 min  Stress: No Stress Concern Present (12/17/2022)   [employer] of Occupational Health - Occupational Stress Questionnaire    Feeling of Stress : Not at all  Social Connections: Socially Integrated (12/17/2022)   Social Connection and Isolation Panel [NHANES]    Frequency of Communication with Friends and Family: More than three  times a week    Frequency of Social Gatherings with Friends and Family: More than three times a week    Attends Religious Services: More than 4 times per year    Active Member of [employer] or Organizations: Yes    Attends Engineer, structural: More than 4 times per year    Marital Status: Married    Tobacco Counseling Counseling given: Not Answered   Clinical Intake:  Pre-visit preparation completed: Yes  Pain : No/denies pain     BMI - recorded: 29.8 Nutritional Status: BMI 25 -29 Overweight Nutritional Risks: None Diabetes: No  How often do you need to have someone help you when you read instructions, pamphlets, or other written materials from your doctor or pharmacy?: 1 - Never  Interpreter Needed?: No  Information entered by :: Kennedy Bucker, LPN   Activities of Daily Living    12/17/2022   10:50 AM 12/14/2022    5:15 PM  In your present state of health, do you have any difficulty performing the following activities:  Hearing? 0 0  Vision? 0 0  Difficulty concentrating or making decisions? 0 0  Walking or climbing stairs? 0 0  Dressing or bathing? 0 0  Doing errands, shopping? 0 0  Preparing Food and eating ? N N  Using the Toilet? N N  In the past six months, have you accidently leaked urine? N N  Do you have problems with loss of bowel control? N N  Managing your Medications? N N  Managing your Finances? N N  Housekeeping or managing your Housekeeping? N N    Patient Care Team: Malva Limes, MD as PCP - General (Family Medicine) Mariah Milling Tollie Pizza, MD as PCP - Cardiology (Cardiology) Blair Promise, OD as Consulting Physician (Optometry) Mariah Milling, Tollie Pizza, MD as Consulting Physician (Cardiology)  Indicate any recent Medical Services you may have received from other than [facility] providers in the past year (date may be approximate).     Assessment:   This is a routine wellness examination for [facility].  Hearing/Vision screen Hearing  Screening - Comments:: No aids Vision Screening - Comments:: Readers- Dr.Bulakowski    Goals Addressed             This Visit's Progress    DIET -  EAT MORE FRUITS AND VEGETABLES         Depression Screen    12/17/2022   10:48 AM 05/16/2022    1:31 PM 10/05/2021    9:14 AM 10/03/2020    9:14 AM 08/25/2019    9:27 AM 08/21/2018    3:06 PM 04/29/2017    8:44 AM  PHQ 2/9 Scores  PHQ - 2 Score 0 0 0 0 0 0 0  PHQ- 9 Score 0 3 3 0  0 0    Fall Risk    12/17/2022   10:50 AM 12/14/2022    5:15 PM 05/16/2022    1:31 PM 10/05/2021    9:14 AM 10/02/2021   10:56 AM  Fall Risk   Falls in the past year? 0 0 0 0 0  Number falls in past yr: 0  0 0 0  Injury with Fall? 0  0 0 0  Risk for fall due to : No Fall Risks   No Fall Risks   Follow up Falls prevention discussed;Falls evaluation completed   Falls evaluation completed     MEDICARE RISK AT HOME: Medicare Risk at Home Any stairs in or around the home?: Yes If so, are there any without handrails?: No Home free of loose throw rugs in walkways, pet beds, electrical cords, etc?: Yes Adequate lighting in your home to reduce risk of falls?: Yes Life alert?: No Use of a cane, walker or w/c?: No Grab bars in the bathroom?: Yes Shower chair or bench in shower?: No Elevated toilet seat or a handicapped toilet?: No  TIMED UP AND GO:  Was the test performed?  No    Cognitive Function:        12/17/2022   10:51 AM  6CIT Screen  What Year? 0 points  What month? 0 points  What time? 0 points  Count back from 20 0 points  Months in reverse 2 points  Repeat phrase 0 points  Total Score 2 points    Immunizations Immunization History  Administered Date(s) Administered   Fluad Quad(high Dose 65+) 11/27/2018, 11/17/2019, 10/03/2020, 10/05/2021   Influenza, High Dose Seasonal PF 01/17/2014, 01/25/2016, 01/09/2017, 12/05/2017   Influenza,inj,Quad PF,6+ Mos 01/24/2015   PFIZER(Purple Top)SARS-COV-2 Vaccination 02/17/2019, 03/10/2019,  12/29/2019   Pneumococcal Conjugate-13 07/12/2013   Pneumococcal Polysaccharide-23 01/07/2008   Td 12/15/2003   Tdap 08/15/2015   Zoster Recombinant(Shingrix) 10/17/2020, 02/05/2021    TDAP status: Up to date  Flu Vaccine status: Declined, Education has been provided regarding the importance of this vaccine but patient still declined. Advised may receive this vaccine at local pharmacy or Health Dept. Aware to provide a copy of the vaccination record if obtained from local pharmacy or Health Dept. Verbalized acceptance and understanding.  Pneumococcal vaccine status: Due, Education has been provided regarding the importance of this vaccine. Advised may receive this vaccine at local pharmacy or Health Dept. Aware to provide a copy of the vaccination record if obtained from local pharmacy or Health Dept. Verbalized acceptance and understanding.  Covid-19 vaccine status: Completed vaccines  Qualifies for Shingles Vaccine? Yes   Zostavax completed No   Shingrix Completed?: Yes  Screening Tests Health Maintenance  Topic Date Due   COVID-19 Vaccine (4 - 2023-24 season) 09/29/2022   INFLUENZA VACCINE  04/28/2023 (Originally 08/29/2022)   Medicare Annual Wellness (AWV)  12/17/2023   DTaP/Tdap/Td (3 - Td or Tdap) 08/14/2025   Pneumonia Vaccine 30+ Years old  Completed   Zoster Vaccines- Shingrix  Completed  HPV VACCINES  Aged Out   Colonoscopy  Discontinued    Health Maintenance  Health Maintenance Due  Topic Date Due   COVID-19 Vaccine (4 - 2023-24 season) 09/29/2022    Colorectal cancer screening: No longer required.   Lung Cancer Screening: (Low Dose CT Chest recommended if Age 41-80 years, 20 pack-year currently smoking OR have quit w/in 15years.) does not qualify.    Additional Screening:  Hepatitis C Screening: does not qualify; Completed no  Vision Screening: Recommended annual ophthalmology exams for early detection of glaucoma and other disorders of the eye. Is the  patient up to date with their annual eye exam?  Yes  Who is the provider or what is the name of the office in which the patient attends annual eye exams? Dr.Bulakowski If pt is not established with a provider, would they like to be referred to a provider to establish care? No .   Dental Screening: Recommended annual dental exams for proper oral hygiene   Community Resource Referral / Chronic Care Management: CRR required this visit?  No   CCM required this visit?  No     Plan:     I have personally reviewed and noted the following in the patient's chart:   Medical and social history Use of alcohol, tobacco or illicit drugs  Current medications and supplements including opioid prescriptions. Patient is not currently taking opioid prescriptions. Functional ability and status Nutritional status Physical activity Advanced directives List of other physicians Hospitalizations, surgeries, and ER visits in previous 12 months Vitals Screenings to include cognitive, depression, and falls Referrals and appointments  In addition, I have reviewed and discussed with patient certain preventive protocols, quality metrics, and best practice recommendations. A written personalized care plan for preventive services as well as general preventive health recommendations were provided to patient.     Hal Hope, LPN   [id number]   After Visit Summary: (MyChart) Due to this being a telephonic visit, the after visit summary with patients personalized plan was offered to patient via MyChart   Nurse Notes: none

## 2022-12-17 NOTE — Patient Instructions (Addendum)
Jon Mejia , Thank you for taking time to come for your Medicare Wellness Visit. I appreciate your ongoing commitment to your health goals. Please review the following plan we discussed and let me know if I can assist you in the future.   Referrals/Orders/Follow-Ups/Clinician Recommendations: none  This is a list of the screening recommended for you and due dates:  Health Maintenance  Topic Date Due   COVID-19 Vaccine (4 - 2023-24 season) 09/29/2022   Flu Shot  04/28/2023*   Medicare Annual Wellness Visit  12/17/2023   DTaP/Tdap/Td vaccine (3 - Td or Tdap) 08/14/2025   Pneumonia Vaccine  Completed   Zoster (Shingles) Vaccine  Completed   HPV Vaccine  Aged Out   Colon Cancer Screening  Discontinued  *Topic was postponed. The date shown is not the original due date.    Advanced directives: (ACP Link)Information on Advanced Care Planning can be found at [facility] of [facility] Directives Advance Health Care Directives ([email or web address]   Next Medicare Annual Wellness Visit scheduled for next year: Yes    12/23/22 @ 10:10 am by video

## 2022-12-31 ENCOUNTER — Telehealth: Payer: Self-pay | Admitting: Family Medicine

## 2022-12-31 DIAGNOSIS — R7303 Prediabetes: Secondary | ICD-10-CM

## 2022-12-31 DIAGNOSIS — I10 Essential (primary) hypertension: Secondary | ICD-10-CM

## 2022-12-31 DIAGNOSIS — E7800 Pure hypercholesterolemia, unspecified: Secondary | ICD-10-CM

## 2022-12-31 NOTE — Telephone Encounter (Signed)
Patients spouse is requesting labs be done before patients appt to check his sugar. Please f/u with spouse

## 2023-01-01 NOTE — Telephone Encounter (Signed)
Have placed order, needs to be fasting.

## 2023-01-01 NOTE — Telephone Encounter (Signed)
Advised. Verbalized understandinf

## 2023-01-02 DIAGNOSIS — I10 Essential (primary) hypertension: Secondary | ICD-10-CM | POA: Diagnosis not present

## 2023-01-02 DIAGNOSIS — R7303 Prediabetes: Secondary | ICD-10-CM | POA: Diagnosis not present

## 2023-01-02 DIAGNOSIS — E7800 Pure hypercholesterolemia, unspecified: Secondary | ICD-10-CM | POA: Diagnosis not present

## 2023-01-03 ENCOUNTER — Ambulatory Visit: Payer: PPO | Admitting: Family Medicine

## 2023-01-03 ENCOUNTER — Encounter: Payer: Self-pay | Admitting: Family Medicine

## 2023-01-03 VITALS — BP 134/75 | HR 64 | Ht 65.0 in | Wt 184.0 lb

## 2023-01-03 DIAGNOSIS — I4891 Unspecified atrial fibrillation: Secondary | ICD-10-CM

## 2023-01-03 DIAGNOSIS — Z23 Encounter for immunization: Secondary | ICD-10-CM

## 2023-01-03 DIAGNOSIS — Z0000 Encounter for general adult medical examination without abnormal findings: Secondary | ICD-10-CM

## 2023-01-03 DIAGNOSIS — E7800 Pure hypercholesterolemia, unspecified: Secondary | ICD-10-CM

## 2023-01-03 DIAGNOSIS — I10 Essential (primary) hypertension: Secondary | ICD-10-CM

## 2023-01-03 DIAGNOSIS — Z0001 Encounter for general adult medical examination with abnormal findings: Secondary | ICD-10-CM | POA: Diagnosis not present

## 2023-01-03 DIAGNOSIS — Z952 Presence of prosthetic heart valve: Secondary | ICD-10-CM

## 2023-01-03 LAB — BASIC METABOLIC PANEL
BUN/Creatinine Ratio: 11 (ref 10–24)
BUN: 14 mg/dL (ref 8–27)
CO2: 21 mmol/L (ref 20–29)
Calcium: 9.5 mg/dL (ref 8.6–10.2)
Chloride: 105 mmol/L (ref 96–106)
Creatinine, Ser: 1.22 mg/dL (ref 0.76–1.27)
Glucose: 102 mg/dL — ABNORMAL HIGH (ref 70–99)
Potassium: 4.9 mmol/L (ref 3.5–5.2)
Sodium: 141 mmol/L (ref 134–144)
eGFR: 60 mL/min/{1.73_m2} (ref >59)

## 2023-01-03 LAB — LIPID PANEL
Chol/HDL Ratio: 3.7 {ratio} (ref 0.0–5.0)
Cholesterol, Total: 145 mg/dL (ref 100–199)
HDL: 39 mg/dL — ABNORMAL LOW (ref >39)
LDL Chol Calc (NIH): 89 mg/dL (ref 0–99)
Triglycerides: 92 mg/dL (ref 0–149)
VLDL Cholesterol Cal: 17 mg/dL (ref 5–40)

## 2023-01-03 LAB — TSH: TSH: 2.64 u[IU]/mL (ref 0.450–4.500)

## 2023-01-03 LAB — HEMOGLOBIN A1C
Est. average glucose Bld gHb Est-mCnc: 131 mg/dL
Hgb A1c MFr Bld: 6.2 % — ABNORMAL HIGH (ref 4.8–5.6)

## 2023-01-03 NOTE — Patient Instructions (Signed)
.   Please review the attached list of medications and notify my office if there are any errors.   . Please bring all of your medications to every appointment so we can make sure that our medication list is the same as yours.   

## 2023-01-07 ENCOUNTER — Encounter: Payer: Self-pay | Admitting: Family Medicine

## 2023-01-07 ENCOUNTER — Other Ambulatory Visit: Payer: Self-pay | Admitting: Cardiovascular Disease

## 2023-01-07 NOTE — Telephone Encounter (Signed)
Hi Lauren,  Will you please outreach patient to schedule OD 12 month follow up. POV 11/2021.  Thank you,  Ferne Coe

## 2023-01-07 NOTE — Progress Notes (Signed)
Complete physical exam   Patient: Jon Mejia   DOB: 08/21/1940   82 y.o. Male  MRN: [id number] Visit Date: 01/03/2023  Today's healthcare provider: Mila Merry, MD   Chief Complaint  Patient presents with   Annual Exam   Subjective    Discussed the use of AI scribe software for clinical note transcription with the patient, who gave verbal consent to proceed.  History of Present Illness   The patient, with a history of aortic valve replacement, presented for a routine physical. He reported overall good health with no new or worsening symptoms. He had a recent gallbladder removal due to recurrent attacks of gallbladder disease. Post-operatively, he reported no stomach problems or changes in bowel habits. He also reported no breathing difficulties, a significant improvement from his pre-operative state.  The patient's recent blood work showed slightly elevated blood sugar and cholesterol levels. He reported adherence to his prescribed atorvastatin and a diet low in sugar and sweets. Despite these measures, his cholesterol levels remained slightly higher than desired. The patient reported no side effects from the atorvastatin.  The patient is due for a follow-up with his cardiologist in the coming months. He also reported regular eye examinations, with the most recent one showing no abnormalities.        Past Medical History:  Diagnosis Date   Angina pectoris (HCC)    Aortic atherosclerosis (HCC)    Aortic stenosis    a.) TTE 03/14/2018: mod AS (MPG 14); b.) TTE 09/13/2019: mild AS (MPG 12.5); c.) TTE 08/16/2021: mod AS (MPG 18.5); d.) R/LHC 09/17/2021: mRA 3, mPA 19, mPCWP 11, LVEDP 15, CO 5.22, CI 2.75; e.) s/p TAVR 11/06/2021   CAD (coronary artery disease) 09/17/2021   a.) R/LHC 09/17/2021: 30% pRCA, 40% dRCA, 50% pLCx, 30% dLCx, 50% pLAD, 50% mLAD, 50% dLAD --> med mgmt   Cardiac murmur    Cervical spondylosis    Cholelithiasis    CRAO (central retinal artery  occlusion), 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   CVA (cerebral vascular accident) (HCC) 03/13/2018   a.) MRI brain 03/14/2018 --> acute infarct in the posterior RIGHT frontal lobe; small chronic cerebellar infarct --> Tx'd with TPA with (+) improvement/resolution of symptoms   Diastolic dysfunction    a.) TTE 01/31/2015: EF 55-60%, MAC, G1DD; b.) TTE 03/14/2018: EF 60-65%, mild LAE, sev MAC, sev AoV cal with mod AS (MPG 14) G2DD; c.) TTE 09/13/2019: EF 55-60%, mild LAE, mild-mod MR, mild AS (MPG 12.5), G2DD; d.) TTE 08/16/2021: EF 60-65%, mild LVH, mod MAC, mild MR, mod AS (MPG 18.5), G1DD; e.) TTE 12/05/2021: EF 60-65%, mod MAC, s/p TAVR (well seated/functioning valve), mild MR, G1DD   GERD (gastroesophageal reflux disease)    History of chicken pox    History of measles    Hyperlipidemia    Hypertension    LBBB (left bundle branch block)    Long term current use of anticoagulant    a.) apixaban   Low back pain radiating to left lower extremity    PAF (paroxysmal atrial fibrillation) (HCC)    a.) CHA2DS2VASc = 6 (age x2, HTN, CVA x2, vascular disease history);  b.) rate/rhythm maintained on oral metoprolol succinate; chronically anticoagulated with apixaban   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   a.) 26mm S3UR via RIGHT TF approach   Skin cancer    Past Surgical History:  Procedure Laterality Date   INTRAOPERATIVE TRANSTHORACIC ECHOCARDIOGRAM N/A 11/06/2021   Procedure: INTRAOPERATIVE TRANSTHORACIC ECHOCARDIOGRAM;  Surgeon: Verne Carrow  D, MD;  Location: [facility] OR;  Service: Open Heart Surgery;  Laterality: N/A;   RIGHT/LEFT HEART CATH AND CORONARY ANGIOGRAPHY N/A 09/17/2021   Procedure: RIGHT/LEFT HEART CATH AND CORONARY ANGIOGRAPHY;  Surgeon: Kathleene Hazel, MD;  Location: [facility] INVASIVE CV [facility];  Service: Cardiovascular;  Laterality: N/A;   ROBOTIC ASSISTED LAPAROSCOPIC CHOLECYSTECTOMY  07/24/2022   SKIN CANCER EXCISION  07/2020   TRANSCATHETER AORTIC VALVE  REPLACEMENT, TRANSFEMORAL N/A 11/06/2021   Procedure: Transcatheter Aortic Valve Replacement, Transfemoral;  Surgeon: Kathleene Hazel, MD;  Location: [facility] OR;  Service: Open Heart Surgery;  Laterality: N/A;   Social History   Socioeconomic History   Marital status: Married    Spouse name: [name]   Number of children: 2   Years of education: Not on file   Highest education level: 12th grade  Occupational History   Occupation: Retired   Occupation: Retired [employer]  Tobacco Use   Smoking status: Former    Current packs/day: 0.00    Average packs/day: 1 pack/day for 20.0 years (20.0 ttl pk-yrs)    Types: Cigarettes    Start date: 03/12/1963    Quit date: 03/12/1983    Years since quitting: 39.8    Passive exposure: Past   Smokeless tobacco: Never  Vaping Use   Vaping status: Never Used  Substance and Sexual Activity   Alcohol use: No   Drug use: No   Sexual activity: Not on file  Other Topics Concern   Not on file  Social History Narrative   Not on file   Social Determinants of Health   Financial Resource Strain: Low Risk  (12/17/2022)   Overall Financial Resource Strain (CARDIA)    Difficulty of Paying Living Expenses: Not very hard  Food Insecurity: No Food Insecurity (12/17/2022)   Hunger Vital Sign    Worried About Running Out of Food in the Last Year: Never true    Ran Out of Food in the Last Year: Never true  Transportation Needs: No Transportation Needs (12/17/2022)   PRAPARE - Administrator, Civil Service (Medical): No    Lack of Transportation (Non-Medical): No  Physical Activity: Sufficiently Active (12/17/2022)   Exercise Vital Sign    Days of Exercise per Week: 5 days    Minutes of Exercise per Session: 40 min  Stress: No Stress Concern Present (12/17/2022)   [employer] of Occupational Health - Occupational Stress Questionnaire    Feeling of Stress : Not at all  Social Connections: Socially Integrated (12/17/2022)    Social Connection and Isolation Panel [NHANES]    Frequency of Communication with Friends and Family: More than three times a week    Frequency of Social Gatherings with Friends and Family: More than three times a week    Attends Religious Services: More than 4 times per year    Active Member of [employer] or Organizations: Yes    Attends Banker Meetings: More than 4 times per year    Marital Status: Married  Catering manager Violence: Not At Risk (12/17/2022)   Humiliation, Afraid, Rape, and Kick questionnaire    Fear of Current or Ex-Partner: No    Emotionally Abused: No    Physically Abused: No    Sexually Abused: No   Family Status  Relation Name Status   Mother  Deceased   Father  Deceased at age 30   Sister  Alive   Brother  Alive   Daughter  Alive   Son  Alive  Neg Hx  (Not Specified)  No partnership data on file   Family History  Problem Relation Age of Onset   Arrhythmia Mother        A-Fib   Lung cancer Father    Diabetes Son    Colon cancer Neg Hx    Prostate cancer Neg Hx    No Known Allergies  Patient Care Team: Malva Limes, MD as PCP - General (Family Medicine) Mariah Milling, Tollie Pizza, MD as PCP - Cardiology (Cardiology) Blair Promise, OD as Consulting Physician (Optometry) Mariah Milling Tollie Pizza, MD as Consulting Physician (Cardiology)   Medications: Outpatient Medications Prior to Visit  Medication Sig   amLODipine (NORVASC) 5 MG tablet Take 1 tablet (5 mg total) by mouth daily.   amoxicillin (AMOXIL) 500 MG tablet Take 4 tablets (2,000 mg total) by mouth as directed. 1 hour prior to dental work including cleanings   apixaban (ELIQUIS) 5 MG TABS tablet TAKE ONE (1) TABLET BY MOUTH TWO TIMES PER DAYTAKE ONE (1) TABLET BY MOUTH TWO TIMES PER DAY, may resume on Friday AM, June 28.   atorvastatin (LIPITOR) 40 MG tablet TAKE 1 TABLET BY MOUTH DAILY   Cyanocobalamin (B-12) 3000 MCG CAPS Take 3,000 mcg by mouth daily as needed (energy).   ibuprofen  (ADVIL) 800 MG tablet Take 1 tablet (800 mg total) by mouth every 8 (eight) hours as needed.   metoprolol succinate (TOPROL-XL) 25 MG 24 hr tablet Take 1.5 tablets (37.5 mg total) by mouth daily.   omeprazole (PRILOSEC) 40 MG capsule TAKE 1 CAPSULE BY MOUTH DAILY.   No facility-administered medications prior to visit.    Review of Systems  Constitutional:  Negative for chills, diaphoresis and fever.  HENT:  Negative for congestion, ear discharge, ear pain, hearing loss, nosebleeds, sore throat and tinnitus.   Eyes:  Negative for photophobia, pain, discharge and redness.  Respiratory:  Negative for cough, shortness of breath, wheezing and stridor.   Cardiovascular:  Negative for chest pain, palpitations and leg swelling.  Gastrointestinal:  Negative for abdominal pain, blood in stool, constipation, diarrhea, nausea and vomiting.  Endocrine: Negative for polydipsia.  Genitourinary:  Negative for dysuria, flank pain, frequency, hematuria and urgency.  Musculoskeletal:  Negative for back pain, myalgias and neck pain.  Skin:  Negative for rash.  Allergic/Immunologic: Negative for environmental allergies.  Neurological:  Negative for dizziness, tremors, seizures, weakness and headaches.  Hematological:  Does not bruise/bleed easily.  Psychiatric/Behavioral:  Negative for hallucinations and suicidal ideas. The patient is not nervous/anxious.       Objective    BP 134/75 (BP Location: Left Arm, Patient Position: Sitting, Cuff Size: Normal)   Pulse 64   Ht 5\' 5"  (1.651 m)   Wt 184 lb (83.5 kg)   SpO2 100%   BMI 30.62 kg/m    Physical Exam  General Appearance:    Mildly obese male. Alert, cooperative, in no acute distress, appears stated age  Head:    Normocephalic, without obvious abnormality, atraumatic  Eyes:    PERRL, conjunctiva/corneas clear, EOM's intact, fundi    benign, both eyes       Ears:    Normal TM's and external ear canals, both ears  Nose:   Nares normal, septum  midline, mucosa normal, no drainage   or sinus tenderness  Throat:   Lips, mucosa, and tongue normal; teeth and gums normal  Neck:   Supple, symmetrical, trachea midline, no adenopathy;       thyroid:  No enlargement/tenderness/nodules;  no carotid   bruit or JVD  Back:     Symmetric, no curvature, ROM normal, no CVA tenderness  Lungs:     Clear to auscultation bilaterally, respirations unlabored  Chest wall:    No tenderness or deformity  Heart:    Normal heart rate. Regular rhythm.  1/6 blowing, holosystolic murmur at apex  Abdomen:     Soft, non-tender, bowel sounds active all four quadrants,    no masses, no organomegaly  Genitalia:    deferred  Rectal:    deferred  Extremities:   All extremities are intact. No cyanosis or edema  Pulses:   2+ and symmetric all extremities  Skin:   Skin color, texture, turgor normal, no rashes or lesions  Lymph nodes:   Cervical, supraclavicular, and axillary nodes normal  Neurologic:   CNII-XII intact. Normal strength, sensation and reflexes      throughout       Last depression screening scores    01/03/2023    2:06 PM 12/17/2022   10:48 AM 05/16/2022    1:31 PM  PHQ 2/9 Scores  PHQ - 2 Score 0 0 0  PHQ- 9 Score 0 0 3   Last fall risk screening    01/03/2023    2:06 PM  Fall Risk   Falls in the past year? 0  Number falls in past yr: 0  Injury with Fall? 0   Last Audit-C alcohol use screening    12/17/2022   10:47 AM  Alcohol Use Disorder Test (AUDIT)  1. How often do you have a drink containing alcohol? 0  2. How many drinks containing alcohol do you have on a typical day when you are drinking? 0  3. How often do you have six or more drinks on one occasion? 0  AUDIT-C Score 0   A score of 3 or more in women, and 4 or more in men indicates increased risk for alcohol abuse, EXCEPT if all of the points are from question 1     Assessment & Plan    Routine Health Maintenance and Physical Exam  Exercise Activities and Dietary  recommendations  Goals      DIET - EAT MORE FRUITS AND VEGETABLES     Exercise 3x per week (30 min per time)     Recommend to walk 3 days a week for 30 minutes at a time without stopping.          Immunization History  Administered Date(s) Administered   Fluad Quad(high Dose 65+) 11/27/2018, 11/17/2019, 10/03/2020, 10/05/2021   Fluad Trivalent(High Dose 65+) 01/03/2023   Influenza, High Dose Seasonal PF 01/17/2014, 01/25/2016, 01/09/2017, 12/05/2017   Influenza,inj,Quad PF,6+ Mos 01/24/2015   PFIZER(Purple Top)SARS-COV-2 Vaccination 02/17/2019, 03/10/2019, 12/29/2019   Pneumococcal Conjugate-13 07/12/2013   Pneumococcal Polysaccharide-23 01/07/2008   Td 12/15/2003   Tdap 08/15/2015   Zoster Recombinant(Shingrix) 10/17/2020, 02/05/2021    Health Maintenance  Topic Date Due   COVID-19 Vaccine (4 - 2023-24 season) 09/29/2022   Medicare Annual Wellness (AWV)  12/17/2023   DTaP/Tdap/Td (3 - Td or Tdap) 08/14/2025   Pneumonia Vaccine 37+ Years old  Completed   INFLUENZA VACCINE  Completed   Zoster Vaccines- Shingrix  Completed   HPV VACCINES  Aged Out   Colonoscopy  Discontinued    Discussed health benefits of physical activity, and encouraged him to engage in regular exercise appropriate for his age and condition.     -Administer influenza vaccine today. -Schedule follow-up appointment in 6 months  to monitor blood sugar and cholesterol levels.   Aortic Valve Replacement No reported issues post-procedure. Follow-up with Dr. Mariah Milling scheduled for February. -Continue current management and follow-up as planned.  Hyperlipidemia Cholesterol slightly elevated despite adherence to atorvastatin. No reported side effects. -Continue atorvastatin at current dose. -Recheck lipid panel in approximately 5-6 months.  Prediabetes Blood sugar averaging 131, indicating prediabetes. Patient reports efforts to limit sugar and sweets in diet. -Monitor blood sugar levels. -Encourage  continued dietary modifications to limit sugar intake.  Cholecystectomy No reported stomach problems or changes in bowel habits post-procedure. -Continue current management.     Return in about 6 months (around 07/04/2023) for prediabetes.      Mila Merry, MD  [facility] [phone] (phone) [phone] (fax)  [facility]

## 2023-01-09 ENCOUNTER — Encounter: Payer: Self-pay | Admitting: Neurology

## 2023-01-09 ENCOUNTER — Ambulatory Visit: Payer: PPO | Admitting: Neurology

## 2023-01-09 VITALS — BP 138/84 | HR 82 | Ht 65.0 in | Wt 183.6 lb

## 2023-01-09 DIAGNOSIS — I482 Chronic atrial fibrillation, unspecified: Secondary | ICD-10-CM | POA: Diagnosis not present

## 2023-01-09 DIAGNOSIS — H34231 Retinal artery branch occlusion, right eye: Secondary | ICD-10-CM | POA: Diagnosis not present

## 2023-01-09 MED ORDER — ATORVASTATIN CALCIUM 80 MG PO TABS
80.0000 mg | ORAL_TABLET | Freq: Every day | ORAL | 3 refills | Status: DC
Start: 2023-01-09 — End: 2023-04-28

## 2023-01-09 NOTE — Patient Instructions (Addendum)
:  I had a long d/w patient and his daughter about his episode of blurred vision, atrial fibrillation, risk for recurrent stroke/TIAs, personally independently reviewed imaging studies and stroke evaluation results and answered questions.Continue Eliquis (apixaban) daily  for secondary stroke prevention and maintain strict control of hypertension with blood pressure goal below 130/90, diabetes with hemoglobin A1c goal below 6.5% and lipids with LDL cholesterol goal below 70 mg/dL. I also advised the patient to eat a healthy diet with plenty of whole grains, cereals, fruits and vegetables, exercise regularly and maintain ideal body weight . Check screening carotid ultrasound.  Followup in the future with me only as needed or call earlier if necessary.

## 2023-01-09 NOTE — Progress Notes (Signed)
[facility] [address] [place]. [place] [address] [phone]       OFFICE FOLLOW-UP VISIT NOTE  [name] Date of Birth:  1940/08/30 Medical Record Number:  [id number]   Referring MD:  Cadence Fransico Michael PA-c  Reason for Referral:  blurred vision  HPI: Initial visit 09/05/2021 Mr. [name] is a 82 year old pleasant Caucasian male seen today for office consultation visit for blurred vision.  He is accompanied by his daughter.  History is obtained from them and review of electronic medical records and I personally reviewed pertinent available imaging films in PACS.  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  Patient was seen by me at that time and then lost to follow-up since last office video visit 04/2118.  Patient states he was doing well about a week ago when he developed sudden onset of painless right eye blurred vision.  This lasted only about 10 minutes and recovered completely.  He states this is normally the nasal portion of the right eye does not completely affect the whole eye but he could see  from the corner of his eye.  He denies any accompanying headache, slurred speech, diplopia, vertigo, gait or balance problems or extremity weakness he states that a few months ago he was had a complete eye exam by ophthalmologist and spine.  Patient requests follow-up paroxysmal A-fib and states he has been compliant with did not miss any doses he is also on Lipitor 40 mg which is tolerating well without pain.  His blood pressure is well-controlled at home and whitecoat hypertension and lives at home with his wife and he is fully independent in all activities of daily living.  He denies any symptoms of scalp tenderness, jaw claudication, muscle aches and pains or frequent headaches with loss of vision.  Lab work on 10/03/2020 shows LDL  cholesterol 86 mg percent and hemoglobin A1c is 6.1.  No recent brain imaging studies available for review today.  MRI scan 03/14/2018 had shown a tiny right posterior frontal punctate infarct small chronic cerebellar infarct left.  CT angiogram of brain and neck on 03/13/2018 showed mild atherosclerotic changes at both carotid bifurcations 30% right ICA bulb and 50% left vertebral artery origin stenosis. Update 01/23/2022 : He returns for follow-up after last visit 4-1/2 months ago.  He is accompanied by his wife.  He states he is doing well.  He has had no further episodes of blurred vision or vision loss.  He remains on Eliquis which is tolerating well without bleeding or bruising.  His blood pressure is under good control and today it is 140/70.  He is tolerating Lipitor well without muscle aches and pains.  He had lab work done on 09/25/2021 and ESR was normal at 23.  LDL cholesterol was 82 and hemoglobin A1c 6.1.  Triglycerides were elevated slightly at 154.  MRI scan of the brain on 09/25/2021 showed no acute abnormality and only changes of mild small vessel disease.  MR angiogram of the neck and brain both did not show large vessel stenosis or occlusion.  CTA of the coronaries on 09/28/2021 showed calcified aortic valve and he underwent transcatheter aortic valve repair in October and procedure went well.  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  He has no new complaints today. Update 01/09/2023 : He returns for follow-up after last  visit a year ago.  He is accompanied by his daughter.  He states he is doing well.  He has had no recurrent TIA or stroke symptoms.  He remains on Eliquis which she continues to tolerate well without significant bleeding but does get minor bruising.  States his blood pressure remains under good control.  He is tolerating Lipitor well without muscle aches and pains.  Last lipid profile on 01/02/2023 yet showed LDL-cholesterol to be 89 mg  percent.  Hemoglobin A1c was 6.2.  He has no new complaints.  He remains on Lipitor 40 mg daily and I recommend increasing to 80 mg daily.  He and his daughter voiced understanding. ROS:   14 system review of systems is positive for blurred vision , aortic valve repair, skin cancer removal only and all other systems negative  PMH:  Past Medical History:  Diagnosis Date   Angina pectoris (HCC)    Aortic atherosclerosis (HCC)    Aortic stenosis    a.) TTE 03/14/2018: mod AS (MPG 14); b.) TTE 09/13/2019: mild AS (MPG 12.5); c.) TTE 08/16/2021: mod AS (MPG 18.5); d.) R/LHC 09/17/2021: mRA 3, mPA 19, mPCWP 11, LVEDP 15, CO 5.22, CI 2.75; e.) s/p TAVR 11/06/2021   CAD (coronary artery disease) 09/17/2021   a.) R/LHC 09/17/2021: 30% pRCA, 40% dRCA, 50% pLCx, 30% dLCx, 50% pLAD, 50% mLAD, 50% dLAD --> med mgmt   Cardiac murmur    Cervical spondylosis    Cholelithiasis    CRAO (central retinal artery occlusion), 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   CVA (cerebral vascular accident) (HCC) 03/13/2018   a.) MRI brain 03/14/2018 --> acute infarct in the posterior RIGHT frontal lobe; small chronic cerebellar infarct --> Tx'd with TPA with (+) improvement/resolution of symptoms   Diastolic dysfunction    a.) TTE 01/31/2015: EF 55-60%, MAC, G1DD; b.) TTE 03/14/2018: EF 60-65%, mild LAE, sev MAC, sev AoV cal with mod AS (MPG 14) G2DD; c.) TTE 09/13/2019: EF 55-60%, mild LAE, mild-mod MR, mild AS (MPG 12.5), G2DD; d.) TTE 08/16/2021: EF 60-65%, mild LVH, mod MAC, mild MR, mod AS (MPG 18.5), G1DD; e.) TTE 12/05/2021: EF 60-65%, mod MAC, s/p TAVR (well seated/functioning valve), mild MR, G1DD   GERD (gastroesophageal reflux disease)    History of chicken pox    History of measles    Hyperlipidemia    Hypertension    LBBB (left bundle branch block)    Long term current use of anticoagulant    a.) apixaban   Low back pain radiating to left lower extremity    PAF (paroxysmal atrial fibrillation) (HCC)    a.) CHA2DS2VASc = 6  (age x2, HTN, CVA x2, vascular disease history);  b.) rate/rhythm maintained on oral metoprolol succinate; chronically anticoagulated with apixaban   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  Tobacco Use   Smoking status: Former    Current packs/day: 0.00    Average packs/day: 1 pack/day for 20.0 years (20.0 ttl pk-yrs)    Types: Cigarettes    Start date: 03/12/1963    Quit date: 03/12/1983    Years since quitting:  39.8    Passive exposure: Past   Smokeless tobacco: Never  Vaping Use   Vaping status: Never Used  Substance and Sexual Activity   Alcohol use: No   Drug use: No   Sexual activity: Not on file  Other Topics Concern   Not on file  Social History Narrative   Not on file   Social Drivers of Health   Financial Resource Strain: Low Risk  (12/17/2022)   Overall Financial Resource Strain (CARDIA)    Difficulty of Paying Living Expenses: Not very hard  Food Insecurity: No Food Insecurity (12/17/2022)   Hunger Vital Sign    Worried About Running Out of Food in the Last Year: Never true    Ran Out of Food in the Last Year: Never true  Transportation Needs: No Transportation Needs (12/17/2022)   PRAPARE - Administrator, Civil Service (Medical): No    Lack of Transportation (Non-Medical): No  Physical Activity: Sufficiently Active (12/17/2022)   Exercise Vital Sign    Days of Exercise per Week: 5 days    Minutes of Exercise per Session: 40 min  Stress: No Stress Concern Present (12/17/2022)   [employer] of Occupational Health - Occupational Stress Questionnaire    Feeling of Stress : Not  at all  Social Connections: Socially Integrated (12/17/2022)   Social Connection and Isolation Panel [NHANES]    Frequency of Communication with Friends and Family: More than three times a week    Frequency of Social Gatherings with Friends and Family: More than three times a week    Attends Religious Services: More than 4 times per year    Active Member of [employer] or Organizations: Yes    Attends Engineer, structural: More than 4 times per year    Marital Status: Married  Catering manager Violence: Not At Risk (12/17/2022)   Humiliation, Afraid, Rape, and Kick questionnaire    Fear of Current or Ex-Partner: No    Emotionally Abused: No    Physically Abused: No    Sexually Abused: No    Medications:   Current Outpatient Medications on File Prior to Visit  Medication Sig Dispense Refill   amLODipine (NORVASC) 5 MG tablet Take 1 tablet (5 mg total) by mouth daily. 90 tablet 3   amoxicillin (AMOXIL) 500 MG tablet Take 4 tablets (2,000 mg total) by mouth as directed. 1 hour prior to dental work including cleanings 12 tablet 12   apixaban (ELIQUIS) 5 MG TABS tablet TAKE ONE (1) TABLET BY MOUTH TWO TIMES PER DAYTAKE ONE (1) TABLET BY MOUTH TWO TIMES PER DAY, may resume on Friday AM, June 28. 180 tablet 1   atorvastatin (LIPITOR) 40 MG tablet TAKE 1 TABLET BY MOUTH DAILY 90 tablet 4   Cyanocobalamin (B-12) 3000 MCG CAPS Take 3,000 mcg by mouth daily as needed (energy).     ibuprofen (ADVIL) 800 MG tablet Take 1 tablet (800 mg total) by mouth every 8 (eight) hours as needed. 30 tablet 0   metoprolol succinate (TOPROL-XL) 25 MG 24 hr tablet Take 1.5 tablets (37.5 mg total) by mouth daily. 135 tablet 1   omeprazole (PRILOSEC) 40 MG capsule TAKE 1 CAPSULE BY MOUTH DAILY. 30 capsule 0   No current facility-administered medications on file prior to visit.    Allergies:  No Known Allergies  Physical Exam General: Mildly obese elderly Caucasian male, seated, in no evident distress Head:  head normocephalic and atraumatic.   Neck: supple with no carotid or  supraclavicular bruits Cardiovascular: regular rate and rhythm, soft ejection systolic murmur. Musculoskeletal: no deformity Skin:  no rash/petichiae Vascular:  Normal pulses all extremities  Neurologic Exam Mental Status: Awake and fully alert. Oriented to place and time. Recent and remote memory intact. Attention span, concentration and fund of knowledge appropriate. Mood and affect appropriate.  Cranial Nerves: Fundoscopic exam not done. Pupils equal, briskly reactive to light. Extraocular movements full without nystagmus. Visual fields full to confrontation. Hearing intact. Facial sensation intact. Face, tongue, palate moves normally and symmetrically.  Motor: Normal bulk and tone. Normal strength in all tested extremity muscles. Sensory.: intact to touch , pinprick , position and vibratory sensation.  Coordination: Rapid alternating movements normal in all extremities. Finger-to-nose and heel-to-shin performed accurately bilaterally. Gait and Station: Arises from chair without difficulty. Stance is normal. Gait demonstrates normal stride length and balance . Able to heel, toe and tandem walk without  much difficulty.  Reflexes: 1+ and symmetric. Toes downgoing.       ASSESSMENT: 82 year old Caucasian male with transient right eye blurred vision in the nasal field a week ago possibly related to mental artery branch ischemia remote history of transient right central retinal artery occlusion in February 2020 MRI at that time showing silent right frontal infarct.  Vascular risk factors of hypertension, hyperlipidemia, atrial fibrillation, cerebrovascular disease and mild obesity     PLAN:I had a long d/w patient and his daughter about his episode of blurred vision, atrial fibrillation, risk for recurrent stroke/TIAs, personally independently reviewed imaging studies and stroke evaluation results and answered  questions.Continue Eliquis (apixaban) daily  for secondary stroke prevention and maintain strict control of hypertension with blood pressure goal below 130/90, diabetes with hemoglobin A1c goal below 6.5% and lipids with LDL cholesterol goal below 70 mg/dL. I also advised the patient to eat a healthy diet with plenty of whole grains, cereals, fruits and vegetables, exercise regularly and maintain ideal body weight .  I recommend we increase the dose of Lipitor to 80 mg as his LDL is yet suboptimally controlled.  Check screening carotid ultrasound.  Followup in the future with me only as needed or call earlier if necessary. Greater than 50% time during this 35-minute visit was spent on counseling and coordination of care about his transient blurred vision and discussion about evaluation and treatment and answering questions.  Delia Heady, MD Note: This document was prepared with digital dictation and possible smart phrase technology. Any transcriptional errors that result from this process are unintentional.

## 2023-01-10 NOTE — Telephone Encounter (Signed)
Pt was seen on 11/15/22 and recall due 05/14/23 with [facility]

## 2023-01-16 NOTE — Telephone Encounter (Signed)
Pt is scheduled on 03/18/23 to see Dr. Mariah Milling

## 2023-02-03 ENCOUNTER — Other Ambulatory Visit: Payer: Self-pay | Admitting: Cardiovascular Disease

## 2023-02-13 ENCOUNTER — Other Ambulatory Visit: Payer: Self-pay | Admitting: Neurology

## 2023-02-13 DIAGNOSIS — H34231 Retinal artery branch occlusion, right eye: Secondary | ICD-10-CM

## 2023-02-13 DIAGNOSIS — I482 Chronic atrial fibrillation, unspecified: Secondary | ICD-10-CM

## 2023-02-13 DIAGNOSIS — G459 Transient cerebral ischemic attack, unspecified: Secondary | ICD-10-CM

## 2023-02-13 DIAGNOSIS — Z952 Presence of prosthetic heart valve: Secondary | ICD-10-CM

## 2023-02-13 DIAGNOSIS — H538 Other visual disturbances: Secondary | ICD-10-CM

## 2023-02-13 DIAGNOSIS — I480 Paroxysmal atrial fibrillation: Secondary | ICD-10-CM

## 2023-02-13 DIAGNOSIS — H5360 Unspecified night blindness: Secondary | ICD-10-CM

## 2023-02-18 DIAGNOSIS — L814 Other melanin hyperpigmentation: Secondary | ICD-10-CM | POA: Diagnosis not present

## 2023-02-18 DIAGNOSIS — L821 Other seborrheic keratosis: Secondary | ICD-10-CM | POA: Diagnosis not present

## 2023-02-18 DIAGNOSIS — D229 Melanocytic nevi, unspecified: Secondary | ICD-10-CM | POA: Diagnosis not present

## 2023-02-18 DIAGNOSIS — Z85828 Personal history of other malignant neoplasm of skin: Secondary | ICD-10-CM | POA: Diagnosis not present

## 2023-02-18 DIAGNOSIS — L570 Actinic keratosis: Secondary | ICD-10-CM | POA: Diagnosis not present

## 2023-02-19 ENCOUNTER — Ambulatory Visit (HOSPITAL_COMMUNITY): Payer: PPO

## 2023-02-19 ENCOUNTER — Other Ambulatory Visit: Payer: Self-pay | Admitting: Neurology

## 2023-02-19 DIAGNOSIS — H538 Other visual disturbances: Secondary | ICD-10-CM

## 2023-02-19 DIAGNOSIS — I480 Paroxysmal atrial fibrillation: Secondary | ICD-10-CM

## 2023-02-19 DIAGNOSIS — I482 Chronic atrial fibrillation, unspecified: Secondary | ICD-10-CM

## 2023-02-19 DIAGNOSIS — G459 Transient cerebral ischemic attack, unspecified: Secondary | ICD-10-CM

## 2023-02-19 DIAGNOSIS — H5360 Unspecified night blindness: Secondary | ICD-10-CM

## 2023-02-19 DIAGNOSIS — Z952 Presence of prosthetic heart valve: Secondary | ICD-10-CM

## 2023-02-19 DIAGNOSIS — H34231 Retinal artery branch occlusion, right eye: Secondary | ICD-10-CM

## 2023-03-03 ENCOUNTER — Ambulatory Visit: Payer: PPO | Attending: Neurology

## 2023-03-03 DIAGNOSIS — G459 Transient cerebral ischemic attack, unspecified: Secondary | ICD-10-CM

## 2023-03-03 DIAGNOSIS — H538 Other visual disturbances: Secondary | ICD-10-CM

## 2023-03-03 DIAGNOSIS — H34231 Retinal artery branch occlusion, right eye: Secondary | ICD-10-CM

## 2023-03-03 DIAGNOSIS — H5360 Unspecified night blindness: Secondary | ICD-10-CM

## 2023-03-06 ENCOUNTER — Encounter: Payer: Self-pay | Admitting: Neurology

## 2023-03-18 ENCOUNTER — Ambulatory Visit: Payer: PPO | Admitting: Cardiovascular Disease

## 2023-03-24 ENCOUNTER — Ambulatory Visit: Payer: PPO | Admitting: Physician Assistant

## 2023-04-08 ENCOUNTER — Ambulatory Visit: Payer: PPO | Admitting: Student

## 2023-04-15 ENCOUNTER — Other Ambulatory Visit: Payer: Self-pay | Admitting: Cardiovascular Disease

## 2023-04-25 ENCOUNTER — Ambulatory Visit: Attending: Medical | Admitting: Medical

## 2023-04-25 ENCOUNTER — Encounter: Payer: Self-pay | Admitting: Medical

## 2023-04-25 VITALS — BP 144/70 | HR 64 | Ht 65.0 in | Wt 180.4 lb

## 2023-04-25 DIAGNOSIS — Z8673 Personal history of transient ischemic attack (TIA), and cerebral infarction without residual deficits: Secondary | ICD-10-CM

## 2023-04-25 DIAGNOSIS — I6523 Occlusion and stenosis of bilateral carotid arteries: Secondary | ICD-10-CM | POA: Diagnosis not present

## 2023-04-25 DIAGNOSIS — Z952 Presence of prosthetic heart valve: Secondary | ICD-10-CM | POA: Diagnosis not present

## 2023-04-25 DIAGNOSIS — E782 Mixed hyperlipidemia: Secondary | ICD-10-CM

## 2023-04-25 DIAGNOSIS — I10 Essential (primary) hypertension: Secondary | ICD-10-CM

## 2023-04-25 DIAGNOSIS — I350 Nonrheumatic aortic (valve) stenosis: Secondary | ICD-10-CM | POA: Diagnosis not present

## 2023-04-25 DIAGNOSIS — I480 Paroxysmal atrial fibrillation: Secondary | ICD-10-CM

## 2023-04-25 DIAGNOSIS — Z79899 Other long term (current) drug therapy: Secondary | ICD-10-CM

## 2023-04-25 MED ORDER — AMLODIPINE BESYLATE 10 MG PO TABS: 10.0000 mg | ORAL_TABLET | Freq: Every day | ORAL | 3 refills | Status: DC

## 2023-04-25 NOTE — Progress Notes (Signed)
 Cardiology Office Note:  .   Date:  04/25/2023  ID:  Jon Mejia, DOB 19-Jul-1940, MRN [id number] PCP: Malva Limes, MD  [facility] Providers Cardiologist:  Julien Nordmann, MD {   History of Present Illness: .   Jon Mejia is a 83 y.o. male with a h/o CVA (2020), paroxysmal Afib on Eliquis, PACs, former tobacco use, hypertension, hyperlipidemia, low-flow low gradient aortic stenosis status post TAVR in October 2023 who presents for 1 year follow-up for TAVR and PAF.  Patient had a stroke in February 2020.  He was found to have A-fib and he was started on Eliquis.  Echocardiogram in February 2023 showed normal LVEF, mild LVH, mild MR, low-flow low gradient severe aortic stenosis.  Right and left heart cath showed moderate nonobstructive disease in the LAD, circumflex and RCA.  He was evaluated by the multidisciplinary valve team and underwent successful TAVR on October 2023.  Postoperative echo showed EF of 60%, normally functioning TAVR with a mean gradient of 13 mmHg and trivial PVL as well as severe MAC.  He was continued on Eliquis monotherapy.  EKG showed new left bundle and he was discharged with a heart monitor which only showed SVT but no HAVB.   Patient was last seen by Carlean Jews in October 2024 and was overall stable from a cardiac perspective.  Echo at the time showed EF of 55% with normally functioning TAVR with a mean gradient of 10 mmHg.  Today, the patient reports he is overall Ok. He reports occasional tiredness which is feels is due to cold weather. He plans on being more active since the weather is warming up. He denies chest pain. He has rare SOB. No lower leg edema, lightheadedness, dizziness, palpitations. BP is a little high, it has been similar at home.   Studies Reviewed: Marland Kitchen   EKG Interpretation Date/Time:  Friday April 25 2023 07:51:07 EDT Ventricular Rate:  64 PR Interval:  170 QRS Duration:  80 QT Interval:  396 QTC Calculation: 408 R  Axis:   25  Text Interpretation: Normal sinus rhythm Normal ECG When compared with ECG of 22-Jul-2022 09:41, No significant change was found Confirmed by Fransico Michael, Emeli Goguen ([id number]) on 04/25/2023 8:05:16 AM    Echo 10/2022 1. Left ventricular ejection fraction, by estimation, is 55 to 60%. The  left ventricle has normal function. The left ventricle has no regional  wall motion abnormalities. Left ventricular diastolic parameters are  consistent with Grade II diastolic  dysfunction (pseudonormalization). The average left ventricular global  longitudinal strain is -19.7 %. The global longitudinal strain is normal.   2. Right ventricular systolic function is normal. The right ventricular  size is normal. Tricuspid regurgitation signal is inadequate for assessing  PA pressure.   3. Left atrial size was mild to moderately dilated.   4. The mitral valve is normal in structure. Mild mitral valve  regurgitation. No evidence of mitral stenosis. Moderate mitral annular  calcification.   5. Bioprosthetic aortic valve s/p TAVR with 26 mm Ultra THV. Mean  gradient 10 mmHg, trivial perivalvular leakage. EOA 2.3 cm^2.   6. The inferior vena cava is normal in size with greater than 50%  respiratory variability, suggesting right atrial pressure of 3 mmHg.   Heart monitor 11/2021 Patch Wear Time:  14 days and 0 hours (2023-10-11T08:46:20-0400 to 2023-10-25T08:46:20-0400)   Sinus rhythm. (Patient had a min HR of 49 bpm, max HR of 164 bpm, and avg HR of 67 bpm). First Degree  AV Block was present.  Bundle Branch Block/IVCD was present.  96 Supraventricular Tachycardia runs occurred, the longest lasting 12.8 secs.  Premature atrial contractions. (2.2%, [id number]),  Premature ventricular contractions (1.2%, 15308), VE Couplets were rare (<1.0%, 2891), and VE Triplets were rare (<1.0%, 690).    R/L heart cath 08/2021   Prox RCA lesion is 30% stenosed.   Dist RCA lesion is 40% stenosed.   Prox Cx lesion is 50%  stenosed.   Dist Cx lesion is 30% stenosed.   Prox LAD lesion is 50% stenosed.   Mid LAD lesion is 50% stenosed.   Dist LAD lesion is 50% stenosed.   Moderate non-obstructive disease in the LAD, Circumflex and RCA Severe low flow/low gradient aortic stenosis by echo Normal right and left heart pressures. (PCWP 11, LVEDP 12-15)   Recommendations: Continue workup for TAVR. Medical management of non-obstructive CAD.      Physical Exam:   VS:  BP (!) 144/70 (BP Location: Left Arm, Patient Position: Sitting, Cuff Size: Normal)   Pulse 64   Ht 5\' 5"  (1.651 m)   Wt 180 lb 6 oz (81.8 kg)   SpO2 96%   BMI 30.02 kg/m    Wt Readings from Last 3 Encounters:  04/25/23 180 lb 6 oz (81.8 kg)  01/09/23 183 lb 9.6 oz (83.3 kg)  01/03/23 184 lb (83.5 kg)    GEN: Well nourished, well developed in no acute distress NECK: No JVD; No carotid bruits CARDIAC: RRR, + murmur, no rubs, gallops RESPIRATORY:  Clear to auscultation without rales, wheezing or rhonchi  ABDOMEN: Soft, non-tender, non-distended EXTREMITIES:  No edema; No deformity   ASSESSMENT AND PLAN: .    Paroxysmal A-fib Patient is in normal sinus rhythm on EKG today. Patient reports tiredness, but feels this is due to the cold weather.  He feels this will improve as activity increases with warmer weather.  He denies chest pain or shortness of breath.  He feels symptoms are overall much better since he has had TAVR operation.  He denies bleeding issues.  I will check a CBC today. Continue Eliquis 5 mg twice daily for stroke prophylaxis.  Continue Toprol XL 37.5 mg daily for rate control.    AS s/p TAVR 10/2021 Echo in October 2024 showed EF of 55% with normally functioning TAVR with a mean gradient of 10 mmHg and trivial PVL and mild MR.  Will continue Eliquis 5 mg twice daily.  He will need lifelong dental SBE with amoxicillin.  Patient is euvolemic on exam.  I will update echocardiogram in 6 months.  Hypertension Blood pressure mildly  elevated today.  He says it has been similar at home.  I will increase amlodipine to 10 mg daily.  Continue Toprol 37.5 mg daily.  Low-salt diet recommended.  Carotid artery disease Recent carotid ultrasound showed 1 to 39% stenosis bilaterally.  Continue Lipitor 80 mg daily and Eliquis.   Hyperlipidemia LDL 89, triglycerides 92, HDL 39, total cholesterol 161.  Continue Lipitor 80 mg daily.  History of CVA Continue Eliquis and Lipitor.    Dispo: Follow-up in 6 months  Signed, Mali Eppard David Stall, PA-C

## 2023-04-25 NOTE — Patient Instructions (Signed)
 Medication Instructions:  Your physician recommends the following medication changes.  INCREASE: Amlodipine to 10 mg by mouth daily   *If you need a refill on your cardiac medications before your next appointment, please call your pharmacy*  Lab Work: Your provider would like for you to have following labs drawn today (CBC).    Testing/Procedures: Your physician has requested that you have an echocardiogram in September, 2025. Echocardiography is a painless test that uses sound waves to create images of your heart. It provides your doctor with information about the size and shape of your heart and how well your heart's chambers and valves are working.   You may receive an ultrasound enhancing agent through an IV if needed to better visualize your heart during the echo. This procedure takes approximately one hour.  There are no restrictions for this procedure.  This will take place at [address] [facility]) [address], [place] [address]  Please note: We ask at that you not bring children with you during ultrasound (echo/ vascular) testing. Due to room size and safety concerns, children are not allowed in the ultrasound rooms during exams. Our front office staff cannot provide observation of children in our lobby area while testing is being conducted. An adult accompanying a patient to their appointment will only be allowed in the ultrasound room at the discretion of the ultrasound technician under special circumstances. We apologize for any inconvenience.   Follow-Up: At [facility], you and your health needs are our priority.  As part of our continuing mission to provide you with exceptional heart care, our providers are all part of one team.  This team includes your primary Cardiologist (physician) and Advanced Practice Providers or APPs (Physician Assistants and Nurse Practitioners) who all work together to provide you with the care you need, when you need it.  Your  next appointment:   6 month(s)  Provider:   You may see Julien Nordmann, MD or one of the following Advanced Practice Providers on your designated Care Team:   Nicolasa Ducking, NP Ames Dura, PA-C Eula Listen, PA-C Cadence [place], PA-C Charlsie Quest, NP Carlos Levering, NP    We recommend signing up for the patient portal called "MyChart".  Sign up information is provided on this After Visit Summary.  MyChart is used to connect with patients for Virtual Visits (Telemedicine).  Patients are able to view lab/test results, encounter notes, upcoming appointments, etc.  Non-urgent messages can be sent to your provider as well.   To learn more about what you can do with MyChart, go to [email or web address].

## 2023-04-26 LAB — CBC
Hematocrit: 43 % (ref 37.5–51.0)
Hemoglobin: 14.1 g/dL (ref 13.0–17.7)
MCH: 30.1 pg (ref 26.6–33.0)
MCHC: 32.8 g/dL (ref 31.5–35.7)
MCV: 92 fL (ref 79–97)
Platelets: 230 10*3/uL (ref 150–450)
RBC: 4.68 x10E6/uL (ref 4.14–5.80)
RDW: 13.9 % (ref 11.6–15.4)
WBC: 8 10*3/uL (ref 3.4–10.8)

## 2023-04-28 ENCOUNTER — Other Ambulatory Visit: Payer: Self-pay | Admitting: Neurology

## 2023-06-02 ENCOUNTER — Other Ambulatory Visit: Payer: Self-pay | Admitting: Cardiovascular Disease

## 2023-07-07 ENCOUNTER — Ambulatory Visit (INDEPENDENT_AMBULATORY_CARE_PROVIDER_SITE_OTHER): Payer: Self-pay | Admitting: Family Medicine

## 2023-07-07 ENCOUNTER — Encounter: Payer: Self-pay | Admitting: Family Medicine

## 2023-07-07 VITALS — BP 138/79 | HR 100 | Resp 16 | Ht 65.0 in | Wt 172.1 lb

## 2023-07-07 DIAGNOSIS — I10 Essential (primary) hypertension: Secondary | ICD-10-CM | POA: Diagnosis not present

## 2023-07-07 DIAGNOSIS — E7800 Pure hypercholesterolemia, unspecified: Secondary | ICD-10-CM | POA: Diagnosis not present

## 2023-07-07 DIAGNOSIS — R5383 Other fatigue: Secondary | ICD-10-CM

## 2023-07-07 DIAGNOSIS — R0609 Other forms of dyspnea: Secondary | ICD-10-CM

## 2023-07-07 DIAGNOSIS — I4891 Unspecified atrial fibrillation: Secondary | ICD-10-CM

## 2023-07-07 NOTE — Patient Instructions (Signed)
 Jon Mejia  Please review the attached list of medications and notify my office if there are any errors.   . Please bring all of your medications to every appointment so we can make sure that our medication list is the same as yours.

## 2023-07-07 NOTE — Progress Notes (Signed)
 Established patient visit   Patient: Jon Mejia   DOB: May 24, 1940   83 y.o. Male  MRN: [id number] Visit Date: 07/07/2023  Today's healthcare provider: Jeralene Mom, MD   Chief Complaint  Patient presents with   Medical Management of Chronic Issues    HTN follow-up. Patient reports Bp reading at home yesterday was 110/62 P:98 and today it was 128/71.   Subjective    Discussed the use of AI scribe software for clinical note transcription with the patient, who gave verbal consent to proceed.  History of Present Illness   Jon Mejia is an 83 year old male with hypertension and atrial fibrillation who presents for routine follow up and report shortness of breath.  He has been experiencing increasing shortness of breath since his last visit in February. The shortness of breath has progressively worsened and primarily occurs during physical exertion, such as walking or other activities, but resolves when he is at rest. No chest pain, heart flutters, or swelling in his feet or ankles.  He has a history of atrial fibrillation and is currently taking Eliquis . No unusual bleeding or bruising, although he occasionally notices bruises after minor trauma. He underwent aortic valve replacement in 10/2021.  His blood pressure readings at home have been fluctuating, with recent measurements of 128/71 and 110/62. He is currently taking amlodipine  and metoprolol  for hypertension and reports no issues with these medications.  He also reports feeling very fatigued for a few months. He has difficulty staying asleep, often waking up after an hour of sleep, but this has been a long-standing issue and has not worsened recently.     Lab Results  Component Value Date   NA 141 01/02/2023   K 4.9 01/02/2023   CREATININE 1.22 01/02/2023   EGFR 60 01/02/2023   GLUCOSE 102 (H) 01/02/2023   Lab Results  Component Value Date   CHOL 145 01/02/2023   HDL 39 (L) 01/02/2023   LDLCALC 89  01/02/2023   TRIG 92 01/02/2023   CHOLHDL 3.7 01/02/2023     Medications: Outpatient Medications Prior to Visit  Medication Sig   amLODipine  (NORVASC ) 10 MG tablet Take 1 tablet (10 mg total) by mouth daily.   apixaban  (ELIQUIS ) 5 MG TABS tablet TAKE ONE (1) TABLET BY MOUTH TWO TIMES PER DAYTAKE ONE (1) TABLET BY MOUTH TWO TIMES PER DAY, may resume on Friday AM, June 28.   atorvastatin  (LIPITOR) 80 MG tablet TAKE ONE TABLET BY MOUTH ONCE DAILY   Cyanocobalamin (B-12) 3000 MCG CAPS Take 3,000 mcg by mouth daily as needed (energy).   ibuprofen  (ADVIL ) 800 MG tablet Take 1 tablet (800 mg total) by mouth every 8 (eight) hours as needed.   metoprolol  succinate (TOPROL -XL) 25 MG 24 hr tablet TAKE ONE AND ONE-HALF TABLETS DAILY   amoxicillin  (AMOXIL ) 500 MG tablet Take 4 tablets (2,000 mg total) by mouth as directed. 1 hour prior to dental work including cleanings (Patient not taking: Reported on 07/07/2023)   No facility-administered medications prior to visit.   Review of Systems  Constitutional:  Positive for fatigue. Negative for appetite change, chills and fever.  Respiratory:  Positive for shortness of breath. Negative for chest tightness and wheezing.   Cardiovascular:  Negative for chest pain and palpitations.  Gastrointestinal:  Negative for abdominal pain, nausea and vomiting.       Objective    BP 138/79 (BP Location: Left Arm, Patient Position: Sitting, Cuff Size: Normal)   Pulse  100   Resp 16   Ht 5\' 5"  (1.651 m)   Wt 172 lb 1.6 oz (78.1 kg)   SpO2 98%   BMI 28.64 kg/m   Physical Exam   General: Appearance:    Well developed, well nourished male in no acute distress  Eyes:    PERRL, conjunctiva/corneas clear, EOM's intact       Lungs:     Clear to auscultation bilaterally, respirations unlabored  Heart:    Tachycardic. Regular rhythm with occasional missed beats   MS:   All extremities are intact.    Neurologic:   Awake, alert, oriented x 3. No apparent focal  neurological defect.        Assessment & Plan       Shortness of Breath Worsening dyspnea since February, exacerbated by exertion. Differential includes cardiac issues, anemia, thyroid  dysfunction, renal issues. - Order BNP to assess cardiac function. - Order general blood panel for anemia, thyroid  dysfunction, renal issues. - Consider echocardiogram if BNP elevated.  Atrial Fibrillation On Eliquis  with no unusual bleeding or bruising. Potential recurrence contributing to dyspnea. Prescription management issue with Eliquis  prescribed by another provider. - Address prescription management with Doctor Espiridion Heft.  Hypertension Blood pressure well-controlled with amlodipine  and metoprolol .   Fatigue Check cbc, met c and TSH. He has longstanding issue staying asleep through night which has not changed recently.     Return in about 6 months (around 01/06/2024) for Yearly Physical.     Jeralene Mom, MD  [facility] [phone] (phone) [phone] (fax)  [facility]

## 2023-07-08 LAB — COMPREHENSIVE METABOLIC PANEL WITH GFR
ALT: 58 IU/L — ABNORMAL HIGH (ref 0–44)
AST: 97 IU/L — ABNORMAL HIGH (ref 0–40)
Albumin: 4 g/dL (ref 3.8–4.8)
Alkaline Phosphatase: 228 IU/L — ABNORMAL HIGH (ref 44–121)
BUN/Creatinine Ratio: 15 (ref 10–24)
BUN: 15 mg/dL (ref 8–27)
Bilirubin Total: 0.7 mg/dL (ref 0.0–1.2)
CO2: 18 mmol/L — ABNORMAL LOW (ref 20–29)
Calcium: 9.2 mg/dL (ref 8.6–10.2)
Chloride: 102 mmol/L (ref 96–106)
Creatinine, Ser: 1 mg/dL (ref 0.76–1.27)
Globulin, Total: 2.8 g/dL (ref 1.5–4.5)
Glucose: 120 mg/dL — ABNORMAL HIGH (ref 70–99)
Potassium: 4.1 mmol/L (ref 3.5–5.2)
Sodium: 136 mmol/L (ref 134–144)
Total Protein: 6.8 g/dL (ref 6.0–8.5)
eGFR: 76 mL/min/{1.73_m2} (ref >59)

## 2023-07-08 LAB — CBC
Hematocrit: 34.6 % — ABNORMAL LOW (ref 37.5–51.0)
Hemoglobin: 11.5 g/dL — ABNORMAL LOW (ref 13.0–17.7)
MCH: 28.9 pg (ref 26.6–33.0)
MCHC: 33.2 g/dL (ref 31.5–35.7)
MCV: 87 fL (ref 79–97)
Platelets: 332 10*3/uL (ref 150–450)
RBC: 3.98 x10E6/uL — ABNORMAL LOW (ref 4.14–5.80)
RDW: 12.7 % (ref 11.6–15.4)
WBC: 13 10*3/uL — ABNORMAL HIGH (ref 3.4–10.8)

## 2023-07-08 LAB — TSH: TSH: 2.89 u[IU]/mL (ref 0.450–4.500)

## 2023-07-08 LAB — BRAIN NATRIURETIC PEPTIDE: BNP: 39.6 pg/mL (ref 0.0–100.0)

## 2023-07-09 ENCOUNTER — Telehealth: Payer: Self-pay | Admitting: Medical

## 2023-07-09 ENCOUNTER — Encounter: Payer: Self-pay | Admitting: Family Medicine

## 2023-07-09 NOTE — Telephone Encounter (Signed)
 Spoke with pt's wife who has concerns.  Pt had blood work at his PCP on 07/08/2023, saw results in MyChart.  She wants to know if Echo needs to be moved up from September and has concern.  She states pt has been on Eliquis  for quite some time and over the past 2 weeks, pt has had decrease in appetite with occasional nausea/vomiting, pt complaining of tenderness in his midsection and states that his stools are dark, almost black in color.  Wife also states that his shortness of breath with exertion has increased slightly during this time also.  I explained that I would forward her concerns to Cadence Furth, NP-C, and I will also send to Dr. Shann Darnel and Pharmacy for further instructions on what the next step should be.

## 2023-07-09 NOTE — Telephone Encounter (Signed)
 Pt's spouse requesting cb to discuss lab results, wants to know if Echo should be moved up based off pt's labs results and symptoms

## 2023-07-10 ENCOUNTER — Emergency Department
Admission: EM | Admit: 2023-07-10 | Discharge: 2023-07-10 | Disposition: A | Discharge status: Home / Self Care | Attending: Emergency Medicine | Admitting: Emergency Medicine

## 2023-07-10 ENCOUNTER — Ambulatory Visit: Payer: Self-pay

## 2023-07-10 ENCOUNTER — Other Ambulatory Visit: Payer: Self-pay

## 2023-07-10 ENCOUNTER — Emergency Department

## 2023-07-10 ENCOUNTER — Telehealth: Payer: Self-pay

## 2023-07-10 DIAGNOSIS — D649 Anemia, unspecified: Secondary | ICD-10-CM | POA: Diagnosis not present

## 2023-07-10 DIAGNOSIS — R1011 Right upper quadrant pain: Secondary | ICD-10-CM | POA: Diagnosis not present

## 2023-07-10 DIAGNOSIS — C786 Secondary malignant neoplasm of retroperitoneum and peritoneum: Secondary | ICD-10-CM

## 2023-07-10 DIAGNOSIS — Z7901 Long term (current) use of anticoagulants: Secondary | ICD-10-CM | POA: Diagnosis not present

## 2023-07-10 DIAGNOSIS — R935 Abnormal findings on diagnostic imaging of other abdominal regions, including retroperitoneum: Secondary | ICD-10-CM | POA: Insufficient documentation

## 2023-07-10 DIAGNOSIS — I700 Atherosclerosis of aorta: Secondary | ICD-10-CM | POA: Diagnosis not present

## 2023-07-10 DIAGNOSIS — D72829 Elevated white blood cell count, unspecified: Secondary | ICD-10-CM | POA: Insufficient documentation

## 2023-07-10 DIAGNOSIS — R160 Hepatomegaly, not elsewhere classified: Secondary | ICD-10-CM

## 2023-07-10 DIAGNOSIS — R109 Unspecified abdominal pain: Secondary | ICD-10-CM | POA: Diagnosis present

## 2023-07-10 DIAGNOSIS — I10 Essential (primary) hypertension: Secondary | ICD-10-CM | POA: Diagnosis not present

## 2023-07-10 DIAGNOSIS — R0602 Shortness of breath: Secondary | ICD-10-CM | POA: Insufficient documentation

## 2023-07-10 DIAGNOSIS — R188 Other ascites: Secondary | ICD-10-CM | POA: Diagnosis not present

## 2023-07-10 DIAGNOSIS — K769 Liver disease, unspecified: Secondary | ICD-10-CM | POA: Diagnosis not present

## 2023-07-10 DIAGNOSIS — K7689 Other specified diseases of liver: Secondary | ICD-10-CM | POA: Diagnosis not present

## 2023-07-10 LAB — COMPREHENSIVE METABOLIC PANEL WITH GFR
ALT: 52 U/L — ABNORMAL HIGH (ref 0–44)
AST: 70 U/L — ABNORMAL HIGH (ref 15–41)
Albumin: 3.2 g/dL — ABNORMAL LOW (ref 3.5–5.0)
Alkaline Phosphatase: 162 U/L — ABNORMAL HIGH (ref 38–126)
Anion gap: 11 (ref 5–15)
BUN: 15 mg/dL (ref 8–23)
CO2: 22 mmol/L (ref 22–32)
Calcium: 8.9 mg/dL (ref 8.9–10.3)
Chloride: 104 mmol/L (ref 98–111)
Creatinine, Ser: 1.06 mg/dL (ref 0.61–1.24)
GFR, Estimated: >60 mL/min (ref >60)
Glucose, Bld: 151 mg/dL — ABNORMAL HIGH (ref 70–99)
Potassium: 3.9 mmol/L (ref 3.5–5.1)
Sodium: 137 mmol/L (ref 135–145)
Total Bilirubin: 0.9 mg/dL (ref 0.0–1.2)
Total Protein: 7.1 g/dL (ref 6.5–8.1)

## 2023-07-10 LAB — CBC WITH DIFFERENTIAL/PLATELET
Abs Immature Granulocytes: 0.05 10*3/uL (ref 0.00–0.07)
Basophils Absolute: 0.1 10*3/uL (ref 0.0–0.1)
Basophils Relative: 1 %
Eosinophils Absolute: 0.1 10*3/uL (ref 0.0–0.5)
Eosinophils Relative: 1 %
HCT: 33.8 % — ABNORMAL LOW (ref 39.0–52.0)
Hemoglobin: 11 g/dL — ABNORMAL LOW (ref 13.0–17.0)
Immature Granulocytes: 0 %
Lymphocytes Relative: 12 %
Lymphs Abs: 1.5 10*3/uL (ref 0.7–4.0)
MCH: 28.7 pg (ref 26.0–34.0)
MCHC: 32.5 g/dL (ref 30.0–36.0)
MCV: 88.3 fL (ref 80.0–100.0)
Monocytes Absolute: 1.2 10*3/uL — ABNORMAL HIGH (ref 0.1–1.0)
Monocytes Relative: 9 %
Neutro Abs: 9.7 10*3/uL — ABNORMAL HIGH (ref 1.7–7.7)
Neutrophils Relative %: 77 %
Platelets: 334 10*3/uL (ref 150–400)
RBC: 3.83 MIL/uL — ABNORMAL LOW (ref 4.22–5.81)
RDW: 13.6 % (ref 11.5–15.5)
WBC: 12.6 10*3/uL — ABNORMAL HIGH (ref 4.0–10.5)
nRBC: 0 % (ref 0.0–0.2)

## 2023-07-10 LAB — LIPASE, BLOOD: Lipase: 33 U/L (ref 11–51)

## 2023-07-10 LAB — PROTIME-INR
INR: 1.4 — ABNORMAL HIGH (ref 0.8–1.2)
Prothrombin Time: 17.6 s — ABNORMAL HIGH (ref 11.4–15.2)

## 2023-07-10 LAB — BRAIN NATRIURETIC PEPTIDE: B Natriuretic Peptide: 52.3 pg/mL (ref 0.0–100.0)

## 2023-07-10 LAB — APTT: aPTT: 42 s — ABNORMAL HIGH (ref 24–36)

## 2023-07-10 MED ORDER — ONDANSETRON HCL 4 MG PO TABS: 4.0000 mg | ORAL_TABLET | Freq: Three times a day (TID) | ORAL | 0 refills | Status: DC | PRN

## 2023-07-10 MED ORDER — IOHEXOL 300 MG/ML  SOLN
100.0000 mL | Freq: Once | INTRAMUSCULAR | Status: AC | PRN
  Administered 2023-07-10: 100 mL via INTRAVENOUS

## 2023-07-10 MED ORDER — SENNOSIDES-DOCUSATE SODIUM 8.6-50 MG PO TABS: 1.0000 | ORAL_TABLET | Freq: Every day | ORAL | 0 refills | Status: DC

## 2023-07-10 MED ORDER — OXYCODONE HCL 5 MG PO TABS: 5.0000 mg | ORAL_TABLET | Freq: Three times a day (TID) | ORAL | 0 refills | Status: DC | PRN

## 2023-07-10 NOTE — Telephone Encounter (Signed)
 BNP, the cardiac function test , was normal. No need for echo. But elevated liver enzymes and drop of hemoglobin concerning. Agree that he needs to go to ER since he is having signs of GI bleeding.

## 2023-07-10 NOTE — ED Triage Notes (Signed)
 Pt to ED via POV from home. Pt reports ongoing abd issues since March. Pt reports increased bloating with that week having sharp pain on both sides. Daughter reports liver enzymes are elevated. Pt denies urinary issues. Pt reports N/V on Tuesday. Pt reports intermittent constipation and diarrhea. Pt is on Eliquis  for afib.   Pt with hx of cholecystectomy.

## 2023-07-10 NOTE — Discharge Instructions (Signed)
 I have sent medication to your pharmacy for pain, nausea, and constipation.  Please take these as needed.  The oncology team should call you either today or tomorrow for your outpatient appointment early next week.  If you have not heard anything by tomorrow afternoon, please give them a call.  We are going to continue evaluation for possible liver cancer from an outpatient setting.  Please return for any severe or worsening symptoms.

## 2023-07-10 NOTE — Telephone Encounter (Signed)
 FYI Only or Action Required?: FYI only for provider  Patient was last seen in primary care on 07/07/2023 by Lamon Pillow, MD. Called Nurse Triage reporting No chief complaint on file.. Symptoms began several weeks ago. Interventions attempted: Nothing. Symptoms are: gradually worsening.  Triage Disposition: Go to ED Now (Notify PCP)  Patient/caregiver understands and will follow disposition?: Yes   Copied from CRM [id number]. Topic: Clinical - Red Word Triage >> Jul 10, 2023  8:47 AM Dorthula Gavel H wrote: Red Word that prompted transfer to Nurse Triage: pt's wife called in, in regards to the pt's bloodwork but started stating that the pt has been having shortness of breathe and been really sluggish. Pt also has heart issues  ----------------------------------------------------------------------- From previous Reason for Contact - Scheduling: Patient/patient representative is calling to schedule an appointment. Refer to attachments for appointment information. Reason for Disposition  Black or tarry bowel movements  (Exception: Chronic-unchanged black-grey BMs AND is taking iron pills or Pepto-Bismol.)    Reports a weight loss of 4 pounds and vomiting. Loss of appetite.  Answer Assessment - Initial Assessment Questions 1. LOCATION: Where does it hurt?      Midsection  2. RADIATION: Does the pain shoot anywhere else? (e.g., chest, back)     No  3. ONSET: When did the pain begin? (Minutes, hours or days ago)      Several Days ago  4. SUDDEN: Gradual or sudden onset?     Sudden  5. PATTERN Does the pain come and go, or is it constant?    - If it comes and goes: How long does it last? Do you have pain now?     (Note: Comes and goes means the pain is intermittent. It goes away completely between bouts.)    - If constant: Is it getting better, staying the same, or getting worse?      (Note: Constant means the pain never goes away completely; most serious pain is constant and gets  worse.)      Intermittent  6. SEVERITY: How bad is the pain?  (e.g., Scale 1-10; mild, moderate, or severe)    - MILD (1-3): Doesn't interfere with normal activities, abdomen soft and not tender to touch.     - MODERATE (4-7): Interferes with normal activities or awakens from sleep, abdomen tender to touch.     - SEVERE (8-10): Excruciating pain, doubled over, unable to do any normal activities.       6  7. RECURRENT SYMPTOM: Have you ever had this type of stomach pain before? If Yes, ask: When was the last time? and What happened that time?      No  8. CAUSE: What do you think is causing the stomach pain?     Unsure  9. RELIEVING/AGGRAVATING FACTORS: What makes it better or worse? (e.g., antacids, bending or twisting motion, bowel movement)     Bowel Movements provide relief  10. OTHER SYMPTOMS: Do you have any other symptoms? (e.g., back pain, diarrhea, fever, urination pain, vomiting)       Fatigue  Protocols used: Abdominal Pain - Male-A-AH

## 2023-07-10 NOTE — Telephone Encounter (Signed)
 Clearance to hold Eliquis  sent to Dr. Gollan and PA Luetta Sago. Pending biopsy of liver or carcinomatosis.

## 2023-07-10 NOTE — Telephone Encounter (Signed)
Patient currently at ED

## 2023-07-10 NOTE — ED Provider Notes (Addendum)
 [facility] Provider Note    Event Date/Time   First MD Initiated Contact with Patient 07/10/23 1133     (approximate)   History   Abdominal Pain   HPI Jon Mejia is a 83 y.o. male with history of A-fib on Eliquis , HTN presenting today for abdominal pain.  Patient states for the past couple weeks he has had intermittent abdominal pain issues with bloating and occasional sharp pain on the right side that lasts for only a couple seconds.  He has also had intermittent shortness of breath when he ambulates.  He has seen his primary care provider and was called that his liver enzymes were slightly elevated and his hemoglobin was slightly low.  He does admit to darker stools for the last couple of weeks but is unsure if they are completely black.  Denies any other bright red blood in his stools.  No hematemesis.  No dysuria.  Denies any chest pain, cough, congestion.  No history of cirrhosis and alcohol use.  History of cholecystectomy.  Chart review: Reviewed outpatient notes with his PCP as well as outpatient labs     Physical Exam   Triage Vital Signs: ED Triage Vitals  Encounter Vitals Group     BP 07/10/23 1121 (!) 132/98     Girls Systolic BP Percentile --      Girls Diastolic BP Percentile --      Boys Systolic BP Percentile --      Boys Diastolic BP Percentile --      Pulse Rate 07/10/23 1121 (!) 105     Resp 07/10/23 1121 18     Temp 07/10/23 1121 98.3 F (36.8 C)     Temp Source 07/10/23 1121 Oral     SpO2 07/10/23 1121 97 %     Weight 07/10/23 1122 172 lb 1.6 oz (78.1 kg)     Height 07/10/23 1122 5' 5 (1.651 m)     Head Circumference --      Peak Flow --      Pain Score 07/10/23 1122 3     Pain Loc --      Pain Education --      Exclude from Growth Chart --     Most recent vital signs: Vitals:   07/10/23 1330 07/10/23 1522  BP: 114/62   Pulse: 72   Resp: 18   Temp:  98.1 F (36.7 C)  SpO2: 100%    Physical Exam: I have  reviewed the vital signs and nursing notes. General: Awake, alert, no acute distress.  Nontoxic appearing. Head:  Atraumatic, normocephalic.   ENT:  EOM intact, PERRL. Oral mucosa is pink and moist with no lesions. Neck: Neck is supple with full range of motion, No meningeal signs. Cardiovascular:  RRR, No murmurs. Peripheral pulses palpable and equal bilaterally. Respiratory:  Symmetrical chest wall expansion.  No rhonchi, rales, or wheezes.  Good air movement throughout.  No use of accessory muscles.   Musculoskeletal:  No cyanosis or edema. Moving extremities with full ROM Abdomen:  Soft, sharp tenderness to palpation in right upper quadrant with mild soreness throughout elsewhere, nondistended. Neuro:  GCS 15, moving all four extremities, interacting appropriately. Speech clear. Psych:  Calm, appropriate.   Skin:  Warm, dry, no rash.    ED Results / Procedures / Treatments   Labs (all labs ordered are listed, but only abnormal results are displayed) Labs Reviewed  COMPREHENSIVE METABOLIC PANEL WITH GFR - Abnormal; Notable for the  following components:      Result Value   Glucose, Bld 151 (*)    Albumin 3.2 (*)    AST 70 (*)    ALT 52 (*)    Alkaline Phosphatase 162 (*)    All other components within normal limits  CBC WITH DIFFERENTIAL/PLATELET - Abnormal; Notable for the following components:   WBC 12.6 (*)    RBC 3.83 (*)    Hemoglobin 11.0 (*)    HCT 33.8 (*)    Neutro Abs 9.7 (*)    Monocytes Absolute 1.2 (*)    All other components within normal limits  LIPASE, BLOOD  BRAIN NATRIURETIC PEPTIDE  PROTIME-INR  APTT  CEA  CANCER ANTIGEN 19-9  AFP TUMOR MARKER     EKG    RADIOLOGY Independently interpreted CT imaging showing evidence of new multiple lesions in the right and left lobe of the liver 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                              Differential diagnosis includes, but is not limited to, upper GI bleed, melena, choledocholithiasis, gastroenteritis, colitis, pancreatitis, pneumonia, pneumothorax, hepatitis  Patient's presentation is most consistent with acute presentation with potential threat to life or bodily function.  Patient is an 83 year old male presenting today for right-sided abdominal pain with constipation and darker stools.  Is tender in the right upper quadrant on exam.  History of cholecystectomy.  Laboratory workup here at this time shows leukocytosis less than 3 days ago.  Hemoglobin at 11.0 three days ago was 11.5 and prior history of 14.  CMP with decrease in LFT markers.  Lipase normal.  Will get chest x-ray to evaluate shortness of breath as well as CT abdomen/pelvis to evaluate right upper quadrant and liver enzyme abnormalities.  CT imaging shows evidence of possible new metastatic liver disease with peritoneal carcinomatosis.  Given severity of progressive symptoms, will discuss case with hospitalist for possible admission for initial cancer workup.  Given that patient is on Eliquis , he would have several days of delay until potential biopsy as he needs washout.  I discussed the case with IR as well as oncology.  Ultimately, given that he is stable at this time, they did feel safe in him being discharged with immediate outpatient follow-up.  Plan is for him to see the oncology team early next week on Monday or Tuesday to set up his biopsies.  Dr. Adrian Alba with oncology has added additional labs to be sent off today before discharge.  Patient will be sent home with as needed pain, nausea, and constipation medication.  Patient and family are all agreeable  with this plan and he is stable for discharge.  Given strict return precautions for any worsening symptoms.  The patient is on the cardiac monitor to evaluate for evidence of arrhythmia and/or significant heart rate changes. Clinical Course as of 07/10/23 1536  Thu Jul 10, 2023  1222 Comprehensive metabolic panel(!) LFTs improved from 2 days ago [DW]  1348 Stools are brown in nature with no evidence of melena.  Tracely  positive Hemoccult.  Low suspicion for heavy GI bleeding [DW]  1502 Speak with IR to see when they would be able to do liver biopsy with him on eliquis . [DW]  1508 IR stated that earliest would be Monday for liver biopsy [DW]  1516 Oncology Dr. Adrian Alba - will establish him on Mon/Tues for clinic visit. Ordering additional labs at this time and then safe for discharge [DW]    Clinical Course User Index [DW] Kandee Orion, MD     FINAL CLINICAL IMPRESSION(S) / ED DIAGNOSES   Final diagnoses:  Liver lesion, left lobe  Peritoneal carcinomatosis (HCC)     Rx / DC Orders   ED Discharge Orders          Ordered    oxyCODONE  (ROXICODONE ) 5 MG immediate release tablet  Every 8 hours PRN        07/10/23 1535    ondansetron  (ZOFRAN ) 4 MG tablet  Every 8 hours PRN        07/10/23 1535    senna-docusate (SENOKOT-S) 8.6-50 MG tablet  Daily        07/10/23 1535             Note:  This document was prepared using Dragon voice recognition software and may include unintentional dictation errors.   Kandee Orion, MD 07/10/23 1455    Kandee Orion, MD 07/10/23 1537

## 2023-07-10 NOTE — Progress Notes (Signed)
   07/10/2023  Patient ID: Jon Mejia, male   DOB: 06-Oct-1940, 83 y.o.   MRN: [id number]  This patient is appearing on a report for being at risk of failing the adherence measure for identified medications this calendar year.   Medication Adherence Summary (STAR/HEDIS Monitoring): Adherence Category: cholesterol (statin)    Drug Name: Atorvastatin  80 mg Sold Date: 06/02/2023 Days' Supply: 30     Notes: ? Adherence data did NOT pulled from pharmacy claims portal Dr. Anson Basta. I called [facility] to get the about fill history. Attempted to call the patient but he and his wife where on the phone nurse's triage line for [facility]. Informed wife I will send a message in MyChart. ? Reviewed barriers to adherence: none identified. ? Plan: MyChart message sent to patient.  Alexandria Angel, PharmD Clinical Pharmacist Cell: [phone]

## 2023-07-11 ENCOUNTER — Telehealth: Payer: Self-pay | Admitting: *Deleted

## 2023-07-11 LAB — CANCER ANTIGEN 19-9: CA 19-9: 18 U/mL (ref 0–35)

## 2023-07-11 LAB — CEA: CEA: 1.6 ng/mL (ref 0.0–4.7)

## 2023-07-11 NOTE — Telephone Encounter (Signed)
 Wife calling to make sure the appointment date and time and I gave it to her .  Then she said was there as scan.  I told her that there is not a scan put on here however whenever she comes with the patient they might schedule the scan if needed.  She also wanted to know if the daughter could come and I told her we only allow 1 person to come in however she can get.  Wife is great with that on her phone and when the doctor is in there she could listen to all the information

## 2023-07-12 LAB — AFP TUMOR MARKER: AFP, Serum, Tumor Marker: 2.6 ng/mL (ref 0.0–8.4)

## 2023-07-14 ENCOUNTER — Encounter: Payer: Self-pay | Admitting: Oncology

## 2023-07-14 ENCOUNTER — Inpatient Hospital Stay: Attending: Oncology | Admitting: Oncology

## 2023-07-14 ENCOUNTER — Telehealth: Payer: Self-pay | Admitting: *Deleted

## 2023-07-14 ENCOUNTER — Inpatient Hospital Stay

## 2023-07-14 ENCOUNTER — Telehealth: Payer: Self-pay

## 2023-07-14 VITALS — BP 138/78 | HR 98 | Temp 97.6°F | Resp 16 | Wt 171.0 lb

## 2023-07-14 DIAGNOSIS — D72829 Elevated white blood cell count, unspecified: Secondary | ICD-10-CM | POA: Diagnosis not present

## 2023-07-14 DIAGNOSIS — R509 Fever, unspecified: Secondary | ICD-10-CM | POA: Insufficient documentation

## 2023-07-14 DIAGNOSIS — G893 Neoplasm related pain (acute) (chronic): Secondary | ICD-10-CM | POA: Diagnosis not present

## 2023-07-14 DIAGNOSIS — Z79899 Other long term (current) drug therapy: Secondary | ICD-10-CM | POA: Diagnosis not present

## 2023-07-14 DIAGNOSIS — Z87891 Personal history of nicotine dependence: Secondary | ICD-10-CM | POA: Insufficient documentation

## 2023-07-14 DIAGNOSIS — C786 Secondary malignant neoplasm of retroperitoneum and peritoneum: Secondary | ICD-10-CM | POA: Insufficient documentation

## 2023-07-14 DIAGNOSIS — C801 Malignant (primary) neoplasm, unspecified: Secondary | ICD-10-CM | POA: Insufficient documentation

## 2023-07-14 DIAGNOSIS — R7401 Elevation of levels of liver transaminase levels: Secondary | ICD-10-CM | POA: Diagnosis not present

## 2023-07-14 DIAGNOSIS — D649 Anemia, unspecified: Secondary | ICD-10-CM | POA: Diagnosis not present

## 2023-07-14 DIAGNOSIS — Z7901 Long term (current) use of anticoagulants: Secondary | ICD-10-CM | POA: Insufficient documentation

## 2023-07-14 DIAGNOSIS — R160 Hepatomegaly, not elsewhere classified: Secondary | ICD-10-CM

## 2023-07-14 DIAGNOSIS — C787 Secondary malignant neoplasm of liver and intrahepatic bile duct: Secondary | ICD-10-CM | POA: Insufficient documentation

## 2023-07-14 NOTE — Telephone Encounter (Signed)
-----   Message from Morey Ar sent at 07/14/2023  9:34 AM EDT ----- Regarding: FW: Other Pre-op team,   Please create a phone note for this request so it can be addressed per protocol.   Thank you!  DW ----- Message ----- From: Rochell Chroman, RN Sent: 07/14/2023   9:32 AM EDT To: [facility] Subject: Other

## 2023-07-14 NOTE — Progress Notes (Signed)
   07/14/2023  Patient ID: Jon Mejia, male   DOB: 1940-09-29, 83 y.o.   MRN: [id number]  Care Coordination Call  I called [facility] to get atorvastatin  refilled; medication is ready for pick up.  Alexandria Angel, PharmD Clinical Pharmacist Cell: [phone]

## 2023-07-14 NOTE — Telephone Encounter (Signed)
   Pre-operative Risk Assessment    Patient Name: Jon Mejia  DOB: 1940/04/12 MRN: [id number]   Date of last office visit: 04/25/23 Toribio Frees, [facility] Date of next office visit: 10/28/23 Toribio Frees, [facility]   Request for Surgical Clearance    Procedure:  LIVER Bx   Date of Surgery:  Clearance TBD                                Surgeon:  NOT LISTED Surgeon's Group or Practice Name:  [facility] Phone number:  [phone] Fax number:  [phone]   Type of Clearance Requested:   - Medical  - Pharmacy:  Hold Apixaban  (Eliquis )     Type of Anesthesia:  Not Indicated   Additional requests/questions:    [place] Broom   07/14/2023, 9:38 AM    Rochell Chroman, RN on 07/10/2023     [facility] - A Dept Of [place] [address] [place], [place]  [address] Phone:  [phone]   Fax:  [phone]                                                   BLOOD THINNER INFORMATION REQUEST 07/10/2023   Dear Dr. Jerelene Monday,   Your patient Jon Mejia (DOB:  07-13-1940 ) is being scheduled for a Liver Biopsy procedure on (date pending clearance for holding Eliquis .).   [name]  states that you have prescribed Eliquis . Please advise on the following :   May patient stop Eliquis  and if so how many days prior to procedure.   When should the patient restart Eliquis  after the procedure.   If the patient cannot stop  blood thinner due to medical necessisty. (Please contact patient and give prescriptions instruction).   Please call [phone] with any questions.   Thank you,     Kristi D Stanton

## 2023-07-14 NOTE — Telephone Encounter (Signed)
 Pt went to ED

## 2023-07-14 NOTE — Telephone Encounter (Signed)
 Please advise holding Eliquis  prior to liver biopsy.   Thank you!  DW

## 2023-07-14 NOTE — Telephone Encounter (Signed)
 Patient with diagnosis of atrial fibrillation on Eliquis  for anticoagulation.    Procedure:  LIVER Bx    Date of Surgery:  Clearance TBD           CHA2DS2-VASc Score = 6   This indicates a 9.7% annual risk of stroke. The patient's score is based upon: CHF History: 0 HTN History: 1 Diabetes History: 0 Stroke History: 2 Vascular Disease History: 1 Age Score: 2 Gender Score: 0   Per chart stroke in 02/2018  CrCl 61  Platelet count 334  Patient has not had an Afib/aflutter ablation within the last 3 months or DCCV within the last 30 days  Per office protocol, patient can hold Eliquis  for 2-3 days prior to procedure.   Patient will not need bridging with Lovenox (enoxaparin) around procedure.  **This guidance is not considered finalized until pre-operative APP has relayed final recommendations.**

## 2023-07-14 NOTE — Telephone Encounter (Signed)
 Med rec and consent done      Patient Consent for Virtual Visit        [name] has provided verbal consent on 07/14/2023 for a virtual visit (video or telephone).   CONSENT FOR VIRTUAL VISIT FOR:  Jon Mejia  By participating in this virtual visit I agree to the following:  I hereby voluntarily request, consent and authorize [facility] and its employed or contracted physicians, physician assistants, nurse practitioners or other licensed health care professionals (the Practitioner), to provide me with telemedicine health care services (the "Services) as deemed necessary by the treating Practitioner. I acknowledge and consent to receive the Services by the Practitioner via telemedicine. I understand that the telemedicine visit will involve communicating with the Practitioner through live audiovisual communication technology and the disclosure of certain medical information by electronic transmission. I acknowledge that I have been given the opportunity to request an in-person assessment or other available alternative prior to the telemedicine visit and am voluntarily participating in the telemedicine visit.  I understand that I have the right to withhold or withdraw my consent to the use of telemedicine in the course of my care at any time, without affecting my right to future care or treatment, and that the Practitioner or I may terminate the telemedicine visit at any time. I understand that I have the right to inspect all information obtained and/or recorded in the course of the telemedicine visit and may receive copies of available information for a reasonable fee.  I understand that some of the potential risks of receiving the Services via telemedicine include:  Delay or interruption in medical evaluation due to technological equipment failure or disruption; Information transmitted may not be sufficient (e.g. poor resolution of images) to allow for appropriate medical  decision making by the Practitioner; and/or  In rare instances, security protocols could fail, causing a breach of personal health information.  Furthermore, I acknowledge that it is my responsibility to provide information about my medical history, conditions and care that is complete and accurate to the best of my ability. I acknowledge that Practitioner's advice, recommendations, and/or decision may be based on factors not within their control, such as incomplete or inaccurate data provided by me or distortions of diagnostic images or specimens that may result from electronic transmissions. I understand that the practice of medicine is not an exact science and that Practitioner makes no warranties or guarantees regarding treatment outcomes. I acknowledge that a copy of this consent can be made available to me via my patient portal [facility] MyChart), or I can request a printed copy by calling the office of [facility].    I understand that my insurance will be billed for this visit.   I have read or had this consent read to me. I understand the contents of this consent, which adequately explains the benefits and risks of the Services being provided via telemedicine.  I have been provided ample opportunity to ask questions regarding this consent and the Services and have had my questions answered to my satisfaction. I give my informed consent for the services to be provided through the use of telemedicine in my medical care

## 2023-07-14 NOTE — Telephone Encounter (Signed)
   Name: Jon Mejia  DOB: [birth year]  MRN: [id number]  Primary Cardiologist: Belva Boyden, MD   Preoperative team, please contact this patient and set up a phone call appointment for further preoperative risk assessment. Please obtain consent and complete medication review. Thank you for your help.  I confirm that guidance regarding antiplatelet and oral anticoagulation therapy has been completed and, if necessary, noted below.  Per Pharm D, patient may hold Eliquis  for 2-3 days prior to procedure. Patient will not need bridging with Lovenox around procedure.      I also confirmed the patient resides in the state of [place] . As per [facility] Medical Board telemedicine laws, the patient must reside in the state in which the provider is licensed.   Morey Ar, NP 07/14/2023, 4:54 PM [facility]

## 2023-07-14 NOTE — Progress Notes (Signed)
 [facility]  [phone] Fax:(336) [phone]  ID: Jon Mejia OB: [birth year]  MR#: [id number]  VHQ#:[id number]  Patient Care Team: Lamon Pillow, MD as PCP - General (Family Medicine) Jerelene Monday Deadra Everts, MD as PCP - Cardiology (Cardiology) Arminda Berth, OD as Consulting Physician (Optometry) Jerelene Monday, Deadra Everts, MD as Consulting Physician (Cardiology) Rochell Chroman, RN as Oncology Nurse Navigator  CHIEF COMPLAINT: Multiple liver lesions with carcinomatosis, suspicious for malignancy.  INTERVAL HISTORY: Patient is an 83 year old male who recently presented to the emergency room with 3 to 4 weeks of abdominal pain.  Subsequent workup including CT scan which revealed multiple lesions in his liver highly suspicious for underlying malignancy.  He otherwise feels well.  He has no neurologic complaints.  He denies any recent fevers or illnesses.  He has a fair appetite and denies weight loss.  He has no chest pain, sore throat, cough, or hemoptysis.  He denies any nausea, vomiting, constipation, or diarrhea.  He has no melena or hematochezia.  He has no urinary complaints.  Patient offers no further specific complaints today.  REVIEW OF SYSTEMS:   Review of Systems  Constitutional: Negative.  Negative for fever, malaise/fatigue and weight loss.  Respiratory: Negative.  Negative for cough, hemoptysis and shortness of breath.   Cardiovascular: Negative.  Negative for chest pain and leg swelling.  Gastrointestinal:  Positive for abdominal pain. Negative for blood in stool, constipation, diarrhea, melena, nausea and vomiting.  Genitourinary: Negative.  Negative for dysuria.  Musculoskeletal: Negative.  Negative for back pain.  Skin: Negative.  Negative for rash.  Neurological: Negative.  Negative for dizziness, focal weakness, weakness and headaches.  Psychiatric/Behavioral: Negative.  The patient is not nervous/anxious.     As per HPI. Otherwise, a  complete review of systems is negative.  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   CAD (coronary artery disease) 09/17/2021   a.) R/LHC 09/17/2021: 30% pRCA, 40% dRCA, 50% pLCx, 30% dLCx, 50% pLAD, 50% mLAD, 50% dLAD --> med mgmt   Cardiac murmur    Cervical spondylosis    Cholelithiasis    CRAO (central retinal artery occlusion), 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   CVA (cerebral vascular accident) (HCC) 03/13/2018   a.) MRI brain 03/14/2018 --> acute infarct in the posterior RIGHT frontal lobe; small chronic cerebellar infarct --> Tx'd with TPA with (+) improvement/resolution of symptoms   Diastolic dysfunction    a.) TTE 01/31/2015: EF 55-60%, MAC, G1DD; b.) TTE 03/14/2018: EF 60-65%, mild LAE, sev MAC, sev AoV cal with mod AS (MPG 14) G2DD; c.) TTE 09/13/2019: EF 55-60%, mild LAE, mild-mod MR, mild AS (MPG 12.5), G2DD; d.) TTE 08/16/2021: EF 60-65%, mild LVH, mod MAC, mild MR, mod AS (MPG 18.5), G1DD; e.) TTE 12/05/2021: EF 60-65%, mod MAC, s/p TAVR (well seated/functioning valve), mild MR, G1DD   GERD (gastroesophageal reflux disease)    History of chicken pox    History of measles    Hyperlipidemia    Hypertension    LBBB (left bundle branch block)    Long term current use of anticoagulant    a.) apixaban    Low back pain radiating to  left lower extremity    PAF (paroxysmal atrial fibrillation) (HCC)    a.) CHA2DS2VASc = 6 (age x2, HTN, CVA x2, vascular disease history);  b.) rate/rhythm maintained on oral metoprolol  succinate; chronically anticoagulated with apixaban    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   a.) 26mm S3UR via RIGHT TF approach   Skin cancer      PAST SURGICAL HISTORY: Past Surgical History:  Procedure Laterality Date   INTRAOPERATIVE TRANSTHORACIC ECHOCARDIOGRAM N/A 11/06/2021   Procedure: INTRAOPERATIVE TRANSTHORACIC ECHOCARDIOGRAM;  Surgeon: Odie Benne, MD;  Location: [facility] OR;  Service: Open Heart Surgery;  Laterality: N/A;   RIGHT/LEFT HEART CATH AND CORONARY ANGIOGRAPHY N/A 09/17/2021   Procedure: RIGHT/LEFT HEART CATH AND CORONARY ANGIOGRAPHY;  Surgeon: Odie Benne, MD;  Location: [facility] INVASIVE CV LAB;  Service: Cardiovascular;  Laterality: N/A;   ROBOTIC ASSISTED LAPAROSCOPIC CHOLECYSTECTOMY  07/24/2022   SKIN CANCER EXCISION  07/2020   TRANSCATHETER AORTIC VALVE REPLACEMENT, TRANSFEMORAL N/A 11/06/2021   Procedure: Transcatheter Aortic Valve Replacement, Transfemoral;  Surgeon: Odie Benne, MD;  Location: [facility] OR;  Service: Open Heart Surgery;  Laterality: N/A;    FAMILY HISTORY: Family History  Problem Relation Age of Onset   Arrhythmia Mother        A-Fib   Lung cancer Father    Diabetes Son    Colon cancer Neg Hx    Prostate cancer Neg Hx     ADVANCED DIRECTIVES (Y/N):  N  HEALTH MAINTENANCE: Social History   Tobacco Use   Smoking status: Former    Current packs/day: 0.00    Average packs/day: 1 pack/day for 20.0 years (20.0 ttl pk-yrs)    Types: Cigarettes    Start date: 03/12/1963    Quit date: 03/12/1983    Years since quitting: 40.3    Passive exposure: Past   Smokeless tobacco: Never  Vaping Use   Vaping status: Never Used  Substance Use Topics   Alcohol use: No   Drug use: No     Colonoscopy:  PAP:  Bone density:  Lipid panel:  No Known Allergies  Current Outpatient Medications  Medication Sig Dispense Refill   amLODipine  (NORVASC ) 10 MG tablet Take 1 tablet (10 mg total) by mouth daily. 90 tablet 3   apixaban  (ELIQUIS ) 5 MG TABS tablet TAKE ONE (1) TABLET BY MOUTH TWO TIMES PER DAYTAKE ONE (1) TABLET BY MOUTH TWO TIMES PER DAY, may resume on Friday  AM, June 28. 180 tablet 1   atorvastatin  (LIPITOR) 80 MG tablet TAKE ONE TABLET BY MOUTH ONCE DAILY 30 tablet 3   ibuprofen  (ADVIL ) 800 MG tablet Take 1 tablet (800 mg total) by mouth every 8 (eight) hours as needed. 30 tablet 0   metoprolol  succinate (TOPROL -XL) 25 MG 24 hr tablet TAKE ONE AND ONE-HALF TABLETS DAILY 45 tablet 3   ondansetron  (ZOFRAN ) 4 MG tablet Take 1 tablet (4 mg total) by mouth every 8 (eight) hours as needed. 15 tablet 0   oxyCODONE  (ROXICODONE ) 5 MG immediate release tablet Take 1 tablet (5 mg total) by mouth every 8 (eight) hours as needed. 10 tablet 0   senna-docusate (SENOKOT-S) 8.6-50 MG tablet Take 1 tablet by mouth daily. 30 tablet 0   amoxicillin  (AMOXIL ) 500 MG tablet Take 4 tablets (2,000 mg total) by mouth as directed. 1 hour prior to dental work including cleanings (Patient not taking: Reported on 07/14/2023) 12 tablet 12   Cyanocobalamin (B-12) 3000 MCG CAPS Take 3,000 mcg by mouth daily as needed (  energy). (Patient not taking: Reported on 07/14/2023)     No current facility-administered medications for this visit.    OBJECTIVE: Vitals:   07/14/23 0834  BP: 138/78  Pulse: 98  Resp: 16  Temp: 97.6 F (36.4 C)  SpO2: 98%     Body mass index is 28.46 kg/m.    ECOG FS:1 - Symptomatic but completely ambulatory  General: Well-developed, well-nourished, no acute distress. Eyes: Pink conjunctiva, anicteric sclera. HEENT: Normocephalic, moist mucous membranes. Lungs: No audible wheezing or coughing. Heart: Regular rate and rhythm. Abdomen: Soft, nontender, no obvious distention. Musculoskeletal: No edema, cyanosis, or clubbing. Neuro: Alert, answering all questions appropriately. Cranial nerves grossly intact. Skin: No rashes or petechiae noted. Psych: Normal affect. Lymphatics: No cervical, calvicular, axillary or inguinal LAD.   LAB RESULTS:  Lab Results  Component Value Date   NA 137 07/10/2023   K 3.9 07/10/2023   CL 104 07/10/2023   CO2 22  07/10/2023   GLUCOSE 151 (H) 07/10/2023   BUN 15 07/10/2023   CREATININE 1.06 07/10/2023   CALCIUM  8.9 07/10/2023   PROT 7.1 07/10/2023   ALBUMIN 3.2 (L) 07/10/2023   AST 70 (H) 07/10/2023   ALT 52 (H) 07/10/2023   ALKPHOS 162 (H) 07/10/2023   BILITOT 0.9 07/10/2023   GFRNONAA >60 07/10/2023   GFRAA 81 08/25/2019    Lab Results  Component Value Date   WBC 12.6 (H) 07/10/2023   NEUTROABS 9.7 (H) 07/10/2023   HGB 11.0 (L) 07/10/2023   HCT 33.8 (L) 07/10/2023   MCV 88.3 07/10/2023   PLT 334 07/10/2023     STUDIES: CT ABDOMEN PELVIS W CONTRAST Result Date: 07/10/2023 CLINICAL DATA:  Right upper quadrant abdominal pain EXAM: CT ABDOMEN AND PELVIS WITHOUT CONTRAST TECHNIQUE: Multidetector CT imaging of the abdomen and pelvis was performed following the standard protocol without IV contrast. RADIATION DOSE REDUCTION: This exam was performed according to the departmental dose-optimization program which includes automated exposure control, adjustment of the mA and/or kV according to patient size and/or use of iterative reconstruction technique. COMPARISON:  CT abdomen and pelvis September 28, 2021 and right upper quadrant abdominal ultrasound May 24, 2022 FINDINGS: Lower chest: No infiltrates or consolidations, no pleural effusions Hepatobiliary: The liver demonstrates multiple inhomogeneous lesions throughout the right and left lobe of the liver measuring from a few mm up to several cm largest in the right lobe of the liver 2.3 cm along the superior margin and 2.2 cm in segment 5. Within the left lobe of the liver segment 4A and 4B multiple lesions measuring between 1 and 2 cm in diameter findings are highly suggestive of metastatic liver disease. Prior cholecystectomy. No biliary dilatation. There is a small amount of ascites abdomen and pelvis with diffuse nodularity of the mesentery findings that are highly consistent with peritoneal carcinomatosis. Pancreas: Pancreas normal size. No masses  calcifications or inflammatory changes. Spleen: Spleen normal size.  No masses. Adrenals/Urinary Tract: Adrenal glands are normal size. Follow-up recommended. Kidneys are normal. No masses calcifications or hydronephrosis Stomach/Bowel: No small or large bowel obstruction or inflammatory changes. Moderate amount of residual fecal material throughout the colon without obstruction or constipation. Vascular/Lymphatic: No significant vascular findings are present. No enlarged abdominal or pelvic lymph nodes. Reproductive: .  No masses. Bladder unremarkable. Other: Anterior abdominal wall unremarkable without evidence of umbilical or inguinal hernias Musculoskeletal: Visualized portion of the thoracolumbar spine and pelvic structures grossly unremarkable without evidence of fracture bony abnormalities or soft tissue masses. IMPRESSION: *Multiple inhomogeneous lesions  throughout the right and left lobe of the liver highly suggestive of metastatic liver disease. *Small amount of ascites abdomen and pelvis with diffuse nodularity of the mesentery findings that are highly consistent with peritoneal carcinomatosis. *Moderate amount of residual fecal material throughout the colon without obstruction or constipation. Electronically Signed   By: Fredrich Jefferson M.D.   On: 07/10/2023 14:37   DG Chest Portable 1 View Result Date: 07/10/2023 CLINICAL DATA:  Shortness of breath. EXAM: PORTABLE CHEST 1 VIEW COMPARISON:  Chest radiograph dated 11/02/2021. FINDINGS: No focal consolidation, pleural effusion or pneumothorax. The cardiac silhouette is within normal limits. Aortic valve repair. Atherosclerotic calcification of the aorta. No acute osseous pathology. IMPRESSION: No active disease. Electronically Signed   By: Angus Bark M.D.   On: 07/10/2023 12:37    ASSESSMENT: Multiple liver lesions with carcinomatosis, suspicious for malignancy.  PLAN:    Multiple liver lesions with carcinomatosis, suspicious for malignancy:  CT scan results from July 10, 2023 reviewed independently and reported as above with multiple liver lesions and carcinomatosis highly suspicious for underlying malignancy.  Tumor markers are negative.  Patient has a PET scan tomorrow for further evaluation.  He also will require CT-guided biopsy to confirm diagnosis.  No further intervention is needed.  Patient will return to clinic 1 week after the biopsy to discuss the results and treatment planning if necessary. Anemia: Mild, monitor.  Patient's most recent hemoglobin is 11.0. Leukocytosis: Likely reactive, monitor. Transaminitis: Mild, monitor.  I spent a total of 60 minutes reviewing chart data, face-to-face evaluation with the patient, counseling and coordination of care as detailed above.   Patient expressed understanding and was in agreement with this plan. He also understands that He can call clinic at any time with any questions, concerns, or complaints.    Cancer Staging  No matching staging information was found for the patient.   Shellie Dials, MD   07/14/2023 3:06 PM

## 2023-07-14 NOTE — Telephone Encounter (Signed)
 S/W pt and schedule TELE Preop appt 07/18/23. Med rec and consent done

## 2023-07-15 ENCOUNTER — Ambulatory Visit
Admission: RE | Admit: 2023-07-15 | Discharge: 2023-07-15 | Disposition: A | Discharge status: Home / Self Care | Source: Ambulatory Visit | Attending: Oncology | Admitting: Oncology

## 2023-07-15 DIAGNOSIS — C786 Secondary malignant neoplasm of retroperitoneum and peritoneum: Secondary | ICD-10-CM | POA: Insufficient documentation

## 2023-07-15 DIAGNOSIS — R180 Malignant ascites: Secondary | ICD-10-CM | POA: Insufficient documentation

## 2023-07-15 DIAGNOSIS — K449 Diaphragmatic hernia without obstruction or gangrene: Secondary | ICD-10-CM | POA: Diagnosis not present

## 2023-07-15 DIAGNOSIS — R160 Hepatomegaly, not elsewhere classified: Secondary | ICD-10-CM

## 2023-07-15 DIAGNOSIS — K769 Liver disease, unspecified: Secondary | ICD-10-CM | POA: Insufficient documentation

## 2023-07-15 DIAGNOSIS — Z952 Presence of prosthetic heart valve: Secondary | ICD-10-CM | POA: Diagnosis not present

## 2023-07-15 LAB — GLUCOSE, CAPILLARY: Glucose-Capillary: 101 mg/dL — ABNORMAL HIGH (ref 70–99)

## 2023-07-15 MED ORDER — FLUDEOXYGLUCOSE F - 18 (FDG) INJECTION
8.9000 | Freq: Once | INTRAVENOUS | Status: AC | PRN
  Administered 2023-07-15: 9.7 via INTRAVENOUS

## 2023-07-16 ENCOUNTER — Telehealth: Payer: Self-pay

## 2023-07-16 NOTE — Telephone Encounter (Signed)
 Spoke to spouse, Adriana Hopping, along with Mr. Branden and his daughter, Provided results of PET scan. Awaiting scheduling of biopsy. Encouraged to call with any further questions or needs.

## 2023-07-16 NOTE — Progress Notes (Signed)
 Virtual Visit via Telephone Note   Because of RECO SHONK co-morbid illnesses, he is at least at moderate risk for complications without adequate follow up.  This format is felt to be most appropriate for this patient at this time.  Due to technical limitations with video connection (technology), today's appointment will be conducted as an audio only telehealth visit, and Jon Mejia verbally agreed to proceed in this manner.   All issues noted in this document were discussed and addressed.  No physical exam could be performed with this format.  Evaluation Performed:  Preoperative cardiovascular risk assessment _____________   Date:  07/16/2023   Patient ID:  Jon Mejia, Jon Mejia [birth year], MRN [id number] Patient Location:  Home Provider location:   Office  Primary Care Provider:  Lamon Pillow, MD Primary Cardiologist:  Belva Boyden, MD  Chief Complaint / Patient Profile   83 y.o. y/o male with a h/o hypertension, atrial fibrillation, LBBB, hyperlipidemia, CVA, TAVR who is pending liver biopsy and presents today for telephonic preoperative cardiovascular risk assessment.  History of Present Illness    Jon Mejia is a 83 y.o. male who presents via audio/video conferencing for a telehealth visit today.  Pt was last seen in cardiology clinic on 04/25/2023 by Cadence Furth PA-C.  At that time Jon Mejia was doing well .  The patient is now pending procedure as outlined above. Since his last visit, he continues to be stable from a cardiac standpoint.  Today he denies chest pain, shortness of breath, lower extremity edema, fatigue, palpitations, melena, hematuria, hemoptysis, diaphoresis, weakness, presyncope, syncope, orthopnea, and PND.   Past Medical History    Past Medical History:  Diagnosis Date   Angina pectoris (HCC)    Aortic atherosclerosis (HCC)    Aortic stenosis    a.) TTE 03/14/2018: mod AS (MPG 14); b.) TTE 09/13/2019: mild AS (MPG 12.5); c.) TTE  08/16/2021: mod AS (MPG 18.5); d.) R/LHC 09/17/2021: mRA 3, mPA 19, mPCWP 11, LVEDP 15, CO 5.22, CI 2.75; e.) s/p TAVR 11/06/2021   CAD (coronary artery disease) 09/17/2021   a.) R/LHC 09/17/2021: 30% pRCA, 40% dRCA, 50% pLCx, 30% dLCx, 50% pLAD, 50% mLAD, 50% dLAD --> med mgmt   Cardiac murmur    Cervical spondylosis    Cholelithiasis    CRAO (central retinal artery occlusion), 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   CVA (cerebral vascular accident) (HCC) 03/13/2018   a.) MRI brain 03/14/2018 --> acute infarct in the posterior RIGHT frontal lobe; small chronic cerebellar infarct --> Tx'd with TPA with (+) improvement/resolution of symptoms   Diastolic dysfunction    a.) TTE 01/31/2015: EF 55-60%, MAC, G1DD; b.) TTE 03/14/2018: EF 60-65%, mild LAE, sev MAC, sev AoV cal with mod AS (MPG 14) G2DD; c.) TTE 09/13/2019: EF 55-60%, mild LAE, mild-mod MR, mild AS (MPG 12.5), G2DD; d.) TTE 08/16/2021: EF 60-65%, mild LVH, mod MAC, mild MR, mod AS (MPG 18.5), G1DD; e.) TTE 12/05/2021: EF 60-65%, mod MAC, s/p TAVR (well seated/functioning valve), mild MR, G1DD   GERD (gastroesophageal reflux disease)    History of chicken pox    History of measles    Hyperlipidemia    Hypertension    LBBB (left bundle branch block)    Long term current use of anticoagulant    a.) apixaban    Low back pain radiating to left lower extremity    PAF (paroxysmal atrial fibrillation) (HCC)    a.) CHA2DS2VASc = 6 (age x2, HTN, CVA x2, vascular disease  history);  b.) rate/rhythm maintained on oral metoprolol  succinate; chronically anticoagulated with apixaban    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   a.) 26mm S3UR via RIGHT TF approach   Skin cancer    Past Surgical History:  Procedure Laterality Date   INTRAOPERATIVE TRANSTHORACIC ECHOCARDIOGRAM N/A 11/06/2021   Procedure: INTRAOPERATIVE TRANSTHORACIC ECHOCARDIOGRAM;  Surgeon: Odie Benne, MD;  Location: [facility] OR;  Service: Open Heart Surgery;  Laterality: N/A;    RIGHT/LEFT HEART CATH AND CORONARY ANGIOGRAPHY N/A 09/17/2021   Procedure: RIGHT/LEFT HEART CATH AND CORONARY ANGIOGRAPHY;  Surgeon: Odie Benne, MD;  Location: [facility] INVASIVE CV LAB;  Service: Cardiovascular;  Laterality: N/A;   ROBOTIC ASSISTED LAPAROSCOPIC CHOLECYSTECTOMY  07/24/2022   SKIN CANCER EXCISION  07/2020   TRANSCATHETER AORTIC VALVE REPLACEMENT, TRANSFEMORAL N/A 11/06/2021   Procedure: Transcatheter Aortic Valve Replacement, Transfemoral;  Surgeon: Odie Benne, MD;  Location: [facility] OR;  Service: Open Heart Surgery;  Laterality: N/A;    Allergies  No Known Allergies  Home Medications    Prior to Admission medications   Medication Sig Start Date End Date Taking? Authorizing Provider  amLODipine  (NORVASC ) 10 MG tablet Take 1 tablet (10 mg total) by mouth daily. 04/25/23   Furth, Cadence H, PA-C  amoxicillin  (AMOXIL ) 500 MG tablet Take 4 tablets (2,000 mg total) by mouth as directed. 1 hour prior to dental work including cleanings Patient not taking: Reported on 07/14/2023 11/15/22   Ardia Kraft, PA-C  apixaban  (ELIQUIS ) 5 MG TABS tablet TAKE ONE (1) TABLET BY MOUTH TWO TIMES PER DAYTAKE ONE (1) TABLET BY MOUTH TWO TIMES PER DAY, may resume on Friday AM, June 28. 07/24/22   Flynn Hylan, MD  atorvastatin  (LIPITOR) 80 MG tablet TAKE ONE TABLET BY MOUTH ONCE DAILY 04/28/23   Sethi, Pramod S, MD  Cyanocobalamin (B-12) 3000 MCG CAPS Take 3,000 mcg by mouth daily as needed (energy). Patient not taking: Reported on 07/14/2023    [provider]  ibuprofen  (ADVIL ) 800 MG tablet Take 1 tablet (800 mg total) by mouth every 8 (eight) hours as needed. 07/24/22   Flynn Hylan, MD  metoprolol  succinate (TOPROL -XL) 25 MG 24 hr tablet TAKE ONE AND ONE-HALF TABLETS DAILY 06/02/23   Furth, Cadence H, PA-C  ondansetron  (ZOFRAN ) 4 MG tablet Take 1 tablet (4 mg total) by mouth every 8 (eight) hours as needed. 07/10/23 07/09/24  Kandee Orion, MD  oxyCODONE   (ROXICODONE ) 5 MG immediate release tablet Take 1 tablet (5 mg total) by mouth every 8 (eight) hours as needed. 07/10/23 07/09/24  Kandee Orion, MD  senna-docusate (SENOKOT-S) 8.6-50 MG tablet Take 1 tablet by mouth daily. 07/10/23   Kandee Orion, MD    Physical Exam    Vital Signs:  Jon Mejia does not have vital signs available for review today.  Given telephonic nature of communication, physical exam is limited. AAOx3. NAD. Normal affect.  Speech and respirations are unlabored.  Accessory Clinical Findings    None  Assessment & Plan    1.  Preoperative Cardiovascular Risk Assessment:Procedure:  LIVER Bx    Date of Surgery:  Clearance TBD                                  Surgeon:  NOT LISTED Surgeon's Group or Practice Name:  [facility] Phone number:  [phone] Fax number:  [phone]      Primary Cardiologist:  Timothy Gollan, MD  Chart reviewed as part of pre-operative protocol coverage. Given past medical history and time since last visit, based on ACC/AHA guidelines, Jon Mejia would be at acceptable risk for the planned procedure without further cardiovascular testing.   His RCRI is moderate risk, 6.6% risk of major cardiac event.  He is able to complete greater than 4 METS of physical activity.  Patient was advised that if he develops new symptoms prior to surgery to contact our office to arrange a follow-up appointment.  He verbalized understanding.  His Eliquis  may be held for 2-3 days prior to his procedure.  He does not require Lovenox bridging  surrounding procedure.  I will route this recommendation to the requesting party via Epic fax function and remove from pre-op pool.      Time:   Today, I have spent  6 minutes with the patient with telehealth technology discussing medical history, symptoms, and management plan. I spent 10 minutes reviewing patient's past cardiac history and cardiac medications.     Carie Charity, NP  07/16/2023, 1:35 PM

## 2023-07-17 ENCOUNTER — Telehealth: Payer: Self-pay

## 2023-07-17 NOTE — Telephone Encounter (Signed)
 Abdominal biopsy scheduled for July 24, 2023  Arrive at 0730  for 0830 appointment Come into the Heart and Vascular entrance at [facility]. This entrance is located in the front of the hospital. Do not eat or drink anything after midnight Take your regularly scheduled blood pressure, pain, or seizure medication You will need a driver for this procedure. Eliquis  hold pending approval. This will need to be held for 48 hours prior to procedure (last dose 6/23). He speaks to cardiology tomorrow for clearance.

## 2023-07-18 ENCOUNTER — Ambulatory Visit: Attending: Cardiology

## 2023-07-18 DIAGNOSIS — Z01810 Encounter for preprocedural cardiovascular examination: Secondary | ICD-10-CM

## 2023-07-18 NOTE — Progress Notes (Signed)
 Rosetta Cons, MD sent to [facility] S PROCEDURE / BIOPSY REVIEW Date: 07/17/23  Requested Biopsy site: Peritoneal mass Reason for request: peritoneal carcinomatosis Imaging review: Best seen on PET  Decision: Approved Imaging modality to perform: CT Schedule with: Moderate Sedation Schedule for: Any VIR  Additional comments: @Schedulers .  Please contact me with questions, concerns, or if issue pertaining to this request arise.  Rosetta Cons, MD [facility]

## 2023-07-22 NOTE — H&P (Signed)
 Chief Complaint: Patient was seen in consultation today for peritoneal carcinomatosis   Procedure: Abdominal Mass Biopsy  Referring Physician(s): Finnegan,Timothy J  Supervising Physician: Luverne Aran  Patient Status: [facility] - Out-pt  History of Present Illness: Jon Mejia is a 83 y.o. male with a history of aortic stenosis s/p TAVR who was recently evaluated in the ED due to multiple weeks of abdominal pain. In ED, CT A/P revealed:  IMPRESSION: *Multiple inhomogeneous lesions throughout the right and left lobe of the liver highly suggestive of metastatic liver disease. *Small amount of ascites abdomen and pelvis with diffuse nodularity of the mesentery findings that are highly consistent with peritoneal carcinomatosis.  Patient was subsequently referred to Dr. Jacobo in Oncology for further evaluation. Additional lab work negative for tumor markers and PET scan with similar findings to CT: hypermetabolic peritoneal carcinomatosis and numerous hypermetabolic liver lesions. Patient referred to IR for biopsy for diagnostic confirmation.  Patient is resting in bed with family at the bedside. States that he has had intermittent abdominal pain, some mild abdominal distention, and worsening shortness of breath for the past month. He denies any chest pain, nausea/vomiting, fever/chills. Patient is currently on Eliquis , but has been states his last dose was on Monday 6/23. NPO since midnight. All questions and concerns answered at the bedside.   Code Status: Full Code  Past Medical History:  Diagnosis Date   Angina pectoris (HCC)    Aortic atherosclerosis (HCC)    Aortic stenosis    a.) TTE 03/14/2018: mod AS (MPG 14); b.) TTE 09/13/2019: mild AS (MPG 12.5); c.) TTE 08/16/2021: mod AS (MPG 18.5); d.) R/LHC 09/17/2021: mRA 3, mPA 19, mPCWP 11, LVEDP 15, CO 5.22, CI 2.75; e.) s/p TAVR 11/06/2021   CAD (coronary artery disease) 09/17/2021   a.) R/LHC 09/17/2021: 30% pRCA, 40%  dRCA, 50% pLCx, 30% dLCx, 50% pLAD, 50% mLAD, 50% dLAD --> med mgmt   Cardiac murmur    Cervical spondylosis    Cholelithiasis    CRAO (central retinal artery occlusion), 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   CVA (cerebral vascular accident) (HCC) 03/13/2018   a.) MRI brain 03/14/2018 --> acute infarct in the posterior RIGHT frontal lobe; small chronic cerebellar infarct --> Tx'd with TPA with (+) improvement/resolution of symptoms   Diastolic dysfunction    a.) TTE 01/31/2015: EF 55-60%, MAC, G1DD; b.) TTE 03/14/2018: EF 60-65%, mild LAE, sev MAC, sev AoV cal with mod AS (MPG 14) G2DD; c.) TTE 09/13/2019: EF 55-60%, mild LAE, mild-mod MR, mild AS (MPG 12.5), G2DD; d.) TTE 08/16/2021: EF 60-65%, mild LVH, mod MAC, mild MR, mod AS (MPG 18.5), G1DD; e.) TTE 12/05/2021: EF 60-65%, mod MAC, s/p TAVR (well seated/functioning valve), mild MR, G1DD   GERD (gastroesophageal reflux disease)    History of chicken pox    History of measles    Hyperlipidemia    Hypertension    LBBB (left bundle branch block)    Long term current use of anticoagulant    a.) apixaban    Low back pain radiating to left lower extremity    PAF (paroxysmal atrial fibrillation) (HCC)    a.) CHA2DS2VASc = 6 (age x2, HTN, CVA x2, vascular disease history);  b.) rate/rhythm maintained on oral metoprolol  succinate; chronically anticoagulated with apixaban    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   a.) 26mm S3UR via RIGHT TF approach   Skin cancer     Past Surgical History:  Procedure Laterality Date   INTRAOPERATIVE TRANSTHORACIC ECHOCARDIOGRAM N/A 11/06/2021  Procedure: INTRAOPERATIVE TRANSTHORACIC ECHOCARDIOGRAM;  Surgeon: Verlin Lonni BIRCH, MD;  Location: [facility] OR;  Service: Open Heart Surgery;  Laterality: N/A;   RIGHT/LEFT HEART CATH AND CORONARY ANGIOGRAPHY N/A 09/17/2021   Procedure: RIGHT/LEFT HEART CATH AND CORONARY ANGIOGRAPHY;  Surgeon: Verlin Lonni BIRCH, MD;  Location: [facility] INVASIVE CV LAB;  Service:  Cardiovascular;  Laterality: N/A;   ROBOTIC ASSISTED LAPAROSCOPIC CHOLECYSTECTOMY  07/24/2022   SKIN CANCER EXCISION  07/2020   TRANSCATHETER AORTIC VALVE REPLACEMENT, TRANSFEMORAL N/A 11/06/2021   Procedure: Transcatheter Aortic Valve Replacement, Transfemoral;  Surgeon: Verlin Lonni BIRCH, MD;  Location: [facility] OR;  Service: Open Heart Surgery;  Laterality: N/A;    Allergies: Patient has no known allergies.  Medications: Prior to Admission medications   Medication Sig Start Date End Date Taking? Authorizing Provider  amLODipine  (NORVASC ) 10 MG tablet Take 1 tablet (10 mg total) by mouth daily. 04/25/23   Furth, Cadence H, PA-C  amoxicillin  (AMOXIL ) 500 MG tablet Take 4 tablets (2,000 mg total) by mouth as directed. 1 hour prior to dental work including cleanings Patient not taking: Reported on 07/14/2023 11/15/22   Sebastian Lamarr SAUNDERS, PA-C  apixaban  (ELIQUIS ) 5 MG TABS tablet TAKE ONE (1) TABLET BY MOUTH TWO TIMES PER DAYTAKE ONE (1) TABLET BY MOUTH TWO TIMES PER DAY, may resume on Friday AM, June 28. 07/24/22   Lane Shope, MD  atorvastatin  (LIPITOR) 80 MG tablet TAKE ONE TABLET BY MOUTH ONCE DAILY 04/28/23   Sethi, Pramod S, MD  Cyanocobalamin (B-12) 3000 MCG CAPS Take 3,000 mcg by mouth daily as needed (energy). Patient not taking: Reported on 07/14/2023    [provider]  ibuprofen  (ADVIL ) 800 MG tablet Take 1 tablet (800 mg total) by mouth every 8 (eight) hours as needed. 07/24/22   Lane Shope, MD  metoprolol  succinate (TOPROL -XL) 25 MG 24 hr tablet TAKE ONE AND ONE-HALF TABLETS DAILY 06/02/23   Furth, Cadence H, PA-C  ondansetron  (ZOFRAN ) 4 MG tablet Take 1 tablet (4 mg total) by mouth every 8 (eight) hours as needed. 07/10/23 07/09/24  Malvina Alm DASEN, MD  oxyCODONE  (ROXICODONE ) 5 MG immediate release tablet Take 1 tablet (5 mg total) by mouth every 8 (eight) hours as needed. 07/10/23 07/09/24  Malvina Alm DASEN, MD  senna-docusate (SENOKOT-S) 8.6-50 MG tablet Take 1 tablet by  mouth daily. 07/10/23   Malvina Alm DASEN, MD     Family History  Problem Relation Age of Onset   Arrhythmia Mother        A-Fib   Lung cancer Father    Diabetes Son    Colon cancer Neg Hx    Prostate cancer Neg Hx     Social History   Socioeconomic History   Marital status: Married    Spouse name: [name]   Number of children: 2   Years of education: Not on file   Highest education level: 12th grade  Occupational History   Occupation: Retired   Occupation: Retired [employer]  Tobacco Use   Smoking status: Former    Current packs/day: 0.00    Average packs/day: 1 pack/day for 20.0 years (20.0 ttl pk-yrs)    Types: Cigarettes    Start date: 03/12/1963    Quit date: 03/12/1983    Years since quitting: 40.3    Passive exposure: Past   Smokeless tobacco: Never  Vaping Use   Vaping status: Never Used  Substance and Sexual Activity   Alcohol use: No   Drug use: No   Sexual activity: Not on  file  Other Topics Concern   Not on file  Social History Narrative   Not on file   Social Drivers of Health   Financial Resource Strain: Low Risk  (07/06/2023)   Overall Financial Resource Strain (CARDIA)    Difficulty of Paying Living Expenses: Not hard at all  Food Insecurity: No Food Insecurity (07/06/2023)   Hunger Vital Sign    Worried About Running Out of Food in the Last Year: Never true    Ran Out of Food in the Last Year: Never true  Transportation Needs: No Transportation Needs (07/06/2023)   PRAPARE - Administrator, Civil Service (Medical): No    Lack of Transportation (Non-Medical): No  Physical Activity: Sufficiently Active (07/06/2023)   Exercise Vital Sign    Days of Exercise per Week: 3 days    Minutes of Exercise per Session: 50 min  Stress: No Stress Concern Present (07/06/2023)   [employer] of Occupational Health - Occupational Stress Questionnaire    Feeling of Stress : Not at all  Social Connections: Socially Integrated (07/06/2023)   Social  Connection and Isolation Panel    Frequency of Communication with Friends and Family: More than three times a week    Frequency of Social Gatherings with Friends and Family: More than three times a week    Attends Religious Services: More than 4 times per year    Active Member of [employer] or Organizations: Yes    Attends Engineer, structural: More than 4 times per year    Marital Status: Married    Review of Systems  Respiratory:  Positive for shortness of breath.   Gastrointestinal:  Positive for abdominal distention (minimal) and abdominal pain (Intermittent).  Denies any N/V, chest pain, fevers/chills. All other ROS negative.  Vital Signs: BP 122/72   Pulse 84   Temp 98 F (36.7 C) (Oral)   Resp 20   Ht 5' 5 (1.651 m)   Wt 167 lb 1.6 oz (75.8 kg)   SpO2 97%   BMI 27.81 kg/m    Physical Exam Vitals reviewed.  Constitutional:      Appearance: Normal appearance.  HENT:     Head: Normocephalic and atraumatic.     Mouth/Throat:     Mouth: Mucous membranes are moist.     Pharynx: Oropharynx is clear.   Cardiovascular:     Rate and Rhythm: Normal rate and regular rhythm.     Heart sounds: Normal heart sounds.  Pulmonary:     Effort: Pulmonary effort is normal.     Breath sounds: Normal breath sounds.  Abdominal:     General: Abdomen is flat. There is distension (minimal).     Palpations: Abdomen is soft.     Tenderness: There is no abdominal tenderness.   Musculoskeletal:        General: Normal range of motion.     Cervical back: Normal range of motion.   Skin:    General: Skin is warm and dry.   Neurological:     General: No focal deficit present.     Mental Status: He is alert and oriented to person, place, and time. Mental status is at baseline.   Psychiatric:        Mood and Affect: Mood normal.        Behavior: Behavior normal.        Judgment: Judgment normal.     Imaging: NM PET Image Initial (PI) Skull Base To Thigh Result Date:  07/15/2023 CLINICAL DATA:  Initial treatment strategy for liver masses. Peritoneal carcinomatosis. EXAM: NUCLEAR MEDICINE PET SKULL BASE TO THIGH TECHNIQUE: 9.7 mCi F-18 FDG was injected intravenously. Full-ring PET imaging was performed from the skull base to thigh after the radiotracer. CT data was obtained and used for attenuation correction and anatomic localization. Fasting blood glucose: 101 mg/dl COMPARISON:  Abdomen pelvis CT with contrast 07/10/2023 FINDINGS: Mediastinal blood pool activity: SUV max 2.5 Liver activity: SUV max 2.6 NECK: No specific abnormal uptake in the neck including along lymph node change of the submandibular, posterior triangle or internal jugular region. Near symmetric uptake of the visualized intracranial compartment. Incidental CT findings: The parotid glands, submandibular glands unremarkable. Small thyroid  gland. Paranasal sinuses and mastoid air cells are clear. Nasal septal deviation. Streak artifact related to the patient's dental hardware. Scattered vascular calcifications. CHEST: No abnormal uptake above blood pool in the axillary regions, hilum or mediastinum. Mild uptake along the lower esophagus, nonspecific. There is a small hiatal hernia. No abnormal uptake along the lung parenchyma. Incidental CT findings: Status post TAVR. No pericardial effusion. Borderline size heart. Scattered vascular calcifications are seen including along the coronary arteries and great vessels. Possible areas of significant stenosis along the subclavian arteries bilaterally. Please correlate with any symptoms and if needed dedicated CTA as clinically appropriate. Breathing motion. There is some linear opacity identified dependently along the lungs likely scar or atelectasis. The there are some small pre cardiophrenic nodes on image 63 which are under 5 mm in short axis. No abnormal uptake. ABDOMEN/PELVIS: Numerous hypermetabolic liver masses are identified. At least 30 lesions are identified.  The majority are small under 2 cm. Example 0 1 larger foci inferior right hepatic lobe measures 2.1 x 1.8 cm on image 80 of the CT scan and has maximum SUV of 8.5. Larger focus in segment 4A has maximum SUV of 7.7 and on image 67 is anterior lesion measures 2.8 by 1.9 cm. Distribution on PET images show some lesions being target like. All segments are affected. In addition peritoneal carcinomatosis identified with nodular soft tissue seen anteriorly which is all hypermetabolic. Example on the right side of the abdomen has maximum SUV of 9.3 on image 102. Left side on the same image has maximum SUV 9.5. Otherwise there is physiologic distribution of tracer along other parenchymal organs, renal collecting systems and bowel. There is some scattered ascites identified. Few prominent nodes identified right upper quadrant. Example node anterior to the head of the pancreas on image 81 measures 10 by 19 mm. This stone has low-level uptake however maximum SUV of 3.2. Attention on follow-up. Few other nodes as well similar. Incidental CT findings: Diffuse vascular calcifications identified. Normal caliber aorta and IVC. No renal or ureteral stones are identified. Underdistended urinary bladder. Prominent prostate without uptake. Grossly the spleen, pancreas and adrenal glands are preserved. Liver margin is slightly nodular. This could be pseudo cirrhosis. SKELETON: No abnormal uptake along the visualized osseous structures. Incidental CT findings: Scattered degenerative changes. 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 In addition there are numerous hypermetabolic liver lesions identified. Appearance is somewhat target like by PET-CT. Few prominent upper abdominal lymph nodes identified with some minimal uptake. Attention on follow-up. No areas of abnormal uptake seen above the diaphragm. Small hiatal hernia. Diffuse vascular calcifications. Status  post TAVR. Electronically Signed   By: Ranell Bring M.D.   On: 07/15/2023 10:21   CT ABDOMEN PELVIS W CONTRAST  Result Date: 07/10/2023 CLINICAL DATA:  Right upper quadrant abdominal pain EXAM: CT ABDOMEN AND PELVIS WITHOUT CONTRAST TECHNIQUE: Multidetector CT imaging of the abdomen and pelvis was performed following the standard protocol without IV contrast. RADIATION DOSE REDUCTION: This exam was performed according to the departmental dose-optimization program which includes automated exposure control, adjustment of the mA and/or kV according to patient size and/or use of iterative reconstruction technique. COMPARISON:  CT abdomen and pelvis September 28, 2021 and right upper quadrant abdominal ultrasound May 24, 2022 FINDINGS: Lower chest: No infiltrates or consolidations, no pleural effusions Hepatobiliary: The liver demonstrates multiple inhomogeneous lesions throughout the right and left lobe of the liver measuring from a few mm up to several cm largest in the right lobe of the liver 2.3 cm along the superior margin and 2.2 cm in segment 5. Within the left lobe of the liver segment 4A and 4B multiple lesions measuring between 1 and 2 cm in diameter findings are highly suggestive of metastatic liver disease. Prior cholecystectomy. No biliary dilatation. There is a small amount of ascites abdomen and pelvis with diffuse nodularity of the mesentery findings that are highly consistent with peritoneal carcinomatosis. Pancreas: Pancreas normal size. No masses calcifications or inflammatory changes. Spleen: Spleen normal size.  No masses. Adrenals/Urinary Tract: Adrenal glands are normal size. Follow-up recommended. Kidneys are normal. No masses calcifications or hydronephrosis Stomach/Bowel: No small or large bowel obstruction or inflammatory changes. Moderate amount of residual fecal material throughout the colon without obstruction or constipation. Vascular/Lymphatic: No significant vascular findings are  present. No enlarged abdominal or pelvic lymph nodes. Reproductive: .  No masses. Bladder unremarkable. Other: Anterior abdominal wall unremarkable without evidence of umbilical or inguinal hernias Musculoskeletal: Visualized portion of the thoracolumbar spine and pelvic structures grossly unremarkable without evidence of fracture bony abnormalities or soft tissue masses. IMPRESSION: *Multiple inhomogeneous lesions throughout the right and left lobe of the liver highly suggestive of metastatic liver disease. *Small amount of ascites abdomen and pelvis with diffuse nodularity of the mesentery findings that are highly consistent with peritoneal carcinomatosis. *Moderate amount of residual fecal material throughout the colon without obstruction or constipation. Electronically Signed   By: Franky Chard M.D.   On: 07/10/2023 14:37   DG Chest Portable 1 View Result Date: 07/10/2023 CLINICAL DATA:  Shortness of breath. EXAM: PORTABLE CHEST 1 VIEW COMPARISON:  Chest radiograph dated 11/02/2021. FINDINGS: No focal consolidation, pleural effusion or pneumothorax. The cardiac silhouette is within normal limits. Aortic valve repair. Atherosclerotic calcification of the aorta. No acute osseous pathology. IMPRESSION: No active disease. Electronically Signed   By: Vanetta Chou M.D.   On: 07/10/2023 12:37    Labs:  CBC: Recent Labs    04/25/23 0823 07/07/23 0858 07/10/23 1121  WBC 8.0 13.0* 12.6*  HGB 14.1 11.5* 11.0*  HCT 43.0 34.6* 33.8*  PLT 230 332 334    COAGS: Recent Labs    07/10/23 1517 07/24/23 0738  INR 1.4* 1.1  APTT 42*  --     BMP: Recent Labs    01/02/23 1016 07/07/23 0858 07/10/23 1121  NA 141 136 137  K 4.9 4.1 3.9  CL 105 102 104  CO2 21 18* 22  GLUCOSE 102* 120* 151*  BUN 14 15 15   CALCIUM  9.5 9.2 8.9  CREATININE 1.22 1.00 1.06  GFRNONAA  --   --  >60    LIVER FUNCTION TESTS: Recent Labs    07/07/23 0858 07/10/23 1121  BILITOT 0.7 0.9  AST 97* 70*  ALT 58*  52*  ALKPHOS 228* 162*  PROT 6.8 7.1  ALBUMIN 4.0 3.2*    TUMOR MARKERS: No results for input(s): AFPTM, CEA, CA199, CHROMGRNA in the last 8760 hours.  Assessment and Plan:  Peritoneal Carcinomatosis: Jon Mejia is a 83 y.o. male with a history of aortic stenosis s/p TAVR and recently worsening abdominal pain who presents to [facility] Interventional Radiology department for an image-guided abdominal mass biopsy with Dr. KANDICE Moan. Procedure to be performed under moderate sedation.  -NPO since midnight -Eliquis  held since 6/23 -INR 1.1 this morning -Plan for CT guided abdominal mass biopsy with Dr. KANDICE Moan  Risks and benefits of abdominal mass biopsy was discussed with the patient and/or patient's family including, but not limited to bleeding, infection, damage to adjacent structures or low yield requiring additional tests.  All of the questions were answered and there is agreement to proceed.  Consent signed and in chart.   Thank you for this interesting consult. I greatly enjoyed meeting Jon Mejia and look forward to participating in their care. A copy of this report was sent to the requesting provider on this date.  Electronically Signed: Glennon CHRISTELLA Bal, PA-C 07/24/2023, 8:30 AM   I spent a total of  40 Minutes in face to face clinical consultation, greater than 50% of which was counseling/coordinating care for abdominal mass biopsy.

## 2023-07-23 ENCOUNTER — Other Ambulatory Visit: Payer: Self-pay | Admitting: Diagnostic Radiology

## 2023-07-23 ENCOUNTER — Other Ambulatory Visit: Payer: Self-pay | Admitting: Radiology

## 2023-07-23 DIAGNOSIS — C786 Secondary malignant neoplasm of retroperitoneum and peritoneum: Secondary | ICD-10-CM

## 2023-07-23 DIAGNOSIS — Z01818 Encounter for other preprocedural examination: Secondary | ICD-10-CM

## 2023-07-23 NOTE — Progress Notes (Signed)
 Patient for CT guided peritoneal mass biopsy on Thurs 07/24/23, I called and spoke with the patient's wife, [name] on the phone and gave pre-procedure instructions. [name] was made aware to have the patient here at 7:30a, last dose of Eliquis  was on Mon 07/21/23, NPO after MN prior to procedure as well as driver post procedure/recovery/discharge. [name] stated understanding.  Called 07/23/23

## 2023-07-24 ENCOUNTER — Ambulatory Visit
Admission: RE | Admit: 2023-07-24 | Discharge: 2023-07-24 | Disposition: A | Discharge status: Home / Self Care | Source: Ambulatory Visit | Attending: Oncology | Admitting: Oncology

## 2023-07-24 ENCOUNTER — Other Ambulatory Visit: Payer: Self-pay

## 2023-07-24 DIAGNOSIS — C786 Secondary malignant neoplasm of retroperitoneum and peritoneum: Secondary | ICD-10-CM | POA: Diagnosis not present

## 2023-07-24 DIAGNOSIS — Z01818 Encounter for other preprocedural examination: Secondary | ICD-10-CM | POA: Diagnosis not present

## 2023-07-24 DIAGNOSIS — Z7901 Long term (current) use of anticoagulants: Secondary | ICD-10-CM | POA: Insufficient documentation

## 2023-07-24 DIAGNOSIS — K668 Other specified disorders of peritoneum: Secondary | ICD-10-CM | POA: Diagnosis not present

## 2023-07-24 DIAGNOSIS — R1900 Intra-abdominal and pelvic swelling, mass and lump, unspecified site: Secondary | ICD-10-CM | POA: Diagnosis not present

## 2023-07-24 DIAGNOSIS — Z952 Presence of prosthetic heart valve: Secondary | ICD-10-CM | POA: Diagnosis not present

## 2023-07-24 DIAGNOSIS — C7989 Secondary malignant neoplasm of other specified sites: Secondary | ICD-10-CM | POA: Diagnosis not present

## 2023-07-24 DIAGNOSIS — R160 Hepatomegaly, not elsewhere classified: Secondary | ICD-10-CM

## 2023-07-24 DIAGNOSIS — K769 Liver disease, unspecified: Secondary | ICD-10-CM | POA: Diagnosis not present

## 2023-07-24 DIAGNOSIS — C801 Malignant (primary) neoplasm, unspecified: Secondary | ICD-10-CM | POA: Diagnosis not present

## 2023-07-24 LAB — CBC
HCT: 28.9 % — ABNORMAL LOW (ref 39.0–52.0)
Hemoglobin: 9.3 g/dL — ABNORMAL LOW (ref 13.0–17.0)
MCH: 27.8 pg (ref 26.0–34.0)
MCHC: 32.2 g/dL (ref 30.0–36.0)
MCV: 86.3 fL (ref 80.0–100.0)
Platelets: 358 10*3/uL (ref 150–400)
RBC: 3.35 MIL/uL — ABNORMAL LOW (ref 4.22–5.81)
RDW: 13.8 % (ref 11.5–15.5)
WBC: 12.1 10*3/uL — ABNORMAL HIGH (ref 4.0–10.5)
nRBC: 0 % (ref 0.0–0.2)

## 2023-07-24 LAB — PROTIME-INR
INR: 1.1 (ref 0.8–1.2)
Prothrombin Time: 15 s (ref 11.4–15.2)

## 2023-07-24 MED ORDER — SODIUM CHLORIDE 0.9 % IV SOLN: INTRAVENOUS | Status: DC

## 2023-07-24 MED ORDER — FENTANYL CITRATE (PF) 100 MCG/2ML IJ SOLN
INTRAMUSCULAR | Status: AC
  Filled 2023-07-24: qty 2

## 2023-07-24 MED ORDER — FENTANYL CITRATE (PF) 100 MCG/2ML IJ SOLN
INTRAMUSCULAR | Status: AC | PRN
  Administered 2023-07-24 (×2): 50 ug via INTRAVENOUS

## 2023-07-24 MED ORDER — MIDAZOLAM HCL 2 MG/2ML IJ SOLN
INTRAMUSCULAR | Status: AC | PRN
  Administered 2023-07-24: 1 mg via INTRAVENOUS

## 2023-07-24 MED ORDER — MIDAZOLAM HCL 2 MG/2ML IJ SOLN
INTRAMUSCULAR | Status: AC
  Filled 2023-07-24: qty 2

## 2023-07-24 MED ORDER — LIDOCAINE HCL (PF) 1 % IJ SOLN
10.0000 mL | Freq: Once | INTRAMUSCULAR | Status: AC
  Administered 2023-07-24: 10 mL via INTRADERMAL
  Filled 2023-07-24: qty 10

## 2023-07-24 NOTE — Progress Notes (Signed)
 Patient clinically stable post CT Abdominal mass biopsy per Dr Luverne, tolerated well. Vitals stable pre and post procedure. Received Versed  1 mg along with Fentanyl  100 mcg IV for procedure. Report given to Paige Johnson Rn post procedure;/specials/14

## 2023-07-24 NOTE — Procedures (Signed)
 Interventional Radiology Procedure Note  Procedure: CT Guided Biopsy of peritoneal tumor  Complications: None  Estimated Blood Loss: < 10 mL  Findings: 18 G core biopsy of left anterior peritoneal tumor performed under CT guidance.  Three core samples obtained and sent to Pathology.  Jon Mejia. Luverne, M.D Pager:  [phone]

## 2023-07-25 ENCOUNTER — Inpatient Hospital Stay (HOSPITAL_BASED_OUTPATIENT_CLINIC_OR_DEPARTMENT_OTHER): Admitting: Hospice and Palliative Medicine

## 2023-07-25 ENCOUNTER — Other Ambulatory Visit: Payer: Self-pay

## 2023-07-25 ENCOUNTER — Encounter: Payer: Self-pay | Admitting: Hospice and Palliative Medicine

## 2023-07-25 ENCOUNTER — Inpatient Hospital Stay

## 2023-07-25 ENCOUNTER — Telehealth: Payer: Self-pay

## 2023-07-25 VITALS — BP 126/72 | HR 80 | Temp 98.0°F | Resp 16 | Wt 171.0 lb

## 2023-07-25 DIAGNOSIS — R509 Fever, unspecified: Secondary | ICD-10-CM

## 2023-07-25 DIAGNOSIS — R160 Hepatomegaly, not elsewhere classified: Secondary | ICD-10-CM

## 2023-07-25 DIAGNOSIS — G893 Neoplasm related pain (acute) (chronic): Secondary | ICD-10-CM

## 2023-07-25 DIAGNOSIS — C787 Secondary malignant neoplasm of liver and intrahepatic bile duct: Secondary | ICD-10-CM | POA: Diagnosis not present

## 2023-07-25 LAB — CBC WITH DIFFERENTIAL/PLATELET
Abs Immature Granulocytes: 0.09 10*3/uL — ABNORMAL HIGH (ref 0.00–0.07)
Basophils Absolute: 0.1 10*3/uL (ref 0.0–0.1)
Basophils Relative: 0 %
Eosinophils Absolute: 0.1 10*3/uL (ref 0.0–0.5)
Eosinophils Relative: 1 %
HCT: 29.5 % — ABNORMAL LOW (ref 39.0–52.0)
Hemoglobin: 9.6 g/dL — ABNORMAL LOW (ref 13.0–17.0)
Immature Granulocytes: 1 %
Lymphocytes Relative: 14 %
Lymphs Abs: 2.2 10*3/uL (ref 0.7–4.0)
MCH: 28.1 pg (ref 26.0–34.0)
MCHC: 32.5 g/dL (ref 30.0–36.0)
MCV: 86.3 fL (ref 80.0–100.0)
Monocytes Absolute: 2 10*3/uL — ABNORMAL HIGH (ref 0.1–1.0)
Monocytes Relative: 13 %
Neutro Abs: 11.2 10*3/uL — ABNORMAL HIGH (ref 1.7–7.7)
Neutrophils Relative %: 71 %
Platelets: 321 10*3/uL (ref 150–400)
RBC: 3.42 MIL/uL — ABNORMAL LOW (ref 4.22–5.81)
RDW: 13.7 % (ref 11.5–15.5)
WBC: 15.6 10*3/uL — ABNORMAL HIGH (ref 4.0–10.5)
nRBC: 0 % (ref 0.0–0.2)

## 2023-07-25 LAB — COMPREHENSIVE METABOLIC PANEL WITH GFR
ALT: 28 U/L (ref 0–44)
AST: 45 U/L — ABNORMAL HIGH (ref 15–41)
Albumin: 3.2 g/dL — ABNORMAL LOW (ref 3.5–5.0)
Alkaline Phosphatase: 150 U/L — ABNORMAL HIGH (ref 38–126)
Anion gap: 8 (ref 5–15)
BUN: 17 mg/dL (ref 8–23)
CO2: 22 mmol/L (ref 22–32)
Calcium: 8.7 mg/dL — ABNORMAL LOW (ref 8.9–10.3)
Chloride: 101 mmol/L (ref 98–111)
Creatinine, Ser: 0.96 mg/dL (ref 0.61–1.24)
GFR, Estimated: >60 mL/min (ref >60)
Glucose, Bld: 109 mg/dL — ABNORMAL HIGH (ref 70–99)
Potassium: 4.2 mmol/L (ref 3.5–5.1)
Sodium: 131 mmol/L — ABNORMAL LOW (ref 135–145)
Total Bilirubin: 1.3 mg/dL — ABNORMAL HIGH (ref 0.0–1.2)
Total Protein: 7.1 g/dL (ref 6.5–8.1)

## 2023-07-25 MED ORDER — OXYCODONE HCL 5 MG PO TABS: 5.0000 mg | ORAL_TABLET | Freq: Four times a day (QID) | ORAL | 0 refills | Status: DC | PRN

## 2023-07-25 MED ORDER — AMOXICILLIN-POT CLAVULANATE 875-125 MG PO TABS: 1.0000 | ORAL_TABLET | Freq: Two times a day (BID) | ORAL | 0 refills | Status: DC

## 2023-07-25 NOTE — Telephone Encounter (Signed)
 Daughter stated patient had biopsy yesterday and was running a fever last night. Reports no other symptoms. Asking for call back at [phone]

## 2023-07-25 NOTE — Progress Notes (Signed)
 Symptom Management Clinic [facility] [phone] [phone]  Patient Care Team: Gasper Nancyann BRAVO, MD as PCP - General (Family Medicine) Perla Evalene PARAS, MD as PCP - Cardiology (Cardiology) Portia Fireman, OD as Consulting Physician (Optometry) Perla, Evalene PARAS, MD as Consulting Physician (Cardiology) Maurie Rayfield BIRCH, RN as Oncology Nurse Navigator   NAME OF PATIENT: Jon Mejia  [id number]  May 03, 1940   DATE OF VISIT: 07/25/23  REASON FOR CONSULT: Jon Mejia is a 83 y.o. 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   INTERVAL HISTORY: Patient underwent liver biopsy yesterday.  He presents [facility] today for evaluation of fever.  Patient reports that several hours after his biopsy, patient developed fever and chills.  Tmax overnight was 100.1.  Family gave him acetaminophen .  Last dose of acetaminophen  was 3 hours ago and family reports that oral temp was 99.0.  Patient denies respiratory or GI symptoms.  Denies dysuria, urinary frequency or urgency.  Denies any neurologic complaints. Denies any easy bleeding or bruising. Reports poor appetite. Denies chest pain. Denies any nausea, vomiting, or diarrhea.  Has mild constipation.  Denies urinary complaints. Patient offers no further specific complaints today.   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   CAD (coronary artery disease) 09/17/2021   a.) R/LHC 09/17/2021: 30% pRCA, 40% dRCA, 50% pLCx, 30% dLCx, 50% pLAD, 50% mLAD, 50% dLAD --> med mgmt   Cardiac murmur    Cervical  spondylosis    Cholelithiasis    CRAO (central retinal artery occlusion), 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   CVA (cerebral vascular accident) (HCC) 03/13/2018   a.) MRI brain 03/14/2018 --> acute infarct in the posterior RIGHT frontal lobe; small chronic cerebellar infarct --> Tx'd with TPA with (+) improvement/resolution of symptoms   Diastolic dysfunction    a.) TTE 01/31/2015: EF 55-60%, MAC, G1DD; b.) TTE 03/14/2018: EF 60-65%, mild LAE, sev MAC, sev AoV cal with mod AS (MPG 14) G2DD; c.) TTE 09/13/2019: EF 55-60%, mild LAE, mild-mod MR, mild AS (MPG 12.5), G2DD; d.) TTE 08/16/2021: EF 60-65%, mild LVH, mod MAC, mild MR, mod AS (MPG 18.5), G1DD; e.) TTE 12/05/2021: EF 60-65%, mod MAC, s/p TAVR (well seated/functioning valve), mild MR, G1DD   GERD (gastroesophageal reflux disease)    History of chicken pox    History of measles    Hyperlipidemia    Hypertension    LBBB (left bundle branch block)    Long term current use of anticoagulant    a.) apixaban    Low back pain radiating to left lower extremity    PAF (paroxysmal atrial fibrillation) (HCC)    a.) CHA2DS2VASc = 6 (age x2, HTN, CVA x2, vascular disease history);  b.) rate/rhythm maintained on oral metoprolol  succinate; chronically anticoagulated with apixaban    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   a.) 26mm S3UR via RIGHT TF approach   Skin cancer     PAST SURGICAL HISTORY:  Past Surgical History:  Procedure Laterality Date   INTRAOPERATIVE TRANSTHORACIC ECHOCARDIOGRAM N/A 11/06/2021   Procedure: INTRAOPERATIVE TRANSTHORACIC ECHOCARDIOGRAM;  Surgeon: Verlin Lonni BIRCH, MD;  Location: [facility] OR;  Service: Open Heart Surgery;  Laterality: N/A;   RIGHT/LEFT HEART CATH AND CORONARY ANGIOGRAPHY N/A 09/17/2021   Procedure: RIGHT/LEFT HEART CATH AND CORONARY ANGIOGRAPHY;  Surgeon: Verlin Lonni BIRCH, MD;  Location: [facility] INVASIVE CV LAB;  Service: Cardiovascular;  Laterality: N/A;   ROBOTIC ASSISTED LAPAROSCOPIC  CHOLECYSTECTOMY  07/24/2022   SKIN CANCER EXCISION  07/2020   TRANSCATHETER AORTIC VALVE REPLACEMENT, TRANSFEMORAL N/A 11/06/2021   Procedure: Transcatheter Aortic Valve Replacement, Transfemoral;  Surgeon: Verlin Lonni BIRCH, MD;  Location: [facility] OR;  Service: Open Heart Surgery;  Laterality: N/A;    HEMATOLOGY/ONCOLOGY HISTORY:  Oncology History   No history exists.    ALLERGIES:  has no known allergies.  MEDICATIONS:  Current Outpatient Medications  Medication Sig Dispense Refill   amLODipine  (NORVASC ) 10 MG tablet Take 1 tablet (10 mg total) by mouth daily. 90 tablet 3   apixaban  (ELIQUIS ) 5 MG TABS tablet TAKE ONE (1) TABLET BY MOUTH TWO TIMES PER DAYTAKE ONE (1) TABLET BY MOUTH TWO TIMES PER DAY, may resume on Friday AM, June 28. 180 tablet 1   atorvastatin  (LIPITOR) 80 MG tablet TAKE ONE TABLET BY MOUTH ONCE DAILY 30 tablet 3   ibuprofen  (ADVIL ) 800 MG tablet Take 1 tablet (800 mg total) by mouth every 8 (eight) hours as needed. 30 tablet 0   metoprolol  succinate (TOPROL -XL) 25 MG 24 hr tablet TAKE ONE AND ONE-HALF TABLETS DAILY 45 tablet 3   ondansetron  (ZOFRAN ) 4 MG tablet Take 1 tablet (4 mg total) by mouth every 8 (eight) hours as needed. 15 tablet 0   oxyCODONE  (ROXICODONE ) 5 MG immediate release tablet Take 1 tablet (5 mg total) by mouth every 8 (eight) hours as needed. 10 tablet 0   senna-docusate (SENOKOT-S) 8.6-50 MG tablet Take 1 tablet by mouth daily. 30 tablet 0   amoxicillin  (AMOXIL ) 500 MG tablet Take 4 tablets (2,000 mg total) by mouth as directed. 1 hour prior to dental work including cleanings (Patient not taking: Reported on 07/25/2023) 12 tablet 12   Cyanocobalamin (B-12) 3000 MCG CAPS Take 3,000 mcg by mouth daily as needed (energy). (Patient not taking: Reported on 07/25/2023)     No current facility-administered medications for this visit.    VITAL SIGNS: There were no vitals taken for this visit. There were no vitals filed for this visit.  Estimated body  mass index is 27.81 kg/m as calculated from the following:   Height as of 07/24/23: 5' 5 (1.651 m).   Weight as of 07/24/23: 167 lb 1.6 oz (75.8 kg).  LABS: CBC:    Component Value Date/Time   WBC 15.6 (H) 07/25/2023 1053   HGB 9.6 (L) 07/25/2023 1053   HGB 11.5 (L) 07/07/2023 0858   HCT 29.5 (L) 07/25/2023 1053   HCT 34.6 (L) 07/07/2023 0858   PLT 321 07/25/2023 1053   PLT 332 07/07/2023 0858   MCV 86.3 07/25/2023 1053   MCV 87 07/07/2023 0858   NEUTROABS 11.2 (H) 07/25/2023 1053   LYMPHSABS 2.2 07/25/2023 1053   MONOABS 2.0 (H) 07/25/2023 1053   EOSABS 0.1 07/25/2023 1053   BASOSABS 0.1 07/25/2023 1053   Comprehensive Metabolic Panel:    Component Value Date/Time   NA 137 07/10/2023 1121   NA 136 07/07/2023 0858   K 3.9 07/10/2023 1121   CL 104 07/10/2023 1121   CO2 22 07/10/2023 1121   BUN 15 07/10/2023 1121   BUN 15 07/07/2023 0858   CREATININE 1.06 07/10/2023 1121   CREATININE  1.10 01/09/2017 0906   GLUCOSE 151 (H) 07/10/2023 1121   CALCIUM  8.9 07/10/2023 1121   AST 70 (H) 07/10/2023 1121   ALT 52 (H) 07/10/2023 1121   ALKPHOS 162 (H) 07/10/2023 1121   BILITOT 0.9 07/10/2023 1121   BILITOT 0.7 07/07/2023 0858   PROT 7.1 07/10/2023 1121   PROT 6.8 07/07/2023 0858   ALBUMIN 3.2 (L) 07/10/2023 1121   ALBUMIN 4.0 07/07/2023 0858    RADIOGRAPHIC STUDIES: CT ABDOMINAL MASS BIOPSY Result Date: 07/24/2023 CLINICAL DATA:  Multiple liver metastases and diffuse peritoneal and omental tumor with no known primary carcinoma. The patient presents for tissue biopsy. EXAM: CT GUIDED CORE BIOPSY OF PERITONEAL MASS ANESTHESIA/SEDATION: Moderate (conscious) sedation was employed during this procedure. A total of Versed  1.0 mg and Fentanyl  100 mcg was administered intravenously. Moderate Sedation Time: 15 minutes. The patient's level of consciousness and vital signs were monitored continuously by radiology nursing throughout the procedure under my direct supervision. PROCEDURE: The  procedure risks, benefits, and alternatives were explained to the patient. Questions regarding the procedure were encouraged and answered. The patient understands and consents to the procedure. A time out was performed prior to initiating the procedure. RADIATION DOSE REDUCTION: This exam was performed according to the departmental dose-optimization program which includes automated exposure control, adjustment of the mA and/or kV according to patient size and/or use of iterative reconstruction technique. The left abdominal wall was prepped with chlorhexidine  in a sterile fashion, and a sterile drape was applied covering the operative field. A sterile gown and sterile gloves were used for the procedure. Local anesthesia was provided with 1% Lidocaine . CT was performed in a supine position through the mid to lower abdomen. After localizing peritoneal tumor in the left lateral anterior peritoneal cavity, a 17 gauge trocar needle was advanced under CT guidance to the level of tumor mass. Three separate coaxial 18 gauge core biopsy samples were obtained and submitted in formalin. A slurry of Gel-Foam EmboCubes was then injected via the outer needle prior to its retraction and removal. Additional CT was performed. COMPLICATIONS: None FINDINGS: Bulky anterior peritoneal tumor just deep to the left lateral abdominal wall was chosen for sampling with solid tissue obtained. IMPRESSION: CT-guided core biopsy performed of peritoneal tumor in the left anterior peritoneal cavity. Electronically Signed   By: Marcey Moan M.D.   On: 07/24/2023 09:53   NM PET Image Initial (PI) Skull Base To Thigh Result Date: 07/15/2023 CLINICAL DATA:  Initial treatment strategy for liver masses. Peritoneal carcinomatosis. EXAM: NUCLEAR MEDICINE PET SKULL BASE TO THIGH TECHNIQUE: 9.7 mCi F-18 FDG was injected intravenously. Full-ring PET imaging was performed from the skull base to thigh after the radiotracer. CT data was obtained and used  for attenuation correction and anatomic localization. Fasting blood glucose: 101 mg/dl COMPARISON:  Abdomen pelvis CT with contrast 07/10/2023 FINDINGS: Mediastinal blood pool activity: SUV max 2.5 Liver activity: SUV max 2.6 NECK: No specific abnormal uptake in the neck including along lymph node change of the submandibular, posterior triangle or internal jugular region. Near symmetric uptake of the visualized intracranial compartment. Incidental CT findings: The parotid glands, submandibular glands unremarkable. Small thyroid  gland. Paranasal sinuses and mastoid air cells are clear. Nasal septal deviation. Streak artifact related to the patient's dental hardware. Scattered vascular calcifications. CHEST: No abnormal uptake above blood pool in the axillary regions, hilum or mediastinum. Mild uptake along the lower esophagus, nonspecific. There is a small hiatal hernia. No abnormal uptake along the lung parenchyma. Incidental CT findings:  Status post TAVR. No pericardial effusion. Borderline size heart. Scattered vascular calcifications are seen including along the coronary arteries and great vessels. Possible areas of significant stenosis along the subclavian arteries bilaterally. Please correlate with any symptoms and if needed dedicated CTA as clinically appropriate. Breathing motion. There is some linear opacity identified dependently along the lungs likely scar or atelectasis. The there are some small pre cardiophrenic nodes on image 63 which are under 5 mm in short axis. No abnormal uptake. ABDOMEN/PELVIS: Numerous hypermetabolic liver masses are identified. At least 30 lesions are identified. The majority are small under 2 cm. Example 0 1 larger foci inferior right hepatic lobe measures 2.1 x 1.8 cm on image 80 of the CT scan and has maximum SUV of 8.5. Larger focus in segment 4A has maximum SUV of 7.7 and on image 67 is anterior lesion measures 2.8 by 1.9 cm. Distribution on PET images show some lesions  being target like. All segments are affected. In addition peritoneal carcinomatosis identified with nodular soft tissue seen anteriorly which is all hypermetabolic. Example on the right side of the abdomen has maximum SUV of 9.3 on image 102. Left side on the same image has maximum SUV 9.5. Otherwise there is physiologic distribution of tracer along other parenchymal organs, renal collecting systems and bowel. There is some scattered ascites identified. Few prominent nodes identified right upper quadrant. Example node anterior to the head of the pancreas on image 81 measures 10 by 19 mm. This stone has low-level uptake however maximum SUV of 3.2. Attention on follow-up. Few other nodes as well similar. Incidental CT findings: Diffuse vascular calcifications identified. Normal caliber aorta and IVC. No renal or ureteral stones are identified. Underdistended urinary bladder. Prominent prostate without uptake. Grossly the spleen, pancreas and adrenal glands are preserved. Liver margin is slightly nodular. This could be pseudo cirrhosis. SKELETON: No abnormal uptake along the visualized osseous structures. Incidental CT findings: Scattered degenerative changes. 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 In addition there are numerous hypermetabolic liver lesions identified. Appearance is somewhat target like by PET-CT. Few prominent upper abdominal lymph nodes identified with some minimal uptake. Attention on follow-up. No areas of abnormal uptake seen above the diaphragm. Small hiatal hernia. Diffuse vascular calcifications. Status post TAVR. Electronically Signed   By: Ranell Bring M.D.   On: 07/15/2023 10:21   CT ABDOMEN PELVIS W CONTRAST Result Date: 07/10/2023 CLINICAL DATA:  Right upper quadrant abdominal pain EXAM: CT ABDOMEN AND PELVIS WITHOUT CONTRAST TECHNIQUE: Multidetector CT imaging of the abdomen and pelvis was performed following  the standard protocol without IV contrast. RADIATION DOSE REDUCTION: This exam was performed according to the departmental dose-optimization program which includes automated exposure control, adjustment of the mA and/or kV according to patient size and/or use of iterative reconstruction technique. COMPARISON:  CT abdomen and pelvis September 28, 2021 and right upper quadrant abdominal ultrasound May 24, 2022 FINDINGS: Lower chest: No infiltrates or consolidations, no pleural effusions Hepatobiliary: The liver demonstrates multiple inhomogeneous lesions throughout the right and left lobe of the liver measuring from a few mm up to several cm largest in the right lobe of the liver 2.3 cm along the superior margin and 2.2 cm in segment 5. Within the left lobe of the liver segment 4A and 4B multiple lesions measuring between 1 and 2 cm in diameter findings are highly suggestive of metastatic liver disease. Prior cholecystectomy. No biliary dilatation. There is a small  amount of ascites abdomen and pelvis with diffuse nodularity of the mesentery findings that are highly consistent with peritoneal carcinomatosis. Pancreas: Pancreas normal size. No masses calcifications or inflammatory changes. Spleen: Spleen normal size.  No masses. Adrenals/Urinary Tract: Adrenal glands are normal size. Follow-up recommended. Kidneys are normal. No masses calcifications or hydronephrosis Stomach/Bowel: No small or large bowel obstruction or inflammatory changes. Moderate amount of residual fecal material throughout the colon without obstruction or constipation. Vascular/Lymphatic: No significant vascular findings are present. No enlarged abdominal or pelvic lymph nodes. Reproductive: .  No masses. Bladder unremarkable. Other: Anterior abdominal wall unremarkable without evidence of umbilical or inguinal hernias Musculoskeletal: Visualized portion of the thoracolumbar spine and pelvic structures grossly unremarkable without evidence of  fracture bony abnormalities or soft tissue masses. IMPRESSION: *Multiple inhomogeneous lesions throughout the right and left lobe of the liver highly suggestive of metastatic liver disease. *Small amount of ascites abdomen and pelvis with diffuse nodularity of the mesentery findings that are highly consistent with peritoneal carcinomatosis. *Moderate amount of residual fecal material throughout the colon without obstruction or constipation. Electronically Signed   By: Franky Chard M.D.   On: 07/10/2023 14:37   DG Chest Portable 1 View Result Date: 07/10/2023 CLINICAL DATA:  Shortness of breath. EXAM: PORTABLE CHEST 1 VIEW COMPARISON:  Chest radiograph dated 11/02/2021. FINDINGS: No focal consolidation, pleural effusion or pneumothorax. The cardiac silhouette is within normal limits. Aortic valve repair. Atherosclerotic calcification of the aorta. No acute osseous pathology. IMPRESSION: No active disease. Electronically Signed   By: Vanetta Chou M.D.   On: 07/10/2023 12:37    PERFORMANCE STATUS (ECOG) : 1 - Symptomatic but completely ambulatory  Review of Systems Unless otherwise noted, a complete review of systems is negative.  Physical Exam General: NAD Cardiovascular: regular rate and rhythm Pulmonary: clear anterior/posterior fields Abdomen: soft, nontender, + bowel sounds GU: no suprapubic tenderness Extremities: no edema, no joint deformities Skin: no rashes Neurological: Nonfocal  IMPRESSION/PLAN: Peritoneal carcinomatosis/liver lesions -status post liver biopsy yesterday  Fever -unclear etiology.  Fever curve appears to be improving.  Patient is currently afebrile but this could be masked by recent acetaminophen  dose.  He is nontoxic-appearing.  Labs are grossly unchanged.  No acute findings on exam.  Discussed with Dr. Jacobo and will start patient empirically on Augmentin x 5 days.  ED triggers reviewed.  Neoplasm related pain -patient is currently taking oxycodone , which is  helping and he is tolerating well.  Will refill oxycodone  5 mg every 6 hours as needed #30.  PDMP reviewed  MD follow-up early next week.  Case and plan discussed with Dr. Jacobo  Patient expressed understanding and was in agreement with this plan. He also understands that He can call clinic at any time with any questions, concerns, or complaints.   Thank you for allowing me to participate in the care of this very pleasant patient.   Time Total: 20 minutes  Visit consisted of counseling and education dealing with the complex and emotionally intense issues of symptom management in the setting of serious illness.Greater than 50%  of this time was spent counseling and coordinating care related to the above assessment and plan.  Signed by: Fonda Mower, PhD, NP-C

## 2023-07-25 NOTE — Telephone Encounter (Signed)
 Spoke with patients daughter, Melissa in reference to patient developing fever after biopsy yesterday. Per Melissa, patient started with chills first followed by temperature of 100.4. The highest temp reached was 101. Patients fever responded well to fever reducer tylenol  and ibuprofen . Patient complains of discomfort at biopsy site. Denies any discharge, redness or warm to touch. Offered patient appt at 11 am for labs and smc to be evaluated. Daughter accepted appt.

## 2023-07-28 LAB — SURGICAL PATHOLOGY

## 2023-07-30 ENCOUNTER — Inpatient Hospital Stay: Attending: Oncology | Admitting: Oncology

## 2023-07-30 ENCOUNTER — Inpatient Hospital Stay

## 2023-07-30 ENCOUNTER — Encounter: Payer: Self-pay | Admitting: Oncology

## 2023-07-30 VITALS — BP 133/67 | HR 84 | Temp 96.5°F | Resp 16 | Ht 65.0 in | Wt 167.0 lb

## 2023-07-30 DIAGNOSIS — Z5189 Encounter for other specified aftercare: Secondary | ICD-10-CM | POA: Insufficient documentation

## 2023-07-30 DIAGNOSIS — C787 Secondary malignant neoplasm of liver and intrahepatic bile duct: Secondary | ICD-10-CM | POA: Insufficient documentation

## 2023-07-30 DIAGNOSIS — C786 Secondary malignant neoplasm of retroperitoneum and peritoneum: Secondary | ICD-10-CM

## 2023-07-30 DIAGNOSIS — D72829 Elevated white blood cell count, unspecified: Secondary | ICD-10-CM | POA: Diagnosis not present

## 2023-07-30 DIAGNOSIS — D649 Anemia, unspecified: Secondary | ICD-10-CM | POA: Diagnosis not present

## 2023-07-30 DIAGNOSIS — Z5111 Encounter for antineoplastic chemotherapy: Secondary | ICD-10-CM | POA: Insufficient documentation

## 2023-07-30 DIAGNOSIS — C801 Malignant (primary) neoplasm, unspecified: Secondary | ICD-10-CM | POA: Diagnosis not present

## 2023-07-30 DIAGNOSIS — Z5112 Encounter for antineoplastic immunotherapy: Secondary | ICD-10-CM | POA: Diagnosis not present

## 2023-07-30 DIAGNOSIS — Z87891 Personal history of nicotine dependence: Secondary | ICD-10-CM | POA: Diagnosis not present

## 2023-07-30 DIAGNOSIS — R160 Hepatomegaly, not elsewhere classified: Secondary | ICD-10-CM | POA: Diagnosis not present

## 2023-07-30 DIAGNOSIS — R7401 Elevation of levels of liver transaminase levels: Secondary | ICD-10-CM | POA: Insufficient documentation

## 2023-07-30 DIAGNOSIS — E871 Hypo-osmolality and hyponatremia: Secondary | ICD-10-CM | POA: Diagnosis not present

## 2023-07-30 LAB — CMP (CANCER CENTER ONLY)
ALT: 53 U/L — ABNORMAL HIGH (ref 0–44)
AST: 75 U/L — ABNORMAL HIGH (ref 15–41)
Albumin: 3.2 g/dL — ABNORMAL LOW (ref 3.5–5.0)
Alkaline Phosphatase: 174 U/L — ABNORMAL HIGH (ref 38–126)
Anion gap: 7 (ref 5–15)
BUN: 14 mg/dL (ref 8–23)
CO2: 24 mmol/L (ref 22–32)
Calcium: 9 mg/dL (ref 8.9–10.3)
Chloride: 101 mmol/L (ref 98–111)
Creatinine: 0.96 mg/dL (ref 0.61–1.24)
GFR, Estimated: >60 mL/min (ref >60)
Glucose, Bld: 111 mg/dL — ABNORMAL HIGH (ref 70–99)
Potassium: 4.5 mmol/L (ref 3.5–5.1)
Sodium: 132 mmol/L — ABNORMAL LOW (ref 135–145)
Total Bilirubin: 0.8 mg/dL (ref 0.0–1.2)
Total Protein: 7.4 g/dL (ref 6.5–8.1)

## 2023-07-30 LAB — PSA: Prostatic Specific Antigen: 1.99 ng/mL (ref 0.00–4.00)

## 2023-07-30 MED ORDER — PROCHLORPERAZINE MALEATE 10 MG PO TABS: 10.0000 mg | ORAL_TABLET | Freq: Four times a day (QID) | ORAL | 2 refills | Status: DC | PRN

## 2023-07-30 MED ORDER — LIDOCAINE-PRILOCAINE 2.5-2.5 % EX CREA: TOPICAL_CREAM | CUTANEOUS | 3 refills | Status: DC

## 2023-07-30 MED ORDER — ONDANSETRON HCL 8 MG PO TABS: 8.0000 mg | ORAL_TABLET | Freq: Three times a day (TID) | ORAL | 2 refills | Status: DC | PRN

## 2023-07-30 NOTE — Progress Notes (Signed)
 [facility] comprehensive therapy selection ordered for cancer of unknown primary. Specimen [phone], peritoneum, collected 07/24/2023.

## 2023-07-30 NOTE — Progress Notes (Signed)
 START ON PATHWAY REGIMEN - Hepatobiliary     Cycles 1 through up to 8: A cycle is every 21 days:     Durvalumab      Gemcitabine      Cisplatin    Cycles 9 and beyond: A cycle is every 28 days:     Durvalumab   **Always confirm dose/schedule in your pharmacy ordering system**  Patient Characteristics: Cholangiocarcinoma, Unresectable/Metastatic, Systemic Therapy, First Line Hepatobiliary Disease Type: Cholangiocarcinoma Line of therapy: First Line Intent of Therapy: Non-Curative / Palliative Intent, Discussed with Patient

## 2023-07-30 NOTE — Progress Notes (Signed)
 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    INTERVAL HISTORY: Patient returns to clinic today for further evaluation, discussion of his PET scan and biopsy results, and treatment planning.  He continues to have vague abdominal pain, but this is well-controlled with oxycodone .  He has a poor appetite, but otherwise feels well.  He has no neurologic complaints.  He denies any recent fevers or illnesses.  He has no chest pain, shortness of breath, cough, or hemoptysis.  He denies any nausea, vomiting, constipation, or diarrhea.  He has no melena or hematochezia.  He has no urinary complaints.  Patient offers no further specific complaints today.  REVIEW OF SYSTEMS:   Review of Systems  Constitutional: Negative.  Negative for fever, malaise/fatigue and weight loss.  Respiratory: Negative.  Negative for cough, hemoptysis and shortness of breath.   Cardiovascular: Negative.  Negative for chest pain and leg swelling.  Gastrointestinal:  Positive for abdominal pain. Negative for blood in stool, constipation, diarrhea, melena, nausea and vomiting.  Genitourinary: Negative.  Negative for dysuria.  Musculoskeletal: Negative.  Negative for back pain.  Skin: Negative.  Negative for rash.  Neurological: Negative.  Negative for dizziness, focal weakness, weakness and headaches.  Psychiatric/Behavioral: Negative.  The patient is not nervous/anxious.     As  per HPI. Otherwise, a complete review of systems is negative.  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   CAD (coronary artery disease) 09/17/2021   a.) R/LHC 09/17/2021: 30% pRCA, 40% dRCA, 50% pLCx, 30% dLCx, 50% pLAD, 50% mLAD, 50% dLAD --> med mgmt   Cardiac murmur    Cervical spondylosis    Cholelithiasis    CRAO (central retinal artery occlusion), 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   CVA (cerebral vascular accident) (HCC) 03/13/2018   a.) MRI brain 03/14/2018 --> acute infarct in the posterior RIGHT frontal lobe; small chronic cerebellar infarct --> Tx'd with TPA with (+) improvement/resolution of symptoms   Diastolic dysfunction    a.) TTE 01/31/2015: EF 55-60%, MAC, G1DD; b.) TTE 03/14/2018: EF 60-65%, mild LAE, sev MAC, sev AoV cal with mod AS (MPG 14) G2DD; c.) TTE 09/13/2019: EF 55-60%, mild LAE, mild-mod MR, mild AS (MPG 12.5), G2DD; d.) TTE 08/16/2021: EF 60-65%, mild LVH, mod MAC, mild MR, mod AS (MPG 18.5), G1DD; e.) TTE 12/05/2021: EF 60-65%, mod MAC, s/p TAVR (well seated/functioning valve), mild MR, G1DD   GERD (gastroesophageal reflux disease)    History of chicken pox    History of measles    Hyperlipidemia    Hypertension    LBBB (left bundle branch block)    Long term current use of anticoagulant    a.) apixaban    Low back pain  radiating to left lower extremity    PAF (paroxysmal atrial fibrillation) (HCC)    a.) CHA2DS2VASc = 6 (age x2, HTN, CVA x2, vascular disease history);  b.) rate/rhythm maintained on oral metoprolol  succinate; chronically anticoagulated with apixaban    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   a.) 26mm S3UR via RIGHT TF approach    Skin cancer     PAST SURGICAL HISTORY: Past Surgical History:  Procedure Laterality Date   INTRAOPERATIVE TRANSTHORACIC ECHOCARDIOGRAM N/A 11/06/2021   Procedure: INTRAOPERATIVE TRANSTHORACIC ECHOCARDIOGRAM;  Surgeon: Verlin Lonni BIRCH, MD;  Location: [facility] OR;  Service: Open Heart Surgery;  Laterality: N/A;   RIGHT/LEFT HEART CATH AND CORONARY ANGIOGRAPHY N/A 09/17/2021   Procedure: RIGHT/LEFT HEART CATH AND CORONARY ANGIOGRAPHY;  Surgeon: Verlin Lonni BIRCH, MD;  Location: [facility] INVASIVE CV LAB;  Service: Cardiovascular;  Laterality: N/A;   ROBOTIC ASSISTED LAPAROSCOPIC CHOLECYSTECTOMY  07/24/2022   SKIN CANCER EXCISION  07/2020   TRANSCATHETER AORTIC VALVE REPLACEMENT, TRANSFEMORAL N/A 11/06/2021   Procedure: Transcatheter Aortic Valve Replacement, Transfemoral;  Surgeon: Verlin Lonni BIRCH, MD;  Location: [facility] OR;  Service: Open Heart Surgery;  Laterality: N/A;    FAMILY HISTORY: Family History  Problem Relation Age of Onset   Arrhythmia Mother        A-Fib   Lung cancer Father    Diabetes Son    Colon cancer Neg Hx    Prostate cancer Neg Hx     ADVANCED DIRECTIVES (Y/N):  N  HEALTH MAINTENANCE: Social History   Tobacco Use   Smoking status: Former    Current packs/day: 0.00    Average packs/day: 1 pack/day for 20.0 years (20.0 ttl pk-yrs)    Types: Cigarettes    Start date: 03/12/1963    Quit date: 03/12/1983    Years since quitting: 40.4    Passive exposure: Past   Smokeless tobacco: Never  Vaping Use   Vaping status: Never Used  Substance Use Topics   Alcohol use: No   Drug use: No     Colonoscopy:  PAP:  Bone density:  Lipid panel:  No Known Allergies  Current Outpatient Medications  Medication Sig Dispense Refill   amLODipine  (NORVASC ) 10 MG tablet Take 1 tablet (10 mg total) by mouth daily. 90 tablet 3   amoxicillin -clavulanate (AUGMENTIN) 875-125 MG tablet Take 1 tablet by mouth 2 (two) times daily. 10 tablet 0   apixaban  (ELIQUIS ) 5 MG  TABS tablet TAKE ONE (1) TABLET BY MOUTH TWO TIMES PER DAYTAKE ONE (1) TABLET BY MOUTH TWO TIMES PER DAY, may resume on Friday AM, June 28. 180 tablet 1   atorvastatin  (LIPITOR) 80 MG tablet TAKE ONE TABLET BY MOUTH ONCE DAILY 30 tablet 3   ibuprofen  (ADVIL ) 800 MG tablet Take 1 tablet (800 mg total) by mouth every 8 (eight) hours as needed. 30 tablet 0   metoprolol  succinate (TOPROL -XL) 25 MG 24 hr tablet TAKE ONE AND ONE-HALF TABLETS DAILY 45 tablet 3   ondansetron  (ZOFRAN ) 4 MG tablet Take 1 tablet (4 mg total) by mouth every 8 (eight) hours as needed. 15 tablet 0   oxyCODONE  (ROXICODONE ) 5 MG immediate release tablet Take 1 tablet (5 mg total) by mouth every 6 (six) hours as needed. 30 tablet 0   senna-docusate (SENOKOT-S) 8.6-50 MG tablet Take 1 tablet by mouth daily. 30 tablet 0   amoxicillin  (AMOXIL ) 500 MG tablet Take 4 tablets (2,000 mg total) by mouth as directed. 1 hour prior to dental work including cleanings (Patient not taking:  Reported on 07/30/2023) 12 tablet 12   Cyanocobalamin (B-12) 3000 MCG CAPS Take 3,000 mcg by mouth daily as needed (energy). (Patient not taking: Reported on 07/30/2023)     No current facility-administered medications for this visit.    OBJECTIVE: Vitals:   07/30/23 1003  BP: 133/67  Pulse: 84  Resp: 16  Temp: (!) 96.5 F (35.8 C)  SpO2: 98%     Body mass index is 27.79 kg/m.    ECOG FS:1 - Symptomatic but completely ambulatory  General: Well-developed, well-nourished, no acute distress. Eyes: Pink conjunctiva, anicteric sclera. HEENT: Normocephalic, moist mucous membranes. Lungs: No audible wheezing or coughing. Heart: Regular rate and rhythm. Abdomen: Soft, nontender, no obvious distention. Musculoskeletal: No edema, cyanosis, or clubbing. Neuro: Alert, answering all questions appropriately. Cranial nerves grossly intact. Skin: No rashes or petechiae noted. Psych: Normal affect.  LAB RESULTS:  Lab Results  Component Value Date   NA 131 (L)  07/25/2023   K 4.2 07/25/2023   CL 101 07/25/2023   CO2 22 07/25/2023   GLUCOSE 109 (H) 07/25/2023   BUN 17 07/25/2023   CREATININE 0.96 07/25/2023   CALCIUM  8.7 (L) 07/25/2023   PROT 7.1 07/25/2023   ALBUMIN 3.2 (L) 07/25/2023   AST 45 (H) 07/25/2023   ALT 28 07/25/2023   ALKPHOS 150 (H) 07/25/2023   BILITOT 1.3 (H) 07/25/2023   GFRNONAA >60 07/25/2023   GFRAA 81 08/25/2019    Lab Results  Component Value Date   WBC 15.6 (H) 07/25/2023   NEUTROABS 11.2 (H) 07/25/2023   HGB 9.6 (L) 07/25/2023   HCT 29.5 (L) 07/25/2023   MCV 86.3 07/25/2023   PLT 321 07/25/2023     STUDIES: CT ABDOMINAL MASS BIOPSY Result Date: 07/24/2023 CLINICAL DATA:  Multiple liver metastases and diffuse peritoneal and omental tumor with no known primary carcinoma. The patient presents for tissue biopsy. EXAM: CT GUIDED CORE BIOPSY OF PERITONEAL MASS ANESTHESIA/SEDATION: Moderate (conscious) sedation was employed during this procedure. A total of Versed  1.0 mg and Fentanyl  100 mcg was administered intravenously. Moderate Sedation Time: 15 minutes. The patient's level of consciousness and vital signs were monitored continuously by radiology nursing throughout the procedure under my direct supervision. PROCEDURE: The procedure risks, benefits, and alternatives were explained to the patient. Questions regarding the procedure were encouraged and answered. The patient understands and consents to the procedure. A time out was performed prior to initiating the procedure. RADIATION DOSE REDUCTION: This exam was performed according to the departmental dose-optimization program which includes automated exposure control, adjustment of the mA and/or kV according to patient size and/or use of iterative reconstruction technique. The left abdominal wall was prepped with chlorhexidine  in a sterile fashion, and a sterile drape was applied covering the operative field. A sterile gown and sterile gloves were used for the procedure.  Local anesthesia was provided with 1% Lidocaine . CT was performed in a supine position through the mid to lower abdomen. After localizing peritoneal tumor in the left lateral anterior peritoneal cavity, a 17 gauge trocar needle was advanced under CT guidance to the level of tumor mass. Three separate coaxial 18 gauge core biopsy samples were obtained and submitted in formalin. A slurry of Gel-Foam EmboCubes was then injected via the outer needle prior to its retraction and removal. Additional CT was performed. COMPLICATIONS: None FINDINGS: Bulky anterior peritoneal tumor just deep to the left lateral abdominal wall was chosen for sampling with solid tissue obtained. IMPRESSION: CT-guided core biopsy performed of peritoneal tumor in the  left anterior peritoneal cavity. Electronically Signed   By: Marcey Moan M.D.   On: 07/24/2023 09:53   NM PET Image Initial (PI) Skull Base To Thigh Result Date: 07/15/2023 CLINICAL DATA:  Initial treatment strategy for liver masses. Peritoneal carcinomatosis. EXAM: NUCLEAR MEDICINE PET SKULL BASE TO THIGH TECHNIQUE: 9.7 mCi F-18 FDG was injected intravenously. Full-ring PET imaging was performed from the skull base to thigh after the radiotracer. CT data was obtained and used for attenuation correction and anatomic localization. Fasting blood glucose: 101 mg/dl COMPARISON:  Abdomen pelvis CT with contrast 07/10/2023 FINDINGS: Mediastinal blood pool activity: SUV max 2.5 Liver activity: SUV max 2.6 NECK: No specific abnormal uptake in the neck including along lymph node change of the submandibular, posterior triangle or internal jugular region. Near symmetric uptake of the visualized intracranial compartment. Incidental CT findings: The parotid glands, submandibular glands unremarkable. Small thyroid  gland. Paranasal sinuses and mastoid air cells are clear. Nasal septal deviation. Streak artifact related to the patient's dental hardware. Scattered vascular calcifications.  CHEST: No abnormal uptake above blood pool in the axillary regions, hilum or mediastinum. Mild uptake along the lower esophagus, nonspecific. There is a small hiatal hernia. No abnormal uptake along the lung parenchyma. Incidental CT findings: Status post TAVR. No pericardial effusion. Borderline size heart. Scattered vascular calcifications are seen including along the coronary arteries and great vessels. Possible areas of significant stenosis along the subclavian arteries bilaterally. Please correlate with any symptoms and if needed dedicated CTA as clinically appropriate. Breathing motion. There is some linear opacity identified dependently along the lungs likely scar or atelectasis. The there are some small pre cardiophrenic nodes on image 63 which are under 5 mm in short axis. No abnormal uptake. ABDOMEN/PELVIS: Numerous hypermetabolic liver masses are identified. At least 30 lesions are identified. The majority are small under 2 cm. Example 0 1 larger foci inferior right hepatic lobe measures 2.1 x 1.8 cm on image 80 of the CT scan and has maximum SUV of 8.5. Larger focus in segment 4A has maximum SUV of 7.7 and on image 67 is anterior lesion measures 2.8 by 1.9 cm. Distribution on PET images show some lesions being target like. All segments are affected. In addition peritoneal carcinomatosis identified with nodular soft tissue seen anteriorly which is all hypermetabolic. Example on the right side of the abdomen has maximum SUV of 9.3 on image 102. Left side on the same image has maximum SUV 9.5. Otherwise there is physiologic distribution of tracer along other parenchymal organs, renal collecting systems and bowel. There is some scattered ascites identified. Few prominent nodes identified right upper quadrant. Example node anterior to the head of the pancreas on image 81 measures 10 by 19 mm. This stone has low-level uptake however maximum SUV of 3.2. Attention on follow-up. Few other nodes as well similar.  Incidental CT findings: Diffuse vascular calcifications identified. Normal caliber aorta and IVC. No renal or ureteral stones are identified. Underdistended urinary bladder. Prominent prostate without uptake. Grossly the spleen, pancreas and adrenal glands are preserved. Liver margin is slightly nodular. This could be pseudo cirrhosis. SKELETON: No abnormal uptake along the visualized osseous structures. Incidental CT findings: Scattered degenerative changes. 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 In addition there are numerous hypermetabolic liver lesions identified. Appearance is somewhat target like by PET-CT. Few prominent upper abdominal lymph nodes identified with some minimal uptake. Attention on follow-up. No areas of abnormal uptake seen above the  diaphragm. Small hiatal hernia. Diffuse vascular calcifications. Status post TAVR. Electronically Signed   By: Ranell Bring M.D.   On: 07/15/2023 10:21   CT ABDOMEN PELVIS W CONTRAST Result Date: 07/10/2023 CLINICAL DATA:  Right upper quadrant abdominal pain EXAM: CT ABDOMEN AND PELVIS WITHOUT CONTRAST TECHNIQUE: Multidetector CT imaging of the abdomen and pelvis was performed following the standard protocol without IV contrast. RADIATION DOSE REDUCTION: This exam was performed according to the departmental dose-optimization program which includes automated exposure control, adjustment of the mA and/or kV according to patient size and/or use of iterative reconstruction technique. COMPARISON:  CT abdomen and pelvis September 28, 2021 and right upper quadrant abdominal ultrasound May 24, 2022 FINDINGS: Lower chest: No infiltrates or consolidations, no pleural effusions Hepatobiliary: The liver demonstrates multiple inhomogeneous lesions throughout the right and left lobe of the liver measuring from a few mm up to several cm largest in the right lobe of the liver 2.3 cm along the  superior margin and 2.2 cm in segment 5. Within the left lobe of the liver segment 4A and 4B multiple lesions measuring between 1 and 2 cm in diameter findings are highly suggestive of metastatic liver disease. Prior cholecystectomy. No biliary dilatation. There is a small amount of ascites abdomen and pelvis with diffuse nodularity of the mesentery findings that are highly consistent with peritoneal carcinomatosis. Pancreas: Pancreas normal size. No masses calcifications or inflammatory changes. Spleen: Spleen normal size.  No masses. Adrenals/Urinary Tract: Adrenal glands are normal size. Follow-up recommended. Kidneys are normal. No masses calcifications or hydronephrosis Stomach/Bowel: No small or large bowel obstruction or inflammatory changes. Moderate amount of residual fecal material throughout the colon without obstruction or constipation. Vascular/Lymphatic: No significant vascular findings are present. No enlarged abdominal or pelvic lymph nodes. Reproductive: .  No masses. Bladder unremarkable. Other: Anterior abdominal wall unremarkable without evidence of umbilical or inguinal hernias Musculoskeletal: Visualized portion of the thoracolumbar spine and pelvic structures grossly unremarkable without evidence of fracture bony abnormalities or soft tissue masses. IMPRESSION: *Multiple inhomogeneous lesions throughout the right and left lobe of the liver highly suggestive of metastatic liver disease. *Small amount of ascites abdomen and pelvis with diffuse nodularity of the mesentery findings that are highly consistent with peritoneal carcinomatosis. *Moderate amount of residual fecal material throughout the colon without obstruction or constipation. Electronically Signed   By: Franky Chard M.D.   On: 07/10/2023 14:37   DG Chest Portable 1 View Result Date: 07/10/2023 CLINICAL DATA:  Shortness of breath. EXAM: PORTABLE CHEST 1 VIEW COMPARISON:  Chest radiograph dated 11/02/2021. FINDINGS: No focal  consolidation, pleural effusion or pneumothorax. The cardiac silhouette is within normal limits. Aortic valve repair. Atherosclerotic calcification of the aorta. No acute osseous pathology. IMPRESSION: No active disease. Electronically Signed   By: Vanetta Chou M.D.   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  PET scan on July 15, 2023 revealed greater than 30 liver lesions as well as carcinomatosis.  Subsequent biopsy a poorly differentiated carcinoma consistent with either upper GI or pancreatobiliary origin.  Tumor markers are negative.  NGS and tissue enlargement testing is pending at time of dictation.  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  This will be a 21-day cycle.  Patient will require port placement prior to initiating treatment.  Return to clinic in approximately 2 weeks to initiate cycle 1, day 1.   Hyponatremia: Chronic and unchanged.  Patient sodium level is 132. Transaminitis: Mild, monitor. Hyperbilirubinemia: Resolved. Anemia: Patient's hemoglobin has trended down to 9.6, monitor. Leukocytosis: Likely reactive, monitor.   Patient expressed understanding and was in agreement with this plan. He also understands that He can call clinic at any time with any questions, concerns, or complaints.    Cancer Staging  No matching staging information was found for the patient.   Evalene JINNY Reusing, MD   07/30/2023 10:16 AM

## 2023-07-31 ENCOUNTER — Telehealth: Payer: Self-pay | Admitting: *Deleted

## 2023-07-31 ENCOUNTER — Other Ambulatory Visit: Payer: Self-pay

## 2023-07-31 ENCOUNTER — Telehealth: Payer: Self-pay

## 2023-07-31 NOTE — Telephone Encounter (Signed)
 Patient scheduled on Thurs 7/10 at 8:30 am, arrive at 7:30 am for procedure. for port insertion. RN called and spoke with patients wife and she is agreeable to this appointment. Advised that patient would check in at the [facility] entrance at 7:30 am, patient will need a driver and should be NPO after midnight. Patients wife verbalized agreement.

## 2023-07-31 NOTE — Telephone Encounter (Signed)
 Copied from CRM [id number]. Topic: Appointments - Transfer of Care >> Jul 31, 2023  9:24 AM Tonda B wrote: Pt is requesting to transfer FROM: [facility] Pt is requesting to transfer TO: [facility] Reason for requested transfer: unsatisfied It is the responsibility of the team the patient would like to transfer to (Dr. gasper) to reach out to the patient if for any reason this transfer is not acceptable.

## 2023-07-31 NOTE — Telephone Encounter (Signed)
 Patient requesting to transfer; are you okay accepting transfer?

## 2023-07-31 NOTE — Progress Notes (Signed)
 Pharmacist Chemotherapy Monitoring - Initial Assessment    Anticipated start date: 08/12/23   The following has been reviewed per standard work regarding the patient's treatment regimen: The patient's diagnosis, treatment plan and drug doses, and organ/hematologic function Lab orders and baseline tests specific to treatment regimen  The treatment plan start date, drug sequencing, and pre-medications Prior authorization status  Patient's documented medication list, including drug-drug interaction screen and prescriptions for anti-emetics and supportive care specific to the treatment regimen The drug concentrations, fluid compatibility, administration routes, and timing of the medications to be used The patient's access for treatment and lifetime cumulative dose history, if applicable  The patient's medication allergies and previous infusion related reactions, if applicable   Stage IV poorly differentiated carcinoma, 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 This will be a 21-day cycle  Follow up needed:    Jon Mejia, [facility], 07/31/2023  3:50 PM

## 2023-07-31 NOTE — Telephone Encounter (Signed)
 Yes I would be ok with TOC

## 2023-08-04 ENCOUNTER — Inpatient Hospital Stay

## 2023-08-05 ENCOUNTER — Encounter: Payer: Self-pay | Admitting: Oncology

## 2023-08-05 ENCOUNTER — Inpatient Hospital Stay

## 2023-08-05 ENCOUNTER — Other Ambulatory Visit (HOSPITAL_COMMUNITY): Payer: Self-pay | Admitting: Student

## 2023-08-05 DIAGNOSIS — R160 Hepatomegaly, not elsewhere classified: Secondary | ICD-10-CM

## 2023-08-05 DIAGNOSIS — C801 Malignant (primary) neoplasm, unspecified: Secondary | ICD-10-CM | POA: Diagnosis not present

## 2023-08-05 NOTE — Progress Notes (Signed)
 [facility] Psychosocial Distress Screening Clinical Social Work  Jon Mejia is a 83 y.o. year old male. Clinical Social Work was referred by nurse navigator for positive distress screening. The patient scored a 5 on the Psychosocial Distress Thermometer which indicates moderate distress. Clinical Social Worker contacted patient by phone and his wife, [name], to assess for distress and other psychosocial needs.     Distress Screen:    08/05/2023    2:19 PM  ONCBCN DISTRESS SCREENING  Screening Type Initial Screening  How much distress have you been experiencing in the past week? (0-10) 5  Emotional concerns type Worry or anxiety  Physical Concerns Type  Sleep;Fatigue;Changes in eating     Interventions: Provided brief mental health counseling with regard to anxiety.   CSW and patient discussed common feeling and emotions when being diagnosed with cancer, and the importance of support during treatment.  CSW informed patient of the support team and support services at [facility].  CSW provided contact information and encouraged patient to call with any questions or concerns.  Patient reported decreased anxiety after attending the chemo education  session with Raoul Moats, RN.   Follow Up Plan: CSW will follow-up with patient by phone  Patient verbalizes understanding of plan: Yes    [name], LCSW

## 2023-08-06 NOTE — Progress Notes (Signed)
 Patient for IR Port Insertion on Thurs 08/07/23, I called and spoke with the patient's wife, [name] on the phone and gave pre-procedure instructions. [name] was made aware to have the patient here at 10a, NPO after MN prior to procedure as well as driver post procedure/recovery/discharge. [name] stated understanding.  Called 08/06/23

## 2023-08-07 ENCOUNTER — Encounter: Payer: Self-pay | Admitting: Radiology

## 2023-08-07 ENCOUNTER — Other Ambulatory Visit: Payer: Self-pay

## 2023-08-07 ENCOUNTER — Ambulatory Visit
Admission: RE | Admit: 2023-08-07 | Discharge: 2023-08-07 | Disposition: A | Discharge status: Home / Self Care | Source: Ambulatory Visit | Attending: Oncology | Admitting: Oncology

## 2023-08-07 DIAGNOSIS — C786 Secondary malignant neoplasm of retroperitoneum and peritoneum: Secondary | ICD-10-CM | POA: Insufficient documentation

## 2023-08-07 DIAGNOSIS — R160 Hepatomegaly, not elsewhere classified: Secondary | ICD-10-CM | POA: Diagnosis not present

## 2023-08-07 DIAGNOSIS — C482 Malignant neoplasm of peritoneum, unspecified: Secondary | ICD-10-CM | POA: Diagnosis not present

## 2023-08-07 DIAGNOSIS — Z952 Presence of prosthetic heart valve: Secondary | ICD-10-CM | POA: Diagnosis not present

## 2023-08-07 HISTORY — PX: IR IMAGING GUIDED PORT INSERTION: IMG5740

## 2023-08-07 MED ORDER — SODIUM CHLORIDE 0.9 % IV SOLN: INTRAVENOUS | Status: DC

## 2023-08-07 MED ORDER — LIDOCAINE-EPINEPHRINE 1 %-1:100000 IJ SOLN
INTRAMUSCULAR | Status: AC
  Filled 2023-08-07: qty 1

## 2023-08-07 MED ORDER — MIDAZOLAM HCL 2 MG/2ML IJ SOLN
INTRAMUSCULAR | Status: AC | PRN
  Administered 2023-08-07: 1 mg via INTRAVENOUS
  Administered 2023-08-07 (×2): .5 mg via INTRAVENOUS

## 2023-08-07 MED ORDER — CEFAZOLIN SODIUM-DEXTROSE 2-4 GM/100ML-% IV SOLN
INTRAVENOUS | Status: AC
Start: 2023-08-07 — End: 2023-08-07
  Filled 2023-08-07: qty 100

## 2023-08-07 MED ORDER — HEPARIN SOD (PORK) LOCK FLUSH 100 UNIT/ML IV SOLN
INTRAVENOUS | Status: AC
  Filled 2023-08-07: qty 5

## 2023-08-07 MED ORDER — LIDOCAINE-EPINEPHRINE 1 %-1:100000 IJ SOLN
10.0000 mL | Freq: Once | INTRAMUSCULAR | Status: AC
  Administered 2023-08-07: 10 mL via INTRADERMAL

## 2023-08-07 MED ORDER — MIDAZOLAM HCL 2 MG/2ML IJ SOLN
INTRAMUSCULAR | Status: AC
  Filled 2023-08-07: qty 2

## 2023-08-07 MED ORDER — CEFAZOLIN SODIUM-DEXTROSE 2-4 GM/100ML-% IV SOLN
INTRAVENOUS | Status: AC | PRN
  Administered 2023-08-07: 2 g via INTRAVENOUS

## 2023-08-07 MED ORDER — FENTANYL CITRATE (PF) 100 MCG/2ML IJ SOLN
INTRAMUSCULAR | Status: AC | PRN
  Administered 2023-08-07: 25 ug via INTRAVENOUS
  Administered 2023-08-07: 50 ug via INTRAVENOUS
  Administered 2023-08-07: 25 ug via INTRAVENOUS

## 2023-08-07 MED ORDER — LIDOCAINE HCL 1 % IJ SOLN
INTRAMUSCULAR | Status: AC
  Filled 2023-08-07: qty 20

## 2023-08-07 MED ORDER — FENTANYL CITRATE (PF) 100 MCG/2ML IJ SOLN
INTRAMUSCULAR | Status: AC
  Filled 2023-08-07: qty 2

## 2023-08-07 MED ORDER — HEPARIN SOD (PORK) LOCK FLUSH 100 UNIT/ML IV SOLN
500.0000 [IU] | Freq: Once | INTRAVENOUS | Status: AC
  Administered 2023-08-07: 500 [IU] via INTRAVENOUS

## 2023-08-07 MED ORDER — LIDOCAINE HCL 1 % IJ SOLN
20.0000 mL | Freq: Once | INTRAMUSCULAR | Status: AC
  Administered 2023-08-07: 20 mL via INTRADERMAL

## 2023-08-07 NOTE — H&P (Signed)
 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 male with a history of aortic stenosis s/p TAVR known to IR from  recent abdominal mass biopsy. 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 Patient has been following with Dr. 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 Patient presents today with his wife and daughter for his port-a-catheter placement.  Patient states that he has some abdominal soreness following his last biopsy, but otherwise is unchanged from his last visit a few weeks ago. Continues to have intermittent abdominal pains and shortness of breath, but is otherwise without complaints. He underwent a port education class at the cancer center and feels prepared overall for today's procedure. All questions and concerns answered at the bedside.  Code Status: Full Code  Past Medical History:  Diagnosis Date   Angina pectoris (HCC)    Aortic atherosclerosis (HCC)    Aortic stenosis    a.) TTE 03/14/2018: mod AS (MPG 14); b.) TTE 09/13/2019: mild AS (MPG 12.5); c.) TTE 08/16/2021: mod AS (MPG 18.5); d.) R/LHC 09/17/2021: mRA 3, mPA 19, mPCWP 11, LVEDP 15, CO 5.22, CI 2.75; e.) s/p TAVR 11/06/2021   CAD (coronary artery disease) 09/17/2021   a.) R/LHC 09/17/2021: 30% pRCA, 40% dRCA, 50% pLCx, 30% dLCx, 50% pLAD, 50% mLAD, 50% dLAD --> med mgmt   Cardiac murmur    Cervical spondylosis    Cholelithiasis    CRAO (central retinal artery occlusion), 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   CVA (cerebral vascular accident) (HCC) 03/13/2018   a.) MRI brain 03/14/2018 --> acute infarct in the posterior RIGHT frontal lobe; small chronic  cerebellar infarct --> Tx'd with TPA with (+) improvement/resolution of symptoms   Diastolic dysfunction    a.) TTE 01/31/2015: EF 55-60%, MAC, G1DD; b.) TTE 03/14/2018: EF 60-65%, mild LAE, sev MAC, sev AoV cal with mod AS (MPG 14) G2DD; c.) TTE 09/13/2019: EF 55-60%, mild LAE, mild-mod MR, mild AS (MPG 12.5), G2DD; d.) TTE 08/16/2021: EF 60-65%, mild LVH, mod MAC, mild MR, mod AS (MPG 18.5), G1DD; e.) TTE 12/05/2021: EF 60-65%, mod MAC, s/p TAVR (well seated/functioning valve), mild MR, G1DD   GERD (gastroesophageal reflux disease)    History of chicken pox    History of measles    Hyperlipidemia    Hypertension    LBBB (left bundle branch block)    Long term current use of anticoagulant    a.) apixaban    Low back pain radiating to left lower extremity    PAF (paroxysmal atrial fibrillation) (HCC)    a.) CHA2DS2VASc = 6 (age x2, HTN, CVA x2, vascular disease history);  b.) rate/rhythm maintained on oral metoprolol  succinate; chronically anticoagulated with apixaban    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   a.) 26mm S3UR via RIGHT TF approach   Skin cancer     Past Surgical History:  Procedure Laterality Date   INTRAOPERATIVE TRANSTHORACIC ECHOCARDIOGRAM N/A 11/06/2021   Procedure: INTRAOPERATIVE TRANSTHORACIC ECHOCARDIOGRAM;  Surgeon: Verlin Lonni BIRCH, MD;  Location: [facility] OR;  Service: Open Heart Surgery;  Laterality: N/A;   RIGHT/LEFT HEART CATH AND CORONARY ANGIOGRAPHY N/A 09/17/2021   Procedure: RIGHT/LEFT HEART CATH AND CORONARY ANGIOGRAPHY;  Surgeon: Verlin Lonni BIRCH, MD;  Location: [facility] INVASIVE CV LAB;  Service: Cardiovascular;  Laterality: N/A;   ROBOTIC ASSISTED LAPAROSCOPIC CHOLECYSTECTOMY  07/24/2022   SKIN CANCER EXCISION  07/2020   TRANSCATHETER AORTIC VALVE REPLACEMENT, TRANSFEMORAL N/A 11/06/2021   Procedure: Transcatheter Aortic Valve Replacement, Transfemoral;  Surgeon: Verlin Lonni BIRCH, MD;  Location: [facility] OR;  Service: Open Heart  Surgery;  Laterality: N/A;    Allergies: Patient has no known allergies.  Medications: Prior to Admission medications   Medication Sig Start Date End Date Taking? Authorizing Provider  amLODipine  (NORVASC ) 10 MG tablet Take 1 tablet (10 mg total) by mouth daily. 04/25/23  Yes Furth, Cadence H, PA-C  apixaban  (ELIQUIS ) 5 MG TABS tablet TAKE ONE (1) TABLET BY MOUTH TWO TIMES PER DAYTAKE ONE (1) TABLET BY MOUTH TWO TIMES PER DAY, may resume on Friday AM, June 28. 07/24/22  Yes Lane Shope, MD  atorvastatin  (LIPITOR) 80 MG tablet TAKE ONE TABLET BY MOUTH ONCE DAILY 04/28/23  Yes Sethi, Pramod S, MD  metoprolol  succinate (TOPROL -XL) 25 MG 24 hr tablet TAKE ONE AND ONE-HALF TABLETS DAILY 06/02/23  Yes Furth, Cadence H, PA-C  oxyCODONE  (ROXICODONE ) 5 MG immediate release tablet Take 1 tablet (5 mg total) by mouth every 6 (six) hours as needed. 07/25/23 07/24/24 Yes Borders, Fonda SAUNDERS, NP  senna-docusate (SENOKOT-S) 8.6-50 MG tablet Take 1 tablet by mouth daily. 07/10/23  Yes Malvina Alm DASEN, MD  amoxicillin  (AMOXIL ) 500 MG tablet Take 4 tablets (2,000 mg total) by mouth as directed. 1 hour prior to dental work including cleanings Patient not taking: Reported on 07/30/2023 11/15/22   Sebastian Lamarr SAUNDERS, PA-C  amoxicillin -clavulanate (AUGMENTIN ) 875-125 MG tablet Take 1 tablet by mouth 2 (two) times daily. 07/25/23   Borders, Fonda SAUNDERS, NP  Cyanocobalamin (B-12) 3000 MCG CAPS Take 3,000 mcg by mouth daily as needed (energy). Patient not taking: Reported on 07/30/2023    [provider]  ibuprofen  (ADVIL ) 800 MG tablet Take 1 tablet (800 mg total) by mouth every 8 (eight) hours as needed. 07/24/22   Lane Shope, MD  lidocaine -prilocaine  (EMLA ) cream Apply to affected area once 07/30/23   Jacobo Evalene PARAS, MD  ondansetron  (ZOFRAN ) 4 MG tablet Take 1 tablet (4 mg total) by mouth every 8 (eight) hours as needed. 07/10/23 07/09/24  Malvina Alm DASEN, MD  ondansetron  (ZOFRAN ) 8 MG tablet Take 1 tablet (8 mg  total) by mouth every 8 (eight) hours as needed for nausea or vomiting. Start on the third day after cisplatin. 07/30/23   Jacobo Evalene PARAS, MD  prochlorperazine  (COMPAZINE ) 10 MG tablet Take 1 tablet (10 mg total) by mouth every 6 (six) hours as needed (Nausea or vomiting). 07/30/23   Jacobo Evalene PARAS, MD     Family History  Problem Relation Age of Onset   Arrhythmia Mother        A-Fib   Lung cancer Father    Diabetes Son    Colon cancer Neg Hx    Prostate cancer Neg Hx     Social History   Socioeconomic History   Marital status: Married    Spouse name: [name]   Number of children: 2   Years of education: Not on file   Highest education level: 12th grade  Occupational History   Occupation: Retired   Occupation: Retired [employer]  Tobacco Use   Smoking status: Former    Current packs/day: 0.00    Average packs/day: 1 pack/day for 20.0 years (20.0 ttl pk-yrs)    Types: Cigarettes    Start date: 03/12/1963  Quit date: 03/12/1983    Years since quitting: 40.4    Passive exposure: Past   Smokeless tobacco: Never  Vaping Use   Vaping status: Never Used  Substance and Sexual Activity   Alcohol use: No   Drug use: No   Sexual activity: Not on file  Other Topics Concern   Not on file  Social History Narrative   Not on file   Social Drivers of Health   Financial Resource Strain: Low Risk  (07/06/2023)   Overall Financial Resource Strain (CARDIA)    Difficulty of Paying Living Expenses: Not hard at all  Food Insecurity: No Food Insecurity (07/29/2023)   Hunger Vital Sign    Worried About Running Out of Food in the Last Year: Never true    Ran Out of Food in the Last Year: Never true  Transportation Needs: No Transportation Needs (07/29/2023)   PRAPARE - Administrator, Civil Service (Medical): No    Lack of Transportation (Non-Medical): No  Physical Activity: Sufficiently Active (07/06/2023)   Exercise Vital Sign    Days of Exercise per Week: 3 days     Minutes of Exercise per Session: 50 min  Stress: No Stress Concern Present (07/06/2023)   [employer] of Occupational Health - Occupational Stress Questionnaire    Feeling of Stress : Not at all  Social Connections: Socially Integrated (07/06/2023)   Social Connection and Isolation Panel    Frequency of Communication with Friends and Family: More than three times a week    Frequency of Social Gatherings with Friends and Family: More than three times a week    Attends Religious Services: More than 4 times per year    Active Member of [employer] or Organizations: Yes    Attends Engineer, structural: More than 4 times per year    Marital Status: Married    Review of Systems  Respiratory:  Positive for shortness of breath.   Gastrointestinal:  Positive for abdominal pain.  Denies any N/V, chest pain, fevers/chills. All other ROS negative.  Vital Signs: Pulse 81   Temp 98.1 F (36.7 C) (Temporal)   Resp 12   Ht 5' 5 (1.651 m)   Wt 158 lb 14.4 oz (72.1 kg)   SpO2 97%   BMI 26.44 kg/m    Physical Exam Vitals reviewed.  Constitutional:      Appearance: Normal appearance.  HENT:     Head: Normocephalic and atraumatic.     Mouth/Throat:     Mouth: Mucous membranes are moist.     Pharynx: Oropharynx is clear.  Cardiovascular:     Rate and Rhythm: Normal rate and regular rhythm.     Heart sounds: Normal heart sounds.  Pulmonary:     Effort: Pulmonary effort is normal.  Abdominal:     General: Abdomen is flat.     Palpations: Abdomen is soft.     Tenderness: There is no abdominal tenderness.  Musculoskeletal:        General: Normal range of motion.     Cervical back: Normal range of motion.  Skin:    General: Skin is warm and dry.  Neurological:     General: No focal deficit present.     Mental Status: He is alert and oriented to person, place, and time. Mental status is at baseline.  Psychiatric:        Mood and Affect: Mood normal.        Behavior: Behavior  normal.  Judgment: Judgment normal.     Imaging: CT ABDOMINAL MASS BIOPSY Result Date: 07/24/2023 CLINICAL DATA:  Multiple liver metastases and diffuse peritoneal and omental tumor with no known primary carcinoma. The patient presents for tissue biopsy. EXAM: CT GUIDED CORE BIOPSY OF PERITONEAL MASS ANESTHESIA/SEDATION: Moderate (conscious) sedation was employed during this procedure. A total of Versed  1.0 mg and Fentanyl  100 mcg was administered intravenously. Moderate Sedation Time: 15 minutes. The patient's level of consciousness and vital signs were monitored continuously by radiology nursing throughout the procedure under my direct supervision. PROCEDURE: The procedure risks, benefits, and alternatives were explained to the patient. Questions regarding the procedure were encouraged and answered. The patient understands and consents to the procedure. A time out was performed prior to initiating the procedure. RADIATION DOSE REDUCTION: This exam was performed according to the departmental dose-optimization program which includes automated exposure control, adjustment of the mA and/or kV according to patient size and/or use of iterative reconstruction technique. The left abdominal wall was prepped with chlorhexidine  in a sterile fashion, and a sterile drape was applied covering the operative field. A sterile gown and sterile gloves were used for the procedure. Local anesthesia was provided with 1% Lidocaine . CT was performed in a supine position through the mid to lower abdomen. After localizing peritoneal tumor in the left lateral anterior peritoneal cavity, a 17 gauge trocar needle was advanced under CT guidance to the level of tumor mass. Three separate coaxial 18 gauge core biopsy samples were obtained and submitted in formalin. A slurry of Gel-Foam EmboCubes was then injected via the outer needle prior to its retraction and removal. Additional CT was performed. COMPLICATIONS: None FINDINGS: Bulky  anterior peritoneal tumor just deep to the left lateral abdominal wall was chosen for sampling with solid tissue obtained. IMPRESSION: CT-guided core biopsy performed of peritoneal tumor in the left anterior peritoneal cavity. Electronically Signed   By: Marcey Moan M.D.   On: 07/24/2023 09:53   NM PET Image Initial (PI) Skull Base To Thigh Result Date: 07/15/2023 CLINICAL DATA:  Initial treatment strategy for liver masses. Peritoneal carcinomatosis. EXAM: NUCLEAR MEDICINE PET SKULL BASE TO THIGH TECHNIQUE: 9.7 mCi F-18 FDG was injected intravenously. Full-ring PET imaging was performed from the skull base to thigh after the radiotracer. CT data was obtained and used for attenuation correction and anatomic localization. Fasting blood glucose: 101 mg/dl COMPARISON:  Abdomen pelvis CT with contrast 07/10/2023 FINDINGS: Mediastinal blood pool activity: SUV max 2.5 Liver activity: SUV max 2.6 NECK: No specific abnormal uptake in the neck including along lymph node change of the submandibular, posterior triangle or internal jugular region. Near symmetric uptake of the visualized intracranial compartment. Incidental CT findings: The parotid glands, submandibular glands unremarkable. Small thyroid  gland. Paranasal sinuses and mastoid air cells are clear. Nasal septal deviation. Streak artifact related to the patient's dental hardware. Scattered vascular calcifications. CHEST: No abnormal uptake above blood pool in the axillary regions, hilum or mediastinum. Mild uptake along the lower esophagus, nonspecific. There is a small hiatal hernia. No abnormal uptake along the lung parenchyma. Incidental CT findings: Status post TAVR. No pericardial effusion. Borderline size heart. Scattered vascular calcifications are seen including along the coronary arteries and great vessels. Possible areas of significant stenosis along the subclavian arteries bilaterally. Please correlate with any symptoms and if needed dedicated CTA as  clinically appropriate. Breathing motion. There is some linear opacity identified dependently along the lungs likely scar or atelectasis. The there are some small pre cardiophrenic nodes on image  63 which are under 5 mm in short axis. No abnormal uptake. ABDOMEN/PELVIS: Numerous hypermetabolic liver masses are identified. At least 30 lesions are identified. The majority are small under 2 cm. Example 0 1 larger foci inferior right hepatic lobe measures 2.1 x 1.8 cm on image 80 of the CT scan and has maximum SUV of 8.5. Larger focus in segment 4A has maximum SUV of 7.7 and on image 67 is anterior lesion measures 2.8 by 1.9 cm. Distribution on PET images show some lesions being target like. All segments are affected. In addition peritoneal carcinomatosis identified with nodular soft tissue seen anteriorly which is all hypermetabolic. Example on the right side of the abdomen has maximum SUV of 9.3 on image 102. Left side on the same image has maximum SUV 9.5. Otherwise there is physiologic distribution of tracer along other parenchymal organs, renal collecting systems and bowel. There is some scattered ascites identified. Few prominent nodes identified right upper quadrant. Example node anterior to the head of the pancreas on image 81 measures 10 by 19 mm. This stone has low-level uptake however maximum SUV of 3.2. Attention on follow-up. Few other nodes as well similar. Incidental CT findings: Diffuse vascular calcifications identified. Normal caliber aorta and IVC. No renal or ureteral stones are identified. Underdistended urinary bladder. Prominent prostate without uptake. Grossly the spleen, pancreas and adrenal glands are preserved. Liver margin is slightly nodular. This could be pseudo cirrhosis. SKELETON: No abnormal uptake along the visualized osseous structures. Incidental CT findings: Scattered degenerative changes. IMPRESSION: As seen on the prior CT scan, there is hypermetabolic peritoneal carcinomatosis  identified with ascites. In addition there are numerous hypermetabolic liver lesions identified. Appearance is somewhat target like by PET-CT. Few prominent upper abdominal lymph nodes identified with some minimal uptake. Attention on follow-up. No areas of abnormal uptake seen above the diaphragm. Small hiatal hernia. Diffuse vascular calcifications. Status post TAVR. Electronically Signed   By: Ranell Bring M.D.   On: 07/15/2023 10:21   CT ABDOMEN PELVIS W CONTRAST Result Date: 07/10/2023 CLINICAL DATA:  Right upper quadrant abdominal pain EXAM: CT ABDOMEN AND PELVIS WITHOUT CONTRAST TECHNIQUE: Multidetector CT imaging of the abdomen and pelvis was performed following the standard protocol without IV contrast. RADIATION DOSE REDUCTION: This exam was performed according to the departmental dose-optimization program which includes automated exposure control, adjustment of the mA and/or kV according to patient size and/or use of iterative reconstruction technique. COMPARISON:  CT abdomen and pelvis September 28, 2021 and right upper quadrant abdominal ultrasound May 24, 2022 FINDINGS: Lower chest: No infiltrates or consolidations, no pleural effusions Hepatobiliary: The liver demonstrates multiple inhomogeneous lesions throughout the right and left lobe of the liver measuring from a few mm up to several cm largest in the right lobe of the liver 2.3 cm along the superior margin and 2.2 cm in segment 5. Within the left lobe of the liver segment 4A and 4B multiple lesions measuring between 1 and 2 cm in diameter findings are highly suggestive of metastatic liver disease. Prior cholecystectomy. No biliary dilatation. There is a small amount of ascites abdomen and pelvis with diffuse nodularity of the mesentery findings that are highly consistent with peritoneal carcinomatosis. Pancreas: Pancreas normal size. No masses calcifications or inflammatory changes. Spleen: Spleen normal size.  No masses. Adrenals/Urinary  Tract: Adrenal glands are normal size. Follow-up recommended. Kidneys are normal. No masses calcifications or hydronephrosis Stomach/Bowel: No small or large bowel obstruction or inflammatory changes. Moderate amount of residual fecal material throughout  the colon without obstruction or constipation. Vascular/Lymphatic: No significant vascular findings are present. No enlarged abdominal or pelvic lymph nodes. Reproductive: .  No masses. Bladder unremarkable. Other: Anterior abdominal wall unremarkable without evidence of umbilical or inguinal hernias Musculoskeletal: Visualized portion of the thoracolumbar spine and pelvic structures grossly unremarkable without evidence of fracture bony abnormalities or soft tissue masses. IMPRESSION: *Multiple inhomogeneous lesions throughout the right and left lobe of the liver highly suggestive of metastatic liver disease. *Small amount of ascites abdomen and pelvis with diffuse nodularity of the mesentery findings that are highly consistent with peritoneal carcinomatosis. *Moderate amount of residual fecal material throughout the colon without obstruction or constipation. Electronically Signed   By: Franky Chard M.D.   On: 07/10/2023 14:37   DG Chest Portable 1 View Result Date: 07/10/2023 CLINICAL DATA:  Shortness of breath. EXAM: PORTABLE CHEST 1 VIEW COMPARISON:  Chest radiograph dated 11/02/2021. FINDINGS: No focal consolidation, pleural effusion or pneumothorax. The cardiac silhouette is within normal limits. Aortic valve repair. Atherosclerotic calcification of the aorta. No acute osseous pathology. IMPRESSION: No active disease. Electronically Signed   By: Vanetta Chou M.D.   On: 07/10/2023 12:37    Labs:  CBC: Recent Labs    07/07/23 0858 07/10/23 1121 07/24/23 0738 07/25/23 1053  WBC 13.0* 12.6* 12.1* 15.6*  HGB 11.5* 11.0* 9.3* 9.6*  HCT 34.6* 33.8* 28.9* 29.5*  PLT 332 334 358 321    COAGS: Recent Labs    07/10/23 1517 07/24/23 0738   INR 1.4* 1.1  APTT 42*  --     BMP: Recent Labs    07/07/23 0858 07/10/23 1121 07/25/23 1053 07/30/23 1032  NA 136 137 131* 132*  K 4.1 3.9 4.2 4.5  CL 102 104 101 101  CO2 18* 22 22 24   GLUCOSE 120* 151* 109* 111*  BUN 15 15 17 14   CALCIUM  9.2 8.9 8.7* 9.0  CREATININE 1.00 1.06 0.96 0.96  GFRNONAA  --  >60 >60 >60    LIVER FUNCTION TESTS: Recent Labs    07/07/23 0858 07/10/23 1121 07/25/23 1053 07/30/23 1032  BILITOT 0.7 0.9 1.3* 0.8  AST 97* 70* 45* 75*  ALT 58* 52* 28 53*  ALKPHOS 228* 162* 150* 174*  PROT 6.8 7.1 7.1 7.4  ALBUMIN 4.0 3.2* 3.2* 3.2*    TUMOR MARKERS: No results for input(s): AFPTM, CEA, CA199, CHROMGRNA in the last 8760 hours.  Assessment and Plan:  Peritoneal Carcinomatosis: Jon Mejia is a 83 y.o. male known to IR from previous biopsy. Biopsy results positive for metastatic carcinoma. Patient presents today for port-a-catheter insertion with Dr. KANDICE Moan. Procedure to be performed under moderate sedation.  Risks and benefits of image guided port-a-catheter placement was discussed with the patient including, but not limited to bleeding, infection, pneumothorax, or fibrin sheath development and need for additional procedures.  All of the patient's questions were answered, patient is agreeable to proceed. Consent signed and in chart.   Thank you for this interesting consult. I greatly enjoyed meeting Jon Mejia and look forward to participating in their care. A copy of this report was sent to the requesting provider on this date.  Electronically Signed: Glennon CHRISTELLA Bal, PA-C 08/07/2023, 10:59 AM   I spent a total of 10 Minutes in face to face clinical consultation, greater than 50% of which was counseling/coordinating care for port-a-catheter placement.

## 2023-08-07 NOTE — Discharge Instructions (Addendum)
 How to Care for Your Port at Home  Keep the Area Clean and Dry  Always wash your hands with soap and water before touching your port or the area around it.  Do not touch the port site unless necessary.  Keep the dressing (bandage) over your port clean and dry at all times. If the dressing becomes wet, dirty, or starts to peel off, contact your healthcare team for instructions on changing it.  You do not need to bandage the site after the first 24-48hrs unless you expect the area to become soiled; then we recommend covering it for additional protection.  Bathing and Showering  Do not submerge your port or the dressing in water (no swimming, hot tubs, or baths).  You may take your first shower tomorrow afternoon. Remember to keep the bandage on while you are showering. After the shower, remove the bandage and either leave the area uncovered or place a clean, dry bandage on top  Once the incision site is fully healed, you do not need to keep the area covered while you shower.  Signs of Problems: When to Call for Help  Call your healthcare team right away if you notice:  Redness, swelling, pain, or warmth at the port site  Drainage (pus or blood) from the port site  Fever or chills  Bleeding that does not stop after gentle pressure  The port or catheter appears to have moved or becomes exposed  You feel pain, burning, or discomfort when the port is used  Feel free to reach out to our team at [phone] if you have any additional questions or concerns.

## 2023-08-10 DIAGNOSIS — C801 Malignant (primary) neoplasm, unspecified: Secondary | ICD-10-CM | POA: Diagnosis not present

## 2023-08-11 ENCOUNTER — Encounter: Payer: Self-pay | Admitting: Oncology

## 2023-08-11 ENCOUNTER — Inpatient Hospital Stay: Admitting: Oncology

## 2023-08-11 ENCOUNTER — Inpatient Hospital Stay

## 2023-08-11 ENCOUNTER — Other Ambulatory Visit: Payer: Self-pay | Admitting: *Deleted

## 2023-08-11 VITALS — BP 119/67 | HR 96 | Temp 98.3°F | Resp 16 | Ht 65.0 in | Wt 158.0 lb

## 2023-08-11 DIAGNOSIS — C786 Secondary malignant neoplasm of retroperitoneum and peritoneum: Secondary | ICD-10-CM

## 2023-08-11 DIAGNOSIS — C221 Intrahepatic bile duct carcinoma: Secondary | ICD-10-CM

## 2023-08-11 DIAGNOSIS — R160 Hepatomegaly, not elsewhere classified: Secondary | ICD-10-CM | POA: Diagnosis not present

## 2023-08-11 DIAGNOSIS — Z5112 Encounter for antineoplastic immunotherapy: Secondary | ICD-10-CM | POA: Diagnosis not present

## 2023-08-11 LAB — CBC WITH DIFFERENTIAL (CANCER CENTER ONLY)
Abs Immature Granulocytes: 0.11 K/uL — ABNORMAL HIGH (ref 0.00–0.07)
Basophils Absolute: 0.1 K/uL (ref 0.0–0.1)
Basophils Relative: 1 %
Eosinophils Absolute: 0.1 K/uL (ref 0.0–0.5)
Eosinophils Relative: 1 %
HCT: 23.3 % — ABNORMAL LOW (ref 39.0–52.0)
Hemoglobin: 7.4 g/dL — ABNORMAL LOW (ref 13.0–17.0)
Immature Granulocytes: 1 %
Lymphocytes Relative: 10 %
Lymphs Abs: 1.6 K/uL (ref 0.7–4.0)
MCH: 26.7 pg (ref 26.0–34.0)
MCHC: 31.8 g/dL (ref 30.0–36.0)
MCV: 84.1 fL (ref 80.0–100.0)
Monocytes Absolute: 1.6 K/uL — ABNORMAL HIGH (ref 0.1–1.0)
Monocytes Relative: 10 %
Neutro Abs: 12.7 K/uL — ABNORMAL HIGH (ref 1.7–7.7)
Neutrophils Relative %: 77 %
Platelet Count: 400 K/uL (ref 150–400)
RBC: 2.77 MIL/uL — ABNORMAL LOW (ref 4.22–5.81)
RDW: 14.5 % (ref 11.5–15.5)
WBC Count: 16.1 K/uL — ABNORMAL HIGH (ref 4.0–10.5)
nRBC: 0 % (ref 0.0–0.2)

## 2023-08-11 LAB — TSH: TSH: 1.487 u[IU]/mL (ref 0.350–4.500)

## 2023-08-11 LAB — CMP (CANCER CENTER ONLY)
ALT: 26 U/L (ref 0–44)
AST: 46 U/L — ABNORMAL HIGH (ref 15–41)
Albumin: 3 g/dL — ABNORMAL LOW (ref 3.5–5.0)
Alkaline Phosphatase: 184 U/L — ABNORMAL HIGH (ref 38–126)
Anion gap: 5 (ref 5–15)
BUN: 17 mg/dL (ref 8–23)
CO2: 21 mmol/L — ABNORMAL LOW (ref 22–32)
Calcium: 8.9 mg/dL (ref 8.9–10.3)
Chloride: 107 mmol/L (ref 98–111)
Creatinine: 0.9 mg/dL (ref 0.61–1.24)
GFR, Estimated: >60 mL/min (ref >60)
Glucose, Bld: 137 mg/dL — ABNORMAL HIGH (ref 70–99)
Potassium: 3.6 mmol/L (ref 3.5–5.1)
Sodium: 133 mmol/L — ABNORMAL LOW (ref 135–145)
Total Bilirubin: 0.8 mg/dL (ref 0.0–1.2)
Total Protein: 7 g/dL (ref 6.5–8.1)

## 2023-08-11 LAB — MAGNESIUM: Magnesium: 2 mg/dL (ref 1.7–2.4)

## 2023-08-11 LAB — PREPARE RBC (CROSSMATCH)

## 2023-08-11 MED FILL — Fosaprepitant Dimeglumine For IV Infusion 150 MG (Base Eq): INTRAVENOUS | Qty: 5 | Status: AC

## 2023-08-11 NOTE — Progress Notes (Unsigned)
 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    INTERVAL HISTORY: Patient returns to clinic today for further evaluation, discussion of his PET scan and biopsy results, and treatment planning.  He continues to have vague abdominal pain, but this is well-controlled with oxycodone .  He has a poor appetite, but otherwise feels well.  He has no neurologic complaints.  He denies any recent fevers or illnesses.  He has no chest pain, shortness of breath, cough, or hemoptysis.  He denies any nausea, vomiting, constipation, or diarrhea.  He has no melena or hematochezia.  He has no urinary complaints.  Patient offers no further specific complaints today.  REVIEW OF SYSTEMS:   Review of Systems  Constitutional: Negative.  Negative for fever, malaise/fatigue and weight loss.  Respiratory: Negative.  Negative for cough, hemoptysis and shortness of breath.   Cardiovascular: Negative.  Negative for chest pain and leg swelling.  Gastrointestinal:  Positive for abdominal pain. Negative for blood in stool, constipation, diarrhea, melena, nausea and vomiting.  Genitourinary: Negative.  Negative for dysuria.  Musculoskeletal: Negative.  Negative for back pain.  Skin: Negative.  Negative for rash.  Neurological: Negative.  Negative for dizziness, focal weakness, weakness and headaches.  Psychiatric/Behavioral: Negative.  The patient is not nervous/anxious.     As  per HPI. Otherwise, a complete review of systems is negative.  PAST MEDICAL HISTORY: Past Medical History:  Diagnosis Date  . Angina pectoris (HCC)   . Aortic atherosclerosis (HCC)   . Aortic stenosis    a.) TTE 03/14/2018: mod AS (MPG 14); b.) TTE 09/13/2019: mild AS (MPG 12.5); c.) TTE 08/16/2021: mod AS (MPG 18.5); d.) R/LHC 09/17/2021: mRA 3, mPA 19, mPCWP 11, LVEDP 15, CO 5.22, CI 2.75; e.) s/p TAVR 11/06/2021  . CAD (coronary artery disease) 09/17/2021   a.) [facility] 09/17/2021: 30% pRCA, 40% dRCA, 50% pLCx, 30% dLCx, 50% pLAD, 50% mLAD, 50% dLAD --> med mgmt  . Cardiac murmur   . Cervical spondylosis   . Cholelithiasis   . CRAO (central retinal artery occlusion), 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  . CVA (cerebral vascular accident) (HCC) 03/13/2018   a.) MRI brain 03/14/2018 --> acute infarct in the posterior RIGHT frontal lobe; small chronic cerebellar infarct --> Tx'd with TPA with (+) improvement/resolution of symptoms  . Diastolic dysfunction    a.) TTE 01/31/2015: EF 55-60%, MAC, G1DD; b.) TTE 03/14/2018: EF 60-65%, mild LAE, sev MAC, sev AoV cal with mod AS (MPG 14) G2DD; c.) TTE 09/13/2019: EF 55-60%, mild LAE, mild-mod MR, mild AS (MPG 12.5), G2DD; d.) TTE 08/16/2021: EF 60-65%, mild LVH, mod MAC, mild MR, mod AS (MPG 18.5), G1DD; e.) TTE 12/05/2021: EF 60-65%, mod MAC, s/p TAVR (well seated/functioning valve), mild MR, G1DD  . GERD (gastroesophageal reflux disease)   . History of chicken pox   . History of measles   . Hyperlipidemia   . Hypertension   . LBBB (left bundle branch block)   . Long term current use of anticoagulant    a.) apixaban   . Low back pain  radiating to left lower extremity   . PAF (paroxysmal atrial fibrillation) (HCC)    a.) CHA2DS2VASc = 6 (age x2, HTN, CVA x2, vascular disease history);  b.) rate/rhythm maintained on oral metoprolol  succinate; chronically anticoagulated with apixaban   . 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 Skin cancer     PAST SURGICAL HISTORY: Past Surgical History:  Procedure Laterality Date  . INTRAOPERATIVE TRANSTHORACIC ECHOCARDIOGRAM N/A 11/06/2021   Procedure: INTRAOPERATIVE TRANSTHORACIC ECHOCARDIOGRAM;  Surgeon: Verlin Lonni BIRCH, MD;  Location: [facility] OR;  Service: Open Heart Surgery;  Laterality: N/A;  . IR IMAGING GUIDED PORT INSERTION  08/07/2023  . RIGHT/LEFT HEART CATH AND CORONARY ANGIOGRAPHY N/A 09/17/2021   Procedure: RIGHT/LEFT HEART CATH AND CORONARY ANGIOGRAPHY;  Surgeon: Verlin Lonni BIRCH, MD;  Location: [facility] INVASIVE CV LAB;  Service: Cardiovascular;  Laterality: N/A;  . ROBOTIC ASSISTED LAPAROSCOPIC CHOLECYSTECTOMY  07/24/2022  . SKIN CANCER EXCISION  07/2020  . TRANSCATHETER AORTIC VALVE REPLACEMENT, TRANSFEMORAL N/A 11/06/2021   Procedure: Transcatheter Aortic Valve Replacement, Transfemoral;  Surgeon: Verlin Lonni BIRCH, MD;  Location: [facility] OR;  Service: Open Heart Surgery;  Laterality: N/A;    FAMILY HISTORY: Family History  Problem Relation Age of Onset  . Arrhythmia Mother        A-Fib  . Lung cancer Father   . Diabetes Son   . Colon cancer Neg Hx   . Prostate cancer Neg Hx     ADVANCED DIRECTIVES (Y/N):  N  HEALTH MAINTENANCE: Social History   Tobacco Use  . Smoking status: Former    Current packs/day: 0.00    Average packs/day: 1 pack/day for 20.0 years (20.0 ttl pk-yrs)    Types: Cigarettes    Start date: 03/12/1963    Quit date: 03/12/1983    Years since quitting: 40.4    Passive exposure: Past  . Smokeless tobacco: Never  Vaping Use  . Vaping status: Never Used  Substance Use Topics  . Alcohol use: No  . Drug use: No     Colonoscopy:  PAP:  Bone density:  Lipid panel:  No Known Allergies  Current Outpatient Medications  Medication Sig Dispense Refill  . amLODipine  (NORVASC ) 10 MG tablet Take 1 tablet (10 mg total) by mouth daily. 90 tablet 3  . amoxicillin -clavulanate (AUGMENTIN ) 875-125 MG tablet  Take 1 tablet by mouth 2 (two) times daily. 10 tablet 0  . apixaban  (ELIQUIS ) 5 MG TABS tablet TAKE ONE (1) TABLET BY MOUTH TWO TIMES PER DAYTAKE ONE (1) TABLET BY MOUTH TWO TIMES PER DAY, may resume on Friday AM, June 28. 180 tablet 1  . atorvastatin  (LIPITOR) 80 MG tablet TAKE ONE TABLET BY MOUTH ONCE DAILY 30 tablet 3  . ibuprofen  (ADVIL ) 800 MG tablet Take 1 tablet (800 mg total) by mouth every 8 (eight) hours as needed. 30 tablet 0  . lidocaine -prilocaine  (EMLA ) cream Apply to affected area once 30 g 3  . metoprolol  succinate (TOPROL -XL) 25 MG 24 hr tablet TAKE ONE AND ONE-HALF TABLETS DAILY 45 tablet 3  . ondansetron  (ZOFRAN ) 4 MG tablet Take 1 tablet (4 mg total) by mouth every 8 (eight) hours as needed. 15 tablet 0  . ondansetron  (ZOFRAN ) 8 MG tablet Take 1 tablet (8 mg total) by mouth every 8 (eight) hours as needed for nausea or vomiting. Start on the third day after cisplatin . 60 tablet 2  . oxyCODONE  (ROXICODONE ) 5 MG immediate release tablet Take 1 tablet (5  mg total) by mouth every 6 (six) hours as needed. 30 tablet 0  . prochlorperazine  (COMPAZINE ) 10 MG tablet Take 1 tablet (10 mg total) by mouth every 6 (six) hours as needed (Nausea or vomiting). 60 tablet 2  . senna-docusate (SENOKOT-S) 8.6-50 MG tablet Take 1 tablet by mouth daily. 30 tablet 0  . amoxicillin  (AMOXIL ) 500 MG tablet Take 4 tablets (2,000 mg total) by mouth as directed. 1 hour prior to dental work including cleanings (Patient not taking: Reported on 08/11/2023) 12 tablet 12  . Cyanocobalamin (B-12) 3000 MCG CAPS Take 3,000 mcg by mouth daily as needed (energy). (Patient not taking: Reported on 08/11/2023)     No current facility-administered medications for this visit.    OBJECTIVE: Vitals:   08/11/23 0936  BP: 119/67  Pulse: 96  Resp: 16  Temp: 98.3 F (36.8 C)  SpO2: 99%     Body mass index is 26.29 kg/m.    ECOG FS:1 - Symptomatic but completely ambulatory  General: Well-developed, well-nourished, no  acute distress. Eyes: Pink conjunctiva, anicteric sclera. HEENT: Normocephalic, moist mucous membranes. Lungs: No audible wheezing or coughing. Heart: Regular rate and rhythm. Abdomen: Soft, nontender, no obvious distention. Musculoskeletal: No edema, cyanosis, or clubbing. Neuro: Alert, answering all questions appropriately. Cranial nerves grossly intact. Skin: No rashes or petechiae noted. Psych: Normal affect.  LAB RESULTS:  Lab Results  Component Value Date   NA 133 (L) 08/11/2023   K 3.6 08/11/2023   CL 107 08/11/2023   CO2 21 (L) 08/11/2023   GLUCOSE 137 (H) 08/11/2023   BUN 17 08/11/2023   CREATININE 0.90 08/11/2023   CALCIUM  8.9 08/11/2023   PROT 7.0 08/11/2023   ALBUMIN 3.0 (L) 08/11/2023   AST 46 (H) 08/11/2023   ALT 26 08/11/2023   ALKPHOS 184 (H) 08/11/2023   BILITOT 0.8 08/11/2023   GFRNONAA >60 08/11/2023   GFRAA 81 08/25/2019    Lab Results  Component Value Date   WBC 16.1 (H) 08/11/2023   NEUTROABS 12.7 (H) 08/11/2023   HGB 7.4 (L) 08/11/2023   HCT 23.3 (L) 08/11/2023   MCV 84.1 08/11/2023   PLT 400 08/11/2023     STUDIES: IR IMAGING GUIDED PORT INSERTION Result Date: 08/07/2023 CLINICAL DATA:  Peritoneal carcinomatosis and need for porta cath to begin chemotherapy. EXAM: IMPLANTED PORT A CATH PLACEMENT WITH ULTRASOUND AND FLUOROSCOPIC GUIDANCE ANESTHESIA/SEDATION: Moderate (conscious) sedation was employed during this procedure. A total of Versed  2.0 mg and Fentanyl  during mcg was administered intravenously. Moderate Sedation Time: 42 minutes. The patient's level of consciousness and vital signs were monitored continuously by radiology nursing throughout the procedure under my direct supervision. Additional Medications: 2 g IV Ancef . FLUOROSCOPY: Radiation Exposure Index: 21.2 mGy Kerma PROCEDURE: The procedure, risks, benefits, and alternatives were explained to the patient. Questions regarding the procedure were encouraged and answered. The patient  understands and consents to the procedure. A time-out was performed prior to initiating the procedure. Ultrasound was utilized to confirm patency of the right internal jugular vein. An ultrasound image was saved and recorded. The right neck and chest were prepped with chlorhexidine  in a sterile fashion, and a sterile drape was applied covering the operative field. Maximum barrier sterile technique with sterile gowns and gloves were used for the procedure. Local anesthesia was provided with 1% lidocaine . After creating a small venotomy incision, a 21 gauge needle was advanced into the right internal jugular vein under direct, real-time ultrasound guidance. Ultrasound image documentation was performed. After securing guidewire access,  an 8 Fr dilator was placed. A J-wire was kinked to measure appropriate catheter length. A subcutaneous port pocket was then created along the upper chest wall utilizing sharp and blunt dissection. Portable cautery was utilized. The pocket was irrigated with sterile saline. A single lumen power injectable port was chosen for placement. The 8 Fr catheter was tunneled from the port pocket site to the venotomy incision. The port was placed in the pocket. External catheter was trimmed to appropriate length based on guidewire measurement. At the venotomy, an 8 Fr peel-away sheath was placed over a guidewire. The catheter was then placed through the sheath and the sheath removed. Final catheter positioning was confirmed and documented with a fluoroscopic spot image. The port was accessed with a needle and aspirated and flushed with heparinized saline. The access needle was removed. The venotomy and port pocket incisions were closed with subcutaneous 3-0 Monocryl and subcuticular 4-0 Vicryl. Dermabond was applied to both incisions. COMPLICATIONS: COMPLICATIONS None FINDINGS: After catheter placement, the tip lies at the cavo-atrial junction. The catheter aspirates normally and is ready for  immediate use. IMPRESSION: Placement of single lumen port a cath via right internal jugular vein. The catheter tip lies at the cavo-atrial junction. A power injectable port a cath was placed and is ready for immediate use. Electronically Signed   By: Marcey Moan M.D.   On: 08/07/2023 13:26   CT ABDOMINAL MASS BIOPSY Result Date: 07/24/2023 CLINICAL DATA:  Multiple liver metastases and diffuse peritoneal and omental tumor with no known primary carcinoma. The patient presents for tissue biopsy. EXAM: CT GUIDED CORE BIOPSY OF PERITONEAL MASS ANESTHESIA/SEDATION: Moderate (conscious) sedation was employed during this procedure. A total of Versed  1.0 mg and Fentanyl  100 mcg was administered intravenously. Moderate Sedation Time: 15 minutes. The patient's level of consciousness and vital signs were monitored continuously by radiology nursing throughout the procedure under my direct supervision. PROCEDURE: The procedure risks, benefits, and alternatives were explained to the patient. Questions regarding the procedure were encouraged and answered. The patient understands and consents to the procedure. A time out was performed prior to initiating the procedure. RADIATION DOSE REDUCTION: This exam was performed according to the departmental dose-optimization program which includes automated exposure control, adjustment of the mA and/or kV according to patient size and/or use of iterative reconstruction technique. The left abdominal wall was prepped with chlorhexidine  in a sterile fashion, and a sterile drape was applied covering the operative field. A sterile gown and sterile gloves were used for the procedure. Local anesthesia was provided with 1% Lidocaine . CT was performed in a supine position through the mid to lower abdomen. After localizing peritoneal tumor in the left lateral anterior peritoneal cavity, a 17 gauge trocar needle was advanced under CT guidance to the level of tumor mass. Three separate coaxial 18  gauge core biopsy samples were obtained and submitted in formalin. A slurry of Gel-Foam EmboCubes was then injected via the outer needle prior to its retraction and removal. Additional CT was performed. COMPLICATIONS: None FINDINGS: Bulky anterior peritoneal tumor just deep to the left lateral abdominal wall was chosen for sampling with solid tissue obtained. IMPRESSION: CT-guided core biopsy performed of peritoneal tumor in the left anterior peritoneal cavity. Electronically Signed   By: Marcey Moan M.D.   On: 07/24/2023 09:53   NM PET Image Initial (PI) Skull Base To Thigh Result Date: 07/15/2023 CLINICAL DATA:  Initial treatment strategy for liver masses. Peritoneal carcinomatosis. EXAM: NUCLEAR MEDICINE PET SKULL BASE TO THIGH TECHNIQUE:  9.7 mCi F-18 FDG was injected intravenously. Full-ring PET imaging was performed from the skull base to thigh after the radiotracer. CT data was obtained and used for attenuation correction and anatomic localization. Fasting blood glucose: 101 mg/dl COMPARISON:  Abdomen pelvis CT with contrast 07/10/2023 FINDINGS: Mediastinal blood pool activity: SUV max 2.5 Liver activity: SUV max 2.6 NECK: No specific abnormal uptake in the neck including along lymph node change of the submandibular, posterior triangle or internal jugular region. Near symmetric uptake of the visualized intracranial compartment. Incidental CT findings: The parotid glands, submandibular glands unremarkable. Small thyroid  gland. Paranasal sinuses and mastoid air cells are clear. Nasal septal deviation. Streak artifact related to the patient's dental hardware. Scattered vascular calcifications. CHEST: No abnormal uptake above blood pool in the axillary regions, hilum or mediastinum. Mild uptake along the lower esophagus, nonspecific. There is a small hiatal hernia. No abnormal uptake along the lung parenchyma. Incidental CT findings: Status post TAVR. No pericardial effusion. Borderline size heart.  Scattered vascular calcifications are seen including along the coronary arteries and great vessels. Possible areas of significant stenosis along the subclavian arteries bilaterally. Please correlate with any symptoms and if needed dedicated CTA as clinically appropriate. Breathing motion. There is some linear opacity identified dependently along the lungs likely scar or atelectasis. The there are some small pre cardiophrenic nodes on image 63 which are under 5 mm in short axis. No abnormal uptake. ABDOMEN/PELVIS: Numerous hypermetabolic liver masses are identified. At least 30 lesions are identified. The majority are small under 2 cm. Example 0 1 larger foci inferior right hepatic lobe measures 2.1 x 1.8 cm on image 80 of the CT scan and has maximum SUV of 8.5. Larger focus in segment 4A has maximum SUV of 7.7 and on image 67 is anterior lesion measures 2.8 by 1.9 cm. Distribution on PET images show some lesions being target like. All segments are affected. In addition peritoneal carcinomatosis identified with nodular soft tissue seen anteriorly which is all hypermetabolic. Example on the right side of the abdomen has maximum SUV of 9.3 on image 102. Left side on the same image has maximum SUV 9.5. Otherwise there is physiologic distribution of tracer along other parenchymal organs, renal collecting systems and bowel. There is some scattered ascites identified. Few prominent nodes identified right upper quadrant. Example node anterior to the head of the pancreas on image 81 measures 10 by 19 mm. This stone has low-level uptake however maximum SUV of 3.2. Attention on follow-up. Few other nodes as well similar. Incidental CT findings: Diffuse vascular calcifications identified. Normal caliber aorta and IVC. No renal or ureteral stones are identified. Underdistended urinary bladder. Prominent prostate without uptake. Grossly the spleen, pancreas and adrenal glands are preserved. Liver margin is slightly nodular. This  could be pseudo cirrhosis. SKELETON: No abnormal uptake along the visualized osseous structures. Incidental CT findings: Scattered degenerative changes. 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 In addition there are numerous hypermetabolic liver lesions identified. Appearance is somewhat target like by PET-CT. Few prominent upper abdominal lymph nodes identified with some minimal uptake. Attention on follow-up. No areas of abnormal uptake seen above the diaphragm. Small hiatal hernia. Diffuse vascular calcifications. Status post TAVR. Electronically Signed   By: Ranell Bring M.D.   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  PET scan on July 15, 2023 revealed greater than 30 liver lesions as well as carcinomatosis.  Subsequent biopsy a poorly differentiated carcinoma consistent with either upper GI or pancreatobiliary origin.  Tumor markers are negative.  NGS and tissue enlargement testing is pending at time of dictation.  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  This will be a 21-day cycle.  Patient will require port placement prior to initiating treatment.  Return to clinic in approximately 2 weeks to initiate cycle 1, day 1.   Hyponatremia: Chronic and unchanged.  Patient sodium level is 132. Transaminitis: Mild, monitor. Hyperbilirubinemia: Resolved. Anemia: Patient's hemoglobin has trended down to 9.6, monitor. Leukocytosis: Likely reactive,  monitor.   Patient expressed understanding and was in agreement with this plan. He also understands that He can call clinic at any time with any questions, concerns, or complaints.    Cancer Staging  No matching staging information was found for the patient.   Evalene JINNY Reusing, MD   08/11/2023 10:03 AM

## 2023-08-11 NOTE — Progress Notes (Unsigned)
 Patient has been short of breath, ongoing for a while is getting worse. Blood pressure has dropped lower than it has been. Very weak. Pain near were his liver is.

## 2023-08-12 ENCOUNTER — Encounter: Payer: Self-pay | Admitting: Oncology

## 2023-08-12 ENCOUNTER — Ambulatory Visit

## 2023-08-12 ENCOUNTER — Other Ambulatory Visit

## 2023-08-12 ENCOUNTER — Ambulatory Visit: Admitting: Oncology

## 2023-08-12 ENCOUNTER — Telehealth: Payer: Self-pay

## 2023-08-12 ENCOUNTER — Inpatient Hospital Stay

## 2023-08-12 VITALS — BP 102/64 | HR 82 | Temp 97.9°F | Resp 16

## 2023-08-12 DIAGNOSIS — C221 Intrahepatic bile duct carcinoma: Secondary | ICD-10-CM | POA: Insufficient documentation

## 2023-08-12 DIAGNOSIS — Z5112 Encounter for antineoplastic immunotherapy: Secondary | ICD-10-CM | POA: Diagnosis not present

## 2023-08-12 DIAGNOSIS — C786 Secondary malignant neoplasm of retroperitoneum and peritoneum: Secondary | ICD-10-CM

## 2023-08-12 LAB — T4: T4, Total: 8.4 ug/dL (ref 4.5–12.0)

## 2023-08-12 MED ORDER — DEXAMETHASONE SODIUM PHOSPHATE 10 MG/ML IJ SOLN
10.0000 mg | Freq: Once | INTRAMUSCULAR | Status: AC
  Administered 2023-08-12: 10 mg via INTRAVENOUS
  Filled 2023-08-12: qty 1

## 2023-08-12 MED ORDER — SODIUM CHLORIDE 0.9 % IV SOLN
1500.0000 mg | Freq: Once | INTRAVENOUS | Status: AC
  Administered 2023-08-12: 1500 mg via INTRAVENOUS
  Filled 2023-08-12: qty 30

## 2023-08-12 MED ORDER — POTASSIUM CHLORIDE IN NACL 20-0.9 MEQ/L-% IV SOLN
Freq: Once | INTRAVENOUS | Status: AC
  Filled 2023-08-12: qty 1000

## 2023-08-12 MED ORDER — MAGNESIUM SULFATE 2 GM/50ML IV SOLN
2.0000 g | Freq: Once | INTRAVENOUS | Status: AC
  Administered 2023-08-12: 2 g via INTRAVENOUS
  Filled 2023-08-12: qty 50

## 2023-08-12 MED ORDER — SODIUM CHLORIDE 0.9 % IV SOLN
1000.0000 mg/m2 | Freq: Once | INTRAVENOUS | Status: AC
  Administered 2023-08-12: 1862 mg via INTRAVENOUS
  Filled 2023-08-12: qty 48.97

## 2023-08-12 MED ORDER — FOSAPREPITANT DIMEGLUMINE INJECTION 150 MG
150.0000 mg | Freq: Once | INTRAVENOUS | Status: AC
  Administered 2023-08-12: 150 mg via INTRAVENOUS
  Filled 2023-08-12: qty 150

## 2023-08-12 MED ORDER — HEPARIN SOD (PORK) LOCK FLUSH 100 UNIT/ML IV SOLN
500.0000 [IU] | Freq: Once | INTRAVENOUS | Status: AC | PRN
Start: 2023-08-12 — End: 2023-08-12
  Administered 2023-08-12: 500 [IU]
  Filled 2023-08-12: qty 5

## 2023-08-12 MED ORDER — SODIUM CHLORIDE 0.9 % IV SOLN
INTRAVENOUS | Status: DC
  Filled 2023-08-12: qty 250

## 2023-08-12 MED ORDER — PALONOSETRON HCL INJECTION 0.25 MG/5ML
0.2500 mg | Freq: Once | INTRAVENOUS | Status: AC
  Administered 2023-08-12: 0.25 mg via INTRAVENOUS
  Filled 2023-08-12: qty 5

## 2023-08-12 MED ORDER — SODIUM CHLORIDE 0.9 % IV SOLN
25.0000 mg/m2 | Freq: Once | INTRAVENOUS | Status: AC
  Administered 2023-08-12: 50 mg via INTRAVENOUS
  Filled 2023-08-12: qty 50

## 2023-08-12 NOTE — Patient Instructions (Signed)
 [facility] - A DEPT OF [place]  Discharge Instructions: Thank you for choosing [facility] to provide your oncology and hematology care.  If you have a lab appointment with the Cancer Center, please go directly to the Cancer Center and check in at the registration area.  Wear comfortable clothing and clothing appropriate for easy access to any Portacath or PICC line.   We strive to give you quality time with your provider. You may need to reschedule your appointment if you arrive late (15 or more minutes).  Arriving late affects you and other patients whose appointments are after yours.  Also, if you miss three or more appointments without notifying the office, you may be dismissed from the clinic at the provider's discretion.      For prescription refill requests, have your pharmacy contact our office and allow 72 hours for refills to be completed.    Today you received the following chemotherapy and/or immunotherapy agents Imfinzi , Gemzar  & Cisplatin       To help prevent nausea and vomiting after your treatment, we encourage you to take your nausea medication as directed.  BELOW ARE SYMPTOMS THAT SHOULD BE REPORTED IMMEDIATELY: *FEVER GREATER THAN 100.4 F (38 C) OR HIGHER *CHILLS OR SWEATING *NAUSEA AND VOMITING THAT IS NOT CONTROLLED WITH YOUR NAUSEA MEDICATION *UNUSUAL SHORTNESS OF BREATH *UNUSUAL BRUISING OR BLEEDING *URINARY PROBLEMS (pain or burning when urinating, or frequent urination) *BOWEL PROBLEMS (unusual diarrhea, constipation, pain near the anus) TENDERNESS IN MOUTH AND THROAT WITH OR WITHOUT PRESENCE OF ULCERS (sore throat, sores in mouth, or a toothache) UNUSUAL RASH, SWELLING OR PAIN  UNUSUAL VAGINAL DISCHARGE OR ITCHING   Items with * indicate a potential emergency and should be followed up as soon as possible or go to the Emergency Department if any problems should occur.  Please show the CHEMOTHERAPY ALERT CARD  or IMMUNOTHERAPY ALERT CARD at check-in to the Emergency Department and triage nurse.  Should you have questions after your visit or need to cancel or reschedule your appointment, please contact [facility] - A DEPT OF [facility]  [phone] and follow the prompts.  Office hours are 8:00 a.m. to 4:30 p.m. Monday - Friday. Please note that voicemails left after 4:00 p.m. may not be returned until the following business day.  We are closed weekends and major holidays. You have access to a nurse at all times for urgent questions. Please call the main number to the clinic [phone] and follow the prompts.  For any non-urgent questions, you may also contact your provider using MyChart. We now offer e-Visits for anyone 48 and older to request care online for non-urgent symptoms. For details visit [email or web address].   Also download the MyChart app! Go to the app store, search MyChart, open the app, select [place], and log in with your MyChart username and password.

## 2023-08-12 NOTE — Progress Notes (Signed)
   08/12/2023  Patient ID: Jon Mejia, male   DOB: 08/19/1940, 83 y.o.   MRN: [id number]  This patient is appearing on a report for being at risk of failing the adherence measure for identified medications this calendar year. HIPAA identifiers were confirmed during patient outreach today which was accompanied by his spouse.   Medication Adherence Summary (STAR/HEDIS Monitoring): Adherence Category: cholesterol (statin)    Drug Name: Atorvastatin  80 mg Last Fill or Sold Date:07/11/2023 Days' Supply: 30     Notes: ? Adherence data not pulled from pharmacy claims portal Dr. Annemarie. I had to call [facility] to get fill history.  ? I called patient stated that he would like to get atorvastatin  refills. His wife asked to refill any medications that need refilling. She also stated that since patient is going through cancer treatment, he may be delayed in getting medications refilled.  ? Reviewed barriers to adherence: health related issues. ? Plan: Contacted pharmacy, again, to facilitate refills. Will follow up to verify medications refilled. Of note, metoprolol  and amlodipine  were refilled.   Dorcas Solian, PharmD Clinical Pharmacist Cell: [phone]

## 2023-08-13 ENCOUNTER — Telehealth: Payer: Self-pay

## 2023-08-13 ENCOUNTER — Other Ambulatory Visit: Payer: Self-pay

## 2023-08-13 NOTE — Telephone Encounter (Signed)
Telephone call to patient for follow up after receiving first infusion.   Patient wife states infusion went great.  States eating good and drinking plenty of fluids.   Denies any nausea or vomiting.  Encouraged patient wife to call for any concerns or questions.

## 2023-08-14 ENCOUNTER — Inpatient Hospital Stay

## 2023-08-14 DIAGNOSIS — Z5112 Encounter for antineoplastic immunotherapy: Secondary | ICD-10-CM | POA: Diagnosis not present

## 2023-08-14 DIAGNOSIS — C786 Secondary malignant neoplasm of retroperitoneum and peritoneum: Secondary | ICD-10-CM

## 2023-08-14 MED ORDER — SODIUM CHLORIDE 0.9% FLUSH
10.0000 mL | INTRAVENOUS | Status: AC | PRN
  Administered 2023-08-14: 10 mL
  Filled 2023-08-14: qty 10

## 2023-08-14 MED ORDER — DIPHENHYDRAMINE HCL 50 MG/ML IJ SOLN
25.0000 mg | Freq: Once | INTRAMUSCULAR | Status: AC
  Administered 2023-08-14: 25 mg via INTRAVENOUS
  Filled 2023-08-14: qty 1

## 2023-08-14 MED ORDER — SODIUM CHLORIDE 0.9% IV SOLUTION
250.0000 mL | INTRAVENOUS | Status: DC
  Administered 2023-08-14: 100 mL via INTRAVENOUS
  Filled 2023-08-14: qty 250

## 2023-08-14 MED ORDER — ACETAMINOPHEN 325 MG PO TABS
650.0000 mg | ORAL_TABLET | Freq: Once | ORAL | Status: AC
  Administered 2023-08-14: 650 mg via ORAL
  Filled 2023-08-14: qty 2

## 2023-08-14 MED ORDER — HEPARIN SOD (PORK) LOCK FLUSH 100 UNIT/ML IV SOLN
500.0000 [IU] | Freq: Every day | INTRAVENOUS | Status: AC | PRN
  Administered 2023-08-14: 500 [IU]
  Filled 2023-08-14: qty 5

## 2023-08-14 NOTE — Patient Instructions (Signed)

## 2023-08-15 ENCOUNTER — Encounter: Payer: Self-pay | Admitting: Oncology

## 2023-08-15 LAB — TYPE AND SCREEN
ABO/RH(D): O POS
Antibody Screen: NEGATIVE
Unit division: 0

## 2023-08-15 LAB — BPAM RBC
Blood Product Expiration Date: 202508092359
ISSUE DATE / TIME: 202507171318
Unit Type and Rh: 5100

## 2023-08-18 ENCOUNTER — Encounter: Payer: Self-pay | Admitting: Oncology

## 2023-08-18 ENCOUNTER — Inpatient Hospital Stay: Admitting: Nurse Practitioner

## 2023-08-18 ENCOUNTER — Telehealth: Payer: Self-pay

## 2023-08-18 ENCOUNTER — Inpatient Hospital Stay

## 2023-08-18 ENCOUNTER — Encounter: Payer: Self-pay | Admitting: Nurse Practitioner

## 2023-08-18 VITALS — BP 90/80 | HR 79 | Temp 97.6°F | Resp 16 | Ht 65.0 in | Wt 154.0 lb

## 2023-08-18 DIAGNOSIS — Z5111 Encounter for antineoplastic chemotherapy: Secondary | ICD-10-CM

## 2023-08-18 DIAGNOSIS — Z5112 Encounter for antineoplastic immunotherapy: Secondary | ICD-10-CM | POA: Diagnosis not present

## 2023-08-18 DIAGNOSIS — C786 Secondary malignant neoplasm of retroperitoneum and peritoneum: Secondary | ICD-10-CM

## 2023-08-18 LAB — SAMPLE TO BLOOD BANK

## 2023-08-18 LAB — CMP (CANCER CENTER ONLY)
ALT: 39 U/L (ref 0–44)
AST: 41 U/L (ref 15–41)
Albumin: 3.1 g/dL — ABNORMAL LOW (ref 3.5–5.0)
Alkaline Phosphatase: 150 U/L — ABNORMAL HIGH (ref 38–126)
Anion gap: 12 (ref 5–15)
BUN: 38 mg/dL — ABNORMAL HIGH (ref 8–23)
CO2: 23 mmol/L (ref 22–32)
Calcium: 8.7 mg/dL — ABNORMAL LOW (ref 8.9–10.3)
Chloride: 95 mmol/L — ABNORMAL LOW (ref 98–111)
Creatinine: 1.51 mg/dL — ABNORMAL HIGH (ref 0.61–1.24)
GFR, Estimated: 46 mL/min — ABNORMAL LOW (ref >60)
Glucose, Bld: 137 mg/dL — ABNORMAL HIGH (ref 70–99)
Potassium: 3.9 mmol/L (ref 3.5–5.1)
Sodium: 130 mmol/L — ABNORMAL LOW (ref 135–145)
Total Bilirubin: 0.8 mg/dL (ref 0.0–1.2)
Total Protein: 7.2 g/dL (ref 6.5–8.1)

## 2023-08-18 LAB — CBC WITH DIFFERENTIAL (CANCER CENTER ONLY)
Abs Immature Granulocytes: 0.07 K/uL (ref 0.00–0.07)
Basophils Absolute: 0 K/uL (ref 0.0–0.1)
Basophils Relative: 0 %
Eosinophils Absolute: 0 K/uL (ref 0.0–0.5)
Eosinophils Relative: 0 %
HCT: 25.7 % — ABNORMAL LOW (ref 39.0–52.0)
Hemoglobin: 8.4 g/dL — ABNORMAL LOW (ref 13.0–17.0)
Immature Granulocytes: 1 %
Lymphocytes Relative: 18 %
Lymphs Abs: 1.9 K/uL (ref 0.7–4.0)
MCH: 27 pg (ref 26.0–34.0)
MCHC: 32.7 g/dL (ref 30.0–36.0)
MCV: 82.6 fL (ref 80.0–100.0)
Monocytes Absolute: 1 K/uL (ref 0.1–1.0)
Monocytes Relative: 10 %
Neutro Abs: 7.1 K/uL (ref 1.7–7.7)
Neutrophils Relative %: 71 %
Platelet Count: 154 K/uL (ref 150–400)
RBC: 3.11 MIL/uL — ABNORMAL LOW (ref 4.22–5.81)
RDW: 14.6 % (ref 11.5–15.5)
WBC Count: 10.2 K/uL (ref 4.0–10.5)
nRBC: 0 % (ref 0.0–0.2)

## 2023-08-18 LAB — MAGNESIUM: Magnesium: 2.2 mg/dL (ref 1.7–2.4)

## 2023-08-18 MED ORDER — MAGNESIUM CITRATE PO SOLN: 1.0000 | Freq: Once | ORAL | 0 refills | Status: AC

## 2023-08-18 MED ORDER — POLYETHYLENE GLYCOL 3350 17 GM/SCOOP PO POWD: 17.0000 g | ORAL | 3 refills | Status: DC

## 2023-08-18 MED ORDER — SENNA 8.6 MG PO TABS: 2.0000 | ORAL_TABLET | Freq: Two times a day (BID) | ORAL | 3 refills | Status: DC | PRN

## 2023-08-18 MED FILL — Fosaprepitant Dimeglumine For IV Infusion 150 MG (Base Eq): INTRAVENOUS | Qty: 5 | Status: AC

## 2023-08-18 NOTE — Progress Notes (Signed)
 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 He currently feels well. He continues to have vague abdominal pain, but this is well-controlled with oxycodone . He has a fair appetite. He has no neurologic complaints. He denies any recent fevers or illnesses. He has no chest pain, shortness of breath, cough, or hemoptysis. He denies any nausea, vomiting, constipation, or diarrhea. He has no melena or hematochezia. He has no urinary complaints. Patient offers no further specific complaints today.  REVIEW OF SYSTEMS:   Review of Systems  Constitutional:  Negative for fever, malaise/fatigue and weight loss.  Respiratory: Negative.  Negative for cough, hemoptysis and shortness of breath.   Cardiovascular: Negative.  Negative for chest pain and leg swelling.  Gastrointestinal:  Positive for abdominal pain. Negative for blood in stool, constipation, diarrhea, melena, nausea and vomiting.  Genitourinary: Negative.  Negative for dysuria.  Musculoskeletal: Negative.  Negative for back pain.  Skin: Negative.  Negative for rash.  Neurological: Negative.  Negative for dizziness, focal weakness, weakness and headaches.  Psychiatric/Behavioral: Negative.  The patient is not nervous/anxious.   As per HPI. Otherwise, a  complete review of systems is negative.  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   CAD (coronary artery disease) 09/17/2021   a.) R/LHC 09/17/2021: 30% pRCA, 40% dRCA, 50% pLCx, 30% dLCx, 50% pLAD, 50% mLAD, 50% dLAD --> med mgmt   Cardiac murmur    Cervical spondylosis    Cholelithiasis    CRAO (central retinal artery occlusion), 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   CVA (cerebral vascular accident) (HCC) 03/13/2018   a.) MRI brain 03/14/2018 --> acute infarct in the posterior RIGHT frontal lobe; small chronic cerebellar infarct --> Tx'd with TPA with (+) improvement/resolution of symptoms   Diastolic dysfunction    a.) TTE 01/31/2015: EF 55-60%, MAC, G1DD; b.) TTE 03/14/2018: EF 60-65%, mild LAE, sev MAC, sev AoV cal with mod AS (MPG 14) G2DD; c.) TTE 09/13/2019: EF 55-60%, mild LAE, mild-mod MR, mild AS (MPG 12.5), G2DD; d.) TTE 08/16/2021: EF 60-65%, mild LVH, mod MAC, mild MR, mod AS (MPG 18.5), G1DD; e.) TTE 12/05/2021: EF 60-65%, mod MAC, s/p TAVR (well seated/functioning valve), mild MR, G1DD   GERD (gastroesophageal reflux disease)    History of chicken pox    History of measles    Hyperlipidemia    Hypertension    LBBB (left bundle branch block)    Long term current use of anticoagulant    a.) apixaban    Low back pain radiating to left lower extremity    PAF (paroxysmal atrial fibrillation) (HCC)  a.) CHA2DS2VASc = 6 (age x2, HTN, CVA x2, vascular disease history);  b.) rate/rhythm maintained on oral metoprolol  succinate; chronically anticoagulated with apixaban    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   a.) 26mm S3UR via RIGHT TF approach   Skin cancer    PAST  SURGICAL HISTORY: Past Surgical History:  Procedure Laterality Date   INTRAOPERATIVE TRANSTHORACIC ECHOCARDIOGRAM N/A 11/06/2021   Procedure: INTRAOPERATIVE TRANSTHORACIC ECHOCARDIOGRAM;  Surgeon: Verlin Lonni BIRCH, MD;  Location: [facility] OR;  Service: Open Heart Surgery;  Laterality: N/A;   IR IMAGING GUIDED PORT INSERTION  08/07/2023   RIGHT/LEFT HEART CATH AND CORONARY ANGIOGRAPHY N/A 09/17/2021   Procedure: RIGHT/LEFT HEART CATH AND CORONARY ANGIOGRAPHY;  Surgeon: Verlin Lonni BIRCH, MD;  Location: [facility] INVASIVE CV LAB;  Service: Cardiovascular;  Laterality: N/A;   ROBOTIC ASSISTED LAPAROSCOPIC CHOLECYSTECTOMY  07/24/2022   SKIN CANCER EXCISION  07/2020   TRANSCATHETER AORTIC VALVE REPLACEMENT, TRANSFEMORAL N/A 11/06/2021   Procedure: Transcatheter Aortic Valve Replacement, Transfemoral;  Surgeon: Verlin Lonni BIRCH, MD;  Location: [facility] OR;  Service: Open Heart Surgery;  Laterality: N/A;   FAMILY HISTORY: Family History  Problem Relation Age of Onset   Arrhythmia Mother        A-Fib   Lung cancer Father    Diabetes Son    Colon cancer Neg Hx    Prostate cancer Neg Hx    ADVANCED DIRECTIVES (Y/N):  N  HEALTH MAINTENANCE: Social History   Tobacco Use   Smoking status: Former    Current packs/day: 0.00    Average packs/day: 1 pack/day for 20.0 years (20.0 ttl pk-yrs)    Types: Cigarettes    Start date: 03/12/1963    Quit date: 03/12/1983    Years since quitting: 40.4    Passive exposure: Past   Smokeless tobacco: Never  Vaping Use   Vaping status: Never Used  Substance Use Topics   Alcohol use: No   Drug use: No    Colonoscopy:  PAP:  Bone density:  Lipid panel:  No Known Allergies  Current Outpatient Medications  Medication Sig Dispense Refill   amLODipine  (NORVASC ) 10 MG tablet Take 1 tablet (10 mg total) by mouth daily. 90 tablet 3   amoxicillin -clavulanate (AUGMENTIN ) 875-125 MG tablet Take 1 tablet by mouth 2 (two) times daily. 10 tablet 0   apixaban   (ELIQUIS ) 5 MG TABS tablet TAKE ONE (1) TABLET BY MOUTH TWO TIMES PER DAYTAKE ONE (1) TABLET BY MOUTH TWO TIMES PER DAY, may resume on Friday AM, June 28. 180 tablet 1   atorvastatin  (LIPITOR) 80 MG tablet TAKE ONE TABLET BY MOUTH ONCE DAILY 30 tablet 3   ibuprofen  (ADVIL ) 800 MG tablet Take 1 tablet (800 mg total) by mouth every 8 (eight) hours as needed. 30 tablet 0   metoprolol  succinate (TOPROL -XL) 25 MG 24 hr tablet TAKE ONE AND ONE-HALF TABLETS DAILY 45 tablet 3   ondansetron  (ZOFRAN ) 4 MG tablet Take 1 tablet (4 mg total) by mouth every 8 (eight) hours as needed. 15 tablet 0   ondansetron  (ZOFRAN ) 8 MG tablet Take 1 tablet (8 mg total) by mouth every 8 (eight) hours as needed for nausea or vomiting. Start on the third day after cisplatin . 60 tablet 2   oxyCODONE  (ROXICODONE ) 5 MG immediate release tablet Take 1 tablet (5 mg total) by mouth every 6 (six) hours as needed. 30 tablet 0   prochlorperazine  (COMPAZINE ) 10 MG tablet Take 1 tablet (10 mg total) by mouth every 6 (six) hours as  needed (Nausea or vomiting). 60 tablet 2   senna-docusate (SENOKOT-S) 8.6-50 MG tablet Take 1 tablet by mouth daily. 30 tablet 0   amoxicillin  (AMOXIL ) 500 MG tablet Take 4 tablets (2,000 mg total) by mouth as directed. 1 hour prior to dental work including cleanings (Patient not taking: Reported on 08/18/2023) 12 tablet 12   Cyanocobalamin (B-12) 3000 MCG CAPS Take 3,000 mcg by mouth daily as needed (energy). (Patient not taking: Reported on 08/18/2023)     lidocaine -prilocaine  (EMLA ) cream Apply to affected area once (Patient not taking: Reported on 08/18/2023) 30 g 3   No current facility-administered medications for this visit.   OBJECTIVE: Vitals:   08/18/23 1517  BP: 90/80  Pulse: 79  Resp: 16  Temp: 97.6 F (36.4 C)  SpO2: 98%     Body mass index is 25.63 kg/m.    ECOG FS:1 - Symptomatic but completely ambulatory  General: Well-developed, well-nourished, no acute distress. Eyes: Pink conjunctiva,  anicteric sclera. HEENT: Normocephalic, moist mucous membranes. Lungs: No audible wheezing or coughing. Heart: Regular rate and rhythm. Abdomen: Soft, nontender, no obvious distention. Musculoskeletal: No edema, cyanosis, or clubbing. Neuro: Alert, answering all questions appropriately. Cranial nerves grossly intact. Skin: No rashes or petechiae noted. Psych: Normal affect.  LAB RESULTS: Lab Results  Component Value Date   NA 130 (L) 08/18/2023   K 3.9 08/18/2023   CL 95 (L) 08/18/2023   CO2 23 08/18/2023   GLUCOSE 137 (H) 08/18/2023   BUN 38 (H) 08/18/2023   CREATININE 1.51 (H) 08/18/2023   CALCIUM  8.7 (L) 08/18/2023   PROT 7.2 08/18/2023   ALBUMIN 3.1 (L) 08/18/2023   AST 41 08/18/2023   ALT 39 08/18/2023   ALKPHOS 150 (H) 08/18/2023   BILITOT 0.8 08/18/2023   GFRNONAA 46 (L) 08/18/2023   GFRAA 81 08/25/2019   Lab Results  Component Value Date   WBC 10.2 08/18/2023   NEUTROABS 7.1 08/18/2023   HGB 8.4 (L) 08/18/2023   HCT 25.7 (L) 08/18/2023   MCV 82.6 08/18/2023   PLT 154 08/18/2023   STUDIES: IR IMAGING GUIDED PORT INSERTION Result Date: 08/07/2023 CLINICAL DATA:  Peritoneal carcinomatosis and need for porta cath to begin chemotherapy. EXAM: IMPLANTED PORT A CATH PLACEMENT WITH ULTRASOUND AND FLUOROSCOPIC GUIDANCE ANESTHESIA/SEDATION: Moderate (conscious) sedation was employed during this procedure. A total of Versed  2.0 mg and Fentanyl  during mcg was administered intravenously. Moderate Sedation Time: 42 minutes. The patient's level of consciousness and vital signs were monitored continuously by radiology nursing throughout the procedure under my direct supervision. Additional Medications: 2 g IV Ancef . FLUOROSCOPY: Radiation Exposure Index: 21.2 mGy Kerma PROCEDURE: The procedure, risks, benefits, and alternatives were explained to the patient. Questions regarding the procedure were encouraged and answered. The patient understands and consents to the procedure. A  time-out was performed prior to initiating the procedure. Ultrasound was utilized to confirm patency of the right internal jugular vein. An ultrasound image was saved and recorded. The right neck and chest were prepped with chlorhexidine  in a sterile fashion, and a sterile drape was applied covering the operative field. Maximum barrier sterile technique with sterile gowns and gloves were used for the procedure. Local anesthesia was provided with 1% lidocaine . After creating a small venotomy incision, a 21 gauge needle was advanced into the right internal jugular vein under direct, real-time ultrasound guidance. Ultrasound image documentation was performed. After securing guidewire access, an 8 Fr dilator was placed. A J-wire was kinked to measure appropriate catheter length. A subcutaneous port  pocket was then created along the upper chest wall utilizing sharp and blunt dissection. Portable cautery was utilized. The pocket was irrigated with sterile saline. A single lumen power injectable port was chosen for placement. The 8 Fr catheter was tunneled from the port pocket site to the venotomy incision. The port was placed in the pocket. External catheter was trimmed to appropriate length based on guidewire measurement. At the venotomy, an 8 Fr peel-away sheath was placed over a guidewire. The catheter was then placed through the sheath and the sheath removed. Final catheter positioning was confirmed and documented with a fluoroscopic spot image. The port was accessed with a needle and aspirated and flushed with heparinized saline. The access needle was removed. The venotomy and port pocket incisions were closed with subcutaneous 3-0 Monocryl and subcuticular 4-0 Vicryl. Dermabond was applied to both incisions. COMPLICATIONS: COMPLICATIONS None FINDINGS: After catheter placement, the tip lies at the cavo-atrial junction. The catheter aspirates normally and is ready for immediate use. IMPRESSION: Placement of single  lumen port a cath via right internal jugular vein. The catheter tip lies at the cavo-atrial junction. A power injectable port a cath was placed and is ready for immediate use. Electronically Signed   By: Marcey Moan M.D.   On: 08/07/2023 13:26   CT ABDOMINAL MASS BIOPSY Result Date: 07/24/2023 CLINICAL DATA:  Multiple liver metastases and diffuse peritoneal and omental tumor with no known primary carcinoma. The patient presents for tissue biopsy. EXAM: CT GUIDED CORE BIOPSY OF PERITONEAL MASS ANESTHESIA/SEDATION: Moderate (conscious) sedation was employed during this procedure. A total of Versed  1.0 mg and Fentanyl  100 mcg was administered intravenously. Moderate Sedation Time: 15 minutes. The patient's level of consciousness and vital signs were monitored continuously by radiology nursing throughout the procedure under my direct supervision. PROCEDURE: The procedure risks, benefits, and alternatives were explained to the patient. Questions regarding the procedure were encouraged and answered. The patient understands and consents to the procedure. A time out was performed prior to initiating the procedure. RADIATION DOSE REDUCTION: This exam was performed according to the departmental dose-optimization program which includes automated exposure control, adjustment of the mA and/or kV according to patient size and/or use of iterative reconstruction technique. The left abdominal wall was prepped with chlorhexidine  in a sterile fashion, and a sterile drape was applied covering the operative field. A sterile gown and sterile gloves were used for the procedure. Local anesthesia was provided with 1% Lidocaine . CT was performed in a supine position through the mid to lower abdomen. After localizing peritoneal tumor in the left lateral anterior peritoneal cavity, a 17 gauge trocar needle was advanced under CT guidance to the level of tumor mass. Three separate coaxial 18 gauge core biopsy samples were obtained and  submitted in formalin. A slurry of Gel-Foam EmboCubes was then injected via the outer needle prior to its retraction and removal. Additional CT was performed. COMPLICATIONS: None FINDINGS: Bulky anterior peritoneal tumor just deep to the left lateral abdominal wall was chosen for sampling with solid tissue obtained. IMPRESSION: CT-guided core biopsy performed of peritoneal tumor in the left anterior peritoneal cavity. Electronically Signed   By: Marcey Moan M.D.   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  PET scan on July 15, 2023 revealed greater than 30 liver lesions as well as carcinomatosis.  Subsequent biopsy a poorly differentiated carcinoma consistent with either upper GI or pancreatobiliary origin.  Tumor markers are negative. NGS and tissue enlargement testing is pending at time of dictation.  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 Plan to reimage after cycle 4. Labs reviewed and acceptable for treatment. Proceed with D8C1 tomorrow of cisplatin -gemcitabine . He will rtc on D3 for fulphila  support. He will follow up with Dr Jacobo for C2D1 in 2 weeks.  Symptomatic Anemia- s/p 1 unit pRBCs. Hmg 8.4 today. Ok to treat. Plan to recheck counts in 1 week. Plan for transfusion for hmg < 8. Recommend irradiated products given active malignancy and chemotherapy.  Constipation- unrelieved by senna. Magnesium  citrate for acute constipation then plan for mirlax 1-3 times a day and senna 2 tablets 1-2 times a day to achieve  soft BM.  Weakness- due to malignancy and poor intake. Appreciate dietary input. Follow up with palliative care for possible appetite stimulant.  Hypotension- has been holding BP medication. Continue to do so. Fluids with treatment and again later this week.  Abdominal pain: Continue oxycodone  as prescribed. Hyponatremia: Chronic and unchanged.  Patient sodium level was 133. Transaminitis: Nearly resolved. Alk phos improving. AST and ALT have normalized.  AKI- Creatinine elevated from baseline. Cr 1.51 today. Likely related to cisplatin . Fluids with cisplatin  as planned and again later this week. Monitor closely.  Anemia: Hemoglobin is trended down to 7.4.  1 unit of packed red blood cells as above.   Leukocytosis: Chronic and unchanged.  Likely reactive. Goals of care- treatment given with palliative approach. Plan to follow up with Josh later this week for palliative care/symptom management.   Disposition:  C1D8 cis-gem tomorrow Add fluids on D3 (Thursday) & see Josh for palliative care 1 week- lab (cbc, hold tube), see me D2- possible blood - la  Patient expressed understanding and was in agreement with this plan. He also understands that He can call clinic at any time with any questions, concerns, or complaints.    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

## 2023-08-18 NOTE — Progress Notes (Signed)
 Patient has had constipation for about 2 days now, he is on senna but it isn't working. He is swimmy headed, he has no appetite, His Blood Pressure has been low lately to were he isn't taking his BP medication. He is very weak and shortness of breath. He would like a handicap tag, and a walker.   I have the handicap form filled out waiting on Lauren Allen's approval and signature.

## 2023-08-18 NOTE — Telephone Encounter (Signed)
 Filled out and faxed over the request for this patient to get a shower chair and a standard walker.

## 2023-08-19 ENCOUNTER — Inpatient Hospital Stay

## 2023-08-19 ENCOUNTER — Telehealth: Payer: Self-pay

## 2023-08-19 VITALS — BP 116/72 | HR 100 | Temp 97.0°F | Resp 17

## 2023-08-19 DIAGNOSIS — C786 Secondary malignant neoplasm of retroperitoneum and peritoneum: Secondary | ICD-10-CM

## 2023-08-19 DIAGNOSIS — Z5112 Encounter for antineoplastic immunotherapy: Secondary | ICD-10-CM | POA: Diagnosis not present

## 2023-08-19 MED ORDER — DEXAMETHASONE SODIUM PHOSPHATE 10 MG/ML IJ SOLN
10.0000 mg | Freq: Once | INTRAMUSCULAR | Status: AC
  Administered 2023-08-19: 10 mg via INTRAVENOUS
  Filled 2023-08-19: qty 1

## 2023-08-19 MED ORDER — SODIUM CHLORIDE 0.9 % IV SOLN
1000.0000 mg/m2 | Freq: Once | INTRAVENOUS | Status: AC
  Administered 2023-08-19: 1862 mg via INTRAVENOUS
  Filled 2023-08-19: qty 48.97

## 2023-08-19 MED ORDER — PALONOSETRON HCL INJECTION 0.25 MG/5ML
0.2500 mg | Freq: Once | INTRAVENOUS | Status: AC
  Administered 2023-08-19: 0.25 mg via INTRAVENOUS
  Filled 2023-08-19: qty 5

## 2023-08-19 MED ORDER — MAGNESIUM SULFATE 2 GM/50ML IV SOLN
2.0000 g | Freq: Once | INTRAVENOUS | Status: AC
Start: 2023-08-19 — End: 2023-08-19
  Administered 2023-08-19: 2 g via INTRAVENOUS
  Filled 2023-08-19: qty 50

## 2023-08-19 MED ORDER — OXYCODONE HCL 5 MG PO TABS: 5.0000 mg | ORAL_TABLET | Freq: Four times a day (QID) | ORAL | 0 refills | Status: DC | PRN

## 2023-08-19 MED ORDER — HEPARIN SOD (PORK) LOCK FLUSH 100 UNIT/ML IV SOLN
500.0000 [IU] | Freq: Once | INTRAVENOUS | Status: AC | PRN
  Administered 2023-08-19: 500 [IU]
  Filled 2023-08-19: qty 5

## 2023-08-19 MED ORDER — SODIUM CHLORIDE 0.9% FLUSH
10.0000 mL | INTRAVENOUS | Status: DC | PRN
Start: 2023-08-19 — End: 2023-08-19
  Administered 2023-08-19: 10 mL
  Filled 2023-08-19: qty 10

## 2023-08-19 MED ORDER — POTASSIUM CHLORIDE IN NACL 20-0.9 MEQ/L-% IV SOLN
Freq: Once | INTRAVENOUS | Status: AC
  Filled 2023-08-19: qty 1000

## 2023-08-19 MED ORDER — SODIUM CHLORIDE 0.9 % IV SOLN
INTRAVENOUS | Status: DC
  Filled 2023-08-19: qty 250

## 2023-08-19 MED ORDER — SODIUM CHLORIDE 0.9 % IV SOLN
12.5000 mg/m2 | Freq: Once | INTRAVENOUS | Status: AC
  Administered 2023-08-19: 23 mg via INTRAVENOUS
  Filled 2023-08-19: qty 23

## 2023-08-19 MED ORDER — SODIUM CHLORIDE 0.9 % IV SOLN
150.0000 mg | Freq: Once | INTRAVENOUS | Status: AC
  Administered 2023-08-19: 150 mg via INTRAVENOUS
  Filled 2023-08-19: qty 5

## 2023-08-19 MED ORDER — SODIUM CHLORIDE 0.9 % IV SOLN: 25.0000 mg/m2 | Freq: Once | INTRAVENOUS | Status: DC

## 2023-08-19 NOTE — Progress Notes (Signed)
 Nutrition Assessment   Reason for Assessment:  Verbal referral received from nursing regarding poor appetite, weight loss   ASSESSMENT:  83 year old male with stage IV possible biliary tract primary (upper GI or pancreatobiliary.  Past medical history of CAD, CVA, GERD, HLD, HTN.  Patient receiving cisplatin , gemcitabine , durvalumab .  Met with patient in infusion.  Patient reports poor po intake for the last few weeks, since treatment started.  Reports no appetite.  I just nibble.  Said he ate 1 chicken strip from [employer] last night for supper.  No breakfast before coming to the cancer center today.  Said that Saturday he ate some steak.  Has had a ham sandwich and chips recently.  Also says that he tried Fairlife shake and boost shake but did not really like them.  I got them down real fast.  Has been having issues with constipation, says he had a bowel movement this am.  Denies taste alterations.  Patient lives at home with his wife. Daughter is a Radiation protection practitioner.   Medications: compazine , zofran , senokot, miralax , Vit B 12   Labs: Na 130, glucose 137, BUN 38, creatinine 1.51   Anthropometrics:   Height: 65 inches Weight: 154 lb 7/21 167 lb on 6/26 183 lb on 01/09/23 BMI: 25  8% weight loss in the last month, significant 16% weight loss in the last 7 months   Estimated Energy Needs  Kcals: 1750-2100 Protein: 84-105 g Fluid: 1750-2100 ml   NUTRITION DIAGNOSIS: Inadequate oral intake related to cancer and related treatment side effects as evidenced by 8% weight loss in the last month and eating less than 50% of estimated energy needs in > 5 days   INTERVENTION:  Encouraged small frequent nibbles q 2 hours.   Include protein foods (meats, peanut butter, eggs, beans, dairy foods) Encouraged drinking oral nutrition supplement and mixing with ice cream to enhance the taste Contact information provided   MONITORING, EVALUATION, GOAL: weight trends, intake   Next Visit:  Tuesday, August 5 during infusion  Cullan Launer B. Dasie SOLON, CSO, LDN Registered Dietitian [phone]

## 2023-08-19 NOTE — Progress Notes (Signed)
 [facility] CSW Progress Note  Visual merchandiser met with caregiver to follow-up on need for community resources.    Interventions: Provided patient with information about information CSW could provide for patient's cancer insurance policy.  They would like a letter from MD stating patient's diagnosis and treatment.  CSW to consult Dr. Jacobo upon his return.       Follow Up Plan:  CSW will follow-up with patient by phone     Macario CHRISTELLA Au, LCSW Clinical Social Worker [facility]

## 2023-08-19 NOTE — Progress Notes (Signed)
 Cisplatin  dose reduced 50% due to CrCl of 37 mL/min. Scr up tp 1.51 from baseline of ~ 1.0    Jon Mejia Andreas, PharmD, BCPS Clinical Pharmacist

## 2023-08-19 NOTE — Telephone Encounter (Signed)
 Spoke with Jon Mejia our nutritionist about [name]. He was in our office yesterday 08/18/2023 to see Jon Mejia our NP to get his treatment today, and during his visit with us  he told me that he hasn't had an appetite in a long while so I mentioned that he should probably see our nutritionist Jon Mejia, which he agreed to. I forgot to message Jon Mejia yesterday afternoon, so I asked her today and she told me that she might be able to see him in infusion today, and if not then she would definitely see him on Wednesday 08/20/2023 when he comes in for fluids. I went over to infusion and relayed this same message and he understood and agreed to this plan. I also told him that I put in the request in parachute for his standard walker and his shower chair.

## 2023-08-19 NOTE — Patient Instructions (Signed)
 [facility] - A DEPT OF [facility]  Discharge Instructions: Thank you for choosing [facility] to provide your oncology and hematology care.  If you have a lab appointment with the Cancer Center, please go directly to the Cancer Center and check in at the registration area.  Wear comfortable clothing and clothing appropriate for easy access to any Portacath or PICC line.   We strive to give you quality time with your provider. You may need to reschedule your appointment if you arrive late (15 or more minutes).  Arriving late affects you and other patients whose appointments are after yours.  Also, if you miss three or more appointments without notifying the office, you may be dismissed from the clinic at the provider's discretion.      For prescription refill requests, have your pharmacy contact our office and allow 72 hours for refills to be completed.    Today you received the following chemotherapy and/or immunotherapy agents: CISplatin  / gemcitabine    To help prevent nausea and vomiting after your treatment, we encourage you to take your nausea medication as directed.  BELOW ARE SYMPTOMS THAT SHOULD BE REPORTED IMMEDIATELY: *FEVER GREATER THAN 100.4 F (38 C) OR HIGHER *CHILLS OR SWEATING *NAUSEA AND VOMITING THAT IS NOT CONTROLLED WITH YOUR NAUSEA MEDICATION *UNUSUAL SHORTNESS OF BREATH *UNUSUAL BRUISING OR BLEEDING *URINARY PROBLEMS (pain or burning when urinating, or frequent urination) *BOWEL PROBLEMS (unusual diarrhea, constipation, pain near the anus) TENDERNESS IN MOUTH AND THROAT WITH OR WITHOUT PRESENCE OF ULCERS (sore throat, sores in mouth, or a toothache) UNUSUAL RASH, SWELLING OR PAIN  UNUSUAL VAGINAL DISCHARGE OR ITCHING   Items with * indicate a potential emergency and should be followed up as soon as possible or go to the Emergency Department if any problems should occur.  Please show the CHEMOTHERAPY ALERT CARD or  IMMUNOTHERAPY ALERT CARD at check-in to the Emergency Department and triage nurse.  Should you have questions after your visit or need to cancel or reschedule your appointment, please contact [facility] - A DEPT OF [facility]  [phone] and follow the prompts.  Office hours are 8:00 a.m. to 4:30 p.m. Monday - Friday. Please note that voicemails left after 4:00 p.m. may not be returned until the following business day.  We are closed weekends and major holidays. You have access to a nurse at all times for urgent questions. Please call the main number to the clinic [phone] and follow the prompts.  For any non-urgent questions, you may also contact your provider using MyChart. We now offer e-Visits for anyone 41 and older to request care online for non-urgent symptoms. For details visit [email or web address].   Also download the MyChart app! Go to the app store, search MyChart, open the app, select [facility], and log in with your MyChart username and password.

## 2023-08-19 NOTE — Addendum Note (Signed)
 Addended by: Kobe Jansma G on: 08/19/2023 02:05 PM   Modules accepted: Orders

## 2023-08-20 ENCOUNTER — Inpatient Hospital Stay

## 2023-08-20 ENCOUNTER — Other Ambulatory Visit: Payer: Self-pay | Admitting: Nurse Practitioner

## 2023-08-20 ENCOUNTER — Inpatient Hospital Stay (HOSPITAL_BASED_OUTPATIENT_CLINIC_OR_DEPARTMENT_OTHER): Admitting: Hospice and Palliative Medicine

## 2023-08-20 DIAGNOSIS — C221 Intrahepatic bile duct carcinoma: Secondary | ICD-10-CM | POA: Diagnosis not present

## 2023-08-20 DIAGNOSIS — Z515 Encounter for palliative care: Secondary | ICD-10-CM | POA: Diagnosis not present

## 2023-08-20 DIAGNOSIS — C787 Secondary malignant neoplasm of liver and intrahepatic bile duct: Secondary | ICD-10-CM

## 2023-08-20 DIAGNOSIS — Z5112 Encounter for antineoplastic immunotherapy: Secondary | ICD-10-CM | POA: Diagnosis not present

## 2023-08-20 MED ORDER — HEPARIN SOD (PORK) LOCK FLUSH 100 UNIT/ML IV SOLN
500.0000 [IU] | Freq: Once | INTRAVENOUS | Status: AC
  Administered 2023-08-20: 500 [IU] via INTRAVENOUS
  Filled 2023-08-20: qty 5

## 2023-08-20 MED ORDER — SODIUM CHLORIDE 0.9 % IV SOLN
Freq: Once | INTRAVENOUS | Status: AC
Start: 2023-08-20 — End: 2023-08-20
  Filled 2023-08-20: qty 250

## 2023-08-20 NOTE — Progress Notes (Signed)
 Palliative Medicine [name] at [facility] [phone] [phone]   Name: Jon Mejia Date: 08/20/2023 MRN: [id number]  DOB: [birth year]  Patient Care Team: Gasper Nancyann BRAVO, MD as PCP - General (Family Medicine) Perla Evalene PARAS, MD as PCP - Cardiology (Cardiology) Portia Fireman, OD as Consulting Physician (Optometry) Perla, Evalene PARAS, MD as Consulting Physician (Cardiology) Maurie Rayfield BIRCH, RN as Oncology Nurse Navigator Jacobo, Evalene PARAS, MD as Consulting Physician (Oncology)    REASON FOR CONSULTATION: Jon Mejia is a 83 y.o. 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  Patient is being treated as a metastatic cholangiocarcinoma.  Palliative care was consulted to address goals and manage ongoing symptoms.  SOCIAL HISTORY:     reports that he quit smoking about 40 years ago. His smoking use included cigarettes. He started smoking about 60 years ago. He has a 20 pack-year smoking history. He has been exposed to tobacco smoke. He has never used smokeless tobacco. He reports that he does not drink alcohol and does not use drugs.  Patient is married and lives at home with his wife.  He retired from [employer].  ADVANCE DIRECTIVES:    CODE STATUS:   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   CAD (coronary artery disease) 09/17/2021   a.) R/LHC 09/17/2021: 30% pRCA, 40% dRCA, 50% pLCx, 30% dLCx, 50% pLAD, 50% mLAD, 50% dLAD --> med mgmt   Cardiac murmur    Cervical spondylosis    Cholelithiasis    CRAO (central retinal  artery occlusion), 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   CVA (cerebral vascular accident) (HCC) 03/13/2018   a.) MRI brain 03/14/2018 --> acute infarct in the posterior RIGHT frontal lobe; small chronic cerebellar infarct --> Tx'd with TPA with (+) improvement/resolution of symptoms   Diastolic dysfunction    a.) TTE 01/31/2015: EF 55-60%, MAC, G1DD; b.) TTE 03/14/2018: EF 60-65%, mild LAE, sev MAC, sev AoV cal with mod AS (MPG 14) G2DD; c.) TTE 09/13/2019: EF 55-60%, mild LAE, mild-mod MR, mild AS (MPG 12.5), G2DD; d.) TTE 08/16/2021: EF 60-65%, mild LVH, mod MAC, mild MR, mod AS (MPG 18.5), G1DD; e.) TTE 12/05/2021: EF 60-65%, mod MAC, s/p TAVR (well seated/functioning valve), mild MR, G1DD   GERD (gastroesophageal reflux disease)    History of chicken pox    History of measles    Hyperlipidemia    Hypertension    LBBB (left bundle branch block)    Long term current use of anticoagulant    a.) apixaban    Low back pain radiating to left lower extremity    PAF (paroxysmal atrial fibrillation) (HCC)    a.) CHA2DS2VASc = 6 (age x2, HTN, CVA x2, vascular disease history);  b.) rate/rhythm maintained on oral metoprolol  succinate; chronically anticoagulated with apixaban    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   a.) 26mm S3UR via RIGHT TF approach   Skin cancer     PAST SURGICAL HISTORY:  Past Surgical History:  Procedure Laterality Date   INTRAOPERATIVE TRANSTHORACIC ECHOCARDIOGRAM N/A 11/06/2021   Procedure: INTRAOPERATIVE TRANSTHORACIC ECHOCARDIOGRAM;  Surgeon:  Verlin Lonni BIRCH, MD;  Location: [facility] OR;  Service: Open Heart Surgery;  Laterality: N/A;   IR IMAGING GUIDED PORT INSERTION  08/07/2023   RIGHT/LEFT HEART CATH AND CORONARY ANGIOGRAPHY N/A 09/17/2021   Procedure: RIGHT/LEFT HEART CATH AND CORONARY ANGIOGRAPHY;  Surgeon: Verlin Lonni BIRCH, MD;  Location: [facility] INVASIVE CV LAB;  Service: Cardiovascular;  Laterality: N/A;   ROBOTIC ASSISTED LAPAROSCOPIC CHOLECYSTECTOMY   07/24/2022   SKIN CANCER EXCISION  07/2020   TRANSCATHETER AORTIC VALVE REPLACEMENT, TRANSFEMORAL N/A 11/06/2021   Procedure: Transcatheter Aortic Valve Replacement, Transfemoral;  Surgeon: Verlin Lonni BIRCH, MD;  Location: [facility] OR;  Service: Open Heart Surgery;  Laterality: N/A;    HEMATOLOGY/ONCOLOGY HISTORY:  Oncology History  Peritoneal carcinomatosis (HCC)  07/30/2023 Initial Diagnosis   Peritoneal carcinomatosis (HCC)   08/12/2023 -  Chemotherapy   Patient is on Treatment Plan : BILIARY TRACT Cisplatin  + Gemcitabine  D1,8 + Durvalumab  (1500) D1 q21d / Durvalumab  (1500) q28d     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  MEDICATIONS:  Current Outpatient Medications  Medication Sig Dispense Refill   amLODipine  (NORVASC ) 10 MG tablet Take 1 tablet (10 mg total) by mouth daily. 90 tablet 3   amoxicillin  (AMOXIL ) 500 MG tablet Take 4 tablets (2,000 mg total) by mouth as directed. 1 hour prior to dental work including cleanings (Patient not taking: Reported on 08/18/2023) 12 tablet 12   amoxicillin -clavulanate (AUGMENTIN ) 875-125 MG tablet Take 1 tablet by mouth 2 (two) times daily. 10 tablet 0   apixaban  (ELIQUIS ) 5 MG TABS tablet TAKE ONE (1) TABLET BY MOUTH TWO TIMES PER DAYTAKE ONE (1) TABLET BY MOUTH TWO TIMES PER DAY, may resume on Friday AM, June 28. 180 tablet 1   atorvastatin  (LIPITOR) 80 MG tablet TAKE ONE TABLET BY MOUTH ONCE DAILY 30 tablet 3   Cyanocobalamin (B-12) 3000 MCG CAPS Take 3,000 mcg by mouth daily as needed (energy). (Patient not taking: Reported on 08/18/2023)     ibuprofen  (ADVIL ) 800 MG tablet Take 1 tablet (800 mg total) by mouth every 8  (eight) hours as needed. 30 tablet 0   lidocaine -prilocaine  (EMLA ) cream Apply to affected area once (Patient not taking: Reported on 08/18/2023) 30 g 3   metoprolol  succinate (TOPROL -XL) 25 MG 24 hr tablet TAKE ONE AND ONE-HALF TABLETS DAILY 45 tablet 3   ondansetron  (ZOFRAN ) 4 MG tablet Take 1 tablet (4 mg total) by mouth every 8 (eight) hours as needed. 15 tablet 0   ondansetron  (ZOFRAN ) 8 MG tablet Take 1 tablet (8 mg total) by mouth every 8 (eight) hours as needed for nausea or vomiting. Start on the third day after cisplatin . 60 tablet 2   oxyCODONE  (ROXICODONE ) 5 MG immediate release tablet Take 1 tablet (5 mg total) by mouth every 6 (six) hours as needed. 90 tablet 0   polyethylene glycol powder (GLYCOLAX /MIRALAX ) 17 GM/SCOOP powder Take 17 g by mouth as directed. 510 g 3   prochlorperazine  (COMPAZINE ) 10 MG tablet Take 1 tablet (10 mg total) by mouth every 6 (six) hours as needed (Nausea or vomiting). 60 tablet 2   senna (SENOKOT) 8.6 MG TABS tablet Take 2 tablets (17.2 mg total) by mouth 2 (  two) times daily as needed for mild constipation or moderate constipation. 120 tablet 3   senna-docusate (SENOKOT-S) 8.6-50 MG tablet Take 1 tablet by mouth daily. 30 tablet 0   No current facility-administered medications for this visit.   Facility-Administered Medications Ordered in Other Visits  Medication Dose Route Frequency Provider Last Rate Last Admin   0.9 %  sodium chloride  infusion   Intravenous Once Dasie Tinnie MATSU, NP 500 mL/hr at 08/20/23 1312 New Bag at 08/20/23 1312    VITAL SIGNS: There were no vitals taken for this visit. There were no vitals filed for this visit.  Estimated body mass index is 25.63 kg/m as calculated from the following:   Height as of 08/18/23: 5' 5 (1.651 m).   Weight as of 08/18/23: 154 lb (69.9 kg).  LABS: CBC:    Component Value Date/Time   WBC 10.2 08/18/2023 1442   WBC 15.6 (H) 07/25/2023 1053   HGB 8.4 (L) 08/18/2023 1442   HGB 11.5 (L) 07/07/2023  0858   HCT 25.7 (L) 08/18/2023 1442   HCT 34.6 (L) 07/07/2023 0858   PLT 154 08/18/2023 1442   PLT 332 07/07/2023 0858   MCV 82.6 08/18/2023 1442   MCV 87 07/07/2023 0858   NEUTROABS 7.1 08/18/2023 1442   LYMPHSABS 1.9 08/18/2023 1442   MONOABS 1.0 08/18/2023 1442   EOSABS 0.0 08/18/2023 1442   BASOSABS 0.0 08/18/2023 1442   Comprehensive Metabolic Panel:    Component Value Date/Time   NA 130 (L) 08/18/2023 1442   NA 136 07/07/2023 0858   K 3.9 08/18/2023 1442   CL 95 (L) 08/18/2023 1442   CO2 23 08/18/2023 1442   BUN 38 (H) 08/18/2023 1442   BUN 15 07/07/2023 0858   CREATININE 1.51 (H) 08/18/2023 1442   CREATININE 1.10 01/09/2017 0906   GLUCOSE 137 (H) 08/18/2023 1442   CALCIUM  8.7 (L) 08/18/2023 1442   AST 41 08/18/2023 1442   ALT 39 08/18/2023 1442   ALKPHOS 150 (H) 08/18/2023 1442   BILITOT 0.8 08/18/2023 1442   PROT 7.2 08/18/2023 1442   PROT 6.8 07/07/2023 0858   ALBUMIN 3.1 (L) 08/18/2023 1442   ALBUMIN 4.0 07/07/2023 0858    RADIOGRAPHIC STUDIES: IR IMAGING GUIDED PORT INSERTION Result Date: 08/07/2023 CLINICAL DATA:  Peritoneal carcinomatosis and need for porta cath to begin chemotherapy. EXAM: IMPLANTED PORT A CATH PLACEMENT WITH ULTRASOUND AND FLUOROSCOPIC GUIDANCE ANESTHESIA/SEDATION: Moderate (conscious) sedation was employed during this procedure. A total of Versed  2.0 mg and Fentanyl  during mcg was administered intravenously. Moderate Sedation Time: 42 minutes. The patient's level of consciousness and vital signs were monitored continuously by radiology nursing throughout the procedure under my direct supervision. Additional Medications: 2 g IV Ancef . FLUOROSCOPY: Radiation Exposure Index: 21.2 mGy Kerma PROCEDURE: The procedure, risks, benefits, and alternatives were explained to the patient. Questions regarding the procedure were encouraged and answered. The patient understands and consents to the procedure. A time-out was performed prior to initiating the  procedure. Ultrasound was utilized to confirm patency of the right internal jugular vein. An ultrasound image was saved and recorded. The right neck and chest were prepped with chlorhexidine  in a sterile fashion, and a sterile drape was applied covering the operative field. Maximum barrier sterile technique with sterile gowns and gloves were used for the procedure. Local anesthesia was provided with 1% lidocaine . After creating a small venotomy incision, a 21 gauge needle was advanced into the right internal jugular vein under direct, real-time ultrasound guidance. Ultrasound image documentation  was performed. After securing guidewire access, an 8 Fr dilator was placed. A J-wire was kinked to measure appropriate catheter length. A subcutaneous port pocket was then created along the upper chest wall utilizing sharp and blunt dissection. Portable cautery was utilized. The pocket was irrigated with sterile saline. A single lumen power injectable port was chosen for placement. The 8 Fr catheter was tunneled from the port pocket site to the venotomy incision. The port was placed in the pocket. External catheter was trimmed to appropriate length based on guidewire measurement. At the venotomy, an 8 Fr peel-away sheath was placed over a guidewire. The catheter was then placed through the sheath and the sheath removed. Final catheter positioning was confirmed and documented with a fluoroscopic spot image. The port was accessed with a needle and aspirated and flushed with heparinized saline. The access needle was removed. The venotomy and port pocket incisions were closed with subcutaneous 3-0 Monocryl and subcuticular 4-0 Vicryl. Dermabond was applied to both incisions. COMPLICATIONS: COMPLICATIONS None FINDINGS: After catheter placement, the tip lies at the cavo-atrial junction. The catheter aspirates normally and is ready for immediate use. IMPRESSION: Placement of single lumen port a cath via right internal jugular vein.  The catheter tip lies at the cavo-atrial junction. A power injectable port a cath was placed and is ready for immediate use. Electronically Signed   By: Marcey Moan M.D.   On: 08/07/2023 13:26   CT ABDOMINAL MASS BIOPSY Result Date: 07/24/2023 CLINICAL DATA:  Multiple liver metastases and diffuse peritoneal and omental tumor with no known primary carcinoma. The patient presents for tissue biopsy. EXAM: CT GUIDED CORE BIOPSY OF PERITONEAL MASS ANESTHESIA/SEDATION: Moderate (conscious) sedation was employed during this procedure. A total of Versed  1.0 mg and Fentanyl  100 mcg was administered intravenously. Moderate Sedation Time: 15 minutes. The patient's level of consciousness and vital signs were monitored continuously by radiology nursing throughout the procedure under my direct supervision. PROCEDURE: The procedure risks, benefits, and alternatives were explained to the patient. Questions regarding the procedure were encouraged and answered. The patient understands and consents to the procedure. A time out was performed prior to initiating the procedure. RADIATION DOSE REDUCTION: This exam was performed according to the departmental dose-optimization program which includes automated exposure control, adjustment of the mA and/or kV according to patient size and/or use of iterative reconstruction technique. The left abdominal wall was prepped with chlorhexidine  in a sterile fashion, and a sterile drape was applied covering the operative field. A sterile gown and sterile gloves were used for the procedure. Local anesthesia was provided with 1% Lidocaine . CT was performed in a supine position through the mid to lower abdomen. After localizing peritoneal tumor in the left lateral anterior peritoneal cavity, a 17 gauge trocar needle was advanced under CT guidance to the level of tumor mass. Three separate coaxial 18 gauge core biopsy samples were obtained and submitted in formalin. A slurry of Gel-Foam EmboCubes  was then injected via the outer needle prior to its retraction and removal. Additional CT was performed. COMPLICATIONS: None FINDINGS: Bulky anterior peritoneal tumor just deep to the left lateral abdominal wall was chosen for sampling with solid tissue obtained. IMPRESSION: CT-guided core biopsy performed of peritoneal tumor in the left anterior peritoneal cavity. Electronically Signed   By: Marcey Moan M.D.   On: 07/24/2023 09:53    PERFORMANCE STATUS (ECOG) : 2 - Symptomatic, <50% confined to bed  Review of Systems Unless otherwise noted, a complete review of systems is negative.  Physical Exam General: NAD Cardiovascular: regular rate and rhythm Pulmonary: clear ant fields Abdomen: soft, nontender, + bowel sounds GU: no suprapubic tenderness Extremities: no edema, no joint deformities Skin: no rashes Neurological: Weakness but otherwise nonfocal  IMPRESSION: Follow-up visit.  Patient seen into clinic.  Patient feels like he is slowly improving.  He reports that his appetite has improved this week.  His pain is stable, only occasionally requiring oxycodone .  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  Patient does endorse weakness but no falls.  DME has been ordered including a shower chair and a 4 pronged cane.  Patient has had recent constipation but says that he has had success with MiraLAX  and Senokot.  Patient verbalizes agreement with current scope of treatment.  Patient sent home with a MOST form to review with family.  PLAN: - Continue current scope of treatment - Continue oxycodone  as needed  - Daily bowel regimen - MOST Form reviewed - Follow-up telephone visit 1 month   Patient expressed understanding and was in agreement with this plan. He also understands that He can call the clinic at any time with any questions, concerns, or complaints.     Time Total: 15 minutes  Visit  consisted of counseling and education dealing with the complex and emotionally intense issues of symptom management and palliative care in the setting of serious and potentially life-threatening illness.Greater than 50%  of this time was spent counseling and coordinating care related to the above assessment and plan.  Signed by: Fonda Mower, PhD, NP-C

## 2023-08-21 ENCOUNTER — Inpatient Hospital Stay

## 2023-08-21 DIAGNOSIS — C786 Secondary malignant neoplasm of retroperitoneum and peritoneum: Secondary | ICD-10-CM

## 2023-08-21 DIAGNOSIS — Z5112 Encounter for antineoplastic immunotherapy: Secondary | ICD-10-CM | POA: Diagnosis not present

## 2023-08-21 MED ORDER — PEGFILGRASTIM-FPGK 6 MG/0.6ML ~~LOC~~ SOSY
6.0000 mg | PREFILLED_SYRINGE | Freq: Once | SUBCUTANEOUS | Status: AC
  Administered 2023-08-21: 6 mg via SUBCUTANEOUS
  Filled 2023-08-21: qty 0.6

## 2023-08-22 ENCOUNTER — Other Ambulatory Visit: Payer: Self-pay | Admitting: *Deleted

## 2023-08-22 ENCOUNTER — Telehealth: Payer: Self-pay | Admitting: *Deleted

## 2023-08-22 DIAGNOSIS — C221 Intrahepatic bile duct carcinoma: Secondary | ICD-10-CM

## 2023-08-22 NOTE — Telephone Encounter (Signed)
 The wife is calling saying that he is having a lot problem trying to sleep she wondered if he can get some medication to help with him sleep, they are coming on Monday 7/19.  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    I spoke to [facility] and he says to put in for a telephone at 1:30 today and using the telephone  # [phone].  Gave that information to the wife.

## 2023-08-22 NOTE — Telephone Encounter (Signed)
 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  When she called I looked back and it was my fault because I did not get the scheduler to plug it in on his section.  The wife says it is very nice that you actually said that unit was her fault most people does put it on the rug.  I told her I try to find everything I can do for anybody and I am really sorry that I did not get that appointment put in today.  She says that they are coming on Monday and so far he has been doing good so far today was sleeping.  And she also said if it got worse she can call the on-call doctor if necessary but she thinks it will be fine they will see us  on Monday

## 2023-08-22 NOTE — Telephone Encounter (Signed)
 error

## 2023-08-25 ENCOUNTER — Telehealth: Payer: Self-pay | Admitting: *Deleted

## 2023-08-25 ENCOUNTER — Inpatient Hospital Stay: Admitting: Nurse Practitioner

## 2023-08-25 ENCOUNTER — Encounter: Payer: Self-pay | Admitting: Nurse Practitioner

## 2023-08-25 ENCOUNTER — Other Ambulatory Visit: Payer: Self-pay | Admitting: *Deleted

## 2023-08-25 ENCOUNTER — Inpatient Hospital Stay

## 2023-08-25 ENCOUNTER — Encounter: Payer: Self-pay | Admitting: Oncology

## 2023-08-25 VITALS — BP 120/55 | HR 90 | Temp 97.8°F | Resp 18 | Ht 65.0 in | Wt 157.0 lb

## 2023-08-25 DIAGNOSIS — D649 Anemia, unspecified: Secondary | ICD-10-CM | POA: Diagnosis not present

## 2023-08-25 DIAGNOSIS — C787 Secondary malignant neoplasm of liver and intrahepatic bile duct: Secondary | ICD-10-CM

## 2023-08-25 DIAGNOSIS — C221 Intrahepatic bile duct carcinoma: Secondary | ICD-10-CM

## 2023-08-25 DIAGNOSIS — Z5112 Encounter for antineoplastic immunotherapy: Secondary | ICD-10-CM | POA: Diagnosis not present

## 2023-08-25 LAB — CBC WITH DIFFERENTIAL/PLATELET
Abs Immature Granulocytes: 0.28 K/uL — ABNORMAL HIGH (ref 0.00–0.07)
Basophils Absolute: 0.1 K/uL (ref 0.0–0.1)
Basophils Relative: 1 %
Eosinophils Absolute: 0.1 K/uL (ref 0.0–0.5)
Eosinophils Relative: 0 %
HCT: 20.3 % — ABNORMAL LOW (ref 39.0–52.0)
Hemoglobin: 6.4 g/dL — CL (ref 13.0–17.0)
Immature Granulocytes: 1 %
Lymphocytes Relative: 10 %
Lymphs Abs: 2.1 K/uL (ref 0.7–4.0)
MCH: 26.9 pg (ref 26.0–34.0)
MCHC: 31.5 g/dL (ref 30.0–36.0)
MCV: 85.3 fL (ref 80.0–100.0)
Monocytes Absolute: 1.9 K/uL — ABNORMAL HIGH (ref 0.1–1.0)
Monocytes Relative: 10 %
Neutro Abs: 15.3 K/uL — ABNORMAL HIGH (ref 1.7–7.7)
Neutrophils Relative %: 78 %
Platelets: 59 K/uL — ABNORMAL LOW (ref 150–400)
RBC: 2.38 MIL/uL — ABNORMAL LOW (ref 4.22–5.81)
RDW: 15.9 % — ABNORMAL HIGH (ref 11.5–15.5)
Smear Review: NORMAL
WBC: 19.7 K/uL — ABNORMAL HIGH (ref 4.0–10.5)
nRBC: 0.1 % (ref 0.0–0.2)

## 2023-08-25 LAB — PREPARE RBC (CROSSMATCH)

## 2023-08-25 MED ORDER — TRAZODONE HCL 50 MG PO TABS: 50.0000 mg | ORAL_TABLET | Freq: Every evening | ORAL | 0 refills | Status: DC | PRN

## 2023-08-25 NOTE — Progress Notes (Signed)
 Patient has been having trouble sleeping at night. He has also been having some diarrhea for the past couple of days.

## 2023-08-25 NOTE — Progress Notes (Signed)
 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 He feels weak and questions if he needs a blood transfusion. Complains of general malaise which is ongoing wince his diagnosis and starting treatment. No nausea or vomiting. Constipation resolved but now some diarrhea. Weight stable.   REVIEW OF SYSTEMS:   Review of Systems  Constitutional:  Positive for malaise/fatigue. Negative for fever and weight loss.  Respiratory: Negative.  Negative for cough, hemoptysis and shortness of breath.   Cardiovascular: Negative.  Negative for chest pain and leg swelling.  Gastrointestinal:  Positive for diarrhea. Negative for abdominal pain, blood in stool, constipation, melena, nausea and vomiting.  Genitourinary: Negative.  Negative for dysuria.  Musculoskeletal: Negative.  Negative for back pain.  Skin: Negative.  Negative for rash.  Neurological: Negative.  Negative for dizziness, focal weakness, weakness and headaches.  Psychiatric/Behavioral: Negative.  The patient is not nervous/anxious.   As per HPI. Otherwise, a complete review of systems is negative.  PAST MEDICAL HISTORY: Past Medical History:  Diagnosis Date   Angina pectoris (HCC)    Aortic atherosclerosis (HCC)    Aortic stenosis    a.) TTE 03/14/2018: mod AS (MPG 14); b.) TTE 09/13/2019:  mild AS (MPG 12.5); c.) TTE 08/16/2021: mod AS (MPG 18.5); d.) R/LHC 09/17/2021: mRA 3, mPA 19, mPCWP 11, LVEDP 15, CO 5.22, CI 2.75; e.) s/p TAVR 11/06/2021   CAD (coronary artery disease) 09/17/2021   a.) R/LHC 09/17/2021: 30% pRCA, 40% dRCA, 50% pLCx, 30% dLCx, 50% pLAD, 50% mLAD, 50% dLAD --> med mgmt   Cardiac murmur    Cervical spondylosis    Cholelithiasis    CRAO (central retinal artery occlusion), 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   CVA (cerebral vascular accident) (HCC) 03/13/2018   a.) MRI brain 03/14/2018 --> acute infarct in the posterior RIGHT frontal lobe; small chronic cerebellar infarct --> Tx'd with TPA with (+) improvement/resolution of symptoms   Diastolic dysfunction    a.) TTE 01/31/2015: EF 55-60%, MAC, G1DD; b.) TTE 03/14/2018: EF 60-65%, mild LAE, sev MAC, sev AoV cal with mod AS (MPG 14) G2DD; c.) TTE 09/13/2019: EF 55-60%, mild LAE, mild-mod MR, mild AS (MPG 12.5), G2DD; d.) TTE 08/16/2021: EF 60-65%, mild LVH, mod MAC, mild MR, mod AS (MPG 18.5), G1DD; e.) TTE 12/05/2021: EF 60-65%, mod MAC, s/p TAVR (well seated/functioning valve), mild MR, G1DD   GERD (gastroesophageal reflux disease)    History of chicken pox    History of measles    Hyperlipidemia    Hypertension    LBBB (left bundle branch block)    Long term current use of anticoagulant    a.) apixaban    Low back pain radiating to left lower extremity    PAF (paroxysmal atrial fibrillation) (HCC)    a.) CHA2DS2VASc = 6 (age x2, HTN, CVA x2, vascular disease history);  b.) rate/rhythm maintained on oral metoprolol  succinate; chronically anticoagulated with apixaban    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  a.) 26mm S3UR via RIGHT TF approach   Skin cancer    PAST SURGICAL HISTORY: Past Surgical History:  Procedure Laterality Date   INTRAOPERATIVE TRANSTHORACIC ECHOCARDIOGRAM N/A 11/06/2021   Procedure: INTRAOPERATIVE TRANSTHORACIC ECHOCARDIOGRAM;  Surgeon: Verlin Lonni BIRCH, MD;  Location: [facility]  OR;  Service: Open Heart Surgery;  Laterality: N/A;   IR IMAGING GUIDED PORT INSERTION  08/07/2023   RIGHT/LEFT HEART CATH AND CORONARY ANGIOGRAPHY N/A 09/17/2021   Procedure: RIGHT/LEFT HEART CATH AND CORONARY ANGIOGRAPHY;  Surgeon: Verlin Lonni BIRCH, MD;  Location: [facility] INVASIVE CV LAB;  Service: Cardiovascular;  Laterality: N/A;   ROBOTIC ASSISTED LAPAROSCOPIC CHOLECYSTECTOMY  07/24/2022   SKIN CANCER EXCISION  07/2020   TRANSCATHETER AORTIC VALVE REPLACEMENT, TRANSFEMORAL N/A 11/06/2021   Procedure: Transcatheter Aortic Valve Replacement, Transfemoral;  Surgeon: Verlin Lonni BIRCH, MD;  Location: [facility] OR;  Service: Open Heart Surgery;  Laterality: N/A;   FAMILY HISTORY: Family History  Problem Relation Age of Onset   Arrhythmia Mother        A-Fib   Lung cancer Father    Diabetes Son    Colon cancer Neg Hx    Prostate cancer Neg Hx    ADVANCED DIRECTIVES (Y/N):  N  HEALTH MAINTENANCE: Social History   Tobacco Use   Smoking status: Former    Current packs/day: 0.00    Average packs/day: 1 pack/day for 20.0 years (20.0 ttl pk-yrs)    Types: Cigarettes    Start date: 03/12/1963    Quit date: 03/12/1983    Years since quitting: 40.4    Passive exposure: Past   Smokeless tobacco: Never  Vaping Use   Vaping status: Never Used  Substance Use Topics   Alcohol use: No   Drug use: No    Colonoscopy:  PAP:  Bone density:  Lipid panel:  No Known Allergies  Current Outpatient Medications  Medication Sig Dispense Refill   amLODipine  (NORVASC ) 10 MG tablet Take 1 tablet (10 mg total) by mouth daily. 90 tablet 3   apixaban  (ELIQUIS ) 5 MG TABS tablet TAKE ONE (1) TABLET BY MOUTH TWO TIMES PER DAYTAKE ONE (1) TABLET BY MOUTH TWO TIMES PER DAY, may resume on Friday AM, June 28. 180 tablet 1   atorvastatin  (LIPITOR) 80 MG tablet TAKE ONE TABLET BY MOUTH ONCE DAILY 30 tablet 3   ibuprofen  (ADVIL ) 800 MG tablet Take 1 tablet (800 mg total) by mouth every 8 (eight) hours as  needed. 30 tablet 0   metoprolol  succinate (TOPROL -XL) 25 MG 24 hr tablet TAKE ONE AND ONE-HALF TABLETS DAILY 45 tablet 3   ondansetron  (ZOFRAN ) 4 MG tablet Take 1 tablet (4 mg total) by mouth every 8 (eight) hours as needed. 15 tablet 0   ondansetron  (ZOFRAN ) 8 MG tablet Take 1 tablet (8 mg total) by mouth every 8 (eight) hours as needed for nausea or vomiting. Start on the third day after cisplatin . 60 tablet 2   oxyCODONE  (ROXICODONE ) 5 MG immediate release tablet Take 1 tablet (5 mg total) by mouth every 6 (six) hours as needed. 90 tablet 0   polyethylene glycol powder (GLYCOLAX /MIRALAX ) 17 GM/SCOOP powder Take 17 g by mouth as directed. 510 g 3   prochlorperazine  (COMPAZINE ) 10 MG tablet Take 1 tablet (10 mg total) by mouth every 6 (six) hours as needed (Nausea or vomiting). 60 tablet 2   amoxicillin  (AMOXIL ) 500 MG tablet Take 4 tablets (2,000 mg total) by mouth as directed. 1 hour prior to dental work including cleanings (Patient not taking:  Reported on 08/25/2023) 12 tablet 12   amoxicillin -clavulanate (AUGMENTIN ) 875-125 MG tablet Take 1 tablet by mouth 2 (two) times daily. (Patient not taking: Reported on 08/25/2023) 10 tablet 0   Cyanocobalamin (B-12) 3000 MCG CAPS Take 3,000 mcg by mouth daily as needed (energy). (Patient not taking: Reported on 08/25/2023)     lidocaine -prilocaine  (EMLA ) cream Apply to affected area once (Patient not taking: Reported on 08/25/2023) 30 g 3   senna (SENOKOT) 8.6 MG TABS tablet Take 2 tablets (17.2 mg total) by mouth 2 (two) times daily as needed for mild constipation or moderate constipation. (Patient not taking: Reported on 08/25/2023) 120 tablet 3   senna-docusate (SENOKOT-S) 8.6-50 MG tablet Take 1 tablet by mouth daily. (Patient not taking: Reported on 08/25/2023) 30 tablet 0   No current facility-administered medications for this visit.   OBJECTIVE: Vitals:   08/25/23 1340  BP: 118/65  Pulse: 90  Resp: 18  Temp: 97.8 F (36.6 C)  SpO2: 100%      Body mass index is 26.13 kg/m.    ECOG FS:1 - Symptomatic but completely ambulatory  General: Well-developed, well-nourished, no acute distress. Eyes: Pink conjunctiva, anicteric sclera. HEENT: Normocephalic, moist mucous membranes. Lungs: No audible wheezing or coughing. Heart: Regular rate and rhythm. Abdomen: Less rounded and softer appearing Musculoskeletal: No edema, cyanosis, or clubbing. Neuro: Alert, answering all questions appropriately. Cranial nerves grossly intact. Skin: No rashes or petechiae noted.  Psych: Normal affect.  LAB RESULTS: Lab Results  Component Value Date   NA 130 (L) 08/18/2023   K 3.9 08/18/2023   CL 95 (L) 08/18/2023   CO2 23 08/18/2023   GLUCOSE 137 (H) 08/18/2023   BUN 38 (H) 08/18/2023   CREATININE 1.51 (H) 08/18/2023   CALCIUM  8.7 (L) 08/18/2023   PROT 7.2 08/18/2023   ALBUMIN 3.1 (L) 08/18/2023   AST 41 08/18/2023   ALT 39 08/18/2023   ALKPHOS 150 (H) 08/18/2023   BILITOT 0.8 08/18/2023   GFRNONAA 46 (L) 08/18/2023   GFRAA 81 08/25/2019   Lab Results  Component Value Date   WBC 19.7 (H) 08/25/2023   NEUTROABS 15.3 (H) 08/25/2023   HGB 6.4 (LL) 08/25/2023   HCT 20.3 (L) 08/25/2023   MCV 85.3 08/25/2023   PLT 59 (L) 08/25/2023   STUDIES: IR IMAGING GUIDED PORT INSERTION Result Date: 08/07/2023 CLINICAL DATA:  Peritoneal carcinomatosis and need for porta cath to begin chemotherapy. EXAM: IMPLANTED PORT A CATH PLACEMENT WITH ULTRASOUND AND FLUOROSCOPIC GUIDANCE ANESTHESIA/SEDATION: Moderate (conscious) sedation was employed during this procedure. A total of Versed  2.0 mg and Fentanyl  during mcg was administered intravenously. Moderate Sedation Time: 42 minutes. The patient's level of consciousness and vital signs were monitored continuously by radiology nursing throughout the procedure under my direct supervision. Additional Medications: 2 g IV Ancef . FLUOROSCOPY: Radiation Exposure Index: 21.2 mGy Kerma PROCEDURE: The procedure, risks,  benefits, and alternatives were explained to the patient. Questions regarding the procedure were encouraged and answered. The patient understands and consents to the procedure. A time-out was performed prior to initiating the procedure. Ultrasound was utilized to confirm patency of the right internal jugular vein. An ultrasound image was saved and recorded. The right neck and chest were prepped with chlorhexidine  in a sterile fashion, and a sterile drape was applied covering the operative field. Maximum barrier sterile technique with sterile gowns and gloves were used for the procedure. Local anesthesia was provided with 1% lidocaine . After creating a small venotomy incision, a 21 gauge needle was advanced into  the right internal jugular vein under direct, real-time ultrasound guidance. Ultrasound image documentation was performed. After securing guidewire access, an 8 Fr dilator was placed. A J-wire was kinked to measure appropriate catheter length. A subcutaneous port pocket was then created along the upper chest wall utilizing sharp and blunt dissection. Portable cautery was utilized. The pocket was irrigated with sterile saline. A single lumen power injectable port was chosen for placement. The 8 Fr catheter was tunneled from the port pocket site to the venotomy incision. The port was placed in the pocket. External catheter was trimmed to appropriate length based on guidewire measurement. At the venotomy, an 8 Fr peel-away sheath was placed over a guidewire. The catheter was then placed through the sheath and the sheath removed. Final catheter positioning was confirmed and documented with a fluoroscopic spot image. The port was accessed with a needle and aspirated and flushed with heparinized saline. The access needle was removed. The venotomy and port pocket incisions were closed with subcutaneous 3-0 Monocryl and subcuticular 4-0 Vicryl. Dermabond was applied to both incisions. COMPLICATIONS: COMPLICATIONS  None FINDINGS: After catheter placement, the tip lies at the cavo-atrial junction. The catheter aspirates normally and is ready for immediate use. IMPRESSION: Placement of single lumen port a cath via right internal jugular vein. The catheter tip lies at the cavo-atrial junction. A power injectable port a cath was placed and is ready for immediate use. Electronically Signed   By: Marcey Moan M.D.   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  PET scan on July 15, 2023 revealed greater than 30 liver lesions as well as carcinomatosis.  Subsequent biopsy a poorly differentiated carcinoma consistent with either upper GI or pancreatobiliary origin.  Tumor markers are negative. NGS and tissue enlargement testing is pending at time of dictation.  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 Plan to reimage after cycle 4. Labs reviewed and acceptable for treatment. Patient's SCr up to 1.51 from baseline ~ 1.0 making today's CrCl 37 mL/min, recommended cisplatin  dose adjustment for CrCl < 45 mL/min is 50% reduction He is s/p D8C1 on 08/19/23. Received D3 fulphila  on 08/21/23. He will follow up with Dr Jacobo for C2D1 next week. If serum creatinine improved with interval fluids, we may be able to increase cisplatin  back to regular dosing with next cycle.  Symptomatic Anemia- s/p 1 unit pRBCs. Hmg today 6.4. Plan for 1 unit blood tomorrow. Recommend irradiated products tomorrow given active malignancy and chemotherapy. Plan for transfusion for  hmg < 8.  Insomnia- trial trazodone  50 mg at bedtime for sleep.  Constipation- unrelieved by senna. Magnesium  citrate for acute constipation then plan for mirlax 1-3 times a day and senna 2 tablets 1-2 times a day to achieve soft BM. Now resolved.  Weakness- due to malignancy and poor intake. Appreciate dietary input. Follow up with palliative care for possible appetite stimulant.  Hypotension- has been holding BP medication. Continue to do so. Fluids with treatment and again later this week.  Abdominal pain: Continue oxycodone  as prescribed. Hyponatremia: Chronic and unchanged.  Patient sodium level was  133. Transaminitis: Nearly resolved. Alk phos improving. AST and ALT have normalized.  AKI- Creatinine elevated from baseline. Cr 1.51 last week. Dose reduced by 50% cisplatin  for tomorrow. Fluids with cisplatin  as planned and again late last week. Monitor closely.  Leukocytosis: Chronic and unchanged.  Likely reactive. Platelets- 59. Due to chemo. Monitor.  Goals of care- treatment given with palliative approach. Established with palliative care.   Disposition:  1 unit of blood tomorrow Follow up as scheduled- la  Patient expressed understanding and was in agreement with this plan. He also understands that He can call clinic at any time with any questions, concerns, or complaints.    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

## 2023-08-25 NOTE — Telephone Encounter (Signed)
 Called patient to inform him to restart his Metoprolol  per request from Tinnie Dawn, NP.  Patient aware.

## 2023-08-26 ENCOUNTER — Telehealth: Payer: Self-pay | Admitting: *Deleted

## 2023-08-26 ENCOUNTER — Encounter: Payer: Self-pay | Admitting: Oncology

## 2023-08-26 ENCOUNTER — Inpatient Hospital Stay

## 2023-08-26 DIAGNOSIS — C221 Intrahepatic bile duct carcinoma: Secondary | ICD-10-CM

## 2023-08-26 DIAGNOSIS — C786 Secondary malignant neoplasm of retroperitoneum and peritoneum: Secondary | ICD-10-CM

## 2023-08-26 DIAGNOSIS — Z5112 Encounter for antineoplastic immunotherapy: Secondary | ICD-10-CM | POA: Diagnosis not present

## 2023-08-26 DIAGNOSIS — D649 Anemia, unspecified: Secondary | ICD-10-CM

## 2023-08-26 MED ORDER — HEPARIN SOD (PORK) LOCK FLUSH 100 UNIT/ML IV SOLN
500.0000 [IU] | Freq: Every day | INTRAVENOUS | Status: AC | PRN
  Administered 2023-08-26: 500 [IU]
  Filled 2023-08-26: qty 5

## 2023-08-26 MED ORDER — SODIUM CHLORIDE 0.9% FLUSH
10.0000 mL | INTRAVENOUS | Status: DC | PRN
  Filled 2023-08-26: qty 10

## 2023-08-26 MED ORDER — SODIUM CHLORIDE 0.9% FLUSH
3.0000 mL | INTRAVENOUS | Status: DC | PRN
  Filled 2023-08-26: qty 3

## 2023-08-26 MED ORDER — HEPARIN SOD (PORK) LOCK FLUSH 100 UNIT/ML IV SOLN
250.0000 [IU] | INTRAVENOUS | Status: DC | PRN
  Filled 2023-08-26: qty 5

## 2023-08-26 MED ORDER — SODIUM CHLORIDE 0.9% IV SOLUTION
250.0000 mL | INTRAVENOUS | Status: DC
  Administered 2023-08-26: 100 mL via INTRAVENOUS
  Filled 2023-08-26: qty 250

## 2023-08-26 NOTE — Patient Instructions (Signed)

## 2023-08-26 NOTE — Telephone Encounter (Signed)
 The wife says he woke up with heavy diarrhea and his stool is a little darker than usual.  Wants to know if he can still come over or not.she gave him imodium  and it goes an comes and he has diapers  on just in case. Wife wants to know if it is ok with darker stool today. For right now it is better.  Lauren talk to them yesterday and she is the one that told him to get him some Imodium and so she wanted to make sure if a darker color means something and he is going on and off with the diarrhea and wanted to make sure if it still okay to come over at 1230 to get his transfusion of blood.

## 2023-08-26 NOTE — Telephone Encounter (Signed)
 Spoke to [employer] about the stool being a little darker than had been and asked about is it okay for him to get his transfusion at 12:30 and Jerilynn says yes, and she also said that do another Imodium before he gets here.  The wife has already given to today.  The member will be taking him to the cancer center.  She has several times of appointments so that we can be checking on his blood work.  Wife understands

## 2023-08-27 LAB — TYPE AND SCREEN
ABO/RH(D): O POS
Antibody Screen: NEGATIVE
Unit division: 0

## 2023-08-27 LAB — BPAM RBC
Blood Product Expiration Date: 202508212359
ISSUE DATE / TIME: 202507291237
Unit Type and Rh: 202508212359
Unit Type and Rh: 5100

## 2023-08-29 ENCOUNTER — Telehealth: Payer: Self-pay

## 2023-08-29 NOTE — Progress Notes (Signed)
   08/29/2023  Patient ID: Debby [name], male   DOB: 1941-01-13, 83 y.o.   MRN: [id number]  This patient is appearing on a report for being at risk of failing the adherence measure for identified medications this calendar year.   Medication Adherence Summary (STAR/HEDIS Monitoring): Adherence Category: cholesterol (statin)    Drug Name: Atorvastatin  80 mg  Last Fill or Sold Date:08/12/2023 Days' Supply: 30   Notes: ? Adherence data NOT pulled from pharmacy claims portal Dr. Annemarie. I called total and was given the last filled date. I was told there are other medications that were fill for 90 days supply.  ? Reviewed barriers to adherence: days supply . ? Plan: Will contact provider about day supply conversion.  Dorcas Solian, PharmD Clinical Pharmacist Cell: [phone]

## 2023-09-01 ENCOUNTER — Encounter: Payer: Self-pay | Admitting: Oncology

## 2023-09-01 ENCOUNTER — Inpatient Hospital Stay (HOSPITAL_BASED_OUTPATIENT_CLINIC_OR_DEPARTMENT_OTHER): Admitting: Oncology

## 2023-09-01 ENCOUNTER — Other Ambulatory Visit: Payer: Self-pay | Admitting: *Deleted

## 2023-09-01 ENCOUNTER — Inpatient Hospital Stay: Attending: Oncology

## 2023-09-01 VITALS — BP 120/72 | HR 82 | Temp 97.6°F | Resp 18 | Ht 65.0 in | Wt 158.0 lb

## 2023-09-01 DIAGNOSIS — C801 Malignant (primary) neoplasm, unspecified: Secondary | ICD-10-CM | POA: Diagnosis not present

## 2023-09-01 DIAGNOSIS — D696 Thrombocytopenia, unspecified: Secondary | ICD-10-CM | POA: Diagnosis not present

## 2023-09-01 DIAGNOSIS — C787 Secondary malignant neoplasm of liver and intrahepatic bile duct: Secondary | ICD-10-CM | POA: Insufficient documentation

## 2023-09-01 DIAGNOSIS — I480 Paroxysmal atrial fibrillation: Secondary | ICD-10-CM | POA: Diagnosis not present

## 2023-09-01 DIAGNOSIS — Z79899 Other long term (current) drug therapy: Secondary | ICD-10-CM | POA: Insufficient documentation

## 2023-09-01 DIAGNOSIS — G893 Neoplasm related pain (acute) (chronic): Secondary | ICD-10-CM | POA: Insufficient documentation

## 2023-09-01 DIAGNOSIS — C786 Secondary malignant neoplasm of retroperitoneum and peritoneum: Secondary | ICD-10-CM | POA: Insufficient documentation

## 2023-09-01 DIAGNOSIS — Z5111 Encounter for antineoplastic chemotherapy: Secondary | ICD-10-CM | POA: Insufficient documentation

## 2023-09-01 DIAGNOSIS — D649 Anemia, unspecified: Secondary | ICD-10-CM | POA: Insufficient documentation

## 2023-09-01 DIAGNOSIS — Z5112 Encounter for antineoplastic immunotherapy: Secondary | ICD-10-CM | POA: Insufficient documentation

## 2023-09-01 DIAGNOSIS — Z7901 Long term (current) use of anticoagulants: Secondary | ICD-10-CM | POA: Insufficient documentation

## 2023-09-01 DIAGNOSIS — Z87891 Personal history of nicotine dependence: Secondary | ICD-10-CM | POA: Diagnosis not present

## 2023-09-01 LAB — CBC WITH DIFFERENTIAL (CANCER CENTER ONLY)
Abs Immature Granulocytes: 0.82 K/uL — ABNORMAL HIGH (ref 0.00–0.07)
Basophils Absolute: 0.1 K/uL (ref 0.0–0.1)
Basophils Relative: 0 %
Eosinophils Absolute: 0 K/uL (ref 0.0–0.5)
Eosinophils Relative: 0 %
HCT: 17.5 % — ABNORMAL LOW (ref 39.0–52.0)
Hemoglobin: 5.5 g/dL — CL (ref 13.0–17.0)
Immature Granulocytes: 5 %
Lymphocytes Relative: 7 %
Lymphs Abs: 1.3 K/uL (ref 0.7–4.0)
MCH: 27.4 pg (ref 26.0–34.0)
MCHC: 31.4 g/dL (ref 30.0–36.0)
MCV: 87.1 fL (ref 80.0–100.0)
Monocytes Absolute: 1.2 K/uL — ABNORMAL HIGH (ref 0.1–1.0)
Monocytes Relative: 6 %
Neutro Abs: 14.8 K/uL — ABNORMAL HIGH (ref 1.7–7.7)
Neutrophils Relative %: 82 %
Platelet Count: 190 K/uL (ref 150–400)
RBC: 2.01 MIL/uL — ABNORMAL LOW (ref 4.22–5.81)
RDW: 18.7 % — ABNORMAL HIGH (ref 11.5–15.5)
WBC Count: 18.2 K/uL — ABNORMAL HIGH (ref 4.0–10.5)
nRBC: 0 % (ref 0.0–0.2)

## 2023-09-01 LAB — CMP (CANCER CENTER ONLY)
ALT: 23 U/L (ref 0–44)
AST: 41 U/L (ref 15–41)
Albumin: 2.7 g/dL — ABNORMAL LOW (ref 3.5–5.0)
Alkaline Phosphatase: 223 U/L — ABNORMAL HIGH (ref 38–126)
Anion gap: 6 (ref 5–15)
BUN: 27 mg/dL — ABNORMAL HIGH (ref 8–23)
CO2: 24 mmol/L (ref 22–32)
Calcium: 8 mg/dL — ABNORMAL LOW (ref 8.9–10.3)
Chloride: 105 mmol/L (ref 98–111)
Creatinine: 1.45 mg/dL — ABNORMAL HIGH (ref 0.61–1.24)
GFR, Estimated: 48 mL/min — ABNORMAL LOW (ref >60)
Glucose, Bld: 149 mg/dL — ABNORMAL HIGH (ref 70–99)
Potassium: 3.8 mmol/L (ref 3.5–5.1)
Sodium: 135 mmol/L (ref 135–145)
Total Bilirubin: 0.5 mg/dL (ref 0.0–1.2)
Total Protein: 5.6 g/dL — ABNORMAL LOW (ref 6.5–8.1)

## 2023-09-01 LAB — SAMPLE TO BLOOD BANK

## 2023-09-01 LAB — PREPARE RBC (CROSSMATCH)

## 2023-09-01 LAB — MAGNESIUM: Magnesium: 1.7 mg/dL (ref 1.7–2.4)

## 2023-09-01 NOTE — Progress Notes (Signed)
 After patient received the blood transfusion last week, that day after he noticed some blood in the toilet. He is eating a little better wife says.  Seems to look like pale today.

## 2023-09-01 NOTE — Progress Notes (Signed)
 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    INTERVAL HISTORY: Patient returns to clinic today for further evaluation and consideration of cycle 2, day 1 of cisplatin , gemcitabine , and durvalumab .  He tolerated his first cycle of treatment well without significant side effects.  He has increased fatigue, but otherwise feels well.  He does not complain of abdominal pain today.  He has a fair appetite.  He has no neurologic complaints.  He denies any recent fevers or illnesses.  He has no chest pain, shortness of breath, cough, or hemoptysis.  He denies any nausea, vomiting, constipation, or diarrhea.  He has no melena, but did have 1 episode of hematochezia recently that resolved without intervention.  He has no urinary complaints.  Patient offers no further specific complaints today.   REVIEW OF SYSTEMS:   Review of Systems  Constitutional:  Positive for malaise/fatigue. Negative for fever and weight loss.  Respiratory: Negative.  Negative for cough, hemoptysis and shortness of breath.   Cardiovascular: Negative.  Negative for chest pain and leg swelling.  Gastrointestinal: Negative.  Negative for abdominal pain, blood in stool, constipation, diarrhea, melena, nausea and vomiting.  Genitourinary: Negative.  Negative for dysuria.  Musculoskeletal: Negative.  Negative for back pain.  Skin: Negative.  Negative for rash.  Neurological: Negative.   Negative for dizziness, focal weakness, weakness and headaches.  Psychiatric/Behavioral: Negative.  The patient is not nervous/anxious.     As per HPI. Otherwise, a complete review of systems is negative.  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   CAD (coronary artery disease) 09/17/2021   a.) R/LHC 09/17/2021: 30% pRCA, 40% dRCA, 50% pLCx, 30% dLCx, 50% pLAD, 50% mLAD, 50% dLAD --> med mgmt   Cardiac murmur    Cervical spondylosis    Cholelithiasis    CRAO (central retinal artery occlusion), 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   CVA (cerebral vascular accident) (HCC) 03/13/2018   a.) MRI brain 03/14/2018 --> acute infarct in the posterior RIGHT frontal lobe; small chronic cerebellar infarct --> Tx'd with TPA with (+) improvement/resolution of symptoms   Diastolic dysfunction    a.) TTE 01/31/2015: EF 55-60%, MAC, G1DD; b.) TTE 03/14/2018: EF 60-65%, mild LAE, sev MAC, sev AoV cal with mod AS (MPG 14) G2DD; c.) TTE 09/13/2019: EF 55-60%, mild LAE, mild-mod MR, mild AS (MPG 12.5), G2DD; d.) TTE 08/16/2021: EF 60-65%, mild LVH, mod MAC, mild MR, mod AS (MPG 18.5), G1DD; e.) TTE 12/05/2021: EF 60-65%, mod MAC, s/p TAVR (well seated/functioning valve), mild MR, G1DD   GERD (gastroesophageal reflux disease)    History of chicken pox    History of measles    Hyperlipidemia    Hypertension  LBBB (left bundle branch block)    Long term current use of anticoagulant    a.) apixaban    Low back pain radiating to left lower extremity    PAF (paroxysmal atrial fibrillation) (HCC)    a.) CHA2DS2VASc = 6 (age x2, HTN, CVA x2, vascular disease history);  b.) rate/rhythm maintained on oral metoprolol  succinate;  chronically anticoagulated with apixaban    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   a.) 26mm S3UR via RIGHT TF approach   Skin cancer     PAST SURGICAL HISTORY: Past Surgical History:  Procedure Laterality Date   INTRAOPERATIVE TRANSTHORACIC ECHOCARDIOGRAM N/A 11/06/2021   Procedure: INTRAOPERATIVE TRANSTHORACIC ECHOCARDIOGRAM;  Surgeon: Verlin Lonni BIRCH, MD;  Location: [facility] OR;  Service: Open Heart Surgery;  Laterality: N/A;   IR IMAGING GUIDED PORT INSERTION  08/07/2023   RIGHT/LEFT HEART CATH AND CORONARY ANGIOGRAPHY N/A 09/17/2021   Procedure: RIGHT/LEFT HEART CATH AND CORONARY ANGIOGRAPHY;  Surgeon: Verlin Lonni BIRCH, MD;  Location: [facility] INVASIVE CV LAB;  Service: Cardiovascular;  Laterality: N/A;   ROBOTIC ASSISTED LAPAROSCOPIC CHOLECYSTECTOMY  07/24/2022   SKIN CANCER EXCISION  07/2020   TRANSCATHETER AORTIC VALVE REPLACEMENT, TRANSFEMORAL N/A 11/06/2021   Procedure: Transcatheter Aortic Valve Replacement, Transfemoral;  Surgeon: Verlin Lonni BIRCH, MD;  Location: [facility] OR;  Service: Open Heart Surgery;  Laterality: N/A;    FAMILY HISTORY: Family History  Problem Relation Age of Onset   Arrhythmia Mother        A-Fib   Lung cancer Father    Diabetes Son    Colon cancer Neg Hx    Prostate cancer Neg Hx     ADVANCED DIRECTIVES (Y/N):  N  HEALTH MAINTENANCE: Social History   Tobacco Use   Smoking status: Former    Current packs/day: 0.00    Average packs/day: 1 pack/day for 20.0 years (20.0 ttl pk-yrs)    Types: Cigarettes    Start date: 03/12/1963    Quit date: 03/12/1983    Years since quitting: 40.5    Passive exposure: Past   Smokeless tobacco: Never  Vaping Use   Vaping status: Never Used  Substance Use Topics   Alcohol use: No   Drug use: No     Colonoscopy:  PAP:  Bone density:  Lipid panel:  No Known Allergies  Current Outpatient Medications  Medication Sig Dispense Refill   amLODipine  (NORVASC ) 10 MG tablet Take  1 tablet (10 mg total) by mouth daily. 90 tablet 3   apixaban  (ELIQUIS ) 5 MG TABS tablet TAKE ONE (1) TABLET BY MOUTH TWO TIMES PER DAYTAKE ONE (1) TABLET BY MOUTH TWO TIMES PER DAY, may resume on Friday AM, June 28. 180 tablet 1   atorvastatin  (LIPITOR) 80 MG tablet TAKE ONE TABLET BY MOUTH ONCE DAILY 30 tablet 3   ibuprofen  (ADVIL ) 800 MG tablet Take 1 tablet (800 mg total) by mouth every 8 (eight) hours as needed. 30 tablet 0   metoprolol  succinate (TOPROL -XL) 25 MG 24 hr tablet TAKE ONE AND ONE-HALF TABLETS DAILY 45 tablet 3   ondansetron  (ZOFRAN ) 4 MG tablet Take 1 tablet (4 mg total) by mouth every 8 (eight) hours as needed. 15 tablet 0   ondansetron  (ZOFRAN ) 8 MG tablet Take 1 tablet (8 mg total) by mouth every 8 (eight) hours as needed for nausea or vomiting. Start on the third day after cisplatin . 60 tablet 2   oxyCODONE  (ROXICODONE ) 5 MG immediate release tablet Take 1 tablet (5 mg total) by mouth every 6 (six) hours  as needed. 90 tablet 0   polyethylene glycol powder (GLYCOLAX /MIRALAX ) 17 GM/SCOOP powder Take 17 g by mouth as directed. 510 g 3   prochlorperazine  (COMPAZINE ) 10 MG tablet Take 1 tablet (10 mg total) by mouth every 6 (six) hours as needed (Nausea or vomiting). 60 tablet 2   senna (SENOKOT) 8.6 MG TABS tablet Take 2 tablets (17.2 mg total) by mouth 2 (two) times daily as needed for mild constipation or moderate constipation. 120 tablet 3   traZODone  (DESYREL ) 50 MG tablet Take 1 tablet (50 mg total) by mouth at bedtime as needed for sleep. 30 tablet 0   amoxicillin  (AMOXIL ) 500 MG tablet Take 4 tablets (2,000 mg total) by mouth as directed. 1 hour prior to dental work including cleanings (Patient not taking: Reported on 09/01/2023) 12 tablet 12   amoxicillin -clavulanate (AUGMENTIN ) 875-125 MG tablet Take 1 tablet by mouth 2 (two) times daily. (Patient not taking: Reported on 09/01/2023) 10 tablet 0   Cyanocobalamin (B-12) 3000 MCG CAPS Take 3,000 mcg by mouth daily as needed  (energy). (Patient not taking: Reported on 09/01/2023)     lidocaine -prilocaine  (EMLA ) cream Apply to affected area once (Patient not taking: Reported on 09/01/2023) 30 g 3   No current facility-administered medications for this visit.    OBJECTIVE: Vitals:   09/01/23 1021  BP: 120/72  Pulse: 82  Resp: 18  Temp: 97.6 F (36.4 C)  SpO2: 100%     Body mass index is 26.29 kg/m.    ECOG FS:1 - Symptomatic but completely ambulatory  General: Well-developed, well-nourished, no acute distress. Eyes: Pink conjunctiva, anicteric sclera. HEENT: Normocephalic, moist mucous membranes. Lungs: No audible wheezing or coughing. Heart: Regular rate and rhythm. Abdomen: Soft, nontender, no obvious distention. Musculoskeletal: No edema, cyanosis, or clubbing. Neuro: Alert, answering all questions appropriately. Cranial nerves grossly intact. Skin: No rashes or petechiae noted. Psych: Normal affect.  LAB RESULTS:  Lab Results  Component Value Date   NA 135 09/01/2023   K 3.8 09/01/2023   CL 105 09/01/2023   CO2 24 09/01/2023   GLUCOSE 149 (H) 09/01/2023   BUN 27 (H) 09/01/2023   CREATININE 1.45 (H) 09/01/2023   CALCIUM  8.0 (L) 09/01/2023   PROT 5.6 (L) 09/01/2023   ALBUMIN 2.7 (L) 09/01/2023   AST 41 09/01/2023   ALT 23 09/01/2023   ALKPHOS 223 (H) 09/01/2023   BILITOT 0.5 09/01/2023   GFRNONAA 48 (L) 09/01/2023   GFRAA 81 08/25/2019    Lab Results  Component Value Date   WBC 18.2 (H) 09/01/2023   NEUTROABS 14.8 (H) 09/01/2023   HGB 5.5 (LL) 09/01/2023   HCT 17.5 (L) 09/01/2023   MCV 87.1 09/01/2023   PLT 190 09/01/2023     STUDIES: IR IMAGING GUIDED PORT INSERTION Result Date: 08/07/2023 CLINICAL DATA:  Peritoneal carcinomatosis and need for porta cath to begin chemotherapy. EXAM: IMPLANTED PORT A CATH PLACEMENT WITH ULTRASOUND AND FLUOROSCOPIC GUIDANCE ANESTHESIA/SEDATION: Moderate (conscious) sedation was employed during this procedure. A total of Versed  2.0 mg and Fentanyl   during mcg was administered intravenously. Moderate Sedation Time: 42 minutes. The patient's level of consciousness and vital signs were monitored continuously by radiology nursing throughout the procedure under my direct supervision. Additional Medications: 2 g IV Ancef . FLUOROSCOPY: Radiation Exposure Index: 21.2 mGy Kerma PROCEDURE: The procedure, risks, benefits, and alternatives were explained to the patient. Questions regarding the procedure were encouraged and answered. The patient understands and consents to the procedure. A time-out was performed prior to initiating the  procedure. Ultrasound was utilized to confirm patency of the right internal jugular vein. An ultrasound image was saved and recorded. The right neck and chest were prepped with chlorhexidine  in a sterile fashion, and a sterile drape was applied covering the operative field. Maximum barrier sterile technique with sterile gowns and gloves were used for the procedure. Local anesthesia was provided with 1% lidocaine . After creating a small venotomy incision, a 21 gauge needle was advanced into the right internal jugular vein under direct, real-time ultrasound guidance. Ultrasound image documentation was performed. After securing guidewire access, an 8 Fr dilator was placed. A J-wire was kinked to measure appropriate catheter length. A subcutaneous port pocket was then created along the upper chest wall utilizing sharp and blunt dissection. Portable cautery was utilized. The pocket was irrigated with sterile saline. A single lumen power injectable port was chosen for placement. The 8 Fr catheter was tunneled from the port pocket site to the venotomy incision. The port was placed in the pocket. External catheter was trimmed to appropriate length based on guidewire measurement. At the venotomy, an 8 Fr peel-away sheath was placed over a guidewire. The catheter was then placed through the sheath and the sheath removed. Final catheter positioning was  confirmed and documented with a fluoroscopic spot image. The port was accessed with a needle and aspirated and flushed with heparinized saline. The access needle was removed. The venotomy and port pocket incisions were closed with subcutaneous 3-0 Monocryl and subcuticular 4-0 Vicryl. Dermabond was applied to both incisions. COMPLICATIONS: COMPLICATIONS None FINDINGS: After catheter placement, the tip lies at the cavo-atrial junction. The catheter aspirates normally and is ready for immediate use. IMPRESSION: Placement of single lumen port a cath via right internal jugular vein. The catheter tip lies at the cavo-atrial junction. A power injectable port a cath was placed and is ready for immediate use. Electronically Signed   By: Marcey Moan M.D.   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  PET scan on July 15, 2023 revealed greater than 30 liver lesions as well as carcinomatosis.  Subsequent biopsy a poorly differentiated carcinoma consistent with either upper GI or pancreatobiliary origin.  Tumor markers are negative.  NGS testing is pending at time of dictation.  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  This will be a 21-day cycle.  Patient has had port placed.  Patient received 2 units of packed red blood cells tomorrow and then return to clinic in 2 days for cycle 2, day 1.  Patient will then return to clinic next week for further evaluation and consideration of cycle 2, day 8.  Plan to reimage at the conclusion of cycle 4.     Abdominal pain: Improved.  Continue oxycodone  as prescribed. Hyponatremia: Resolved. Transaminitis: Resolved.   Anemia: Hemoglobin trended down to 5.5.  2 units of packed red blood cells tomorrow and then treatment the following day. Leukocytosis: Chronic and unchanged.  Likely reactive.   Patient expressed understanding and was in agreement with this plan. He also understands that He can call clinic at any time with any questions, concerns,  or complaints.    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

## 2023-09-02 ENCOUNTER — Inpatient Hospital Stay

## 2023-09-02 ENCOUNTER — Encounter: Payer: Self-pay | Admitting: Oncology

## 2023-09-02 DIAGNOSIS — Z5112 Encounter for antineoplastic immunotherapy: Secondary | ICD-10-CM | POA: Diagnosis not present

## 2023-09-02 DIAGNOSIS — C786 Secondary malignant neoplasm of retroperitoneum and peritoneum: Secondary | ICD-10-CM

## 2023-09-02 MED ORDER — DIPHENHYDRAMINE HCL 50 MG/ML IJ SOLN
25.0000 mg | Freq: Once | INTRAMUSCULAR | Status: AC
  Administered 2023-09-02: 25 mg via INTRAVENOUS
  Filled 2023-09-02: qty 1

## 2023-09-02 MED ORDER — SODIUM CHLORIDE 0.9% IV SOLUTION
250.0000 mL | INTRAVENOUS | Status: DC
  Administered 2023-09-02: 100 mL via INTRAVENOUS
  Filled 2023-09-02: qty 250

## 2023-09-02 MED ORDER — ACETAMINOPHEN 325 MG PO TABS
650.0000 mg | ORAL_TABLET | Freq: Once | ORAL | Status: AC
  Administered 2023-09-02: 650 mg via ORAL
  Filled 2023-09-02: qty 2

## 2023-09-02 MED FILL — Fosaprepitant Dimeglumine For IV Infusion 150 MG (Base Eq): INTRAVENOUS | Qty: 5 | Status: AC

## 2023-09-02 NOTE — Addendum Note (Signed)
 Encounter addended by: Janice Lynwood BROCKS on: 09/02/2023 1:56 PM  Actions taken: Imaging Exam ended

## 2023-09-02 NOTE — Progress Notes (Signed)
 Nutrition Follow-up:  Patient with stage IV possible biliary tract primary (upper GI or pancreatobiliary).  Patient receiving cisplatin , gemcitabine , and durvalumab .  Met with patient in infusion. Reports that appetite is better.  Drinking some ensure shakes likes them better than some of the others.  Breakfast is usually toast with jelly and fruit.  Lunch is chicken salad.  Dinner is meat and vegetables usually.  Likes orange juice and chocolate milk.  Does not like cheese.      Medications: reviewed  Labs: reviewed  Anthropometrics:   Weight 158 lb  154 lb on 7/21 167 lb on 6/26 183 lb on 01/09/23    NUTRITION DIAGNOSIS: Inadequate oral intake improving    INTERVENTION:  Continue foods rich in protein Continue oral nutrition supplement as able Continue small frequent meals/snacks    MONITORING, EVALUATION, GOAL: weight trends, intake   NEXT VISIT: Tuesday Sept 2nd, during infusion  Sims Laday B. Dasie SOLON, CSO, LDN Registered Dietitian [phone]

## 2023-09-02 NOTE — Progress Notes (Signed)
 [facility] CSW Progress Note  Visual merchandiser met with caregiver to follow-up on financial concerns.    Interventions: Met with patient's wife, [name].  Provided her with a letter from Dr. Jacobo regarding patient's diagnosis and treatment.  She also inquired about medical records for patient's insurance.  Provided phone number for medical records.  She expressed no other needs at this time.      Follow Up Plan:  CSW will follow-up with patient by phone     Macario CHRISTELLA Au, LCSW Clinical Social Worker [facility]

## 2023-09-02 NOTE — Patient Instructions (Signed)

## 2023-09-03 ENCOUNTER — Encounter: Payer: Self-pay | Admitting: Oncology

## 2023-09-03 ENCOUNTER — Other Ambulatory Visit: Payer: Self-pay | Admitting: Oncology

## 2023-09-03 ENCOUNTER — Encounter

## 2023-09-03 ENCOUNTER — Inpatient Hospital Stay

## 2023-09-03 VITALS — BP 127/73 | HR 87 | Temp 96.7°F | Resp 18

## 2023-09-03 DIAGNOSIS — C786 Secondary malignant neoplasm of retroperitoneum and peritoneum: Secondary | ICD-10-CM

## 2023-09-03 DIAGNOSIS — Z5112 Encounter for antineoplastic immunotherapy: Secondary | ICD-10-CM | POA: Diagnosis not present

## 2023-09-03 LAB — BPAM RBC
Blood Product Expiration Date: 202508232359
Blood Product Unit Number: 202508282359
ISSUE DATE / TIME: 202508050859
PRODUCT CODE: 202508051119
PRODUCT CODE: 202508232359
Unit Type and Rh: 9500
Unit Type and Rh: 202508282359
Unit Type and Rh: 5100
Unit Type and Rh: 9500

## 2023-09-03 LAB — TYPE AND SCREEN
ABO/RH(D): O POS
Antibody Screen: NEGATIVE
Unit division: 0
Unit division: 0

## 2023-09-03 MED ORDER — SODIUM CHLORIDE 0.9 % IV SOLN: 25.0000 mg/m2 | Freq: Once | INTRAVENOUS | Status: DC

## 2023-09-03 MED ORDER — SODIUM CHLORIDE 0.9 % IV SOLN
1000.0000 mg/m2 | Freq: Once | INTRAVENOUS | Status: AC
  Administered 2023-09-03: 1862 mg via INTRAVENOUS
  Filled 2023-09-03: qty 48.97

## 2023-09-03 MED ORDER — SODIUM CHLORIDE 0.9 % IV SOLN
INTRAVENOUS | Status: DC
  Filled 2023-09-03: qty 250

## 2023-09-03 MED ORDER — SODIUM CHLORIDE 0.9 % IV SOLN
1500.0000 mg | Freq: Once | INTRAVENOUS | Status: AC
  Administered 2023-09-03: 1500 mg via INTRAVENOUS
  Filled 2023-09-03: qty 30

## 2023-09-03 MED ORDER — POTASSIUM CHLORIDE IN NACL 20-0.9 MEQ/L-% IV SOLN
Freq: Once | INTRAVENOUS | Status: AC
  Filled 2023-09-03: qty 1000

## 2023-09-03 MED ORDER — SODIUM CHLORIDE 0.9 % IV SOLN
12.5000 mg/m2 | Freq: Once | INTRAVENOUS | Status: AC
  Administered 2023-09-03: 23 mg via INTRAVENOUS
  Filled 2023-09-03: qty 23

## 2023-09-03 MED ORDER — DEXAMETHASONE SODIUM PHOSPHATE 10 MG/ML IJ SOLN
10.0000 mg | Freq: Once | INTRAMUSCULAR | Status: AC
  Administered 2023-09-03: 10 mg via INTRAVENOUS
  Filled 2023-09-03: qty 1

## 2023-09-03 MED ORDER — MAGNESIUM SULFATE 2 GM/50ML IV SOLN
2.0000 g | Freq: Once | INTRAVENOUS | Status: AC
  Administered 2023-09-03: 2 g via INTRAVENOUS
  Filled 2023-09-03: qty 50

## 2023-09-03 MED ORDER — FOSAPREPITANT DIMEGLUMINE INJECTION 150 MG
150.0000 mg | Freq: Once | INTRAVENOUS | Status: AC
  Administered 2023-09-03: 150 mg via INTRAVENOUS
  Filled 2023-09-03: qty 150

## 2023-09-03 MED ORDER — SODIUM CHLORIDE 0.9% FLUSH
10.0000 mL | INTRAVENOUS | Status: DC | PRN
  Filled 2023-09-03: qty 10

## 2023-09-03 MED ORDER — PALONOSETRON HCL INJECTION 0.25 MG/5ML
0.2500 mg | Freq: Once | INTRAVENOUS | Status: AC
  Administered 2023-09-03: 0.25 mg via INTRAVENOUS
  Filled 2023-09-03: qty 5

## 2023-09-03 NOTE — Progress Notes (Signed)
 Keep cisplatin  dose at 12.5 mg/m2 today per Dr. Melanee.

## 2023-09-03 NOTE — Progress Notes (Signed)
 Dr melanee covering ok to keep cisplatin  dose at 12.5mg /m2

## 2023-09-03 NOTE — Patient Instructions (Signed)
 [facility] - A DEPT OF [facility]  Discharge Instructions: Thank you for choosing [facility] to provide your oncology and hematology care.  If you have a lab appointment with the Cancer Center, please go directly to the Cancer Center and check in at the registration area.  Wear comfortable clothing and clothing appropriate for easy access to any Portacath or PICC line.   We strive to give you quality time with your provider. You may need to reschedule your appointment if you arrive late (15 or more minutes).  Arriving late affects you and other patients whose appointments are after yours.  Also, if you miss three or more appointments without notifying the office, you may be dismissed from the clinic at the provider's discretion.      For prescription refill requests, have your pharmacy contact our office and allow 72 hours for refills to be completed.    Today you received the following chemotherapy and/or immunotherapy agents: CISplatin  / durvalumab  /gemcitabine    To help prevent nausea and vomiting after your treatment, we encourage you to take your nausea medication as directed.  BELOW ARE SYMPTOMS THAT SHOULD BE REPORTED IMMEDIATELY: *FEVER GREATER THAN 100.4 F (38 C) OR HIGHER *CHILLS OR SWEATING *NAUSEA AND VOMITING THAT IS NOT CONTROLLED WITH YOUR NAUSEA MEDICATION *UNUSUAL SHORTNESS OF BREATH *UNUSUAL BRUISING OR BLEEDING *URINARY PROBLEMS (pain or burning when urinating, or frequent urination) *BOWEL PROBLEMS (unusual diarrhea, constipation, pain near the anus) TENDERNESS IN MOUTH AND THROAT WITH OR WITHOUT PRESENCE OF ULCERS (sore throat, sores in mouth, or a toothache) UNUSUAL RASH, SWELLING OR PAIN  UNUSUAL VAGINAL DISCHARGE OR ITCHING   Items with * indicate a potential emergency and should be followed up as soon as possible or go to the Emergency Department if any problems should occur.  Please show the CHEMOTHERAPY ALERT  CARD or IMMUNOTHERAPY ALERT CARD at check-in to the Emergency Department and triage nurse.  Should you have questions after your visit or need to cancel or reschedule your appointment, please contact [facility] - A DEPT OF [facility]  [phone] and follow the prompts.  Office hours are 8:00 a.m. to 4:30 p.m. Monday - Friday. Please note that voicemails left after 4:00 p.m. may not be returned until the following business day.  We are closed weekends and major holidays. You have access to a nurse at all times for urgent questions. Please call the main number to the clinic [phone] and follow the prompts.  For any non-urgent questions, you may also contact your provider using MyChart. We now offer e-Visits for anyone 25 and older to request care online for non-urgent symptoms. For details visit [email or web address].   Also download the MyChart app! Go to the app store, search MyChart, open the app, select [facility], and log in with your MyChart username and password.

## 2023-09-03 NOTE — Progress Notes (Signed)
 Ok to run pre/post fluids with Cisplatin 

## 2023-09-03 NOTE — Progress Notes (Signed)
 Per MD note proceed with treatment. Per 09/01/23 note when he was seen by Dr. Jacobo, with a hemoglobin of 5.5, he was to receive 2 units PRBCs 09/02/23 (yesterday) and then treatment 2 days later (today)

## 2023-09-05 ENCOUNTER — Telehealth: Payer: Self-pay

## 2023-09-05 NOTE — Telephone Encounter (Signed)
 error

## 2023-09-05 NOTE — Telephone Encounter (Signed)
 I had to reorder Patients, 4 pronged cane, and the shower chair with back, which I had Tinnie Dawn, NP resign all paperwork so I can refax them over to [employer].

## 2023-09-08 ENCOUNTER — Other Ambulatory Visit

## 2023-09-08 ENCOUNTER — Ambulatory Visit: Admitting: Oncology

## 2023-09-09 ENCOUNTER — Ambulatory Visit

## 2023-09-09 ENCOUNTER — Other Ambulatory Visit: Payer: Self-pay | Admitting: *Deleted

## 2023-09-09 DIAGNOSIS — C786 Secondary malignant neoplasm of retroperitoneum and peritoneum: Secondary | ICD-10-CM

## 2023-09-09 MED FILL — Fosaprepitant Dimeglumine For IV Infusion 150 MG (Base Eq): INTRAVENOUS | Qty: 5 | Status: AC

## 2023-09-10 ENCOUNTER — Encounter: Payer: Self-pay | Admitting: Oncology

## 2023-09-10 ENCOUNTER — Inpatient Hospital Stay

## 2023-09-10 ENCOUNTER — Inpatient Hospital Stay: Admitting: Oncology

## 2023-09-10 ENCOUNTER — Encounter

## 2023-09-10 VITALS — BP 118/58 | HR 68 | Temp 96.0°F | Resp 18 | Ht 65.0 in | Wt 158.0 lb

## 2023-09-10 DIAGNOSIS — C786 Secondary malignant neoplasm of retroperitoneum and peritoneum: Secondary | ICD-10-CM

## 2023-09-10 DIAGNOSIS — Z5112 Encounter for antineoplastic immunotherapy: Secondary | ICD-10-CM | POA: Diagnosis not present

## 2023-09-10 LAB — CMP (CANCER CENTER ONLY)
ALT: 31 U/L (ref 0–44)
AST: 36 U/L (ref 15–41)
Albumin: 2.2 g/dL — ABNORMAL LOW (ref 3.5–5.0)
Alkaline Phosphatase: 176 U/L — ABNORMAL HIGH (ref 38–126)
Anion gap: 6 (ref 5–15)
BUN: 28 mg/dL — ABNORMAL HIGH (ref 8–23)
CO2: 22 mmol/L (ref 22–32)
Calcium: 8.2 mg/dL — ABNORMAL LOW (ref 8.9–10.3)
Chloride: 107 mmol/L (ref 98–111)
Creatinine: 1.32 mg/dL — ABNORMAL HIGH (ref 0.61–1.24)
GFR, Estimated: 54 mL/min — ABNORMAL LOW (ref >60)
Glucose, Bld: 136 mg/dL — ABNORMAL HIGH (ref 70–99)
Potassium: 3.7 mmol/L (ref 3.5–5.1)
Sodium: 135 mmol/L (ref 135–145)
Total Bilirubin: 0.6 mg/dL (ref 0.0–1.2)
Total Protein: 5.6 g/dL — ABNORMAL LOW (ref 6.5–8.1)

## 2023-09-10 LAB — CBC WITH DIFFERENTIAL (CANCER CENTER ONLY)
Abs Immature Granulocytes: 0.1 K/uL — ABNORMAL HIGH (ref 0.00–0.07)
Basophils Absolute: 0 K/uL (ref 0.0–0.1)
Basophils Relative: 0 %
Eosinophils Absolute: 0 K/uL (ref 0.0–0.5)
Eosinophils Relative: 0 %
HCT: 15.8 % — ABNORMAL LOW (ref 39.0–52.0)
Hemoglobin: 5 g/dL — CL (ref 13.0–17.0)
Immature Granulocytes: 1 %
Lymphocytes Relative: 12 %
Lymphs Abs: 0.9 K/uL (ref 0.7–4.0)
MCH: 28.1 pg (ref 26.0–34.0)
MCHC: 31.6 g/dL (ref 30.0–36.0)
MCV: 88.8 fL (ref 80.0–100.0)
Monocytes Absolute: 1.1 K/uL — ABNORMAL HIGH (ref 0.1–1.0)
Monocytes Relative: 15 %
Neutro Abs: 5.6 K/uL (ref 1.7–7.7)
Neutrophils Relative %: 72 %
Platelet Count: 83 K/uL — ABNORMAL LOW (ref 150–400)
RBC: 1.78 MIL/uL — ABNORMAL LOW (ref 4.22–5.81)
RDW: 17.9 % — ABNORMAL HIGH (ref 11.5–15.5)
WBC Count: 7.8 K/uL (ref 4.0–10.5)
nRBC: 0 % (ref 0.0–0.2)

## 2023-09-10 LAB — PREPARE RBC (CROSSMATCH)

## 2023-09-10 LAB — MAGNESIUM: Magnesium: 1.7 mg/dL (ref 1.7–2.4)

## 2023-09-10 NOTE — Progress Notes (Signed)
 Patient is feeling his A fib more recently. He is having severe right side pain. He rates his pain at about a 6-8 today. He is having more shortness of breath.

## 2023-09-10 NOTE — Progress Notes (Signed)
 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    INTERVAL HISTORY: Patient returns to clinic today for further evaluation and consideration of cycle 2, day 8 of cisplatin , gemcitabine , and durvalumab .  He has chronic weakness and fatigue, but otherwise feels well.  He does not complain of abdominal pain today.  He has a fair appetite.  He has no neurologic complaints.  He denies any recent fevers or illnesses.  He has no chest pain, shortness of breath, cough, or hemoptysis.  He denies any nausea, vomiting, constipation, or diarrhea.  He has no melena or hematochezia.  He has no urinary complaints.  Patient offers no further specific complaints today.  REVIEW OF SYSTEMS:   Review of Systems  Constitutional:  Positive for malaise/fatigue. Negative for fever and weight loss.  Respiratory: Negative.  Negative for cough, hemoptysis and shortness of breath.   Cardiovascular: Negative.  Negative for chest pain and leg swelling.  Gastrointestinal: Negative.  Negative for abdominal pain, blood in stool, constipation, diarrhea, melena, nausea and vomiting.  Genitourinary: Negative.  Negative for dysuria.  Musculoskeletal: Negative.  Negative for back pain.  Skin: Negative.  Negative for rash.  Neurological: Negative.  Negative for dizziness, focal weakness, weakness and headaches.  Psychiatric/Behavioral: Negative.  The patient is not nervous/anxious.      As per HPI. Otherwise, a complete review of systems is negative.  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   CAD (coronary artery disease) 09/17/2021   a.) R/LHC 09/17/2021: 30% pRCA, 40% dRCA, 50% pLCx, 30% dLCx, 50% pLAD, 50% mLAD, 50% dLAD --> med mgmt   Cardiac murmur    Cervical spondylosis    Cholelithiasis    CRAO (central retinal artery occlusion), 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   CVA (cerebral vascular accident) (HCC) 03/13/2018   a.) MRI brain 03/14/2018 --> acute infarct in the posterior RIGHT frontal lobe; small chronic cerebellar infarct --> Tx'd with TPA with (+) improvement/resolution of symptoms   Diastolic dysfunction    a.) TTE 01/31/2015: EF 55-60%, MAC, G1DD; b.) TTE 03/14/2018: EF 60-65%, mild LAE, sev MAC, sev AoV cal with mod AS (MPG 14) G2DD; c.) TTE 09/13/2019: EF 55-60%, mild LAE, mild-mod MR, mild AS (MPG 12.5), G2DD; d.) TTE 08/16/2021: EF 60-65%, mild LVH, mod MAC, mild MR, mod AS (MPG 18.5), G1DD; e.) TTE 12/05/2021: EF 60-65%, mod MAC, s/p TAVR (well seated/functioning valve), mild MR, G1DD   GERD (gastroesophageal reflux disease)    History of chicken pox    History of measles    Hyperlipidemia    Hypertension    LBBB (left bundle branch block)    Long term current use of anticoagulant    a.) apixaban   Low back pain radiating to left lower extremity    PAF (paroxysmal atrial fibrillation) (HCC)    a.) CHA2DS2VASc = 6 (age x2, HTN, CVA x2, vascular disease history);  b.) rate/rhythm maintained on oral metoprolol  succinate; chronically anticoagulated with apixaban    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   a.) 26mm S3UR via RIGHT TF  approach   Skin cancer     PAST SURGICAL HISTORY: Past Surgical History:  Procedure Laterality Date   INTRAOPERATIVE TRANSTHORACIC ECHOCARDIOGRAM N/A 11/06/2021   Procedure: INTRAOPERATIVE TRANSTHORACIC ECHOCARDIOGRAM;  Surgeon: Verlin Lonni BIRCH, MD;  Location: [facility] OR;  Service: Open Heart Surgery;  Laterality: N/A;   IR IMAGING GUIDED PORT INSERTION  08/07/2023   RIGHT/LEFT HEART CATH AND CORONARY ANGIOGRAPHY N/A 09/17/2021   Procedure: RIGHT/LEFT HEART CATH AND CORONARY ANGIOGRAPHY;  Surgeon: Verlin Lonni BIRCH, MD;  Location: [facility] INVASIVE CV LAB;  Service: Cardiovascular;  Laterality: N/A;   ROBOTIC ASSISTED LAPAROSCOPIC CHOLECYSTECTOMY  07/24/2022   SKIN CANCER EXCISION  07/2020   TRANSCATHETER AORTIC VALVE REPLACEMENT, TRANSFEMORAL N/A 11/06/2021   Procedure: Transcatheter Aortic Valve Replacement, Transfemoral;  Surgeon: Verlin Lonni BIRCH, MD;  Location: [facility] OR;  Service: Open Heart Surgery;  Laterality: N/A;    FAMILY HISTORY: Family History  Problem Relation Age of Onset   Arrhythmia Mother        A-Fib   Lung cancer Father    Diabetes Son    Colon cancer Neg Hx    Prostate cancer Neg Hx     ADVANCED DIRECTIVES (Y/N):  N  HEALTH MAINTENANCE: Social History   Tobacco Use   Smoking status: Former    Current packs/day: 0.00    Average packs/day: 1 pack/day for 20.0 years (20.0 ttl pk-yrs)    Types: Cigarettes    Start date: 03/12/1963    Quit date: 03/12/1983    Years since quitting: 40.5    Passive exposure: Past   Smokeless tobacco: Never  Vaping Use   Vaping status: Never Used  Substance Use Topics   Alcohol use: No   Drug use: No     Colonoscopy:  PAP:  Bone density:  Lipid panel:  No Known Allergies  Current Outpatient Medications  Medication Sig Dispense Refill   amLODipine  (NORVASC ) 10 MG tablet Take 1 tablet (10 mg total) by mouth daily. 90 tablet 3   apixaban  (ELIQUIS ) 5 MG TABS tablet TAKE ONE (1) TABLET BY MOUTH TWO TIMES PER  DAYTAKE ONE (1) TABLET BY MOUTH TWO TIMES PER DAY, may resume on Friday AM, June 28. 180 tablet 1   atorvastatin  (LIPITOR) 80 MG tablet TAKE ONE TABLET BY MOUTH ONCE DAILY 30 tablet 3   ibuprofen  (ADVIL ) 800 MG tablet Take 1 tablet (800 mg total) by mouth every 8 (eight) hours as needed. 30 tablet 0   metoprolol  succinate (TOPROL -XL) 25 MG 24 hr tablet TAKE ONE AND ONE-HALF TABLETS DAILY 45 tablet 3   ondansetron  (ZOFRAN ) 4 MG tablet Take 1 tablet (4 mg total) by mouth every 8 (eight) hours as needed. 15 tablet 0   ondansetron  (ZOFRAN ) 8 MG tablet Take 1 tablet (8 mg total) by mouth every 8 (eight) hours as needed for nausea or vomiting. Start on the third day after cisplatin . 60 tablet 2   oxyCODONE  (ROXICODONE ) 5 MG immediate release tablet Take 1 tablet (5 mg total) by mouth every 6 (six) hours as needed. 90 tablet 0   polyethylene glycol powder (GLYCOLAX /MIRALAX ) 17 GM/SCOOP powder Take 17 g by mouth as directed.  510 g 3   prochlorperazine  (COMPAZINE ) 10 MG tablet Take 1 tablet (10 mg total) by mouth every 6 (six) hours as needed (Nausea or vomiting). 60 tablet 2   senna (SENOKOT) 8.6 MG TABS tablet Take 2 tablets (17.2 mg total) by mouth 2 (two) times daily as needed for mild constipation or moderate constipation. 120 tablet 3   traZODone  (DESYREL ) 50 MG tablet Take 1 tablet (50 mg total) by mouth at bedtime as needed for sleep. 30 tablet 0   amoxicillin  (AMOXIL ) 500 MG tablet Take 4 tablets (2,000 mg total) by mouth as directed. 1 hour prior to dental work including cleanings (Patient not taking: Reported on 09/10/2023) 12 tablet 12   amoxicillin -clavulanate (AUGMENTIN ) 875-125 MG tablet Take 1 tablet by mouth 2 (two) times daily. (Patient not taking: Reported on 09/10/2023) 10 tablet 0   Cyanocobalamin (B-12) 3000 MCG CAPS Take 3,000 mcg by mouth daily as needed (energy). (Patient not taking: Reported on 09/10/2023)     lidocaine -prilocaine  (EMLA ) cream Apply to affected area once (Patient not  taking: Reported on 09/10/2023) 30 g 3   No current facility-administered medications for this visit.    OBJECTIVE: Vitals:   09/10/23 1004  BP: (!) 118/58  Pulse: 68  Resp: 18  Temp: (!) 96 F (35.6 C)  SpO2: 98%     Body mass index is 26.29 kg/m.    ECOG FS:1 - Symptomatic but completely ambulatory  General: Well-developed, well-nourished, no acute distress. Eyes: Pink conjunctiva, anicteric sclera. HEENT: Normocephalic, moist mucous membranes. Lungs: No audible wheezing or coughing. Heart: Regular rate and rhythm. Abdomen: Soft, nontender, no obvious distention. Musculoskeletal: No edema, cyanosis, or clubbing. Neuro: Alert, answering all questions appropriately. Cranial nerves grossly intact. Skin: No rashes or petechiae noted. Psych: Normal affect.  LAB RESULTS:  Lab Results  Component Value Date   NA 135 09/10/2023   K 3.7 09/10/2023   CL 107 09/10/2023   CO2 22 09/10/2023   GLUCOSE 136 (H) 09/10/2023   BUN 28 (H) 09/10/2023   CREATININE 1.32 (H) 09/10/2023   CALCIUM  8.2 (L) 09/10/2023   PROT 5.6 (L) 09/10/2023   ALBUMIN 2.2 (L) 09/10/2023   AST 36 09/10/2023   ALT 31 09/10/2023   ALKPHOS 176 (H) 09/10/2023   BILITOT 0.6 09/10/2023   GFRNONAA 54 (L) 09/10/2023   GFRAA 81 08/25/2019    Lab Results  Component Value Date   WBC 7.8 09/10/2023   NEUTROABS 5.6 09/10/2023   HGB 5.0 (LL) 09/10/2023   HCT 15.8 (L) 09/10/2023   MCV 88.8 09/10/2023   PLT 83 (L) 09/10/2023     STUDIES: No results found.   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  PET scan on July 15, 2023 revealed greater than 30 liver lesions as well as carcinomatosis.  Subsequent biopsy revealed a poorly  differentiated carcinoma consistent with either upper GI or pancreatobiliary origin.  Tumor markers are negative.  NGS testing is pending at time of dictation.  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  This is a 21-day cycle.  Patient has had port placed.  Both gemcitabine  and cisplatin  have now been dose reduced  secondary to persistent anemia.  Hold treatment today secondary to profound anemia and thrombocytopenia.  Return to clinic tomorrow for 2 units of packed red blood cells.  Patient within return to clinic in 1 week for further evaluation and reconsideration of cycle 2, day 8.  Plan to reimage at the conclusion of cycle 4.    Abdominal pain: Patient does not complain of this today.  Continue oxycodone  as prescribed. Anemia: Hemoglobin 5.0 despite recent transfusion.  Dose reduce both cisplatin  and gemcitabine  as above.  Hold treatment as above.   Thrombocytopenia: Mild.  Hold treatment as above. Leukocytosis: Resolved.  Patient expressed understanding and was in agreement with this plan. He also understands that He can call clinic at any time with any questions, concerns, or complaints.    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

## 2023-09-11 ENCOUNTER — Encounter: Payer: Self-pay | Admitting: Nurse Practitioner

## 2023-09-11 ENCOUNTER — Encounter: Payer: Self-pay | Admitting: Oncology

## 2023-09-11 ENCOUNTER — Telehealth: Payer: Self-pay

## 2023-09-11 ENCOUNTER — Ambulatory Visit

## 2023-09-11 ENCOUNTER — Inpatient Hospital Stay

## 2023-09-11 DIAGNOSIS — C786 Secondary malignant neoplasm of retroperitoneum and peritoneum: Secondary | ICD-10-CM

## 2023-09-11 DIAGNOSIS — Z5112 Encounter for antineoplastic immunotherapy: Secondary | ICD-10-CM | POA: Diagnosis not present

## 2023-09-11 MED ORDER — ACETAMINOPHEN 325 MG PO TABS
650.0000 mg | ORAL_TABLET | Freq: Once | ORAL | Status: AC
  Administered 2023-09-11: 650 mg via ORAL
  Filled 2023-09-11: qty 2

## 2023-09-11 MED ORDER — DIPHENHYDRAMINE HCL 50 MG/ML IJ SOLN
25.0000 mg | Freq: Once | INTRAMUSCULAR | Status: AC
  Administered 2023-09-11: 25 mg via INTRAVENOUS
  Filled 2023-09-11: qty 1

## 2023-09-11 MED ORDER — SODIUM CHLORIDE 0.9% IV SOLUTION
250.0000 mL | INTRAVENOUS | Status: DC
  Administered 2023-09-11: 100 mL via INTRAVENOUS
  Filled 2023-09-11: qty 250

## 2023-09-11 NOTE — Progress Notes (Signed)
 [facility] CSW Progress Note  Clinical Child psychotherapist contacted caregiver by phone to follow-up on medical record needs.  CSW received a referral from patient's pharmacist regarding obtaining medical records for patient's insurance.    Interventions: Patient's wife, [name], has attempted to obtain an itemized bill from the hospital which is required by [employer].  [name] expressed having difficulty.  CSW to consult HIM to assist in expediting records.      Follow Up Plan:  CSW will follow-up with patient by phone     Macario CHRISTELLA Au, LCSW Clinical Social Worker [facility]

## 2023-09-11 NOTE — Patient Instructions (Signed)

## 2023-09-11 NOTE — Progress Notes (Signed)
   09/11/2023  Patient ID: Jon Mejia, male   DOB: 08/29/1940, 83 y.o.   MRN: [id number]  This patient is appearing on a report for being at risk of failing the adherence measure for identified medications this calendar year.   Medication Adherence Summary (STAR/HEDIS Monitoring): Adherence Category: cholesterol (statin)    Drug Name: Atorvastatin  80 mg  Last Fill or Sold Date:08/12/2023 Days' Supply: 30  - I called [facility] and was told there are still remaining refills.    Notes: ? Adherence data NOT pulled from pharmacy claims portal Dr. Annemarie. I called [facility] and was given the last filled date. I was told there are other medications that were fill for 90 days supply.  When I called, Jon Mejia wife stated that he is currently at the cancer center. She inquired about obtaining an itemized report of his cancer diagnosis until present day treatment from July 10, 2023, to the present. She also mentioned trying to use the contact number Macario Au, LCSW gave her to obtain that information, but has not been able to reach anyone. Will collaborate with social worker to see if she could offer additional assistance.  ? Reviewed barriers to adherence: days supply . ? Plan: Will send a MyChart message to patient.  Dorcas Solian, PharmD Clinical Pharmacist Cell: [phone]

## 2023-09-12 ENCOUNTER — Inpatient Hospital Stay

## 2023-09-12 ENCOUNTER — Ambulatory Visit

## 2023-09-12 LAB — TYPE AND SCREEN
ABO/RH(D): O POS
Antibody Screen: NEGATIVE
Unit division: 0
Unit division: 0

## 2023-09-12 LAB — BPAM RBC
Blood Product Expiration Date: 202508302359
Blood Product Expiration Date: 202509052359
ISSUE DATE / TIME: 202508140825
ISSUE DATE / TIME: 202508141011
Unit Type and Rh: 5100
Unit Type and Rh: 9500

## 2023-09-12 NOTE — Progress Notes (Signed)
 [facility] CSW Progress Note  Clinical Child psychotherapist contacted caregiver by phone to follow-up on itemized medical bill.    Interventions: CSW spoke with patient's wife, [name].  Informed her that the HIM staff member at the cancer center was not in the office.  Will consult Grayce Riding on Monday.      Follow Up Plan:  CSW will follow-up with patient by phone     Macario CHRISTELLA Au, LCSW Clinical Social Worker [facility]

## 2023-09-15 ENCOUNTER — Telehealth: Payer: Self-pay

## 2023-09-15 ENCOUNTER — Inpatient Hospital Stay

## 2023-09-15 ENCOUNTER — Other Ambulatory Visit: Payer: Self-pay | Admitting: *Deleted

## 2023-09-15 ENCOUNTER — Encounter: Payer: Self-pay | Admitting: Oncology

## 2023-09-15 DIAGNOSIS — C786 Secondary malignant neoplasm of retroperitoneum and peritoneum: Secondary | ICD-10-CM

## 2023-09-15 NOTE — Progress Notes (Signed)
 [facility] CSW Progress Note  Clinical Child psychotherapist contacted caregiver by phone to follow-up on itemized billing issue.    Interventions: Spoke with patient's wife, [name].  Informed her that CSW consulted HIM employee at the cancer center.  She confirmed patient must go through Patient Customer Service to obtain itemized bill.  [name] phoned back and stated she was able to speak with someone in accounting who will get her the needed documents so that she can claim insurance.      Follow Up Plan:  CSW will follow-up with patient by phone     Macario CHRISTELLA Au, LCSW Clinical Social Worker [facility]

## 2023-09-15 NOTE — Telephone Encounter (Signed)
 On 09/10/2023, Jon Mejia was here in office for his scheduled Dr. Appointment with Dr. Jacobo, while I was checking him in he had mentioned that he received the four pronged cane and the shower chair.

## 2023-09-16 ENCOUNTER — Inpatient Hospital Stay (HOSPITAL_BASED_OUTPATIENT_CLINIC_OR_DEPARTMENT_OTHER): Admitting: Oncology

## 2023-09-16 ENCOUNTER — Encounter: Payer: Self-pay | Admitting: Oncology

## 2023-09-16 ENCOUNTER — Inpatient Hospital Stay

## 2023-09-16 VITALS — BP 109/67 | HR 80 | Temp 96.5°F | Resp 18 | Ht 65.0 in | Wt 157.0 lb

## 2023-09-16 DIAGNOSIS — C786 Secondary malignant neoplasm of retroperitoneum and peritoneum: Secondary | ICD-10-CM | POA: Diagnosis not present

## 2023-09-16 DIAGNOSIS — Z5112 Encounter for antineoplastic immunotherapy: Secondary | ICD-10-CM | POA: Diagnosis not present

## 2023-09-16 LAB — CMP (CANCER CENTER ONLY)
ALT: 21 U/L (ref 0–44)
AST: 35 U/L (ref 15–41)
Albumin: 2.3 g/dL — ABNORMAL LOW (ref 3.5–5.0)
Alkaline Phosphatase: 182 U/L — ABNORMAL HIGH (ref 38–126)
Anion gap: 7 (ref 5–15)
BUN: 22 mg/dL (ref 8–23)
CO2: 22 mmol/L (ref 22–32)
Calcium: 8.2 mg/dL — ABNORMAL LOW (ref 8.9–10.3)
Chloride: 105 mmol/L (ref 98–111)
Creatinine: 1.06 mg/dL (ref 0.61–1.24)
GFR, Estimated: >60 mL/min (ref >60)
Glucose, Bld: 116 mg/dL — ABNORMAL HIGH (ref 70–99)
Potassium: 3.6 mmol/L (ref 3.5–5.1)
Sodium: 134 mmol/L — ABNORMAL LOW (ref 135–145)
Total Bilirubin: 0.6 mg/dL (ref 0.0–1.2)
Total Protein: 5.5 g/dL — ABNORMAL LOW (ref 6.5–8.1)

## 2023-09-16 LAB — CBC WITH DIFFERENTIAL/PLATELET
Abs Immature Granulocytes: 0.12 K/uL — ABNORMAL HIGH (ref 0.00–0.07)
Basophils Absolute: 0 K/uL (ref 0.0–0.1)
Basophils Relative: 0 %
Eosinophils Absolute: 0 K/uL (ref 0.0–0.5)
Eosinophils Relative: 0 %
HCT: 18.2 % — ABNORMAL LOW (ref 39.0–52.0)
Hemoglobin: 5.9 g/dL — CL (ref 13.0–17.0)
Immature Granulocytes: 1 %
Lymphocytes Relative: 7 %
Lymphs Abs: 1 K/uL (ref 0.7–4.0)
MCH: 28.9 pg (ref 26.0–34.0)
MCHC: 32.4 g/dL (ref 30.0–36.0)
MCV: 89.2 fL (ref 80.0–100.0)
Monocytes Absolute: 1.2 K/uL — ABNORMAL HIGH (ref 0.1–1.0)
Monocytes Relative: 8 %
Neutro Abs: 11.5 K/uL — ABNORMAL HIGH (ref 1.7–7.7)
Neutrophils Relative %: 84 %
Platelets: 221 K/uL (ref 150–400)
RBC: 2.04 MIL/uL — ABNORMAL LOW (ref 4.22–5.81)
RDW: 17.7 % — ABNORMAL HIGH (ref 11.5–15.5)
WBC: 13.8 K/uL — ABNORMAL HIGH (ref 4.0–10.5)
nRBC: 0 % (ref 0.0–0.2)

## 2023-09-16 LAB — MAGNESIUM: Magnesium: 1.9 mg/dL (ref 1.7–2.4)

## 2023-09-16 NOTE — Progress Notes (Unsigned)
  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    INTERVAL HISTORY: Patient returns to clinic today for further evaluation and reconsideration of cycle 2, day 8 of cisplatin , gemcitabine , and durvalumab .  He continues to have chronic weakness and fatigue, but this is mildly improved after transfusion last week.  He otherwise feels well.  He is tolerating his treatments without significant side effects.  He has no neurologic complaints.  He denies any recent fevers or illnesses.  He has no chest pain, shortness of breath, cough, or hemoptysis.  He does not complain of abdominal pain today.  He denies any nausea, vomiting, constipation, or diarrhea.  He has no melena or hematochezia.  He has no urinary complaints.  Patient offers no further specific complaints today.  REVIEW OF SYSTEMS:   Review of Systems  Constitutional:  Positive for malaise/fatigue. Negative for fever and weight loss.  Respiratory: Negative.  Negative for cough, hemoptysis and shortness of breath.   Cardiovascular: Negative.  Negative for chest pain and leg swelling.  Gastrointestinal: Negative.  Negative for abdominal pain, blood in stool, constipation, diarrhea, melena, nausea and vomiting.  Genitourinary: Negative.  Negative for dysuria.  Musculoskeletal: Negative.  Negative for back pain.  Skin: Negative.  Negative for rash.  Neurological: Negative.  Negative for dizziness, focal  weakness, weakness and headaches.  Psychiatric/Behavioral: Negative.  The patient is not nervous/anxious.     As per HPI. Otherwise, a complete review of systems is negative.  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   CAD (coronary artery disease) 09/17/2021   a.) R/LHC 09/17/2021: 30% pRCA, 40% dRCA, 50% pLCx, 30% dLCx, 50% pLAD, 50% mLAD, 50% dLAD --> med mgmt   Cardiac murmur    Cervical spondylosis    Cholelithiasis    CRAO (central retinal artery occlusion), 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   CVA (cerebral vascular accident) (HCC) 03/13/2018   a.) MRI brain 03/14/2018 --> acute infarct in the posterior RIGHT frontal lobe; small chronic cerebellar infarct --> Tx'd with TPA with (+) improvement/resolution of symptoms   Diastolic dysfunction    a.) TTE 01/31/2015: EF 55-60%, MAC, G1DD; b.) TTE 03/14/2018: EF 60-65%, mild LAE, sev MAC, sev AoV cal with mod AS (MPG 14) G2DD; c.) TTE 09/13/2019: EF 55-60%, mild LAE, mild-mod MR, mild AS (MPG 12.5), G2DD; d.) TTE 08/16/2021: EF 60-65%, mild LVH, mod MAC, mild MR, mod AS (MPG 18.5), G1DD; e.) TTE 12/05/2021: EF 60-65%, mod MAC, s/p TAVR (well seated/functioning valve), mild MR, G1DD   GERD (gastroesophageal reflux disease)    History of chicken pox    History of measles    Hyperlipidemia    Hypertension    LBBB (left bundle branch block)  Long term current use of anticoagulant    a.) apixaban    Low back pain radiating to left lower extremity    PAF (paroxysmal atrial fibrillation) (HCC)    a.) CHA2DS2VASc = 6 (age x2, HTN, CVA x2, vascular disease history);  b.) rate/rhythm maintained on oral metoprolol  succinate; chronically anticoagulated  with apixaban    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   a.) 26mm S3UR via RIGHT TF approach   Skin cancer     PAST SURGICAL HISTORY: Past Surgical History:  Procedure Laterality Date   INTRAOPERATIVE TRANSTHORACIC ECHOCARDIOGRAM N/A 11/06/2021   Procedure: INTRAOPERATIVE TRANSTHORACIC ECHOCARDIOGRAM;  Surgeon: Verlin Lonni BIRCH, MD;  Location: [facility] OR;  Service: Open Heart Surgery;  Laterality: N/A;   IR IMAGING GUIDED PORT INSERTION  08/07/2023   RIGHT/LEFT HEART CATH AND CORONARY ANGIOGRAPHY N/A 09/17/2021   Procedure: RIGHT/LEFT HEART CATH AND CORONARY ANGIOGRAPHY;  Surgeon: Verlin Lonni BIRCH, MD;  Location: [facility] INVASIVE CV LAB;  Service: Cardiovascular;  Laterality: N/A;   ROBOTIC ASSISTED LAPAROSCOPIC CHOLECYSTECTOMY  07/24/2022   SKIN CANCER EXCISION  07/2020   TRANSCATHETER AORTIC VALVE REPLACEMENT, TRANSFEMORAL N/A 11/06/2021   Procedure: Transcatheter Aortic Valve Replacement, Transfemoral;  Surgeon: Verlin Lonni BIRCH, MD;  Location: [facility] OR;  Service: Open Heart Surgery;  Laterality: N/A;    FAMILY HISTORY: Family History  Problem Relation Age of Onset   Arrhythmia Mother        A-Fib   Lung cancer Father    Diabetes Son    Colon cancer Neg Hx    Prostate cancer Neg Hx     ADVANCED DIRECTIVES (Y/N):  N  HEALTH MAINTENANCE: Social History   Tobacco Use   Smoking status: Former    Current packs/day: 0.00    Average packs/day: 1 pack/day for 20.0 years (20.0 ttl pk-yrs)    Types: Cigarettes    Start date: 03/12/1963    Quit date: 03/12/1983    Years since quitting: 40.5    Passive exposure: Past   Smokeless tobacco: Never  Vaping Use   Vaping status: Never Used  Substance Use Topics   Alcohol use: No   Drug use: No     Colonoscopy:  PAP:  Bone density:  Lipid panel:  No Known Allergies  Current Outpatient Medications  Medication Sig Dispense Refill   amLODipine  (NORVASC ) 10 MG tablet Take 1 tablet (10 mg total) by  mouth daily. 90 tablet 3   apixaban  (ELIQUIS ) 5 MG TABS tablet TAKE ONE (1) TABLET BY MOUTH TWO TIMES PER DAYTAKE ONE (1) TABLET BY MOUTH TWO TIMES PER DAY, may resume on Friday AM, June 28. 180 tablet 1   atorvastatin  (LIPITOR) 80 MG tablet TAKE ONE TABLET BY MOUTH ONCE DAILY 30 tablet 3   ibuprofen  (ADVIL ) 800 MG tablet Take 1 tablet (800 mg total) by mouth every 8 (eight) hours as needed. 30 tablet 0   lidocaine -prilocaine  (EMLA ) cream Apply to affected area once 30 g 3   metoprolol  succinate (TOPROL -XL) 25 MG 24 hr tablet TAKE ONE AND ONE-HALF TABLETS DAILY 45 tablet 3   ondansetron  (ZOFRAN ) 4 MG tablet Take 1 tablet (4 mg total) by mouth every 8 (eight) hours as needed. 15 tablet 0   ondansetron  (ZOFRAN ) 8 MG tablet Take 1 tablet (8 mg total) by mouth every 8 (eight) hours as needed for nausea or vomiting. Start on the third day after cisplatin . 60 tablet 2   oxyCODONE  (ROXICODONE ) 5 MG immediate release tablet Take 1 tablet (5 mg total) by  mouth every 6 (six) hours as needed. 90 tablet 0   polyethylene glycol powder (GLYCOLAX /MIRALAX ) 17 GM/SCOOP powder Take 17 g by mouth as directed. 510 g 3   prochlorperazine  (COMPAZINE ) 10 MG tablet Take 1 tablet (10 mg total) by mouth every 6 (six) hours as needed (Nausea or vomiting). 60 tablet 2   senna (SENOKOT) 8.6 MG TABS tablet Take 2 tablets (17.2 mg total) by mouth 2 (two) times daily as needed for mild constipation or moderate constipation. 120 tablet 3   traZODone  (DESYREL ) 50 MG tablet Take 1 tablet (50 mg total) by mouth at bedtime as needed for sleep. 30 tablet 0   amoxicillin  (AMOXIL ) 500 MG tablet Take 4 tablets (2,000 mg total) by mouth as directed. 1 hour prior to dental work including cleanings (Patient not taking: Reported on 09/16/2023) 12 tablet 12   amoxicillin -clavulanate (AUGMENTIN ) 875-125 MG tablet Take 1 tablet by mouth 2 (two) times daily. (Patient not taking: Reported on 09/16/2023) 10 tablet 0   Cyanocobalamin (B-12) 3000 MCG CAPS  Take 3,000 mcg by mouth daily as needed (energy). (Patient not taking: Reported on 09/16/2023)     No current facility-administered medications for this visit.    OBJECTIVE: Vitals:   09/16/23 1024  BP: 109/67  Pulse: 80  Resp: 18  Temp: (!) 96.5 F (35.8 C)  SpO2: 100%     Body mass index is 26.13 kg/m.    ECOG FS:1 - Symptomatic but completely ambulatory  General: Well-developed, well-nourished, no acute distress. Eyes: Pink conjunctiva, anicteric sclera. HEENT: Normocephalic, moist mucous membranes. Lungs: No audible wheezing or coughing. Heart: Regular rate and rhythm. Abdomen: Soft, nontender, no obvious distention. Musculoskeletal: No edema, cyanosis, or clubbing. Neuro: Alert, answering all questions appropriately. Cranial nerves grossly intact. Skin: No rashes or petechiae noted. Psych: Normal affect.  LAB RESULTS:  Lab Results  Component Value Date   NA 134 (L) 09/16/2023   K 3.6 09/16/2023   CL 105 09/16/2023   CO2 22 09/16/2023   GLUCOSE 116 (H) 09/16/2023   BUN 22 09/16/2023   CREATININE 1.06 09/16/2023   CALCIUM  8.2 (L) 09/16/2023   PROT 5.5 (L) 09/16/2023   ALBUMIN 2.3 (L) 09/16/2023   AST 35 09/16/2023   ALT 21 09/16/2023   ALKPHOS 182 (H) 09/16/2023   BILITOT 0.6 09/16/2023   GFRNONAA >60 09/16/2023   GFRAA 81 08/25/2019    Lab Results  Component Value Date   WBC 13.8 (H) 09/16/2023   NEUTROABS 11.5 (H) 09/16/2023   HGB 5.9 (LL) 09/16/2023   HCT 18.2 (L) 09/16/2023   MCV 89.2 09/16/2023   PLT 221 09/16/2023     STUDIES: No results found.   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  PET scan on July 15, 2023 revealed greater than 30 liver lesions as well as  carcinomatosis.  Subsequent biopsy revealed a poorly differentiated carcinoma consistent with either upper GI or pancreatobiliary origin.  Tumor markers are negative.  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  Patient is receiving cisplatin , gemcitabine , and durvalumab  on day 1 with cisplatin  and gemcitabine  only on day 8 followed by G-CSF support.  This is a 21-day cycle.  Patient has had port placed.  Both gemcitabine  and cisplatin  have now been dose reduced secondary to persistent anemia.  Hold treatment once again secondary to profound anemia.  Patient instead will return to clinic tomorrow for 2 units of packed red blood cells.  Return to clinic in 1 week for further evaluation and reconsideration of cycle 2, day 8 of treatment. Plan to reimage at the conclusion of cycle 4.    Abdominal pain: Patient does not complain of this today.  Continue oxycodone  as prescribed. Anemia: Hemoglobin improved to 5.9 with recent transfusion.  Dose reduce both cisplatin  and gemcitabine  as above.  Hold treatment as above.   Thrombocytopenia: Resolved. Leukocytosis: Likely reactive, monitor.  Patient expressed understanding and was in agreement with this plan. He also understands that He can call clinic at any time with any questions, concerns, or complaints.    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

## 2023-09-16 NOTE — Progress Notes (Unsigned)
 Patient needs refills on his emla  cream and his sleep medication, trazodone . He is having pain in his right arm, which Sunday was pretty bad by what his daughter says. He is still having the mid abdominal pain that radiates to his right side.

## 2023-09-17 ENCOUNTER — Other Ambulatory Visit: Payer: Self-pay | Admitting: Nurse Practitioner

## 2023-09-17 ENCOUNTER — Other Ambulatory Visit: Payer: Self-pay

## 2023-09-17 ENCOUNTER — Inpatient Hospital Stay

## 2023-09-17 ENCOUNTER — Telehealth: Payer: Self-pay

## 2023-09-17 DIAGNOSIS — C786 Secondary malignant neoplasm of retroperitoneum and peritoneum: Secondary | ICD-10-CM

## 2023-09-17 DIAGNOSIS — Z5112 Encounter for antineoplastic immunotherapy: Secondary | ICD-10-CM | POA: Diagnosis not present

## 2023-09-17 LAB — PREPARE RBC (CROSSMATCH)

## 2023-09-17 MED ORDER — SODIUM CHLORIDE 0.9% FLUSH
3.0000 mL | INTRAVENOUS | Status: DC | PRN
  Filled 2023-09-17: qty 3

## 2023-09-17 MED ORDER — HEPARIN SOD (PORK) LOCK FLUSH 100 UNIT/ML IV SOLN
500.0000 [IU] | Freq: Every day | INTRAVENOUS | Status: DC | PRN
  Filled 2023-09-17: qty 5

## 2023-09-17 MED ORDER — LIDOCAINE-PRILOCAINE 2.5-2.5 % EX CREA: TOPICAL_CREAM | CUTANEOUS | 3 refills | Status: DC

## 2023-09-17 MED ORDER — DIPHENHYDRAMINE HCL 50 MG/ML IJ SOLN
25.0000 mg | Freq: Once | INTRAMUSCULAR | Status: AC
  Administered 2023-09-17: 25 mg via INTRAVENOUS
  Filled 2023-09-17: qty 1

## 2023-09-17 MED ORDER — TRAZODONE HCL 50 MG PO TABS: 50.0000 mg | ORAL_TABLET | Freq: Every evening | ORAL | 0 refills | Status: DC | PRN

## 2023-09-17 MED ORDER — SODIUM CHLORIDE 0.9% IV SOLUTION
250.0000 mL | INTRAVENOUS | Status: DC
  Administered 2023-09-17: 100 mL via INTRAVENOUS
  Filled 2023-09-17: qty 250

## 2023-09-17 MED ORDER — ACETAMINOPHEN 325 MG PO TABS
650.0000 mg | ORAL_TABLET | Freq: Once | ORAL | Status: AC
  Administered 2023-09-17: 650 mg via ORAL
  Filled 2023-09-17: qty 2

## 2023-09-17 NOTE — Telephone Encounter (Signed)
 Called and spoke with patient, which I spoke with his wife, in regards to the prescription refills needed when he was here at his last doctor visit. He needed his sleep medication, which is the Trazodone , and the EMLA  cream, I tried to put in the medications to be refilled but I was unable to due to too many pends and sends being in the patients chart. I also told the patients spouse that Dr. Jacobo wasn't going to be back in to the office until Monday, so I was trying to get our NP, Tinnie Dawn to sign off the medications to be refilled.

## 2023-09-18 ENCOUNTER — Inpatient Hospital Stay

## 2023-09-19 ENCOUNTER — Inpatient Hospital Stay

## 2023-09-19 ENCOUNTER — Encounter: Payer: Self-pay | Admitting: Nurse Practitioner

## 2023-09-19 ENCOUNTER — Encounter: Payer: Self-pay | Admitting: Oncology

## 2023-09-20 LAB — BPAM RBC
Blood Product Expiration Date: 202509152359
Blood Product Expiration Date: 202509162359
ISSUE DATE / TIME: 202508200850
ISSUE DATE / TIME: 202508201023
Unit Type and Rh: 5100
Unit Type and Rh: 9500

## 2023-09-20 LAB — TYPE AND SCREEN
ABO/RH(D): O POS
Antibody Screen: NEGATIVE
Unit division: 0
Unit division: 0

## 2023-09-22 ENCOUNTER — Other Ambulatory Visit: Payer: Self-pay | Admitting: Nurse Practitioner

## 2023-09-22 ENCOUNTER — Other Ambulatory Visit

## 2023-09-22 ENCOUNTER — Ambulatory Visit

## 2023-09-22 ENCOUNTER — Ambulatory Visit: Admitting: Oncology

## 2023-09-23 ENCOUNTER — Other Ambulatory Visit: Payer: Self-pay | Admitting: Oncology

## 2023-09-23 ENCOUNTER — Encounter: Payer: Self-pay | Admitting: Nurse Practitioner

## 2023-09-23 ENCOUNTER — Inpatient Hospital Stay (HOSPITAL_BASED_OUTPATIENT_CLINIC_OR_DEPARTMENT_OTHER): Admitting: Hospice and Palliative Medicine

## 2023-09-23 ENCOUNTER — Encounter: Payer: Self-pay | Admitting: Oncology

## 2023-09-23 DIAGNOSIS — Z515 Encounter for palliative care: Secondary | ICD-10-CM

## 2023-09-23 DIAGNOSIS — C786 Secondary malignant neoplasm of retroperitoneum and peritoneum: Secondary | ICD-10-CM

## 2023-09-23 NOTE — Progress Notes (Signed)
 Virtual Visit via Telephone Note  I connected with Jon Mejia on 09/23/23 at  1:40 PM EDT by telephone and verified that I am speaking with the correct person using two identifiers.  Location: Patient: Home Provider: Clinic   I discussed the limitations, risks, security and privacy concerns of performing an evaluation and management service by telephone and the availability of in person appointments. I also discussed with the patient that there may be a patient responsible charge related to this service. The patient expressed understanding and agreed to proceed.   History of Present Illness: Jon Mejia is a 83 y.o. 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   Observations/Objective: I called and spoke with patient by phone.  He reports that he is slowly improving in regards to performance status.  He is using a fork prong cane to provide stability.  He denies any recent falls.  He says that his appetite is fair.  He reports stable pain with use of as needed oxycodone .  He is tolerating medications well without adverse effects.  He denies other symptomatic complaints or concerns.  Assessment and Plan: Stage IV poorly differentiated carcinoma -on treatment with cis plus gemcitabine  plus Durvalumab   Neoplasm related pain -continue as needed oxycodone .  Daily bowel regimen  Follow Up Instructions: Follow-up 1 month   I discussed the assessment and treatment plan with the patient. The patient was provided an opportunity to ask questions and all were answered. The patient agreed with the plan and demonstrated an understanding of the instructions.   The patient was advised to call back or seek an in-person evaluation if the symptoms worsen or if the condition fails to improve as anticipated.  I provided 10 minutes of non-face-to-face time during this encounter.   FONDA JONELLE MOWER, NP

## 2023-09-25 ENCOUNTER — Encounter: Payer: Self-pay | Admitting: Hospitalist

## 2023-09-25 ENCOUNTER — Inpatient Hospital Stay
Admission: AD | Admit: 2023-09-25 | Discharge: 2023-09-29 | DRG: 811 | Disposition: A | Discharge status: Home / Self Care | Source: Ambulatory Visit | Attending: Internal Medicine | Admitting: Internal Medicine

## 2023-09-25 ENCOUNTER — Inpatient Hospital Stay

## 2023-09-25 ENCOUNTER — Encounter (HOSPITAL_COMMUNITY): Payer: Self-pay

## 2023-09-25 ENCOUNTER — Encounter: Payer: Self-pay | Admitting: Oncology

## 2023-09-25 ENCOUNTER — Inpatient Hospital Stay: Admitting: Oncology

## 2023-09-25 ENCOUNTER — Other Ambulatory Visit: Payer: Self-pay

## 2023-09-25 VITALS — BP 95/81 | HR 106 | Temp 98.7°F | Resp 16 | Ht 65.0 in | Wt 152.0 lb

## 2023-09-25 DIAGNOSIS — Z7901 Long term (current) use of anticoagulants: Secondary | ICD-10-CM | POA: Diagnosis not present

## 2023-09-25 DIAGNOSIS — Z953 Presence of xenogenic heart valve: Secondary | ICD-10-CM | POA: Diagnosis not present

## 2023-09-25 DIAGNOSIS — K5521 Angiodysplasia of colon with hemorrhage: Secondary | ICD-10-CM | POA: Diagnosis not present

## 2023-09-25 DIAGNOSIS — E7800 Pure hypercholesterolemia, unspecified: Secondary | ICD-10-CM | POA: Diagnosis present

## 2023-09-25 DIAGNOSIS — C221 Intrahepatic bile duct carcinoma: Secondary | ICD-10-CM | POA: Diagnosis present

## 2023-09-25 DIAGNOSIS — K222 Esophageal obstruction: Secondary | ICD-10-CM | POA: Diagnosis not present

## 2023-09-25 DIAGNOSIS — I480 Paroxysmal atrial fibrillation: Secondary | ICD-10-CM | POA: Diagnosis present

## 2023-09-25 DIAGNOSIS — Z8673 Personal history of transient ischemic attack (TIA), and cerebral infarction without residual deficits: Secondary | ICD-10-CM

## 2023-09-25 DIAGNOSIS — C772 Secondary and unspecified malignant neoplasm of intra-abdominal lymph nodes: Secondary | ICD-10-CM | POA: Diagnosis not present

## 2023-09-25 DIAGNOSIS — Z833 Family history of diabetes mellitus: Secondary | ICD-10-CM | POA: Diagnosis not present

## 2023-09-25 DIAGNOSIS — C786 Secondary malignant neoplasm of retroperitoneum and peritoneum: Secondary | ICD-10-CM | POA: Diagnosis not present

## 2023-09-25 DIAGNOSIS — K921 Melena: Secondary | ICD-10-CM | POA: Diagnosis present

## 2023-09-25 DIAGNOSIS — I350 Nonrheumatic aortic (valve) stenosis: Secondary | ICD-10-CM | POA: Diagnosis present

## 2023-09-25 DIAGNOSIS — E8809 Other disorders of plasma-protein metabolism, not elsewhere classified: Secondary | ICD-10-CM | POA: Diagnosis not present

## 2023-09-25 DIAGNOSIS — Z66 Do not resuscitate: Secondary | ICD-10-CM | POA: Diagnosis not present

## 2023-09-25 DIAGNOSIS — C784 Secondary malignant neoplasm of small intestine: Secondary | ICD-10-CM | POA: Diagnosis not present

## 2023-09-25 DIAGNOSIS — Z85828 Personal history of other malignant neoplasm of skin: Secondary | ICD-10-CM

## 2023-09-25 DIAGNOSIS — I2510 Atherosclerotic heart disease of native coronary artery without angina pectoris: Secondary | ICD-10-CM | POA: Diagnosis present

## 2023-09-25 DIAGNOSIS — C779 Secondary and unspecified malignant neoplasm of lymph node, unspecified: Secondary | ICD-10-CM | POA: Diagnosis present

## 2023-09-25 DIAGNOSIS — Z87891 Personal history of nicotine dependence: Secondary | ICD-10-CM

## 2023-09-25 DIAGNOSIS — C787 Secondary malignant neoplasm of liver and intrahepatic bile duct: Secondary | ICD-10-CM | POA: Diagnosis not present

## 2023-09-25 DIAGNOSIS — K274 Chronic or unspecified peptic ulcer, site unspecified, with hemorrhage: Secondary | ICD-10-CM | POA: Diagnosis not present

## 2023-09-25 DIAGNOSIS — K641 Second degree hemorrhoids: Secondary | ICD-10-CM | POA: Diagnosis present

## 2023-09-25 DIAGNOSIS — K922 Gastrointestinal hemorrhage, unspecified: Secondary | ICD-10-CM | POA: Diagnosis not present

## 2023-09-25 DIAGNOSIS — E876 Hypokalemia: Secondary | ICD-10-CM | POA: Diagnosis not present

## 2023-09-25 DIAGNOSIS — K649 Unspecified hemorrhoids: Secondary | ICD-10-CM | POA: Diagnosis not present

## 2023-09-25 DIAGNOSIS — D500 Iron deficiency anemia secondary to blood loss (chronic): Secondary | ICD-10-CM | POA: Diagnosis not present

## 2023-09-25 DIAGNOSIS — I700 Atherosclerosis of aorta: Secondary | ICD-10-CM | POA: Diagnosis present

## 2023-09-25 DIAGNOSIS — I10 Essential (primary) hypertension: Secondary | ICD-10-CM | POA: Diagnosis not present

## 2023-09-25 DIAGNOSIS — I4891 Unspecified atrial fibrillation: Secondary | ICD-10-CM | POA: Diagnosis not present

## 2023-09-25 DIAGNOSIS — K5900 Constipation, unspecified: Secondary | ICD-10-CM | POA: Diagnosis present

## 2023-09-25 DIAGNOSIS — K219 Gastro-esophageal reflux disease without esophagitis: Secondary | ICD-10-CM | POA: Diagnosis not present

## 2023-09-25 DIAGNOSIS — D62 Acute posthemorrhagic anemia: Principal | ICD-10-CM | POA: Diagnosis present

## 2023-09-25 DIAGNOSIS — K6389 Other specified diseases of intestine: Secondary | ICD-10-CM | POA: Diagnosis not present

## 2023-09-25 DIAGNOSIS — Z9049 Acquired absence of other specified parts of digestive tract: Secondary | ICD-10-CM

## 2023-09-25 DIAGNOSIS — Z801 Family history of malignant neoplasm of trachea, bronchus and lung: Secondary | ICD-10-CM

## 2023-09-25 DIAGNOSIS — Z8619 Personal history of other infectious and parasitic diseases: Secondary | ICD-10-CM

## 2023-09-25 DIAGNOSIS — Z5112 Encounter for antineoplastic immunotherapy: Secondary | ICD-10-CM | POA: Diagnosis not present

## 2023-09-25 DIAGNOSIS — R110 Nausea: Secondary | ICD-10-CM | POA: Diagnosis not present

## 2023-09-25 DIAGNOSIS — C7800 Secondary malignant neoplasm of unspecified lung: Secondary | ICD-10-CM | POA: Diagnosis present

## 2023-09-25 DIAGNOSIS — Z79899 Other long term (current) drug therapy: Secondary | ICD-10-CM

## 2023-09-25 DIAGNOSIS — Z452 Encounter for adjustment and management of vascular access device: Secondary | ICD-10-CM | POA: Diagnosis not present

## 2023-09-25 DIAGNOSIS — I447 Left bundle-branch block, unspecified: Secondary | ICD-10-CM | POA: Diagnosis present

## 2023-09-25 LAB — CBC WITH DIFFERENTIAL (CANCER CENTER ONLY)
Abs Immature Granulocytes: 0.15 K/uL — ABNORMAL HIGH (ref 0.00–0.07)
Basophils Absolute: 0 K/uL (ref 0.0–0.1)
Basophils Relative: 0 %
Eosinophils Absolute: 0 K/uL (ref 0.0–0.5)
Eosinophils Relative: 0 %
HCT: 18.4 % — ABNORMAL LOW (ref 39.0–52.0)
Hemoglobin: 5.7 g/dL — CL (ref 13.0–17.0)
Immature Granulocytes: 1 %
Lymphocytes Relative: 6 %
Lymphs Abs: 1 K/uL (ref 0.7–4.0)
MCH: 27.9 pg (ref 26.0–34.0)
MCHC: 31 g/dL (ref 30.0–36.0)
MCV: 90.2 fL (ref 80.0–100.0)
Monocytes Absolute: 2 K/uL — ABNORMAL HIGH (ref 0.1–1.0)
Monocytes Relative: 12 %
Neutro Abs: 13.5 K/uL — ABNORMAL HIGH (ref 1.7–7.7)
Neutrophils Relative %: 81 %
Platelet Count: 304 K/uL (ref 150–400)
RBC: 2.04 MIL/uL — ABNORMAL LOW (ref 4.22–5.81)
RDW: 15.4 % (ref 11.5–15.5)
WBC Count: 16.7 K/uL — ABNORMAL HIGH (ref 4.0–10.5)
nRBC: 0 % (ref 0.0–0.2)

## 2023-09-25 LAB — CMP (CANCER CENTER ONLY)
ALT: 20 U/L (ref 0–44)
AST: 39 U/L (ref 15–41)
Albumin: 2.2 g/dL — ABNORMAL LOW (ref 3.5–5.0)
Alkaline Phosphatase: 185 U/L — ABNORMAL HIGH (ref 38–126)
Anion gap: 10 (ref 5–15)
BUN: 24 mg/dL — ABNORMAL HIGH (ref 8–23)
CO2: 21 mmol/L — ABNORMAL LOW (ref 22–32)
Calcium: 8.4 mg/dL — ABNORMAL LOW (ref 8.9–10.3)
Chloride: 104 mmol/L (ref 98–111)
Creatinine: 1.22 mg/dL (ref 0.61–1.24)
GFR, Estimated: 60 mL/min — ABNORMAL LOW (ref >60)
Glucose, Bld: 126 mg/dL — ABNORMAL HIGH (ref 70–99)
Potassium: 3.8 mmol/L (ref 3.5–5.1)
Sodium: 135 mmol/L (ref 135–145)
Total Bilirubin: 0.8 mg/dL (ref 0.0–1.2)
Total Protein: 5.7 g/dL — ABNORMAL LOW (ref 6.5–8.1)

## 2023-09-25 LAB — CBC
HCT: 17.3 % — ABNORMAL LOW (ref 39.0–52.0)
HCT: 23.6 % — ABNORMAL LOW (ref 39.0–52.0)
Hemoglobin: 5.6 g/dL — ABNORMAL LOW (ref 13.0–17.0)
Hemoglobin: 7.9 g/dL — ABNORMAL LOW (ref 13.0–17.0)
MCH: 28.9 pg (ref 26.0–34.0)
MCH: 29.2 pg (ref 26.0–34.0)
MCHC: 32.4 g/dL (ref 30.0–36.0)
MCHC: 33.5 g/dL (ref 30.0–36.0)
MCV: 89.2 fL (ref 80.0–100.0)
MCV: 87.1 fL (ref 80.0–100.0)
Platelets: 276 K/uL (ref 150–400)
Platelets: 275 K/uL (ref 150–400)
RBC: 1.94 MIL/uL — ABNORMAL LOW (ref 4.22–5.81)
RBC: 2.71 MIL/uL — ABNORMAL LOW (ref 4.22–5.81)
RDW: 15.6 % — ABNORMAL HIGH (ref 11.5–15.5)
RDW: 15.1 % (ref 11.5–15.5)
WBC: 15.2 K/uL — ABNORMAL HIGH (ref 4.0–10.5)
WBC: 18 K/uL — ABNORMAL HIGH (ref 4.0–10.5)
nRBC: 0 % (ref 0.0–0.2)
nRBC: 0 % (ref 0.0–0.2)

## 2023-09-25 LAB — MAGNESIUM: Magnesium: 1.9 mg/dL (ref 1.7–2.4)

## 2023-09-25 LAB — PREPARE RBC (CROSSMATCH)

## 2023-09-25 LAB — SAMPLE TO BLOOD BANK

## 2023-09-25 MED ORDER — AMLODIPINE BESYLATE 10 MG PO TABS
10.0000 mg | ORAL_TABLET | Freq: Every day | ORAL | Status: DC
  Administered 2023-09-27 – 2023-09-29 (×2): 10 mg via ORAL
  Filled 2023-09-25 (×3): qty 1

## 2023-09-25 MED ORDER — PANTOPRAZOLE SODIUM 40 MG IV SOLR
40.0000 mg | Freq: Two times a day (BID) | INTRAVENOUS | Status: DC
  Administered 2023-09-25 – 2023-09-29 (×9): 40 mg via INTRAVENOUS
  Filled 2023-09-25 (×9): qty 10

## 2023-09-25 MED ORDER — ATORVASTATIN CALCIUM 20 MG PO TABS
80.0000 mg | ORAL_TABLET | Freq: Every day | ORAL | Status: DC
Start: 2023-09-26 — End: 2023-09-29
  Administered 2023-09-27 – 2023-09-29 (×3): 80 mg via ORAL
  Filled 2023-09-25 (×3): qty 4

## 2023-09-25 MED ORDER — PEG 3350-KCL-NA BICARB-NACL 420 G PO SOLR: 2000.0000 mL | Freq: Once | ORAL | Status: DC | PRN

## 2023-09-25 MED ORDER — ONDANSETRON HCL 4 MG/2ML IJ SOLN: 4.0000 mg | Freq: Four times a day (QID) | INTRAMUSCULAR | Status: DC | PRN

## 2023-09-25 MED ORDER — SODIUM CHLORIDE 0.9 % IV SOLN: INTRAVENOUS | Status: DC

## 2023-09-25 MED ORDER — POLYETHYLENE GLYCOL 3350 17 GM/SCOOP PO POWD: 17.0000 g | ORAL | Status: DC

## 2023-09-25 MED ORDER — ACETAMINOPHEN 325 MG PO TABS
650.0000 mg | ORAL_TABLET | Freq: Four times a day (QID) | ORAL | Status: DC | PRN
  Administered 2023-09-25: 650 mg via ORAL
  Filled 2023-09-25: qty 2

## 2023-09-25 MED ORDER — SODIUM CHLORIDE 0.9% IV SOLUTION: Freq: Once | INTRAVENOUS | Status: AC

## 2023-09-25 MED ORDER — ONDANSETRON HCL 4 MG PO TABS: 4.0000 mg | ORAL_TABLET | Freq: Four times a day (QID) | ORAL | Status: DC | PRN

## 2023-09-25 MED ORDER — PEG 3350-KCL-NA BICARB-NACL 420 G PO SOLR
4000.0000 mL | Freq: Once | ORAL | Status: AC
  Administered 2023-09-25: 4000 mL via ORAL
  Filled 2023-09-25: qty 4000

## 2023-09-25 MED ORDER — OXYCODONE HCL 5 MG PO TABS: 5.0000 mg | ORAL_TABLET | Freq: Four times a day (QID) | ORAL | Status: DC | PRN

## 2023-09-25 MED ORDER — ACETAMINOPHEN 650 MG RE SUPP: 650.0000 mg | Freq: Four times a day (QID) | RECTAL | Status: DC | PRN

## 2023-09-25 MED ORDER — CHLORHEXIDINE GLUCONATE CLOTH 2 % EX PADS
6.0000 | MEDICATED_PAD | Freq: Every day | CUTANEOUS | Status: DC
  Administered 2023-09-25 – 2023-09-29 (×5): 6 via TOPICAL

## 2023-09-25 MED ORDER — SODIUM CHLORIDE 0.9% FLUSH: 10.0000 mL | INTRAVENOUS | Status: DC | PRN

## 2023-09-25 MED ORDER — METOPROLOL SUCCINATE ER 25 MG PO TB24
37.5000 mg | ORAL_TABLET | Freq: Every day | ORAL | Status: DC
  Administered 2023-09-27 – 2023-09-29 (×3): 37.5 mg via ORAL
  Filled 2023-09-25 (×3): qty 2

## 2023-09-25 NOTE — Progress Notes (Signed)
 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    INTERVAL HISTORY: Patient returns to clinic today for repeat laboratory work, further evaluation, and reconsideration of cycle 2, day 8 of cisplatin , gemcitabine , and durvalumab .  He continues to have chronic weakness and fatigue.  He continues to complain of dark tarry stools.  He otherwise feels well.  He has no neurologic complaints.  He denies any recent fevers or illnesses.  He has no chest pain, shortness of breath, cough, or hemoptysis.  He does not complain of abdominal pain today.  He denies any nausea, vomiting, constipation, or diarrhea.  He has no melena or hematochezia.  He has no urinary complaints.  Patient offers no further specific complaints today.  REVIEW OF SYSTEMS:   Review of Systems  Constitutional:  Positive for malaise/fatigue. Negative for fever and weight loss.  Respiratory: Negative.  Negative for cough, hemoptysis and shortness of breath.   Cardiovascular: Negative.  Negative for chest pain and leg swelling.  Gastrointestinal: Negative.  Negative for abdominal pain, blood in stool, constipation, diarrhea, melena, nausea and vomiting.  Genitourinary: Negative.  Negative for dysuria.  Musculoskeletal: Negative.  Negative for back pain.  Skin: Negative.  Negative for rash.  Neurological: Negative.  Negative for dizziness, focal weakness, weakness and headaches.   Psychiatric/Behavioral: Negative.  The patient is not nervous/anxious.     As per HPI. Otherwise, a complete review of systems is negative.  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   CAD (coronary artery disease) 09/17/2021   a.) R/LHC 09/17/2021: 30% pRCA, 40% dRCA, 50% pLCx, 30% dLCx, 50% pLAD, 50% mLAD, 50% dLAD --> med mgmt   Cardiac murmur    Cervical spondylosis    Cholelithiasis    CRAO (central retinal artery occlusion), 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   CVA (cerebral vascular accident) (HCC) 03/13/2018   a.) MRI brain 03/14/2018 --> acute infarct in the posterior RIGHT frontal lobe; small chronic cerebellar infarct --> Tx'd with TPA with (+) improvement/resolution of symptoms   Diastolic dysfunction    a.) TTE 01/31/2015: EF 55-60%, MAC, G1DD; b.) TTE 03/14/2018: EF 60-65%, mild LAE, sev MAC, sev AoV cal with mod AS (MPG 14) G2DD; c.) TTE 09/13/2019: EF 55-60%, mild LAE, mild-mod MR, mild AS (MPG 12.5), G2DD; d.) TTE 08/16/2021: EF 60-65%, mild LVH, mod MAC, mild MR, mod AS (MPG 18.5), G1DD; e.) TTE 12/05/2021: EF 60-65%, mod MAC, s/p TAVR (well seated/functioning valve), mild MR, G1DD   GERD (gastroesophageal reflux disease)    History of chicken pox    History of measles    Hyperlipidemia    Hypertension    LBBB (left bundle branch block)    Long term current use  of anticoagulant    a.) apixaban    Low back pain radiating to left lower extremity    PAF (paroxysmal atrial fibrillation) (HCC)    a.) CHA2DS2VASc = 6 (age x2, HTN, CVA x2, vascular disease history);  b.) rate/rhythm maintained on oral metoprolol  succinate; chronically anticoagulated with apixaban    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   a.) 26mm S3UR via RIGHT TF approach   Skin cancer     PAST SURGICAL HISTORY: Past Surgical History:  Procedure Laterality Date   INTRAOPERATIVE TRANSTHORACIC ECHOCARDIOGRAM N/A 11/06/2021   Procedure: INTRAOPERATIVE TRANSTHORACIC ECHOCARDIOGRAM;  Surgeon: Jon Lonni BIRCH, MD;  Location: [facility] OR;  Service: Open Heart Surgery;  Laterality: N/A;   IR IMAGING GUIDED PORT INSERTION  08/07/2023   RIGHT/LEFT HEART CATH AND CORONARY ANGIOGRAPHY N/A 09/17/2021   Procedure: RIGHT/LEFT HEART CATH AND CORONARY ANGIOGRAPHY;  Surgeon: Jon Lonni BIRCH, MD;  Location: [facility] INVASIVE CV LAB;  Service: Cardiovascular;  Laterality: N/A;   ROBOTIC ASSISTED LAPAROSCOPIC CHOLECYSTECTOMY  07/24/2022   SKIN CANCER EXCISION  07/2020   TRANSCATHETER AORTIC VALVE REPLACEMENT, TRANSFEMORAL N/A 11/06/2021   Procedure: Transcatheter Aortic Valve Replacement, Transfemoral;  Surgeon: Jon Lonni BIRCH, MD;  Location: [facility] OR;  Service: Open Heart Surgery;  Laterality: N/A;    FAMILY HISTORY: Family History  Problem Relation Age of Onset   Arrhythmia Mother        A-Fib   Lung cancer Father    Diabetes Son    Colon cancer Neg Hx    Prostate cancer Neg Hx     ADVANCED DIRECTIVES (Y/N):  N  HEALTH MAINTENANCE: Social History   Tobacco Use   Smoking status: Former    Current packs/day: 0.00    Average packs/day: 1 pack/day for 20.0 years (20.0 ttl pk-yrs)    Types: Cigarettes    Start date: 03/12/1963    Quit date: 03/12/1983    Years since quitting: 40.5    Passive exposure: Past   Smokeless tobacco: Never  Vaping Use   Vaping status: Never Used  Substance Use Topics   Alcohol use: No   Drug use: No     Colonoscopy:  PAP:  Bone density:  Lipid panel:  No Known Allergies  Current Outpatient Medications  Medication Sig Dispense Refill   amLODipine  (NORVASC ) 10 MG tablet Take 1 tablet (10 mg total) by mouth daily. 90 tablet 3    amoxicillin -clavulanate (AUGMENTIN ) 875-125 MG tablet Take 1 tablet by mouth 2 (two) times daily. 10 tablet 0   apixaban  (ELIQUIS ) 5 MG TABS tablet TAKE ONE (1) TABLET BY MOUTH TWO TIMES PER DAYTAKE ONE (1) TABLET BY MOUTH TWO TIMES PER DAY, may resume on Friday AM, June 28. 180 tablet 1   atorvastatin  (LIPITOR) 80 MG tablet TAKE ONE TABLET BY MOUTH ONCE DAILY 30 tablet 3   Cyanocobalamin (B-12) 3000 MCG CAPS Take 3,000 mcg by mouth daily as needed (energy).     ibuprofen  (ADVIL ) 800 MG tablet Take 1 tablet (800 mg total) by mouth every 8 (eight) hours as needed. 30 tablet 0   lidocaine -prilocaine  (EMLA ) cream Apply to affected area once 30 g 3   metoprolol  succinate (TOPROL -XL) 25 MG 24 hr tablet TAKE ONE AND ONE-HALF TABLETS DAILY 45 tablet 3   ondansetron  (ZOFRAN ) 4 MG tablet Take 1 tablet (4 mg total) by mouth every 8 (eight) hours as needed. 15 tablet 0   ondansetron  (ZOFRAN ) 8 MG tablet Take 1 tablet (8 mg total) by mouth every 8 (eight)  hours as needed for nausea or vomiting. Start on the third day after cisplatin . 60 tablet 2   oxyCODONE  (ROXICODONE ) 5 MG immediate release tablet Take 1 tablet (5 mg total) by mouth every 6 (six) hours as needed. 90 tablet 0   polyethylene glycol powder (GLYCOLAX /MIRALAX ) 17 GM/SCOOP powder Take 17 g by mouth as directed. 510 g 3   prochlorperazine  (COMPAZINE ) 10 MG tablet Take 1 tablet (10 mg total) by mouth every 6 (six) hours as needed (Nausea or vomiting). 60 tablet 2   senna (SENOKOT) 8.6 MG TABS tablet Take 2 tablets (17.2 mg total) by mouth 2 (two) times daily as needed for mild constipation or moderate constipation. 120 tablet 3   traZODone  (DESYREL ) 50 MG tablet TAKE 1 TABLET BY MOUTH AT BEDTIME AS NEEDED FOR SLEEP 90 tablet 0   amoxicillin  (AMOXIL ) 500 MG tablet Take 4 tablets (2,000 mg total) by mouth as directed. 1 hour prior to dental work including cleanings (Patient not taking: Reported on 09/25/2023) 12 tablet 12   No current  facility-administered medications for this visit.    OBJECTIVE: Vitals:   09/25/23 0910  BP: 95/81  Pulse: (!) 106  Resp: 16  Temp: 98.7 F (37.1 C)  SpO2: 99%     Body mass index is 25.29 kg/m.    ECOG FS:1 - Symptomatic but completely ambulatory  General: Well-developed, well-nourished, no acute distress. Eyes: Pink conjunctiva, anicteric sclera. HEENT: Normocephalic, moist mucous membranes. Lungs: No audible wheezing or coughing. Heart: Regular rate and rhythm. Abdomen: Soft, nontender, no obvious distention. Musculoskeletal: No edema, cyanosis, or clubbing. Neuro: Alert, answering all questions appropriately. Cranial nerves grossly intact. Skin: No rashes or petechiae noted. Psych: Normal affect.  LAB RESULTS:  Lab Results  Component Value Date   NA 134 (L) 09/16/2023   K 3.6 09/16/2023   CL 105 09/16/2023   CO2 22 09/16/2023   GLUCOSE 116 (H) 09/16/2023   BUN 22 09/16/2023   CREATININE 1.06 09/16/2023   CALCIUM  8.2 (L) 09/16/2023   PROT 5.5 (L) 09/16/2023   ALBUMIN 2.3 (L) 09/16/2023   AST 35 09/16/2023   ALT 21 09/16/2023   ALKPHOS 182 (H) 09/16/2023   BILITOT 0.6 09/16/2023   GFRNONAA >60 09/16/2023   GFRAA 81 08/25/2019    Lab Results  Component Value Date   WBC 13.8 (H) 09/16/2023   NEUTROABS 11.5 (H) 09/16/2023   HGB 5.9 (LL) 09/16/2023   HCT 18.2 (L) 09/16/2023   MCV 89.2 09/16/2023   PLT 221 09/16/2023     STUDIES: No results found.   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  PET scan on July 15, 2023 revealed greater than 30 liver lesions as well as carcinomatosis.  Subsequent biopsy revealed a poorly differentiated carcinoma consistent with either upper GI or  pancreatobiliary origin.  Tumor markers are negative.  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  Patient is receiving cisplatin , gemcitabine , and durvalumab  on day 1 with cisplatin  and gemcitabine  only on day 8 followed by G-CSF support.  This is a 21-day cycle.  Patient has had port placed.  Both gemcitabine  and cisplatin  have now been dose reduced secondary to persistent  anemia.  Will hold treatment once again secondary to active GI bleed and profound anemia.  I have recommended admission to the hospital to undergo luminal evaluation and possible nuclear med bleeding scan.  He has been instructed to keep his follow-up appointment next week for further evaluation.   Abdominal pain: Patient does not complain of this today.  Continue oxycodone  as prescribed. Anemia: Hemoglobin declined to 5.7 despite multiple units of packed red blood cells.  Patient also reports dark tarry stool suggesting active GI bleed.  I have recommended admission to the hospital for urgent luminal evaluation and consideration of bleeding scan if needed.  Chemotherapy will remain on hold until his active GI bleed can be addressed. Leukocytosis: Likely reactive, monitor.  I spent a total of 30 minutes reviewing chart data, face-to-face evaluation with the patient, counseling and coordination of care as detailed above.   Patient expressed understanding and was in agreement with this plan. He also understands that He can call clinic at any time with any questions, concerns, or complaints.    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

## 2023-09-25 NOTE — Progress Notes (Signed)
   09/25/23 1545  Assess: MEWS Score  Temp 98.3 F (36.8 C)  BP 116/73  MAP (mmHg) 87  Pulse Rate 72  Resp (!) 28  Assess: MEWS Score  MEWS Temp 0  MEWS Systolic 0  MEWS Pulse 0  MEWS RR 2  MEWS LOC 0  MEWS Score 2  MEWS Score Color Yellow  Assess: if the MEWS score is Yellow or Red  Were vital signs accurate and taken at a resting state? Yes  Does the patient meet 2 or more of the SIRS criteria? No  MEWS guidelines implemented  Yes, yellow  Treat  MEWS Interventions Considered administering scheduled or prn medications/treatments as ordered  Take Vital Signs  Increase Vital Sign Frequency  Yellow: Q2hr x1, continue Q4hrs until patient remains green for 12hrs  Escalate  MEWS: Escalate Yellow: Discuss with charge nurse and consider notifying provider and/or RRT  Notify: Charge Nurse/RN  Name of Charge Nurse/RN Notified Health visitor  Provider Notification  Provider Name/Title Dr Roann  Date Provider Notified 09/25/23  Time Provider Notified 1550  Method of Notification  (secure chat)  Notification Reason Other (Comment) (high RR)  Provider response No new orders  Date of Provider Response 09/25/23  Time of Provider Response 1556  Assess: SIRS CRITERIA  SIRS Temperature  0  SIRS Respirations  1  SIRS Pulse 0  SIRS WBC 1  SIRS Score Sum  2

## 2023-09-25 NOTE — Plan of Care (Signed)

## 2023-09-25 NOTE — Consult Note (Signed)
 Inpatient Consultation   Patient ID: Jon Mejia is a 83 y.o. male.  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  GI is consulted for GI bleeding  Patient has had ongoing melena and some bright red blood per rectum over the last several weeks.  He is on Eliquis , but last dose was yesterday morning. He has been receiving transfusions over the last several weeks through his cancer center to help address and his anemia.  Today he was found to have a hemoglobin of 5.8 and sent to the hospital.  Patient is accompanied by his daughter and wife at bedside who help provide history. He denies any abdominal pain, but does have some discomfort at times.  Reflux without dysphagia and odynophagia.  No chest pain.  Has been weak with diminished appetite and weight decline.  Some nausea with his systemic chemotherapy as well.  No vomiting/hematemesis/coffee-ground emesis.  Last dose of chemotherapy was 2 weeks ago per patient  Has alternating diarrhea and constipation per his wife.  He takes Imodium for his diarrhea which helps resolve it after a couple days.  Denies ppm, icd, hip replacements. S/p chole  Eliquis - last dose morning 8/27 Denies NSAIDs, Anti-plt agents Denies family history of gastrointestinal disease and malignancy Previous Endoscopies: 2011- [facility]   Past Medical History:  Diagnosis Date   Angina pectoris (HCC)    Aortic atherosclerosis (HCC)    Aortic stenosis    a.) TTE 03/14/2018: mod AS (MPG 14); b.) TTE  09/13/2019: mild AS (MPG 12.5); c.) TTE 08/16/2021: mod AS (MPG 18.5); d.) R/LHC 09/17/2021: mRA 3, mPA 19, mPCWP 11, LVEDP 15, CO 5.22, CI 2.75; e.) s/p TAVR 11/06/2021   CAD (coronary artery disease) 09/17/2021   a.) R/LHC 09/17/2021: 30% pRCA, 40% dRCA, 50% pLCx, 30% dLCx, 50% pLAD, 50% mLAD, 50% dLAD --> med mgmt   Cardiac murmur    Cervical spondylosis    Cholelithiasis    CRAO (central retinal artery occlusion), 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   CVA (cerebral vascular accident) (HCC) 03/13/2018   a.) MRI brain 03/14/2018 --> acute infarct in the posterior RIGHT frontal lobe; small chronic cerebellar infarct --> Tx'd with TPA with (+) improvement/resolution of symptoms   Diastolic dysfunction    a.) TTE 01/31/2015: EF 55-60%, MAC, G1DD; b.) TTE 03/14/2018: EF 60-65%, mild LAE, sev MAC, sev AoV cal with mod AS (MPG 14) G2DD; c.) TTE 09/13/2019: EF 55-60%, mild LAE, mild-mod MR, mild AS (MPG 12.5), G2DD; d.) TTE 08/16/2021: EF 60-65%, mild LVH, mod MAC, mild MR, mod AS (MPG 18.5), G1DD; e.) TTE 12/05/2021: EF 60-65%, mod MAC, s/p TAVR (well seated/functioning valve), mild MR, G1DD   GERD (gastroesophageal reflux disease)    History of chicken pox    History of measles    Hyperlipidemia    Hypertension    LBBB (left bundle branch block)    Long term current use of anticoagulant    a.) apixaban    Low back pain radiating to left lower extremity    PAF (paroxysmal atrial fibrillation) (HCC)    a.) CHA2DS2VASc = 6 (age x2,  HTN, CVA x2, vascular disease history);  b.) rate/rhythm maintained on oral metoprolol  succinate; chronically anticoagulated with apixaban    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   a.) 26mm S3UR via RIGHT TF approach   Skin cancer     Past Surgical History:  Procedure Laterality Date   INTRAOPERATIVE TRANSTHORACIC ECHOCARDIOGRAM N/A 11/06/2021   Procedure: INTRAOPERATIVE TRANSTHORACIC ECHOCARDIOGRAM;  Surgeon: Verlin Lonni BIRCH, MD;  Location: [facility] OR;   Service: Open Heart Surgery;  Laterality: N/A;   IR IMAGING GUIDED PORT INSERTION  08/07/2023   RIGHT/LEFT HEART CATH AND CORONARY ANGIOGRAPHY N/A 09/17/2021   Procedure: RIGHT/LEFT HEART CATH AND CORONARY ANGIOGRAPHY;  Surgeon: Verlin Lonni BIRCH, MD;  Location: [facility] INVASIVE CV LAB;  Service: Cardiovascular;  Laterality: N/A;   ROBOTIC ASSISTED LAPAROSCOPIC CHOLECYSTECTOMY  07/24/2022   SKIN CANCER EXCISION  07/2020   TRANSCATHETER AORTIC VALVE REPLACEMENT, TRANSFEMORAL N/A 11/06/2021   Procedure: Transcatheter Aortic Valve Replacement, Transfemoral;  Surgeon: Verlin Lonni BIRCH, MD;  Location: [facility] OR;  Service: Open Heart Surgery;  Laterality: N/A;    No Known Allergies  Family History  Problem Relation Age of Onset   Arrhythmia Mother        A-Fib   Lung cancer Father    Diabetes Son    Colon cancer Neg Hx    Prostate cancer Neg Hx     Social History   Tobacco Use   Smoking status: Former    Current packs/day: 0.00    Average packs/day: 1 pack/day for 20.0 years (20.0 ttl pk-yrs)    Types: Cigarettes    Start date: 03/12/1963    Quit date: 03/12/1983    Years since quitting: 40.5    Passive exposure: Past   Smokeless tobacco: Never  Vaping Use   Vaping status: Never Used  Substance Use Topics   Alcohol use: No   Drug use: No     Pertinent GI related history and allergies were reviewed with the patient  Review of Systems  Constitutional:  Positive for activity change, appetite change and fatigue. Negative for chills, diaphoresis, fever and unexpected weight change.  HENT:  Negative for trouble swallowing and voice change.   Respiratory:  Negative for shortness of breath and wheezing.   Cardiovascular:  Negative for chest pain, palpitations and leg swelling.  Gastrointestinal:  Positive for blood in stool (melena). Negative for abdominal distention, abdominal pain, anal bleeding, constipation, diarrhea, nausea, rectal pain and vomiting.  Musculoskeletal:   Negative for arthralgias and myalgias.  Skin:  Positive for pallor. Negative for color change.  Neurological:  Positive for weakness. Negative for dizziness and syncope.  Psychiatric/Behavioral:  Negative for confusion. The patient is not nervous/anxious.   All other systems reviewed and are negative.    Medications Home Medications No current facility-administered medications on file prior to encounter.   Current Outpatient Medications on File Prior to Encounter  Medication Sig Dispense Refill   lidocaine -prilocaine  (EMLA ) cream Apply to affected area once 30 g 3   ondansetron  (ZOFRAN ) 8 MG tablet Take 1 tablet (8 mg total) by mouth every 8 (eight) hours as needed for nausea or vomiting. Start on the third day after cisplatin . 60 tablet 2   polyethylene glycol powder (GLYCOLAX /MIRALAX ) 17 GM/SCOOP powder Take 17 g by mouth as directed. 510 g 3   prochlorperazine  (COMPAZINE ) 10 MG tablet Take 1 tablet (10 mg total) by mouth every 6 (six) hours as needed (Nausea or vomiting). 60 tablet 2   senna (SENOKOT) 8.6 MG TABS tablet  Take 2 tablets (17.2 mg total) by mouth 2 (two) times daily as needed for mild constipation or moderate constipation. 120 tablet 3   amLODipine  (NORVASC ) 10 MG tablet Take 1 tablet (10 mg total) by mouth daily. 90 tablet 3   amoxicillin  (AMOXIL ) 500 MG tablet Take 4 tablets (2,000 mg total) by mouth as directed. 1 hour prior to dental work including cleanings (Patient not taking: Reported on 09/25/2023) 12 tablet 12   apixaban  (ELIQUIS ) 5 MG TABS tablet TAKE ONE (1) TABLET BY MOUTH TWO TIMES PER DAYTAKE ONE (1) TABLET BY MOUTH TWO TIMES PER DAY, may resume on Friday AM, June 28. 180 tablet 1   atorvastatin  (LIPITOR) 80 MG tablet TAKE ONE TABLET BY MOUTH ONCE DAILY 30 tablet 3   Cyanocobalamin (B-12) 3000 MCG CAPS Take 3,000 mcg by mouth daily as needed (energy).     metoprolol  succinate (TOPROL -XL) 25 MG 24 hr tablet TAKE ONE AND ONE-HALF TABLETS DAILY 45 tablet 3    oxyCODONE  (ROXICODONE ) 5 MG immediate release tablet Take 1 tablet (5 mg total) by mouth every 6 (six) hours as needed. 90 tablet 0   traZODone  (DESYREL ) 50 MG tablet TAKE 1 TABLET BY MOUTH AT BEDTIME AS NEEDED FOR SLEEP 90 tablet 0   Pertinent GI related medications were reviewed with the patient  Inpatient Medications  Current Facility-Administered Medications:    0.9 %  sodium chloride  infusion (Manually program via Guardrails IV Fluids), , Intravenous, Once, Paudel, Keshab, MD   0.9 %  sodium chloride  infusion, , Intravenous, Continuous, Paudel, Keshab, MD   acetaminophen  (TYLENOL ) tablet 650 mg, 650 mg, Oral, Q6H PRN **OR** acetaminophen  (TYLENOL ) suppository 650 mg, 650 mg, Rectal, Q6H PRN, Paudel, Nena, MD   [START ON 09/26/2023] amLODipine  (NORVASC ) tablet 10 mg, 10 mg, Oral, Daily, Paudel, Keshab, MD   [START ON 09/26/2023] atorvastatin  (LIPITOR) tablet 80 mg, 80 mg, Oral, Daily, Paudel, Keshab, MD   Chlorhexidine  Gluconate Cloth 2 % PADS 6 each, 6 each, Topical, Daily, Paudel, Keshab, MD, 6 each at 09/25/23 1517   [START ON 09/26/2023] metoprolol  succinate (TOPROL -XL) 24 hr tablet 37.5 mg, 37.5 mg, Oral, Daily, Paudel, Keshab, MD   ondansetron  (ZOFRAN ) tablet 4 mg, 4 mg, Oral, Q6H PRN **OR** ondansetron  (ZOFRAN ) injection 4 mg, 4 mg, Intravenous, Q6H PRN, Paudel, Keshab, MD   oxyCODONE  (Oxy IR/ROXICODONE ) immediate release tablet 5 mg, 5 mg, Oral, Q6H PRN, Paudel, Keshab, MD   pantoprazole  (PROTONIX ) injection 40 mg, 40 mg, Intravenous, Q12H, Paudel, Keshab, MD, 40 mg at 09/25/23 1438   sodium chloride  flush (NS) 0.9 % injection 10-40 mL, 10-40 mL, Intracatheter, PRN, Paudel, Keshab, MD  sodium chloride       acetaminophen  **OR** acetaminophen , ondansetron  **OR** ondansetron  (ZOFRAN ) IV, oxyCODONE , sodium chloride  flush   Objective   Vitals:   09/25/23 1343 09/25/23 1355 09/25/23 1526  BP:  111/66 (!) 101/57  Pulse:  83 74  Resp:  18 18  Temp:  98.4 F (36.9 C) 98.2 F (36.8  C)  TempSrc:  Oral Oral  SpO2:  99% 97%  Weight: 68.9 kg    Height: 5' 5 (1.651 m)       Physical Exam Vitals and nursing note reviewed.  Constitutional:      General: He is not in acute distress.    Appearance: He is ill-appearing. He is not toxic-appearing or diaphoretic.     Comments: Frail appearing  HENT:     Head: Normocephalic and atraumatic.     Nose: Nose normal.  Mouth/Throat:     Mouth: Mucous membranes are moist.     Pharynx: Oropharynx is clear.  Eyes:     General: No scleral icterus.    Extraocular Movements: Extraocular movements intact.  Cardiovascular:     Rate and Rhythm: Normal rate. Rhythm irregular.     Heart sounds: Normal heart sounds. No murmur heard.    No friction rub. No gallop.  Pulmonary:     Effort: Pulmonary effort is normal. No respiratory distress.     Breath sounds: Normal breath sounds. No wheezing, rhonchi or rales.  Abdominal:     General: Bowel sounds are normal. There is distension.     Palpations: Abdomen is soft.     Tenderness: There is no abdominal tenderness. There is no guarding or rebound.  Musculoskeletal:     Cervical back: Neck supple.     Right lower leg: Edema present.     Left lower leg: Edema present.  Skin:    General: Skin is warm and dry.     Coloration: Skin is pale. Skin is not jaundiced.  Neurological:     General: No focal deficit present.     Mental Status: He is alert and oriented to person, place, and time. Mental status is at baseline.  Psychiatric:        Mood and Affect: Mood normal.        Behavior: Behavior normal.        Thought Content: Thought content normal.        Judgment: Judgment normal.     Laboratory Data Recent Labs  Lab 09/25/23 0855 09/25/23 1343  WBC 16.7* 15.2*  HGB 5.7* 5.6*  HCT 18.4* 17.3*  PLT 304 276   Recent Labs  Lab 09/25/23 0855  NA 135  K 3.8  CL 104  CO2 21*  BUN 24*  CALCIUM  8.4*  PROT 5.7*  BILITOT 0.8  ALKPHOS 185*  ALT 20  AST 39  GLUCOSE  126*    Imaging Studies: Reviewed June 2025 CT/PET scan reads  Assessment:   # Gastrointestinal bleeding - Patient reports melena and hematochezia - On Eliquis  for A-fib and history of stroke, history of TAVR  # Acute blood loss anemia - In addition to anemia contributed by systemic chemotherapy, it appears patient has acute blood loss anemia as well - Suspect bleeding has been ongoing for several weeks as he has noted melena during this time. - He has been receiving transfusions through his cancer center.  He is scheduled to receive 2 units today at the hospital  #Metastatic cholangiocarcinoma on systemic chemotherapy #Peritoneal carcinomatosis  # leukocytosis # hypoalbuminemia # elevated alk phos  #Status post cholecystectomy  #GERD  #A-fib # HTN  Plan:  Esophagogastroduodenoscopy and Colonoscopy planned for tomorrow pending patient stability and endoscopy suite availability Go lytely prep ordered. If stools not clear after 1st gallon, start second gallon (ordered prn)  NPO at midnight. OK to drink golytely  prep after midnight. Can add flavorings- avoid red/purple Clear liquids now Labs in am- bmp, cbc, inr Protonix  40 mg iv q12 h Hold dvt ppx Hold Eliquis  Monitor H&H.  Transfusion and resuscitation as per primary team. Currently scheduled to receive two units prbc Hemoglobin will need to be above 7 prior to endoscopy and sedation Avoid frequent lab draws to prevent lab induced anemia Supportive care and antiemetics as per primary team Maintain two sites IV access Avoid nsaids Monitor for GIB. Reviewed previous imaging and endoscopy records  Should patient instability (  worsening tachycardia or hypotension) occur (relating to bleeding) recommend stat CTA to evaluate for source of bleeding and possible IR embolization  D/w patient and family, if egd and colonoscopy do not reveal source, may perform video capsule endoscopy  Esophagogastroduodenoscopy and  Colonoscopy with possible biopsy, control of bleeding, polypectomy, and interventions as necessary has been discussed with the patient/patient representative. Informed consent was obtained from the patient/patient representative after explaining the indication, nature, and risks of the procedure including but not limited to death, bleeding, perforation, missed neoplasm/lesions, cardiorespiratory compromise, and reaction to medications. Opportunity for questions was given and appropriate answers were provided. Patient/patient representative has verbalized understanding is amenable to undergoing the procedure.  Informed consent was obtained from the patient/patient representative after explaining the indication, nature, risks of the procedure, and alternatives of Givens capsule including but not limited to the rare risk of perforation or Given's capsule becoming lodged in the GI tract requiring surgical removal.  Opportunity for questions was given and appropriate answers were provided. Patient/patient representative has verbalized understanding is amenable to undergoing the procedure.  Management of other medical comorbidities as per primary team  I personally performed the service.  Thank you for allowing us  to participate in this patient's care. Please don't hesitate to call if any questions or concerns arise.   Elspeth Ozell Jungling, DO [facility]  Portions of the record may have been created with voice recognition software. Occasional wrong-word or 'sound-a-like' substitutions may have occurred due to the inherent limitations of voice recognition software.  Read the chart carefully and recognize, using context, where substitutions may have occurred.

## 2023-09-25 NOTE — Progress Notes (Signed)
 Stools are dark, and he passed a little bit of blood yesterday. He is really weak, his appetite isn't good. He is pale. His fingers are getting like his toes they are tingling. He is gettin

## 2023-09-25 NOTE — H&P (View-Only) (Signed)
 Inpatient Consultation   Patient ID: Jon Mejia is a 83 y.o. male.  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  GI is consulted for GI bleeding  Patient has had ongoing melena and some bright red blood per rectum over the last several weeks.  He is on Eliquis , but last dose was yesterday morning. He has been receiving transfusions over the last several weeks through his cancer center to help address and his anemia.  Today he was found to have a hemoglobin of 5.8 and sent to the hospital.  Patient is accompanied by his daughter and wife at bedside who help provide history. He denies any abdominal pain, but does have some discomfort at times.  Reflux without dysphagia and odynophagia.  No chest pain.  Has been weak with diminished appetite and weight decline.  Some nausea with his systemic chemotherapy as well.  No vomiting/hematemesis/coffee-ground emesis.  Last dose of chemotherapy was 2 weeks ago per patient  Has alternating diarrhea and constipation per his wife.  He takes Imodium for his diarrhea which helps resolve it after a couple days.  Denies ppm, icd, hip replacements. S/p chole  Eliquis - last dose morning 8/27 Denies NSAIDs, Anti-plt agents Denies family history of gastrointestinal disease and malignancy Previous Endoscopies: 2011- [facility]   Past Medical History:  Diagnosis Date   Angina pectoris (HCC)    Aortic atherosclerosis (HCC)    Aortic stenosis    a.) TTE 03/14/2018: mod AS (MPG 14); b.) TTE  09/13/2019: mild AS (MPG 12.5); c.) TTE 08/16/2021: mod AS (MPG 18.5); d.) R/LHC 09/17/2021: mRA 3, mPA 19, mPCWP 11, LVEDP 15, CO 5.22, CI 2.75; e.) s/p TAVR 11/06/2021   CAD (coronary artery disease) 09/17/2021   a.) R/LHC 09/17/2021: 30% pRCA, 40% dRCA, 50% pLCx, 30% dLCx, 50% pLAD, 50% mLAD, 50% dLAD --> med mgmt   Cardiac murmur    Cervical spondylosis    Cholelithiasis    CRAO (central retinal artery occlusion), 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   CVA (cerebral vascular accident) (HCC) 03/13/2018   a.) MRI brain 03/14/2018 --> acute infarct in the posterior RIGHT frontal lobe; small chronic cerebellar infarct --> Tx'd with TPA with (+) improvement/resolution of symptoms   Diastolic dysfunction    a.) TTE 01/31/2015: EF 55-60%, MAC, G1DD; b.) TTE 03/14/2018: EF 60-65%, mild LAE, sev MAC, sev AoV cal with mod AS (MPG 14) G2DD; c.) TTE 09/13/2019: EF 55-60%, mild LAE, mild-mod MR, mild AS (MPG 12.5), G2DD; d.) TTE 08/16/2021: EF 60-65%, mild LVH, mod MAC, mild MR, mod AS (MPG 18.5), G1DD; e.) TTE 12/05/2021: EF 60-65%, mod MAC, s/p TAVR (well seated/functioning valve), mild MR, G1DD   GERD (gastroesophageal reflux disease)    History of chicken pox    History of measles    Hyperlipidemia    Hypertension    LBBB (left bundle branch block)    Long term current use of anticoagulant    a.) apixaban    Low back pain radiating to left lower extremity    PAF (paroxysmal atrial fibrillation) (HCC)    a.) CHA2DS2VASc = 6 (age x2,  HTN, CVA x2, vascular disease history);  b.) rate/rhythm maintained on oral metoprolol  succinate; chronically anticoagulated with apixaban    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   a.) 26mm S3UR via RIGHT TF approach   Skin cancer     Past Surgical History:  Procedure Laterality Date   INTRAOPERATIVE TRANSTHORACIC ECHOCARDIOGRAM N/A 11/06/2021   Procedure: INTRAOPERATIVE TRANSTHORACIC ECHOCARDIOGRAM;  Surgeon: Verlin Lonni BIRCH, MD;  Location: [facility] OR;   Service: Open Heart Surgery;  Laterality: N/A;   IR IMAGING GUIDED PORT INSERTION  08/07/2023   RIGHT/LEFT HEART CATH AND CORONARY ANGIOGRAPHY N/A 09/17/2021   Procedure: RIGHT/LEFT HEART CATH AND CORONARY ANGIOGRAPHY;  Surgeon: Verlin Lonni BIRCH, MD;  Location: [facility] INVASIVE CV LAB;  Service: Cardiovascular;  Laterality: N/A;   ROBOTIC ASSISTED LAPAROSCOPIC CHOLECYSTECTOMY  07/24/2022   SKIN CANCER EXCISION  07/2020   TRANSCATHETER AORTIC VALVE REPLACEMENT, TRANSFEMORAL N/A 11/06/2021   Procedure: Transcatheter Aortic Valve Replacement, Transfemoral;  Surgeon: Verlin Lonni BIRCH, MD;  Location: [facility] OR;  Service: Open Heart Surgery;  Laterality: N/A;    No Known Allergies  Family History  Problem Relation Age of Onset   Arrhythmia Mother        A-Fib   Lung cancer Father    Diabetes Son    Colon cancer Neg Hx    Prostate cancer Neg Hx     Social History   Tobacco Use   Smoking status: Former    Current packs/day: 0.00    Average packs/day: 1 pack/day for 20.0 years (20.0 ttl pk-yrs)    Types: Cigarettes    Start date: 03/12/1963    Quit date: 03/12/1983    Years since quitting: 40.5    Passive exposure: Past   Smokeless tobacco: Never  Vaping Use   Vaping status: Never Used  Substance Use Topics   Alcohol use: No   Drug use: No     Pertinent GI related history and allergies were reviewed with the patient  Review of Systems  Constitutional:  Positive for activity change, appetite change and fatigue. Negative for chills, diaphoresis, fever and unexpected weight change.  HENT:  Negative for trouble swallowing and voice change.   Respiratory:  Negative for shortness of breath and wheezing.   Cardiovascular:  Negative for chest pain, palpitations and leg swelling.  Gastrointestinal:  Positive for blood in stool (melena). Negative for abdominal distention, abdominal pain, anal bleeding, constipation, diarrhea, nausea, rectal pain and vomiting.  Musculoskeletal:   Negative for arthralgias and myalgias.  Skin:  Positive for pallor. Negative for color change.  Neurological:  Positive for weakness. Negative for dizziness and syncope.  Psychiatric/Behavioral:  Negative for confusion. The patient is not nervous/anxious.   All other systems reviewed and are negative.    Medications Home Medications No current facility-administered medications on file prior to encounter.   Current Outpatient Medications on File Prior to Encounter  Medication Sig Dispense Refill   lidocaine -prilocaine  (EMLA ) cream Apply to affected area once 30 g 3   ondansetron  (ZOFRAN ) 8 MG tablet Take 1 tablet (8 mg total) by mouth every 8 (eight) hours as needed for nausea or vomiting. Start on the third day after cisplatin . 60 tablet 2   polyethylene glycol powder (GLYCOLAX /MIRALAX ) 17 GM/SCOOP powder Take 17 g by mouth as directed. 510 g 3   prochlorperazine  (COMPAZINE ) 10 MG tablet Take 1 tablet (10 mg total) by mouth every 6 (six) hours as needed (Nausea or vomiting). 60 tablet 2   senna (SENOKOT) 8.6 MG TABS tablet  Take 2 tablets (17.2 mg total) by mouth 2 (two) times daily as needed for mild constipation or moderate constipation. 120 tablet 3   amLODipine  (NORVASC ) 10 MG tablet Take 1 tablet (10 mg total) by mouth daily. 90 tablet 3   amoxicillin  (AMOXIL ) 500 MG tablet Take 4 tablets (2,000 mg total) by mouth as directed. 1 hour prior to dental work including cleanings (Patient not taking: Reported on 09/25/2023) 12 tablet 12   apixaban  (ELIQUIS ) 5 MG TABS tablet TAKE ONE (1) TABLET BY MOUTH TWO TIMES PER DAYTAKE ONE (1) TABLET BY MOUTH TWO TIMES PER DAY, may resume on Friday AM, June 28. 180 tablet 1   atorvastatin  (LIPITOR) 80 MG tablet TAKE ONE TABLET BY MOUTH ONCE DAILY 30 tablet 3   Cyanocobalamin (B-12) 3000 MCG CAPS Take 3,000 mcg by mouth daily as needed (energy).     metoprolol  succinate (TOPROL -XL) 25 MG 24 hr tablet TAKE ONE AND ONE-HALF TABLETS DAILY 45 tablet 3    oxyCODONE  (ROXICODONE ) 5 MG immediate release tablet Take 1 tablet (5 mg total) by mouth every 6 (six) hours as needed. 90 tablet 0   traZODone  (DESYREL ) 50 MG tablet TAKE 1 TABLET BY MOUTH AT BEDTIME AS NEEDED FOR SLEEP 90 tablet 0   Pertinent GI related medications were reviewed with the patient  Inpatient Medications  Current Facility-Administered Medications:    0.9 %  sodium chloride  infusion (Manually program via Guardrails IV Fluids), , Intravenous, Once, Paudel, Keshab, MD   0.9 %  sodium chloride  infusion, , Intravenous, Continuous, Paudel, Keshab, MD   acetaminophen  (TYLENOL ) tablet 650 mg, 650 mg, Oral, Q6H PRN **OR** acetaminophen  (TYLENOL ) suppository 650 mg, 650 mg, Rectal, Q6H PRN, Paudel, Nena, MD   [START ON 09/26/2023] amLODipine  (NORVASC ) tablet 10 mg, 10 mg, Oral, Daily, Paudel, Keshab, MD   [START ON 09/26/2023] atorvastatin  (LIPITOR) tablet 80 mg, 80 mg, Oral, Daily, Paudel, Keshab, MD   Chlorhexidine  Gluconate Cloth 2 % PADS 6 each, 6 each, Topical, Daily, Paudel, Keshab, MD, 6 each at 09/25/23 1517   [START ON 09/26/2023] metoprolol  succinate (TOPROL -XL) 24 hr tablet 37.5 mg, 37.5 mg, Oral, Daily, Paudel, Keshab, MD   ondansetron  (ZOFRAN ) tablet 4 mg, 4 mg, Oral, Q6H PRN **OR** ondansetron  (ZOFRAN ) injection 4 mg, 4 mg, Intravenous, Q6H PRN, Paudel, Keshab, MD   oxyCODONE  (Oxy IR/ROXICODONE ) immediate release tablet 5 mg, 5 mg, Oral, Q6H PRN, Paudel, Keshab, MD   pantoprazole  (PROTONIX ) injection 40 mg, 40 mg, Intravenous, Q12H, Paudel, Keshab, MD, 40 mg at 09/25/23 1438   sodium chloride  flush (NS) 0.9 % injection 10-40 mL, 10-40 mL, Intracatheter, PRN, Paudel, Keshab, MD  sodium chloride       acetaminophen  **OR** acetaminophen , ondansetron  **OR** ondansetron  (ZOFRAN ) IV, oxyCODONE , sodium chloride  flush   Objective   Vitals:   09/25/23 1343 09/25/23 1355 09/25/23 1526  BP:  111/66 (!) 101/57  Pulse:  83 74  Resp:  18 18  Temp:  98.4 F (36.9 C) 98.2 F (36.8  C)  TempSrc:  Oral Oral  SpO2:  99% 97%  Weight: 68.9 kg    Height: 5' 5 (1.651 m)       Physical Exam Vitals and nursing note reviewed.  Constitutional:      General: He is not in acute distress.    Appearance: He is ill-appearing. He is not toxic-appearing or diaphoretic.     Comments: Frail appearing  HENT:     Head: Normocephalic and atraumatic.     Nose: Nose normal.  Mouth/Throat:     Mouth: Mucous membranes are moist.     Pharynx: Oropharynx is clear.  Eyes:     General: No scleral icterus.    Extraocular Movements: Extraocular movements intact.  Cardiovascular:     Rate and Rhythm: Normal rate. Rhythm irregular.     Heart sounds: Normal heart sounds. No murmur heard.    No friction rub. No gallop.  Pulmonary:     Effort: Pulmonary effort is normal. No respiratory distress.     Breath sounds: Normal breath sounds. No wheezing, rhonchi or rales.  Abdominal:     General: Bowel sounds are normal. There is distension.     Palpations: Abdomen is soft.     Tenderness: There is no abdominal tenderness. There is no guarding or rebound.  Musculoskeletal:     Cervical back: Neck supple.     Right lower leg: Edema present.     Left lower leg: Edema present.  Skin:    General: Skin is warm and dry.     Coloration: Skin is pale. Skin is not jaundiced.  Neurological:     General: No focal deficit present.     Mental Status: He is alert and oriented to person, place, and time. Mental status is at baseline.  Psychiatric:        Mood and Affect: Mood normal.        Behavior: Behavior normal.        Thought Content: Thought content normal.        Judgment: Judgment normal.     Laboratory Data Recent Labs  Lab 09/25/23 0855 09/25/23 1343  WBC 16.7* 15.2*  HGB 5.7* 5.6*  HCT 18.4* 17.3*  PLT 304 276   Recent Labs  Lab 09/25/23 0855  NA 135  K 3.8  CL 104  CO2 21*  BUN 24*  CALCIUM  8.4*  PROT 5.7*  BILITOT 0.8  ALKPHOS 185*  ALT 20  AST 39  GLUCOSE  126*    Imaging Studies: Reviewed June 2025 CT/PET scan reads  Assessment:   # Gastrointestinal bleeding - Patient reports melena and hematochezia - On Eliquis  for A-fib and history of stroke, history of TAVR  # Acute blood loss anemia - In addition to anemia contributed by systemic chemotherapy, it appears patient has acute blood loss anemia as well - Suspect bleeding has been ongoing for several weeks as he has noted melena during this time. - He has been receiving transfusions through his cancer center.  He is scheduled to receive 2 units today at the hospital  #Metastatic cholangiocarcinoma on systemic chemotherapy #Peritoneal carcinomatosis  # leukocytosis # hypoalbuminemia # elevated alk phos  #Status post cholecystectomy  #GERD  #A-fib # HTN  Plan:  Esophagogastroduodenoscopy and Colonoscopy planned for tomorrow pending patient stability and endoscopy suite availability Go lytely prep ordered. If stools not clear after 1st gallon, start second gallon (ordered prn)  NPO at midnight. OK to drink golytely  prep after midnight. Can add flavorings- avoid red/purple Clear liquids now Labs in am- bmp, cbc, inr Protonix  40 mg iv q12 h Hold dvt ppx Hold Eliquis  Monitor H&H.  Transfusion and resuscitation as per primary team. Currently scheduled to receive two units prbc Hemoglobin will need to be above 7 prior to endoscopy and sedation Avoid frequent lab draws to prevent lab induced anemia Supportive care and antiemetics as per primary team Maintain two sites IV access Avoid nsaids Monitor for GIB. Reviewed previous imaging and endoscopy records  Should patient instability (  worsening tachycardia or hypotension) occur (relating to bleeding) recommend stat CTA to evaluate for source of bleeding and possible IR embolization  D/w patient and family, if egd and colonoscopy do not reveal source, may perform video capsule endoscopy  Esophagogastroduodenoscopy and  Colonoscopy with possible biopsy, control of bleeding, polypectomy, and interventions as necessary has been discussed with the patient/patient representative. Informed consent was obtained from the patient/patient representative after explaining the indication, nature, and risks of the procedure including but not limited to death, bleeding, perforation, missed neoplasm/lesions, cardiorespiratory compromise, and reaction to medications. Opportunity for questions was given and appropriate answers were provided. Patient/patient representative has verbalized understanding is amenable to undergoing the procedure.  Informed consent was obtained from the patient/patient representative after explaining the indication, nature, risks of the procedure, and alternatives of Givens capsule including but not limited to the rare risk of perforation or Given's capsule becoming lodged in the GI tract requiring surgical removal.  Opportunity for questions was given and appropriate answers were provided. Patient/patient representative has verbalized understanding is amenable to undergoing the procedure.  Management of other medical comorbidities as per primary team  I personally performed the service.  Thank you for allowing us  to participate in this patient's care. Please don't hesitate to call if any questions or concerns arise.   Elspeth Ozell Jungling, DO [facility]  Portions of the record may have been created with voice recognition software. Occasional wrong-word or 'sound-a-like' substitutions may have occurred due to the inherent limitations of voice recognition software.  Read the chart carefully and recognize, using context, where substitutions may have occurred.

## 2023-09-25 NOTE — H&P (Signed)
 History and Physical    Jon Mejia FMW:[id number] DOB: 10-26-1940 DOA: 09/25/2023  DOS: the patient was seen and examined on 09/25/2023  PCP: Gasper Nancyann BRAVO, MD   Patient coming from: Home  I have personally briefly reviewed patient's old medical records in [facility]  Chief Complaint: black tarry stool  HPI: Jon Mejia is a pleasant 83 y.o. 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  Patient was found to be severely anemic at 5.7 hemoglobin, complaining of black tarry stool with history of recent multiple blood transfusion for the same, referred directly by oncologist for transfusion and GI workup. Patient was seen examined at bedside.  Patient is alert awake and oriented.  He does not have any complaints or symptoms.  However, he complains of black tarry stool 1 or 2 times every day.  Patient is taking Eliquis  and Motrin .  He denies any nausea, vomiting, hematemesis.  He denies any abdominal pain.    Review of Systems:  ROS  All other systems negative except as noted in the HPI.  Past Medical History:  Diagnosis Date   Angina pectoris (HCC)    Aortic atherosclerosis (HCC)    Aortic stenosis    a.) TTE 03/14/2018: mod AS (MPG 14); b.) TTE 09/13/2019: mild AS (MPG 12.5); c.) TTE 08/16/2021: mod AS (MPG 18.5); d.) R/LHC 09/17/2021: mRA 3, mPA 19, mPCWP 11, LVEDP 15, CO 5.22, CI 2.75; e.) s/p TAVR 11/06/2021   CAD (coronary artery disease) 09/17/2021   a.) R/LHC 09/17/2021: 30% pRCA, 40% dRCA, 50% pLCx, 30% dLCx, 50% pLAD, 50% mLAD, 50% dLAD --> med mgmt   Cardiac murmur    Cervical  spondylosis    Cholelithiasis    CRAO (central retinal artery occlusion), 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   CVA (cerebral vascular accident) (HCC) 03/13/2018   a.) MRI brain 03/14/2018 --> acute infarct in the posterior RIGHT frontal lobe; small chronic cerebellar infarct --> Tx'd with TPA with (+) improvement/resolution of symptoms   Diastolic dysfunction    a.) TTE 01/31/2015: EF 55-60%, MAC, G1DD; b.) TTE 03/14/2018: EF 60-65%, mild LAE, sev MAC, sev AoV cal with mod AS (MPG 14) G2DD; c.) TTE 09/13/2019: EF 55-60%, mild LAE, mild-mod MR, mild AS (MPG 12.5), G2DD; d.) TTE 08/16/2021: EF 60-65%, mild LVH, mod MAC, mild MR, mod AS (MPG 18.5), G1DD; e.) TTE 12/05/2021: EF 60-65%, mod MAC, s/p TAVR (well seated/functioning valve), mild MR, G1DD   GERD (gastroesophageal reflux disease)    History of chicken pox    History of measles    Hyperlipidemia    Hypertension    LBBB (left bundle branch block)    Long term current use of anticoagulant    a.) apixaban    Low back pain radiating to left lower extremity    PAF (paroxysmal atrial fibrillation) (HCC)    a.) CHA2DS2VASc = 6 (age x2, HTN, CVA x2, vascular disease history);  b.) rate/rhythm maintained on oral metoprolol  succinate; chronically anticoagulated with apixaban    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   a.) 26mm S3UR via RIGHT TF approach   Skin cancer     Past Surgical History:  Procedure Laterality Date  INTRAOPERATIVE TRANSTHORACIC ECHOCARDIOGRAM N/A 11/06/2021   Procedure: INTRAOPERATIVE TRANSTHORACIC ECHOCARDIOGRAM;  Surgeon: Verlin Lonni BIRCH, MD;  Location: [facility] OR;  Service: Open Heart Surgery;  Laterality: N/A;   IR IMAGING GUIDED PORT INSERTION  08/07/2023   RIGHT/LEFT HEART CATH AND CORONARY ANGIOGRAPHY N/A 09/17/2021   Procedure: RIGHT/LEFT HEART CATH AND CORONARY ANGIOGRAPHY;  Surgeon: Verlin Lonni BIRCH, MD;  Location: [facility] INVASIVE CV LAB;  Service: Cardiovascular;  Laterality: N/A;   ROBOTIC ASSISTED  LAPAROSCOPIC CHOLECYSTECTOMY  07/24/2022   SKIN CANCER EXCISION  07/2020   TRANSCATHETER AORTIC VALVE REPLACEMENT, TRANSFEMORAL N/A 11/06/2021   Procedure: Transcatheter Aortic Valve Replacement, Transfemoral;  Surgeon: Verlin Lonni BIRCH, MD;  Location: [facility] OR;  Service: Open Heart Surgery;  Laterality: N/A;     reports that he quit smoking about 40 years ago. His smoking use included cigarettes. He started smoking about 60 years ago. He has a 20 pack-year smoking history. He has been exposed to tobacco smoke. He has never used smokeless tobacco. He reports that he does not drink alcohol and does not use drugs.  No Known Allergies  Family History  Problem Relation Age of Onset   Arrhythmia Mother        A-Fib   Lung cancer Father    Diabetes Son    Colon cancer Neg Hx    Prostate cancer Neg Hx     Prior to Admission medications   Medication Sig Start Date End Date Taking? Authorizing Provider  amLODipine  (NORVASC ) 10 MG tablet Take 1 tablet (10 mg total) by mouth daily. 04/25/23   Furth, Cadence H, PA-C  amoxicillin  (AMOXIL ) 500 MG tablet Take 4 tablets (2,000 mg total) by mouth as directed. 1 hour prior to dental work including cleanings Patient not taking: Reported on 09/25/2023 11/15/22   Sebastian Lamarr SAUNDERS, PA-C  amoxicillin -clavulanate (AUGMENTIN ) 875-125 MG tablet Take 1 tablet by mouth 2 (two) times daily. 07/25/23   Borders, Fonda SAUNDERS, NP  apixaban  (ELIQUIS ) 5 MG TABS tablet TAKE ONE (1) TABLET BY MOUTH TWO TIMES PER DAYTAKE ONE (1) TABLET BY MOUTH TWO TIMES PER DAY, may resume on Friday AM, June 28. 07/24/22   Lane Shope, MD  atorvastatin  (LIPITOR) 80 MG tablet TAKE ONE TABLET BY MOUTH ONCE DAILY 04/28/23   Sethi, Pramod S, MD  Cyanocobalamin (B-12) 3000 MCG CAPS Take 3,000 mcg by mouth daily as needed (energy).    [provider]  ibuprofen  (ADVIL ) 800 MG tablet Take 1 tablet (800 mg total) by mouth every 8 (eight) hours as needed. 07/24/22   Lane Shope, MD   lidocaine -prilocaine  (EMLA ) cream Apply to affected area once 09/17/23   Dasie Tinnie MATSU, NP  metoprolol  succinate (TOPROL -XL) 25 MG 24 hr tablet TAKE ONE AND ONE-HALF TABLETS DAILY 06/02/23   Furth, Cadence H, PA-C  ondansetron  (ZOFRAN ) 4 MG tablet Take 1 tablet (4 mg total) by mouth every 8 (eight) hours as needed. 07/10/23 07/09/24  Malvina Alm DASEN, MD  ondansetron  (ZOFRAN ) 8 MG tablet Take 1 tablet (8 mg total) by mouth every 8 (eight) hours as needed for nausea or vomiting. Start on the third day after cisplatin . 07/30/23   Jacobo Evalene PARAS, MD  oxyCODONE  (ROXICODONE ) 5 MG immediate release tablet Take 1 tablet (5 mg total) by mouth every 6 (six) hours as needed. 08/19/23 08/18/24  Dasie Tinnie MATSU, NP  polyethylene glycol powder (GLYCOLAX /MIRALAX ) 17 GM/SCOOP powder Take 17 g by mouth as directed. 08/18/23   Dasie Tinnie MATSU, NP  prochlorperazine  (COMPAZINE ) 10 MG tablet  Take 1 tablet (10 mg total) by mouth every 6 (six) hours as needed (Nausea or vomiting). 07/30/23   Finnegan, Timothy J, MD  senna (SENOKOT) 8.6 MG TABS tablet Take 2 tablets (17.2 mg total) by mouth 2 (two) times daily as needed for mild constipation or moderate constipation. 08/18/23   Dasie Tinnie MATSU, NP  traZODone  (DESYREL ) 50 MG tablet TAKE 1 TABLET BY MOUTH AT BEDTIME AS NEEDED FOR SLEEP 09/23/23   Jacobo Evalene PARAS, MD    Physical Exam: Vitals:   09/25/23 1343 09/25/23 1355  BP:  111/66  Pulse:  83  Resp:  18  Temp:  98.4 F (36.9 C)  TempSrc:  Oral  SpO2:  99%  Weight: 68.9 kg   Height: 5' 5 (1.651 m)     Physical Exam   Constitutional: Alert, awake, calm, comfortable HEENT: Neck supple Respiratory: Clear to auscultation B/L, no wheezing, no rales.  Cardiovascular: Regular rate and rhythm, no murmurs / rubs / gallops. No extremity edema. 2+ pedal pulses. No carotid bruits.  Abdomen: Soft, no tenderness, Bowel sounds positive.  Musculoskeletal: no clubbing / cyanosis. Good ROM, no contractures. Normal muscle tone.   Skin: no rashes, lesions, ulcers. Neurologic: CN 2-12 grossly intact. Sensation intact, No focal deficit identified Psychiatric: Alert and oriented x 3. Normal mood.    Labs on Admission: I have personally reviewed following labs and imaging studies  CBC: Recent Labs  Lab 09/25/23 0855 09/25/23 1343  WBC 16.7* 15.2*  NEUTROABS 13.5*  --   HGB 5.7* 5.6*  HCT 18.4* 17.3*  MCV 90.2 89.2  PLT 304 276   Basic Metabolic Panel: Recent Labs  Lab 09/25/23 0855  NA 135  K 3.8  CL 104  CO2 21*  GLUCOSE 126*  BUN 24*  CREATININE 1.22  CALCIUM  8.4*  MG 1.9   GFR: Estimated Creatinine Clearance: 41.3 mL/min (by C-G formula based on SCr of 1.22 mg/dL). Liver Function Tests: Recent Labs  Lab 09/25/23 0855  AST 39  ALT 20  ALKPHOS 185*  BILITOT 0.8  PROT 5.7*  ALBUMIN 2.2*   No results for input(s): LIPASE, AMYLASE in the last 168 hours. No results for input(s): AMMONIA in the last 168 hours. Coagulation Profile: No results for input(s): INR, PROTIME in the last 168 hours. Cardiac Enzymes: No results for input(s): CKTOTAL, CKMB, CKMBINDEX, TROPONINI, TROPONINIHS in the last 168 hours. BNP (last 3 results) Recent Labs    07/07/23 0858 07/10/23 1121  BNP 39.6 52.3   HbA1C: No results for input(s): HGBA1C in the last 72 hours. CBG: No results for input(s): GLUCAP in the last 168 hours. Lipid Profile: No results for input(s): CHOL, HDL, LDLCALC, TRIG, CHOLHDL, LDLDIRECT in the last 72 hours. Thyroid  Function Tests: No results for input(s): TSH, T4TOTAL, FREET4, T3FREE, THYROIDAB in the last 72 hours. Anemia Panel: No results for input(s): VITAMINB12, FOLATE, FERRITIN, TIBC, IRON, RETICCTPCT in the last 72 hours. Urine analysis:    Component Value Date/Time   COLORURINE YELLOW 11/02/2021 1304   APPEARANCEUR CLEAR 11/02/2021 1304   LABSPEC 1.017 11/02/2021 1304   PHURINE 5.0 11/02/2021 1304   GLUCOSEU  NEGATIVE 11/02/2021 1304   HGBUR NEGATIVE 11/02/2021 1304   BILIRUBINUR NEGATIVE 11/02/2021 1304   KETONESUR NEGATIVE 11/02/2021 1304   PROTEINUR NEGATIVE 11/02/2021 1304   NITRITE NEGATIVE 11/02/2021 1304   LEUKOCYTESUR NEGATIVE 11/02/2021 1304    Radiological Exams on Admission: I have personally reviewed images No results found.  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  1.  Acute upper GI bleeding - He will be admitted to the hospital as inpatient. - He will be given 2 units of blood transfusion, discussed with oncologist who advised not to require leukoreduced.  He will get normal PRBC. - Will check CBC every 6 hours x 3 - It looks like upper GI will start him on Protonix  IV twice a day - He will be n.p.o., IV fluid - GI consult for possible endoscopies - Will hold Motrin  and Eliquis .  2.  Stage IV cholangiocarcinoma - Received second cycle of chemo not completed - Discussed in detail about the prognosis which is poor - Family and patient understood and verbalized understand the instructions and poor prognosis - Will continue to give him pain medications  3.  CAD, aortic stenosis s/p TAVR - Not able to take aspirin  or Eliquis  - Continue other home medications  4.  HTN/HLD - Resume home medications - Continue to monitor blood pressure   DVT prophylaxis: SCDs Code Status: DNR/DNI(Do NOT Intubate) Family Communication: Patient's wife [name] and daughter Eleanor at bedside.  Patient is DO NOT  INTUBATE DO NOT RESUSCITATE. Disposition Plan: Home Consults called: GI Admission status: Inpatient, Telemetry bed   Nena Rebel, MD [facility] 09/25/2023, 2:04 PM

## 2023-09-26 ENCOUNTER — Inpatient Hospital Stay: Admitting: Certified Registered Nurse Anesthetist

## 2023-09-26 ENCOUNTER — Inpatient Hospital Stay

## 2023-09-26 ENCOUNTER — Encounter
Admission: AD | Disposition: A | Discharge status: Home / Self Care | Payer: Self-pay | Source: Ambulatory Visit | Attending: Internal Medicine

## 2023-09-26 ENCOUNTER — Encounter: Payer: Self-pay | Admitting: Hospitalist

## 2023-09-26 ENCOUNTER — Other Ambulatory Visit: Payer: Self-pay | Admitting: *Deleted

## 2023-09-26 ENCOUNTER — Inpatient Hospital Stay: Admitting: Oncology

## 2023-09-26 DIAGNOSIS — K922 Gastrointestinal hemorrhage, unspecified: Secondary | ICD-10-CM

## 2023-09-26 DIAGNOSIS — Z8673 Personal history of transient ischemic attack (TIA), and cerebral infarction without residual deficits: Secondary | ICD-10-CM | POA: Diagnosis not present

## 2023-09-26 DIAGNOSIS — I4891 Unspecified atrial fibrillation: Secondary | ICD-10-CM | POA: Diagnosis not present

## 2023-09-26 DIAGNOSIS — C787 Secondary malignant neoplasm of liver and intrahepatic bile duct: Secondary | ICD-10-CM

## 2023-09-26 HISTORY — PX: COLONOSCOPY: SHX5424

## 2023-09-26 HISTORY — PX: ESOPHAGOGASTRODUODENOSCOPY: SHX5428

## 2023-09-26 HISTORY — PX: GIVENS CAPSULE STUDY: SHX5432

## 2023-09-26 LAB — COMPREHENSIVE METABOLIC PANEL WITH GFR
ALT: 16 U/L (ref 0–44)
AST: 30 U/L (ref 15–41)
Albumin: 1.7 g/dL — ABNORMAL LOW (ref 3.5–5.0)
Alkaline Phosphatase: 125 U/L (ref 38–126)
Anion gap: 9 (ref 5–15)
BUN: 18 mg/dL (ref 8–23)
CO2: 22 mmol/L (ref 22–32)
Calcium: 7.6 mg/dL — ABNORMAL LOW (ref 8.9–10.3)
Chloride: 105 mmol/L (ref 98–111)
Creatinine, Ser: 1.01 mg/dL (ref 0.61–1.24)
GFR, Estimated: >60 mL/min (ref >60)
Glucose, Bld: 95 mg/dL (ref 70–99)
Potassium: 3.5 mmol/L (ref 3.5–5.1)
Sodium: 136 mmol/L (ref 135–145)
Total Bilirubin: 0.7 mg/dL (ref 0.0–1.2)
Total Protein: 4.4 g/dL — ABNORMAL LOW (ref 6.5–8.1)

## 2023-09-26 LAB — CBC
HCT: 19.4 % — ABNORMAL LOW (ref 39.0–52.0)
Hemoglobin: 6.6 g/dL — ABNORMAL LOW (ref 13.0–17.0)
MCH: 29.6 pg (ref 26.0–34.0)
MCHC: 34 g/dL (ref 30.0–36.0)
MCV: 87 fL (ref 80.0–100.0)
Platelets: 223 K/uL (ref 150–400)
RBC: 2.23 MIL/uL — ABNORMAL LOW (ref 4.22–5.81)
RDW: 15.1 % (ref 11.5–15.5)
WBC: 14.4 K/uL — ABNORMAL HIGH (ref 4.0–10.5)
nRBC: 0 % (ref 0.0–0.2)

## 2023-09-26 LAB — PROTIME-INR
INR: 1.3 — ABNORMAL HIGH (ref 0.8–1.2)
Prothrombin Time: 16.9 s — ABNORMAL HIGH (ref 11.4–15.2)

## 2023-09-26 LAB — PREPARE RBC (CROSSMATCH)

## 2023-09-26 LAB — HEMOGLOBIN: Hemoglobin: 8 g/dL — ABNORMAL LOW (ref 13.0–17.0)

## 2023-09-26 SURGERY — EGD (ESOPHAGOGASTRODUODENOSCOPY)
Anesthesia: General

## 2023-09-26 MED ORDER — PROPOFOL 10 MG/ML IV BOLUS
INTRAVENOUS | Status: DC | PRN
  Administered 2023-09-26: 150 ug/kg/min via INTRAVENOUS
  Administered 2023-09-26: 20 mg via INTRAVENOUS
  Administered 2023-09-26: 80 mg via INTRAVENOUS

## 2023-09-26 MED ORDER — ONDANSETRON HCL 4 MG/2ML IJ SOLN
INTRAMUSCULAR | Status: DC | PRN
  Administered 2023-09-26: 4 mg via INTRAVENOUS

## 2023-09-26 MED ORDER — DEXMEDETOMIDINE HCL IN NACL 80 MCG/20ML IV SOLN
INTRAVENOUS | Status: DC | PRN
  Administered 2023-09-26: 8 ug via INTRAVENOUS

## 2023-09-26 MED ORDER — STERILE WATER FOR IRRIGATION IR SOLN
Status: DC | PRN
  Administered 2023-09-26 (×2): 10 mL

## 2023-09-26 MED ORDER — SODIUM CHLORIDE 0.9 % IV SOLN: INTRAVENOUS | Status: DC

## 2023-09-26 NOTE — Interval H&P Note (Signed)
 Upon clarification, the patient was able to drink more prep this morning and it is see through. Daughter and wife are at bedside to confirm.  Esophagogastroduodenoscopy and colonoscopy with possible biopsy, control of bleeding, polypectomy, and interventions as necessary has been discussed with the patient/patient representative. Informed consent was obtained from the patient/patient representative after explaining the indication, nature, and risks of the procedure including but not limited to death, bleeding, perforation, missed neoplasm/lesions, cardiorespiratory compromise, and reaction to medications. Opportunity for questions was given and appropriate answers were provided. Patient/patient representative has verbalized understanding is amenable to undergoing the procedure.  Informed consent was obtained from the patient/patient representative after explaining the indication, nature, risks of the procedure, and alternatives of Givens capsule including but not limited to the rare risk of perforation or Given's capsule becoming lodged in the GI tract requiring surgical removal.  Opportunity for questions was given and appropriate answers were provided. Patient/patient representative has verbalized understanding is amenable to undergoing the procedure.

## 2023-09-26 NOTE — Anesthesia Preprocedure Evaluation (Signed)
 Anesthesia Evaluation  Patient identified by MRN, date of birth, ID band Patient awake    Reviewed: Allergy & Precautions, NPO status , Patient's Chart, lab work & pertinent test results  Airway Mallampati: III  TM Distance: >3 FB Neck ROM: full    Dental  (+) Upper Dentures   Pulmonary neg pulmonary ROS, Patient abstained from smoking., former smoker   Pulmonary exam normal  + decreased breath sounds      Cardiovascular Exercise Tolerance: Poor hypertension, + CAD  negative cardio ROS Normal cardiovascular exam+ dysrhythmias Atrial Fibrillation  Rhythm:Regular     Neuro/Psych CVA, Residual Symptoms negative neurological ROS  negative psych ROS   GI/Hepatic negative GI ROS, Neg liver ROS,GERD  Medicated,,  Endo/Other  negative endocrine ROS    Renal/GU negative Renal ROS  negative genitourinary   Musculoskeletal   Abdominal Normal abdominal exam  (+)   Peds  Hematology negative hematology ROS (+)   Anesthesia Other Findings Past Medical History: No date: Angina pectoris (HCC) No date: Aortic atherosclerosis (HCC) No date: Aortic stenosis     Comment:  a.) TTE 03/14/2018: mod AS (MPG 14); b.) TTE 09/13/2019:              mild AS (MPG 12.5); c.) TTE 08/16/2021: mod AS (MPG               18.5); d.) R/LHC 09/17/2021: mRA 3, mPA 19, mPCWP 11,               LVEDP 15, CO 5.22, CI 2.75; e.) s/p TAVR 11/06/2021 09/17/2021: CAD (coronary artery disease)     Comment:  a.) R/LHC 09/17/2021: 30% pRCA, 40% dRCA, 50% pLCx, 30%               dLCx, 50% pLAD, 50% mLAD, 50% dLAD --> med mgmt No date: Cardiac murmur No date: Cervical spondylosis No date: Cholelithiasis 03/13/2018: CRAO (central retinal artery occlusion), 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: CVA (cerebral vascular accident) [facility])     Comment:  a.) MRI brain 03/14/2018 --> acute infarct in the               posterior RIGHT frontal lobe; small chronic cerebellar                infarct --> Tx'd with TPA with (+) improvement/resolution              of symptoms No date: Diastolic dysfunction     Comment:  a.) TTE 01/31/2015: EF 55-60%, MAC, G1DD; b.) TTE               03/14/2018: EF 60-65%, mild LAE, sev MAC, sev AoV cal               with mod AS (MPG 14) G2DD; c.) TTE 09/13/2019: EF 55-60%,              mild LAE, mild-mod MR, mild AS (MPG 12.5), G2DD; d.) TTE               08/16/2021: EF 60-65%, mild LVH, mod MAC, mild MR, mod AS              (MPG 18.5), G1DD; e.) TTE 12/05/2021: EF 60-65%, mod MAC,              s/p TAVR (well seated/functioning valve), mild MR, G1DD No date: GERD (gastroesophageal reflux disease) No date: History of chicken pox No date: History of measles No date: Hyperlipidemia No date: Hypertension No date: LBBB (left  bundle branch block) No date: Long term current use of anticoagulant     Comment:  a.) apixaban  No date: Low back pain radiating to left lower extremity No date: PAF (paroxysmal atrial fibrillation) (HCC)     Comment:  a.) CHA2DS2VASc = 6 (age x2, HTN, CVA x2, vascular               disease history);  b.) rate/rhythm maintained on oral               metoprolol  succinate; chronically anticoagulated with               apixaban  11/06/2021: S/P TAVR (transcatheter aortic valve replacement)     Comment:  a.) 26mm S3UR via RIGHT TF approach No date: Skin cancer  Past Surgical History: 11/06/2021: INTRAOPERATIVE TRANSTHORACIC ECHOCARDIOGRAM; N/A     Comment:  Procedure: INTRAOPERATIVE TRANSTHORACIC ECHOCARDIOGRAM;               Surgeon: Verlin Lonni BIRCH, MD;  Location: [facility] OR;                Service: Open Heart Surgery;  Laterality: N/A; 08/07/2023: IR IMAGING GUIDED PORT INSERTION 09/17/2021: RIGHT/LEFT HEART CATH AND CORONARY ANGIOGRAPHY; N/A     Comment:  Procedure: RIGHT/LEFT HEART CATH AND CORONARY               ANGIOGRAPHY;  Surgeon: Verlin Lonni BIRCH, MD;                Location: [facility] INVASIVE CV LAB;  Service:  Cardiovascular;                Laterality: N/A; 07/24/2022: ROBOTIC ASSISTED LAPAROSCOPIC CHOLECYSTECTOMY 07/2020: SKIN CANCER EXCISION 11/06/2021: TRANSCATHETER AORTIC VALVE REPLACEMENT, TRANSFEMORAL; N/A     Comment:  Procedure: Transcatheter Aortic Valve Replacement,               Transfemoral;  Surgeon: Verlin Lonni BIRCH, MD;                Location: [facility] OR;  Service: Open Heart Surgery;                Laterality: N/A;  BMI    Body Mass Index: 25.29 kg/m      Reproductive/Obstetrics negative OB ROS                              Anesthesia Physical Anesthesia Plan  ASA: 3  Anesthesia Plan: General   Post-op Pain Management:    Induction: Intravenous  PONV Risk Score and Plan: Propofol  infusion and TIVA  Airway Management Planned: Natural Airway and Nasal Cannula  Additional Equipment:   Intra-op Plan:   Post-operative Plan:   Informed Consent: I have reviewed the patients History and Physical, chart, labs and discussed the procedure including the risks, benefits and alternatives for the proposed anesthesia with the patient or authorized representative who has indicated his/her understanding and acceptance.     Dental Advisory Given  Plan Discussed with: CRNA  Anesthesia Plan Comments:         Anesthesia Quick Evaluation

## 2023-09-26 NOTE — Care Plan (Signed)
 Brief GI Care Plan  Patient underwent EGD and colonoscopy today which were overall unremarkable.  He did have old blood and clot in the colon and in the terminal ileum.  Unclear if this in the terminal ileum was backwash versus from more proximal in the small bowel.  Due to this he is undergoing video capsule endoscopy at this time.  NPO until 1545.  Clear liquids from 15 45-17 45.  Light meal after  Would make n.p.o. at midnight again in case enteroscopy is indicated.  Continue Protonix  40 mg iv q12 h Hold dvt ppx and anticoagulant  Monitor H&H.  Transfusion and resuscitation as per primary team, keeping hgb above 7 Avoid frequent lab draws to prevent lab induced anemia Supportive care and antiemetics as per primary team Maintain two sites IV access Avoid nsaids Monitor for GIB.  This was discussed with his primary team, oncologist and family  Dr. Unk will be taking over the GI service over the weekend and will follow-up  Elspeth EMERSON Jungling, DO [facility]

## 2023-09-26 NOTE — Progress Notes (Signed)
 1      PROGRESS NOTE    Jon Mejia  FMW:[id number] DOB: Jul 05, 1940 DOA: 09/25/2023 PCP: Gasper Nancyann BRAVO, MD   Brief Narrative:   83 y.o. 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   1.  Acute upper GI bleeding - Status post EGD and colonoscopy by Dr. Onita on 8/29-it showed old blood and clot in the colon and in the terminal ileum.  Video capsule endoscopy undergoing.  Dr. Unk to follow tomorrow - Status post 2 PRBC transfusion - Continue Protonix  IV twice a day - Await DVT prophylaxis and anticoagulant   2.  Stage IV cholangiocarcinoma - Received second cycle of chemo not completed - Family and patient understood and verbalized understand the instructions and poor prognosis - As needed pain medication.  Dr. Jacobo aware   3.  CAD, aortic stenosis s/p TAVR - Not able  to take aspirin  or Eliquis  - Continue other home medications   4.  HTN/HLD - Continue home medications - Continue to monitor blood pressure     DVT prophylaxis: SCDs SCDs Start: 09/25/23 1319     Code Status: DNR Family Communication: Discussed with family at bedside Disposition Plan: Possible discharge in next 2 to 3 days depending on clinical condition and GI workup   Consultants:  GI  Procedures:  EGD and colonoscopy on 8/29     Subjective:  He is feeling fine.  Denies any other issues other than being tired.  Family at bedside  Objective: Vitals:   09/26/23 1230 09/26/23 1231 09/26/23 1240 09/26/23 1250  BP: (!) 94/58 (!) 94/58 103/72   Pulse: 77 (!) 126 77 76  Resp:  20    Temp: 98.6 F (37 C)     TempSrc: Temporal     SpO2:  96% 97% 96%  Weight:      Height:        Intake/Output Summary (Last 24 hours) at 09/26/2023 1542 Last data filed at 09/26/2023 1223 Gross per 24 hour  Intake 2237.85 ml  Output 0 ml  Net 2237.85 ml   Filed Weights   09/25/23 1343  Weight: 68.9 kg    Examination:  General exam: Appears calm and comfortable  Respiratory system: Clear to auscultation. Respiratory effort normal. Cardiovascular system: S1 & S2 heard, RRR. No JVD, murmurs, rubs, gallops or clicks. No pedal edema. Gastrointestinal system: Abdomen is soft, benign Central nervous system: Alert and oriented. No focal neurological  deficits. Extremities: Symmetric 5 x 5 power. Skin: No rashes, lesions or ulcers Psychiatry: Judgement and insight appear normal. Mood & affect appropriate.     Data Reviewed: I have personally reviewed following labs and imaging studies  CBC: Recent Labs  Lab 09/25/23 0855 09/25/23 1343 09/25/23 2249 09/26/23 0436 09/26/23 0928  WBC 16.7* 15.2* 18.0* 14.4*  --   NEUTROABS 13.5*  --   --   --   --   HGB 5.7* 5.6* 7.9* 6.6* 8.0*  HCT 18.4* 17.3* 23.6* 19.4*  --   MCV 90.2 89.2 87.1 87.0  --   PLT 304 276 275 223  --     Basic Metabolic Panel: Recent Labs  Lab 09/25/23 0855 09/26/23 0436  NA 135 136  K 3.8 3.5  CL 104 105  CO2 21* 22  GLUCOSE 126* 95  BUN 24* 18  CREATININE 1.22 1.01  CALCIUM  8.4* 7.6*  MG 1.9  --    GFR: Estimated Creatinine Clearance: 49.9 mL/min (by C-G formula based on SCr of 1.01 mg/dL). Liver Function Tests: Recent Labs  Lab 09/25/23 0855 09/26/23 0436  AST 39 30  ALT 20 16  ALKPHOS 185* 125  BILITOT 0.8 0.7  PROT 5.7* 4.4*  ALBUMIN 2.2* 1.7*   No results for input(s): LIPASE, AMYLASE in the last 168 hours. No results for input(s): AMMONIA in the last 168 hours. Coagulation Profile: Recent Labs  Lab 09/26/23 0436  INR 1.3*      Radiology Studies: No results found.      Scheduled Meds:  amLODipine   10 mg Oral Daily   atorvastatin   80 mg Oral Daily   Chlorhexidine  Gluconate Cloth  6 each Topical Daily   metoprolol  succinate  37.5 mg Oral Daily   pantoprazole  (PROTONIX ) IV  40 mg Intravenous Q12H   Continuous Infusions:   LOS: 1 day    Time spent: 35 minutes    Cresencio Fairly, MD [facility] Pager [phone]  If 7PM-7AM, please contact night-coverage [email or web address]  09/26/2023, 3:42 PM

## 2023-09-26 NOTE — Plan of Care (Signed)
 Pt alert and oriented x4,; daughter at bedside. Pt tolerating bowel prep, been having BM continuously, no bright red blood in stool. Hgb 7.9 after 2 units PRBCs given. Continue to monitor Problem: Clinical Measurements: Goal: Ability to maintain clinical measurements within normal limits will improve Outcome: Progressing   Problem: Activity: Goal: Risk for activity intolerance will decrease Outcome: Progressing   Problem: Coping: Goal: Level of anxiety will decrease Outcome: Progressing   Problem: Elimination: Goal: Will not experience complications related to bowel motility Outcome: Progressing   Problem: Pain Managment: Goal: General experience of comfort will improve and/or be controlled Outcome: Progressing   Problem: Safety: Goal: Ability to remain free from injury will improve Outcome: Progressing

## 2023-09-26 NOTE — Op Note (Signed)
 [facility] Gastroenterology Patient Name: Jon Mejia Procedure Date: 09/26/2023 11:50 AM MRN: [id number] Account #: [id number] Date of Birth: 11/26/1940 Admit Type: Inpatient Age: 83 Room: [facility] ENDO ROOM 1 Gender: Male Note Status: Finalized Instrument Name: Colon Scope [id number] Procedure:             Colonoscopy Indications:           Acute post hemorrhagic anemia Providers:             Elspeth Ozell Onita ROSALEA, DO Referring MD:          Evalene PARAS. Jacobo, MD (Referring MD), Nancyann BRAVO.                         Gasper, MD (Referring MD) Medicines:             Monitored Anesthesia Care Complications:         No immediate complications. Estimated blood loss: None. Procedure:             Pre-Anesthesia Assessment:                        - Prior to the procedure, a History and Physical was                         performed, and patient medications and allergies were                         reviewed. The patient is competent. The risks and                         benefits of the procedure and the sedation options and                         risks were discussed with the patient. All questions                         were answered and informed consent was obtained.                         Patient identification and proposed procedure were                         verified by the physician, the nurse, the anesthetist                         and the technician in the endoscopy suite. Mental                         Status Examination: alert and oriented. Airway                         Examination: normal oropharyngeal airway and neck                         mobility. Respiratory Examination: clear to                         auscultation. CV Examination: RRR, no murmurs, no S3  or S4. Prophylactic Antibiotics: The patient does not                         require prophylactic antibiotics. Prior                         Anticoagulants: The patient has  taken no anticoagulant                         or antiplatelet agents. ASA Grade Assessment: IV - A                         patient with severe systemic disease that is a                         constant threat to life. After reviewing the risks and                         benefits, the patient was deemed in satisfactory                         condition to undergo the procedure. The anesthesia                         plan was to use monitored anesthesia care (MAC).                         Immediately prior to administration of medications,                         the patient was re-assessed for adequacy to receive                         sedatives. The heart rate, respiratory rate, oxygen                         saturations, blood pressure, adequacy of pulmonary                         ventilation, and response to care were monitored                         throughout the procedure. The physical status of the                         patient was re-assessed after the procedure.                        After obtaining informed consent, the colonoscope was                         passed under direct vision. Throughout the procedure,                         the patient's blood pressure, pulse, and oxygen                         saturations were monitored continuously. The  Colonoscope was introduced through the anus and                         advanced to the the terminal ileum, with                         identification of the appendiceal orifice and IC                         valve. The colonoscopy was performed without                         difficulty. The patient tolerated the procedure well.                         The quality of the bowel preparation was evaluated                         using the BBPS [facility] Bowel Preparation Scale) with                         scores of: Right Colon = 2 (minor amount of residual                         staining, small  fragments of stool and/or opaque                         liquid, but mucosa seen well), Transverse Colon = 2                         (minor amount of residual staining, small fragments of                         stool and/or opaque liquid, but mucosa seen well) and                         Left Colon = 2 (minor amount of residual staining,                         small fragments of stool and/or opaque liquid, but                         mucosa seen well). The total BBPS score equals 6. The                         quality of the bowel preparation was good. The                         terminal ileum, ileocecal valve, appendiceal orifice,                         and rectum were photographed. Findings:      The digital rectal exam findings include decreased sphincter tone and       internal hemorrhoids that prolapse with straining, but spontaneously       regress to the resting position (Grade II).      Old blood/clot noted throughout the  colon and into the distal ileum with       no culprit lesion noted. Prep was good and easily washed away. There       appeared to be older blood and clot proximal to the extent of the exam,       but could nto advance further into the distal ileum. Estimated blood       loss: none.      Non-bleeding internal hemorrhoids were found during retroflexion and       during perianal exam. The hemorrhoids were Grade II (internal       hemorrhoids that prolapse but reduce spontaneously). Estimated blood       loss: none. Impression:            - Decreased sphincter tone and internal hemorrhoids                         that prolapse with straining, but spontaneously                         regress to the resting position (Grade II) found on                         digital rectal exam.                        - Non-bleeding internal hemorrhoids.                        - No specimens collected. Recommendation:        - Patient has a contact number available for                          emergencies. The signs and symptoms of potential                         delayed complications were discussed with the patient.                         Return to normal activities tomorrow. Written                         discharge instructions were provided to the patient.                        - Return patient to hospital ward for ongoing care.                        - NPO.                        - Continue present medications.                        - Perform Video Capsule Endoscopy to assess for small                         bowel bleeding source.                        Can consider tagged rbc scan if no other source found.                        -  The findings and recommendations were discussed with                         the patient.                        - The findings and recommendations were discussed with                         the patient's family.                        - The findings and recommendations were discussed with                         the referring physician. Procedure Code(s):     --- Professional ---                        [phone], Colonoscopy, flexible; diagnostic, including                         collection of specimen(s) by brushing or washing, when                         performed (separate procedure) Diagnosis Code(s):     --- Professional ---                        K64.1, Second degree hemorrhoids                        K62.89, Other specified diseases of anus and rectum                        D62, Acute posthemorrhagic anemia CPT copyright 2022 American Medical Association. All rights reserved. The codes documented in this report are preliminary and upon coder review may  be revised to meet current compliance requirements. Attending Participation:      I personally performed the entire procedure. Elspeth Jungling, DO Elspeth Ozell Jungling DO, DO 09/26/2023 12:32:43 PM This report has been signed electronically. Number of Addenda: 0 Note  Initiated On: 09/26/2023 11:50 AM Scope Withdrawal Time: 0 hours 7 minutes 53 seconds  Total Procedure Duration: 0 hours 10 minutes 53 seconds  Estimated Blood Loss:  Estimated blood loss: none.      [facility]

## 2023-09-26 NOTE — Op Note (Signed)
 [facility] Gastroenterology Patient Name: Jon Mejia Procedure Date: 09/26/2023 11:44 AM MRN: [id number] Account #: [id number] Date of Birth: 10/26/1940 Admit Type: Inpatient Age: 83 Room: [facility] ENDO ROOM 1 Gender: Male Note Status: Finalized Instrument Name: Upper GI Scope [id number] Procedure:             Upper GI endoscopy Indications:           Acute post hemorrhagic anemia Providers:             Elspeth Ozell Onita ROSALEA, DO Referring MD:          Evalene PARAS. Jacobo, MD (Referring MD), Nancyann BRAVO.                         Gasper, MD (Referring MD) Medicines:             Monitored Anesthesia Care Complications:         No immediate complications. Estimated blood loss: None. Procedure:             Pre-Anesthesia Assessment:                        - Prior to the procedure, a History and Physical was                         performed, and patient medications and allergies were                         reviewed. The patient is competent. The risks and                         benefits of the procedure and the sedation options and                         risks were discussed with the patient. All questions                         were answered and informed consent was obtained.                         Patient identification and proposed procedure were                         verified by the physician, the nurse, the anesthetist                         and the technician in the endoscopy suite. Mental                         Status Examination: alert and oriented. Airway                         Examination: normal oropharyngeal airway and neck                         mobility. Respiratory Examination: clear to                         auscultation. CV Examination: RRR, no murmurs, no S3  or S4. Prophylactic Antibiotics: The patient does not                         require prophylactic antibiotics. Prior                         Anticoagulants: The  patient has taken no anticoagulant                         or antiplatelet agents. ASA Grade Assessment: IV - A                         patient with severe systemic disease that is a                         constant threat to life. After reviewing the risks and                         benefits, the patient was deemed in satisfactory                         condition to undergo the procedure. The anesthesia                         plan was to use monitored anesthesia care (MAC).                         Immediately prior to administration of medications,                         the patient was re-assessed for adequacy to receive                         sedatives. The heart rate, respiratory rate, oxygen                         saturations, blood pressure, adequacy of pulmonary                         ventilation, and response to care were monitored                         throughout the procedure. The physical status of the                         patient was re-assessed after the procedure.                        After obtaining informed consent, the endoscope was                         passed under direct vision. Throughout the procedure,                         the patient's blood pressure, pulse, and oxygen                         saturations were monitored continuously. The Endoscope  was introduced through the mouth, and advanced to the                         third part of duodenum. The upper GI endoscopy was                         accomplished without difficulty. The patient tolerated                         the procedure well. Findings:      The duodenal bulb, first portion of the duodenum, second portion of the       duodenum and third portion of the duodenum were normal. Estimated blood       loss: none.      The entire examined stomach was normal. Estimated blood loss: none.      Esophagogastric landmarks were identified: the gastroesophageal junction        was found at 32 cm from the incisors.      One benign-appearing, intrinsic moderate (circumferential scarring or       stenosis; an endoscope may pass) stenosis was found 32 cm from the       incisors. This stenosis measured 1.2 cm (inner diameter) x less than one       cm (in length). The stenosis was traversed. No dilation performed due to       assessment to rule out active GI bleeding.      The exam of the esophagus was otherwise normal.      The Z-line was regular. Estimated blood loss: none.      No signs of fresh or old blood. Impression:            - Normal duodenal bulb, first portion of the duodenum,                         second portion of the duodenum and third portion of                         the duodenum.                        - Normal stomach.                        - Esophagogastric landmarks identified.                        - Benign-appearing esophageal stenosis.                        - Z-line regular.                        - No specimens collected. Recommendation:        - Return patient to hospital ward for ongoing care.                        - NPO.                        - Continue present medications.                        -  Further recommendations pending colonscopy. see                         report.                        - The findings and recommendations were discussed with                         the patient.                        - The findings and recommendations were discussed with                         the patient's family.                        - The findings and recommendations were discussed with                         the referring physician. Procedure Code(s):     --- Professional ---                        [phone], Esophagogastroduodenoscopy, flexible,                         transoral; diagnostic, including collection of                         specimen(s) by brushing or washing, when performed                         (separate  procedure) Diagnosis Code(s):     --- Professional ---                        K22.2, Esophageal obstruction                        D62, Acute posthemorrhagic anemia CPT copyright 2022 American Medical Association. All rights reserved. The codes documented in this report are preliminary and upon coder review may  be revised to meet current compliance requirements. Attending Participation:      I personally performed the entire procedure. Elspeth Jungling, DO Elspeth Ozell Jungling DO, DO 09/26/2023 12:09:27 PM This report has been signed electronically. Number of Addenda: 0 Note Initiated On: 09/26/2023 11:44 AM Estimated Blood Loss:  Estimated blood loss: none.      [facility]

## 2023-09-26 NOTE — TOC Initial Note (Signed)
 Transition of Care [facility]) - Initial/Assessment Note    Patient Details  Name: Jon Mejia MRN: [id number] Date of Birth: 09-Jul-1940  Transition of Care [facility]) CM/SW Contact:    Alfonso Rummer, LCSW Phone Number: 09/26/2023, 4:30 PM  Clinical Narrative:      LCSW met with pt and family. Pcp is [place] [facility]. Pt has shower chair and cane. No hx with dme or home health at this time. Pt family provided with list of home health agency per family request.        Expected Discharge Plan: Home w Home Health Services Barriers to Discharge: Continued Medical Work up   Patient Goals and CMS Choice            Expected Discharge Plan and Services                                              Prior Living Arrangements/Services   Lives with:: Relatives                   Activities of Daily Living   ADL Screening (condition at time of admission) Independently performs ADLs?: Yes (appropriate for developmental age) Is the patient deaf or have difficulty hearing?: No Does the patient have difficulty seeing, even when wearing glasses/contacts?: No Does the patient have difficulty concentrating, remembering, or making decisions?: No  Permission Sought/Granted                  Emotional Assessment              Admission diagnosis:  GI bleed [K92.2] GI bleeding [K92.2] Patient Active Problem List   Diagnosis Date Noted   GI bleeding 09/25/2023   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 (HCC) 08/12/2023   Peritoneal carcinomatosis (HCC) 07/30/2023   Gastroesophageal reflux disease without esophagitis 06/20/2022   Calculus of gallbladder without cholecystitis without obstruction 05/30/2022   New onset left bundle branch block (LBBB) 11/07/2021   Atrial fibrillation (HCC) 11/06/2021   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   Moderate mitral regurgitation 10/03/2020   History of stroke 03/13/2018   Back pain with  left-sided radiculopathy 01/19/2015   Syncope 10/21/2011   Pure hypercholesterolemia 01/11/2008   Primary hypertension 06/08/2007   Premature atrial beats 01/05/2007   PCP:  Gasper Nancyann BRAVO, MD Pharmacy:   [facility] - [place], [place] - [address] [place] [address] Phone: [phone] Fax: [phone]     Social Drivers of Health (SDOH) Social History: SDOH Screenings   Food Insecurity: Patient Declined (09/25/2023)  Housing: Patient Declined (09/25/2023)  Transportation Needs: Patient Declined (09/25/2023)  Utilities: Patient Declined (09/25/2023)  Alcohol Screen: Low Risk  (12/17/2022)  Depression (PHQ2-9): Low Risk  (09/11/2023)  Financial Resource Strain: Low Risk  (07/06/2023)  Physical Activity: Sufficiently Active (07/06/2023)  Social Connections: Patient Declined (09/25/2023)  Stress: No Stress Concern Present (07/06/2023)  Tobacco Use: Medium Risk (09/26/2023)  Health Literacy: Adequate Health Literacy (12/17/2022)   SDOH Interventions:     Readmission Risk Interventions     No data to display

## 2023-09-26 NOTE — Anesthesia Postprocedure Evaluation (Signed)
 Anesthesia Post Note  Patient: Jon Mejia  Procedure(s) Performed: EGD (ESOPHAGOGASTRODUODENOSCOPY) COLONOSCOPY IMAGING PROCEDURE, GI TRACT, INTRALUMINAL, VIA CAPSULE  Patient location during evaluation: PACU Anesthesia Type: General Level of consciousness: sedated Pain management: pain level controlled Respiratory status: spontaneous breathing Cardiovascular status: stable Anesthetic complications: no   No notable events documented.   Last Vitals:  Vitals:   09/26/23 1230 09/26/23 1231  BP: (!) 94/58 (!) 94/58  Pulse: 77 (!) 126  Resp:  20  Temp: 37 C   SpO2:  96%    Last Pain:  Vitals:   09/26/23 1230  TempSrc: Temporal  PainSc: Asleep                 VAN STAVEREN,Herma Uballe

## 2023-09-26 NOTE — Progress Notes (Signed)
 Initial Nutrition Assessment  DOCUMENTATION CODES:   Not applicable  INTERVENTION:   -RD will follow for diet advancement and add supplements as appropriate   NUTRITION DIAGNOSIS:   Increased nutrient needs related to cancer and cancer related treatments as evidenced by estimated needs.  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  Patient was found to be severely anemic at 5.7 hemoglobin, complaining of black tarry stool with history of recent multiple blood transfusion for the same, referred directly by oncologist for transfusion and GI workup.  Pt admitted with acute upper GIB.   8/29- s/p EGD- revealed benign-appearing esophageal stenosis (transversed); s/p colonoscopy- revealed Decreased sphincter tone and internal hemorrhoids that prolapse with straining, but spontaneously regress to the resting position (Grade II) found on digital rectal exam  Reviewed I/O's: +1.8 L x 24 hours  Per H&P, pt had had black and tarry stools for 1-2 days PTA.   Pt out of room; in endo suite at time of visit. No family present o provide additional history. Pt unavailable at time of visit. RD unable to obtain further nutrition-related history or complete nutrition-focused physical exam at this time.    Per RD notes at [facility]; pt has had poor oral intake since starting chemo treatments, reports just nibbling. Pt  has tried Boost and [employer], but does not particularly care for them. He prefers Ensure shakes.   Reviewed wt hx; pt has experienced a 3.2% wt loss over the past month, which is not significant for time frame.   Medications reviewed and include protonix  and sodium chloride  infusion @ 40 ml/hr.   Labs reviewed.    Diet Order:   Diet Order             Diet NPO time specified  Diet effective midnight                   EDUCATION NEEDS:   No education needs have been identified at this time  Skin:  Skin Assessment: Reviewed RN Assessment  Last BM:  09/26/23 (type 7)  Height:   Ht Readings from Last 1 Encounters:  09/25/23 5' 5 (1.651 m)    Weight:   Wt Readings from Last 1 Encounters:  09/25/23 68.9 kg    Ideal Body Weight:  61.8 kg  BMI:  Body mass index is 25.29 kg/m.  Estimated Nutritional Needs:   Kcal:  1700-1900  Protein:  90-105 grams  Fluid:  1.7-1.9 L    Margery ORN, RD, LDN, CDCES Registered Dietitian III Certified Diabetes Care and Education Specialist If unable to reach this RD, please use RD Inpatient group chat on secure chat between hours of 8am-4 pm daily

## 2023-09-26 NOTE — Transfer of Care (Signed)
 Immediate Anesthesia Transfer of Care Note  Patient: Jon Mejia  Procedure(s) Performed: EGD (ESOPHAGOGASTRODUODENOSCOPY) COLONOSCOPY IMAGING PROCEDURE, GI TRACT, INTRALUMINAL, VIA CAPSULE  Patient Location: Endoscopy Unit  Anesthesia Type:General  Level of Consciousness: awake and drowsy  Airway & Oxygen Therapy: Patient Spontanous Breathing  Post-op Assessment: Report given to RN and Post -op Vital signs reviewed and stable  Post vital signs: Reviewed and stable  Last Vitals:  Vitals Value Taken Time  BP 94/58 09/26/23 12:31  Temp 37 C 09/26/23 12:30  Pulse 76 09/26/23 12:31  Resp 22 09/26/23 12:31  SpO2 98 % 09/26/23 12:31  Vitals shown include unfiled device data.  Last Pain:  Vitals:   09/26/23 1230  TempSrc: Temporal  PainSc: Asleep         Complications: No notable events documented.

## 2023-09-26 NOTE — Progress Notes (Signed)
 Pt alert; daughter at bedside. Hgb 6.6 @a .m; Janese America, NP, see new orders to transfuse PRBC.

## 2023-09-26 NOTE — Interval H&P Note (Signed)
 History and Physical Interval Note: Consult note from 09/25/23 was reviewed.  He did appropriately improved with 2 units of PRBCs yesterday, but then hemoglobin down trended again and he received 1 additional unit this morning.  Hemoglobin improved after this.  He did not complete his colonoscopy prep drinking half or less.  Stools are still dark. Remains hemodynamically stable.  Will plan to perform EGD today.  Further recommendations pending results.  Written consent was obtained from the patient after discussion of risks, benefits, and alternatives. Patient has consented to proceed with Esophagogastroduodenoscopy with possible intervention   09/26/2023 11:40 AM  Jon Mejia  has presented today for surgery, with the diagnosis of melena, hematochezia, anemia.  The various methods of treatment have been discussed with the patient and family. After consideration of risks, benefits and other options for treatment, the patient has consented to  Procedure(s): EGD (ESOPHAGOGASTRODUODENOSCOPY) (N/A) COLONOSCOPY (N/A) as a surgical intervention.  The patient's history has been reviewed, patient examined, no change in status, stable for surgery.  I have reviewed the patient's chart and labs.  Questions were answered to the patient's satisfaction.     Elspeth Ozell Jungling

## 2023-09-27 ENCOUNTER — Inpatient Hospital Stay

## 2023-09-27 DIAGNOSIS — I4891 Unspecified atrial fibrillation: Secondary | ICD-10-CM | POA: Diagnosis not present

## 2023-09-27 DIAGNOSIS — E7800 Pure hypercholesterolemia, unspecified: Secondary | ICD-10-CM

## 2023-09-27 DIAGNOSIS — Z452 Encounter for adjustment and management of vascular access device: Secondary | ICD-10-CM | POA: Diagnosis not present

## 2023-09-27 DIAGNOSIS — K922 Gastrointestinal hemorrhage, unspecified: Secondary | ICD-10-CM | POA: Diagnosis not present

## 2023-09-27 DIAGNOSIS — Z8673 Personal history of transient ischemic attack (TIA), and cerebral infarction without residual deficits: Secondary | ICD-10-CM | POA: Diagnosis not present

## 2023-09-27 LAB — BASIC METABOLIC PANEL WITH GFR
Anion gap: 6 (ref 5–15)
BUN: 14 mg/dL (ref 8–23)
CO2: 23 mmol/L (ref 22–32)
Calcium: 7.8 mg/dL — ABNORMAL LOW (ref 8.9–10.3)
Chloride: 105 mmol/L (ref 98–111)
Creatinine, Ser: 1.08 mg/dL (ref 0.61–1.24)
GFR, Estimated: >60 mL/min (ref >60)
Glucose, Bld: 92 mg/dL (ref 70–99)
Potassium: 3.4 mmol/L — ABNORMAL LOW (ref 3.5–5.1)
Sodium: 134 mmol/L — ABNORMAL LOW (ref 135–145)

## 2023-09-27 LAB — FERRITIN: Ferritin: 636 ng/mL — ABNORMAL HIGH (ref 24–336)

## 2023-09-27 LAB — IRON AND TIBC
Iron: 11 ug/dL — ABNORMAL LOW (ref 45–182)
Saturation Ratios: 10 % — ABNORMAL LOW (ref 17.9–39.5)
TIBC: 108 ug/dL — ABNORMAL LOW (ref 250–450)
UIBC: 97 ug/dL

## 2023-09-27 LAB — CBC
HCT: 22.4 % — ABNORMAL LOW (ref 39.0–52.0)
Hemoglobin: 7.5 g/dL — ABNORMAL LOW (ref 13.0–17.0)
MCH: 29.1 pg (ref 26.0–34.0)
MCHC: 33.5 g/dL (ref 30.0–36.0)
MCV: 86.8 fL (ref 80.0–100.0)
Platelets: 210 K/uL (ref 150–400)
RBC: 2.58 MIL/uL — ABNORMAL LOW (ref 4.22–5.81)
RDW: 14.9 % (ref 11.5–15.5)
WBC: 15.8 K/uL — ABNORMAL HIGH (ref 4.0–10.5)
nRBC: 0 % (ref 0.0–0.2)

## 2023-09-27 LAB — PREPARE RBC (CROSSMATCH)

## 2023-09-27 LAB — HEMOGLOBIN AND HEMATOCRIT, BLOOD
HCT: 26.2 % — ABNORMAL LOW (ref 39.0–52.0)
Hemoglobin: 9 g/dL — ABNORMAL LOW (ref 13.0–17.0)

## 2023-09-27 LAB — FOLATE: Folate: 12.9 ng/mL (ref >5.9)

## 2023-09-27 MED ORDER — POTASSIUM CHLORIDE 10 MEQ/100ML IV SOLN
10.0000 meq | INTRAVENOUS | Status: AC
  Administered 2023-09-27 (×3): 10 meq via INTRAVENOUS
  Filled 2023-09-27 (×3): qty 100

## 2023-09-27 MED ORDER — DIPHENHYDRAMINE HCL 25 MG PO CAPS
50.0000 mg | ORAL_CAPSULE | Freq: Once | ORAL | Status: AC
  Administered 2023-09-27: 50 mg via ORAL
  Filled 2023-09-27: qty 2

## 2023-09-27 MED ORDER — POLYETHYLENE GLYCOL 3350 17 GM/SCOOP PO POWD
119.0000 g | Freq: Once | ORAL | Status: AC
  Administered 2023-09-27: 119 g via ORAL
  Filled 2023-09-27: qty 119

## 2023-09-27 MED ORDER — DIPHENHYDRAMINE HCL 25 MG PO CAPS: 25.0000 mg | ORAL_CAPSULE | Freq: Once | ORAL | Status: DC

## 2023-09-27 NOTE — Consult Note (Signed)
 PHARMACY CONSULT NOTE - ELECTROLYTES  Pharmacy Consult for Electrolyte Monitoring and Replacement   Recent Labs: Potassium (mmol/L)  Date Value  09/27/2023 3.4 (L)   Magnesium  (mg/dL)  Date Value  91/71/7974 1.9   Calcium  (mg/dL)  Date Value  91/69/7974 7.8 (L)   Albumin (g/dL)  Date Value  91/70/7974 1.7 (L)  07/07/2023 4.0   Phosphorus (mg/dL)  Date Value  88/93/7980 2.7   Sodium (mmol/L)  Date Value  09/27/2023 134 (L)  07/07/2023 136   Height: 5' 5 (165.1 cm) Weight: 68.9 kg (152 lb) IBW/kg (Calculated) : 61.5 Estimated Creatinine Clearance: 46.7 mL/min (by C-G formula based on SCr of 1.08 mg/dL).  Assessment  Jon Mejia is a 83 y.o. male presenting with GIB. 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 Pharmacy has been consulted to monitor and replace electrolytes.  Diet: NPO MIVF: N/A Pertinent medications: N/A  Goal of Therapy: Electrolytes within normal limits  Plan:  K 3.4, Kcl 10 mEq IV q1h x 3 Re-check electrolytes tomorrow AM  Thank you for allowing pharmacy to be a part of this patient's care.  Marolyn KATHEE Mare 09/27/2023 11:32 AM

## 2023-09-27 NOTE — Plan of Care (Signed)
 Pt informed by Mariellen MD about finding. Pt scheduled for procedure at 1330 tomorrow.

## 2023-09-27 NOTE — Plan of Care (Signed)

## 2023-09-27 NOTE — Care Management Important Message (Signed)
 Important Message  Patient Details  Name: Jon Mejia MRN: [id number] Date of Birth: 24-May-1940   Important Message Given:  Yes - Medicare IM     Rojelio SHAUNNA Rattler 09/27/2023, 2:21 PM

## 2023-09-27 NOTE — Progress Notes (Signed)
 Jon JONELLE Brooklyn, MD [address]  [place], [place] [address]  Main: [phone] Fax:  [phone] Pager: [phone]   Subjective: No acute events overnight, he reports vague abdominal discomfort, denies having any further bowel movements since EGD and colonoscopy yesterday.  He also underwent video capsule study.  His wife and daughter are bedside.  Patient denies any nausea or vomiting.  Has been passing gas.   Objective: Vital signs in last 24 hours: Vitals:   09/26/23 1925 09/27/23 0006 09/27/23 0346 09/27/23 0830  BP: 120/67 124/71 118/65 119/81  Pulse:  80 73 96  Resp: 18 19 19 18   Temp: 98.8 F (37.1 C) 99 F (37.2 C) 98.6 F (37 C) 98.4 F (36.9 C)  TempSrc:      SpO2: 98% 97% 98% 97%  Weight:      Height:       Weight change:   Intake/Output Summary (Last 24 hours) at 09/27/2023 1509 Last data filed at 09/27/2023 1425 Gross per 24 hour  Intake 101 ml  Output --  Net 101 ml     Exam: Heart:: Regular rate and rhythm, S1S2 present, or without murmur or extra heart sounds Lungs: normal and clear to auscultation Abdomen: soft, nontender, normal bowel sounds   Lab Results:    Latest Ref Rng & Units 09/27/2023    5:05 AM 09/26/2023    9:28 AM 09/26/2023    4:36 AM  CBC  WBC 4.0 - 10.5 K/uL 15.8   14.4   Hemoglobin 13.0 - 17.0 g/dL 7.5  8.0  6.6   Hematocrit 39.0 - 52.0 % 22.4   19.4   Platelets 150 - 400 K/uL 210   223       Latest Ref Rng & Units 09/27/2023    5:05 AM 09/26/2023    4:36 AM 09/25/2023    8:55 AM  CMP  Glucose 70 - 99 mg/dL 92  95  873   BUN 8 - 23 mg/dL 14  18  24    Creatinine 0.61 - 1.24 mg/dL 8.91  8.98  8.77   Sodium 135 - 145 mmol/L 134  136  135   Potassium 3.5 - 5.1 mmol/L 3.4  3.5  3.8   Chloride 98 - 111 mmol/L 105  105  104   CO2 22 - 32 mmol/L 23  22  21    Calcium  8.9 - 10.3 mg/dL 7.8  7.6  8.4   Total Protein 6.5 - 8.1 g/dL  4.4  5.7   Total Bilirubin 0.0 - 1.2 mg/dL  0.7  0.8   Alkaline Phos 38 - 126 U/L  125   185   AST 15 - 41 U/L  30  39   ALT 0 - 44 U/L  16  20     Micro Results: No results found for this or any previous visit (from the past 240 hours). Studies/Results: DG Abd 1 View Result Date: 09/27/2023 EXAM: 1 VIEW XRAY OF THE ABDOMEN 09/27/2023 02:01:36 PM COMPARISON: None available. CLINICAL HISTORY: Pt had video capsule study yesterday, capsule did not reach cecum on images, rule out any retained capsule. FINDINGS: BOWEL: Nonobstructive bowel gas pattern. SOFT TISSUES: No opaque urinary calculi. Endoscopy capsule in right lower quadrant. TAVR and right chest power port noted. BONES: No acute osseous abnormality. Probable phleboliths in the pelvis. IMPRESSION: 1. Endoscopy capsule in the right lower quadrant, consistent with retained capsule. Electronically signed by: Rockey Kilts MD 09/27/2023 02:53 PM EDT RP Workstation: [id number]  Medications: I have reviewed the patient's current medications. Prior to Admission:  Medications Prior to Admission  Medication Sig Dispense Refill Last Dose/Taking   amLODipine  (NORVASC ) 10 MG tablet Take 1 tablet (10 mg total) by mouth daily. 90 tablet 3 09/24/2023   apixaban  (ELIQUIS ) 5 MG TABS tablet TAKE ONE (1) TABLET BY MOUTH TWO TIMES PER DAYTAKE ONE (1) TABLET BY MOUTH TWO TIMES PER DAY, may resume on Friday AM, June 28. 180 tablet 1 09/24/2023 Evening   atorvastatin  (LIPITOR) 80 MG tablet TAKE ONE TABLET BY MOUTH ONCE DAILY 30 tablet 3 09/24/2023   Cyanocobalamin (B-12) 3000 MCG CAPS Take 3,000 mcg by mouth daily as needed (energy).   Unknown   lidocaine -prilocaine  (EMLA ) cream Apply to affected area once 30 g 3 Unknown   metoprolol  succinate (TOPROL -XL) 25 MG 24 hr tablet TAKE ONE AND ONE-HALF TABLETS DAILY 45 tablet 3 09/25/2023   ondansetron  (ZOFRAN ) 8 MG tablet Take 1 tablet (8 mg total) by mouth every 8 (eight) hours as needed for nausea or vomiting. Start on the third day after cisplatin . 60 tablet 2 Unknown   oxyCODONE  (ROXICODONE ) 5 MG immediate  release tablet Take 1 tablet (5 mg total) by mouth every 6 (six) hours as needed. 90 tablet 0 Unknown   polyethylene glycol powder (GLYCOLAX /MIRALAX ) 17 GM/SCOOP powder Take 17 g by mouth as directed. 510 g 3 Unknown   prochlorperazine  (COMPAZINE ) 10 MG tablet Take 1 tablet (10 mg total) by mouth every 6 (six) hours as needed (Nausea or vomiting). 60 tablet 2 Unknown   senna (SENOKOT) 8.6 MG TABS tablet Take 2 tablets (17.2 mg total) by mouth 2 (two) times daily as needed for mild constipation or moderate constipation. 120 tablet 3 Unknown   traZODone  (DESYREL ) 50 MG tablet TAKE 1 TABLET BY MOUTH AT BEDTIME AS NEEDED FOR SLEEP 90 tablet 0 Unknown   amoxicillin  (AMOXIL ) 500 MG tablet Take 4 tablets (2,000 mg total) by mouth as directed. 1 hour prior to dental work including cleanings (Patient not taking: No sig reported) 12 tablet 12    Scheduled:  amLODipine   10 mg Oral Daily   atorvastatin   80 mg Oral Daily   Chlorhexidine  Gluconate Cloth  6 each Topical Daily   metoprolol  succinate  37.5 mg Oral Daily   pantoprazole  (PROTONIX ) IV  40 mg Intravenous Q12H   polyethylene glycol powder  119 g Oral Once   Continuous:  potassium chloride  10 mEq (09/27/23 1426)   PRN:acetaminophen  **OR** acetaminophen , ondansetron  **OR** ondansetron  (ZOFRAN ) IV, oxyCODONE , polyethylene glycol-electrolytes, sodium chloride  flush Anti-infectives (From admission, onward)    None      Scheduled Meds:  amLODipine   10 mg Oral Daily   atorvastatin   80 mg Oral Daily   Chlorhexidine  Gluconate Cloth  6 each Topical Daily   metoprolol  succinate  37.5 mg Oral Daily   pantoprazole  (PROTONIX ) IV  40 mg Intravenous Q12H   polyethylene glycol powder  119 g Oral Once   Continuous Infusions:  potassium chloride  10 mEq (09/27/23 1426)   PRN Meds:.acetaminophen  **OR** acetaminophen , ondansetron  **OR** ondansetron  (ZOFRAN ) IV, oxyCODONE , polyethylene glycol-electrolytes, sodium chloride  flush   Assessment: Principal  Problem:   GI bleeding Active Problems:   Primary hypertension   Pure hypercholesterolemia   History of stroke   Atrial fibrillation (HCC)   Peritoneal carcinomatosis (HCC)   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 Underlying lesions could not be identified  Plan: Recommend 1 unit of PRBCs Okay to start clear liquid diet ordered KUB as capsule did not reach cecum on capsule study images, capsule is still visible Discussed with patient and his family regarding push enteroscopy tomorrow, if unsuccessful, recommend transfer to tertiary care facility for balloon enteroscopy and he is agreeable to undergo the procedure Monitor CBC closely and transfuse to maintain Hb > 8 Continue to hold anticoagulation Half the amount of MiraLAX  bowel prep ordered  I have discussed alternative options, risks & benefits,  which include, but are not limited to, bleeding, infection, perforation,respiratory complication & drug reaction.  The patient agrees with this plan &  written consent will be obtained.      LOS: 2 days   Esbeidy Mclaine 09/27/2023, 3:09 PM

## 2023-09-27 NOTE — Progress Notes (Signed)
 1      PROGRESS NOTE    Jon Mejia  FMW:[id number] DOB: 10/11/1940 DOA: 09/25/2023 PCP: Jon Nancyann BRAVO, MD   Brief Narrative:   83 y.o. 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   1.  Acute upper GI bleeding - Status post EGD and colonoscopy by Dr. Onita on 8/29-it showed old blood and clot in the colon and in the terminal ileum.  Video capsule endoscopy undergoing.  Dr. Unk to follow tomorrow - Status post 2 PRBC transfusion.  Hemoglobin 7.5, will transfuse 1 PRBC per GI recommendation - Continue Protonix  IV twice a day - As per GI, he is likely bleeding in mid/distal small bowel, planning push enteroscopy tomorrow, he will need some bowel prep and blood  transfusion    2.  Stage IV cholangiocarcinoma - Received second cycle of chemo not completed - Family and patient understood and verbalized understand the instructions and poor prognosis - As needed pain medication.  Dr. Jacobo aware   3.  CAD, aortic stenosis s/p TAVR - Not able to take aspirin  or Eliquis  - Continue other home medications   4.  HTN/HLD - Continue home medications - Continue to monitor blood pressure  5.  Hypokalemia Replete and recheck     DVT prophylaxis: SCDs SCDs Start: 09/25/23 1319     Code Status: DNR Family Communication: Discussed with family at bedside Disposition Plan: Possible discharge in next 2 to 3 days depending on clinical condition and GI workup.  Push enteroscopy planned for tomorrow   Consultants:  GI  Procedures:  EGD and colonoscopy on 8/29 Capsule endoscopy Push enteroscopy     Subjective:  He denies any further bleeding but is certainly concerned with his hemoglobin trending down.  Family at bedside.  He would like to eat some  Objective: Vitals:   09/26/23 1925 09/27/23 0006 09/27/23 0346 09/27/23 0830  BP: 120/67 124/71 118/65 119/81  Pulse:  80 73 96  Resp: 18 19 19 18   Temp: 98.8 F (37.1 C) 99 F (37.2 C) 98.6 F (37 C) 98.4 F (36.9 C)  TempSrc:      SpO2: 98% 97% 98% 97%  Weight:      Height:        Intake/Output Summary (Last 24 hours) at 09/27/2023 1424 Last data filed at 09/27/2023 1023 Gross  per 24 hour  Intake 1 ml  Output --  Net 1 ml   Filed Weights   09/25/23 1343  Weight: 68.9 kg    Examination:  General exam: Appears calm and comfortable  Respiratory system: Clear to auscultation. Respiratory effort normal. Cardiovascular system: S1 & S2 heard, RRR. No JVD, murmurs, rubs, gallops or clicks. No pedal edema. Gastrointestinal system: Abdomen is soft, benign Central nervous system: Alert and oriented. No focal neurological deficits. Extremities: Symmetric 5 x 5 power. Skin: No  rashes, lesions or ulcers Psychiatry: Judgement and insight appear normal. Mood & affect appropriate.     Data Reviewed: I have personally reviewed following labs and imaging studies  CBC: Recent Labs  Lab 09/25/23 0855 09/25/23 1343 09/25/23 2249 09/26/23 0436 09/26/23 0928 09/27/23 0505  WBC 16.7* 15.2* 18.0* 14.4*  --  15.8*  NEUTROABS 13.5*  --   --   --   --   --   HGB 5.7* 5.6* 7.9* 6.6* 8.0* 7.5*  HCT 18.4* 17.3* 23.6* 19.4*  --  22.4*  MCV 90.2 89.2 87.1 87.0  --  86.8  PLT 304 276 275 223  --  210   Basic Metabolic Panel: Recent Labs  Lab 09/25/23 0855 09/26/23 0436 09/27/23 0505  NA 135 136 134*  K 3.8 3.5 3.4*  CL 104 105 105  CO2 21* 22 23  GLUCOSE 126* 95 92  BUN 24* 18 14  CREATININE 1.22 1.01 1.08  CALCIUM  8.4* 7.6* 7.8*  MG 1.9  --   --    GFR: Estimated Creatinine Clearance: 46.7 mL/min (by C-G formula based on SCr of 1.08 mg/dL). Liver Function Tests: Recent Labs  Lab 09/25/23 0855 09/26/23 0436  AST 39 30  ALT 20 16  ALKPHOS 185* 125  BILITOT 0.8 0.7  PROT 5.7* 4.4*  ALBUMIN 2.2* 1.7*   No results for input(s): LIPASE, AMYLASE in the last 168 hours. No results for input(s): AMMONIA in the last 168 hours. Coagulation Profile: Recent Labs  Lab 09/26/23 0436  INR 1.3*      Radiology Studies: No results found.      Scheduled Meds:  amLODipine   10 mg Oral Daily   atorvastatin   80 mg Oral Daily   Chlorhexidine  Gluconate Cloth  6 each Topical Daily   metoprolol  succinate  37.5 mg Oral Daily   pantoprazole  (PROTONIX ) IV  40 mg Intravenous Q12H   Continuous Infusions:  potassium chloride  10 mEq (09/27/23 1319)     LOS: 2 days    Time spent: 35 minutes    Jon Mira Maree, MD [facility] Pager [phone]  If 7PM-7AM, please contact night-coverage [email or web address]  09/27/2023, 2:24 PM

## 2023-09-28 ENCOUNTER — Encounter
Admission: AD | Disposition: A | Discharge status: Home / Self Care | Payer: Self-pay | Source: Ambulatory Visit | Attending: Internal Medicine

## 2023-09-28 ENCOUNTER — Encounter: Payer: Self-pay | Admitting: Hospitalist

## 2023-09-28 ENCOUNTER — Inpatient Hospital Stay: Admitting: Anesthesiology

## 2023-09-28 ENCOUNTER — Inpatient Hospital Stay

## 2023-09-28 DIAGNOSIS — C786 Secondary malignant neoplasm of retroperitoneum and peritoneum: Secondary | ICD-10-CM | POA: Diagnosis not present

## 2023-09-28 DIAGNOSIS — C787 Secondary malignant neoplasm of liver and intrahepatic bile duct: Secondary | ICD-10-CM | POA: Diagnosis not present

## 2023-09-28 DIAGNOSIS — I4891 Unspecified atrial fibrillation: Secondary | ICD-10-CM | POA: Diagnosis not present

## 2023-09-28 DIAGNOSIS — K922 Gastrointestinal hemorrhage, unspecified: Secondary | ICD-10-CM | POA: Diagnosis not present

## 2023-09-28 HISTORY — PX: ENTEROSCOPY: SHX5533

## 2023-09-28 LAB — TYPE AND SCREEN
ABO/RH(D): O POS
Antibody Screen: NEGATIVE
Unit division: 0
Unit division: 0
Unit division: 0
Unit division: 0

## 2023-09-28 LAB — CBC
HCT: 25.5 % — ABNORMAL LOW (ref 39.0–52.0)
Hemoglobin: 8.6 g/dL — ABNORMAL LOW (ref 13.0–17.0)
MCH: 29 pg (ref 26.0–34.0)
MCHC: 33.7 g/dL (ref 30.0–36.0)
MCV: 85.9 fL (ref 80.0–100.0)
Platelets: 182 K/uL (ref 150–400)
RBC: 2.97 MIL/uL — ABNORMAL LOW (ref 4.22–5.81)
RDW: 15.4 % (ref 11.5–15.5)
WBC: 17.8 K/uL — ABNORMAL HIGH (ref 4.0–10.5)
nRBC: 0 % (ref 0.0–0.2)

## 2023-09-28 LAB — BPAM RBC
Blood Product Expiration Date: 202509212359
Blood Product Expiration Date: 202509212359
Blood Product Expiration Date: 202509242359
Blood Product Expiration Date: 202509242359
ISSUE DATE / TIME: 202508281536
ISSUE DATE / TIME: 202508281756
ISSUE DATE / TIME: 202508290600
ISSUE DATE / TIME: 202508301721
Unit Type and Rh: 5100
Unit Type and Rh: 5100
Unit Type and Rh: 5100
Unit Type and Rh: 5100

## 2023-09-28 LAB — VITAMIN B12: Vitamin B-12: 538 pg/mL (ref 180–914)

## 2023-09-28 LAB — BASIC METABOLIC PANEL WITH GFR
Anion gap: 6 (ref 5–15)
BUN: 13 mg/dL (ref 8–23)
CO2: 23 mmol/L (ref 22–32)
Calcium: 7.5 mg/dL — ABNORMAL LOW (ref 8.9–10.3)
Chloride: 104 mmol/L (ref 98–111)
Creatinine, Ser: 0.89 mg/dL (ref 0.61–1.24)
GFR, Estimated: >60 mL/min (ref >60)
Glucose, Bld: 96 mg/dL (ref 70–99)
Potassium: 3.4 mmol/L — ABNORMAL LOW (ref 3.5–5.1)
Sodium: 133 mmol/L — ABNORMAL LOW (ref 135–145)

## 2023-09-28 LAB — HEMOGLOBIN AND HEMATOCRIT, BLOOD
HCT: 26.6 % — ABNORMAL LOW (ref 39.0–52.0)
HCT: 28.7 % — ABNORMAL LOW (ref 39.0–52.0)
Hemoglobin: 9.1 g/dL — ABNORMAL LOW (ref 13.0–17.0)
Hemoglobin: 9.9 g/dL — ABNORMAL LOW (ref 13.0–17.0)

## 2023-09-28 LAB — MAGNESIUM: Magnesium: 1.5 mg/dL — ABNORMAL LOW (ref 1.7–2.4)

## 2023-09-28 LAB — PHOSPHORUS: Phosphorus: 2.9 mg/dL (ref 2.5–4.6)

## 2023-09-28 SURGERY — ENTEROSCOPY
Anesthesia: General

## 2023-09-28 MED ORDER — DEXMEDETOMIDINE HCL IN NACL 200 MCG/50ML IV SOLN
INTRAVENOUS | Status: DC | PRN
Start: 2023-09-28 — End: 2023-09-28
  Administered 2023-09-28 (×2): 4 ug via INTRAVENOUS

## 2023-09-28 MED ORDER — SODIUM CHLORIDE 0.9 % IV SOLN: INTRAVENOUS | Status: DC

## 2023-09-28 MED ORDER — DEXMEDETOMIDINE HCL IN NACL 80 MCG/20ML IV SOLN
INTRAVENOUS | Status: AC
  Filled 2023-09-28: qty 20

## 2023-09-28 MED ORDER — MAGNESIUM SULFATE 2 GM/50ML IV SOLN
2.0000 g | Freq: Once | INTRAVENOUS | Status: AC
  Administered 2023-09-28: 2 g via INTRAVENOUS
  Filled 2023-09-28: qty 50

## 2023-09-28 MED ORDER — PROPOFOL 1000 MG/100ML IV EMUL
INTRAVENOUS | Status: AC
  Filled 2023-09-28: qty 100

## 2023-09-28 MED ORDER — POTASSIUM CHLORIDE CRYS ER 20 MEQ PO TBCR
40.0000 meq | EXTENDED_RELEASE_TABLET | Freq: Two times a day (BID) | ORAL | Status: AC
  Administered 2023-09-28 (×2): 40 meq via ORAL
  Filled 2023-09-28 (×2): qty 2

## 2023-09-28 MED ORDER — LIDOCAINE HCL (PF) 2 % IJ SOLN
INTRAMUSCULAR | Status: AC
  Filled 2023-09-28: qty 5

## 2023-09-28 MED ORDER — HEPARIN SOD (PORK) LOCK FLUSH 100 UNIT/ML IV SOLN
250.0000 [IU] | INTRAVENOUS | Status: DC | PRN
  Filled 2023-09-28: qty 5

## 2023-09-28 MED ORDER — PROPOFOL 10 MG/ML IV BOLUS
INTRAVENOUS | Status: DC | PRN
Start: 2023-09-28 — End: 2023-09-28
  Administered 2023-09-28: 125 ug/kg/min via INTRAVENOUS
  Administered 2023-09-28: 70 mg via INTRAVENOUS

## 2023-09-28 MED ORDER — ENSURE PLUS HIGH PROTEIN PO LIQD
237.0000 mL | Freq: Two times a day (BID) | ORAL | Status: DC
  Administered 2023-09-29: 237 mL via ORAL

## 2023-09-28 MED ORDER — SODIUM CHLORIDE 0.9% FLUSH: 10.0000 mL | INTRAVENOUS | Status: DC | PRN

## 2023-09-28 NOTE — Plan of Care (Signed)
 Pt went for procedure today. Has been sleeping ever since he got back. Awaiting word on whether or not he needs to be transferred to [facility] or not.

## 2023-09-28 NOTE — Op Note (Signed)
 [facility] Patient Name: Jon Mejia Procedure Date: 09/28/2023 8:58 AM MRN: [id number] Account #: [id number] Date of Birth: 08/09/1940 Admit Type: Inpatient Age: 83 Room: [facility] ENDO ROOM 4 Gender: Male Note Status: Finalized Instrument Name: Peds Colonoscope [id number] Procedure:             Small bowel enteroscopy Indications:           Iron deficiency anemia secondary to chronic blood                         loss, GI bleeding source not documented by previous                         EGD and colonoscopy, Obscure gastrointestinal bleeding Providers:             Corinn Jess Brooklyn MD, MD Referring MD:          Evalene PARAS. Jacobo, MD (Referring MD) Medicines:             General Anesthesia Complications:         No immediate complications. Estimated blood loss: None. Procedure:             Pre-Anesthesia Assessment:                        - Prior to the procedure, a History and Physical was                         performed, and patient medications and allergies were                         reviewed. The patient is competent. The risks and                         benefits of the procedure and the sedation options and                         risks were discussed with the patient. All questions                         were answered and informed consent was obtained.                         Patient identification and proposed procedure were                         verified by the physician, the nurse, the                         anesthesiologist, the anesthetist and the technician                         in the pre-procedure area in the procedure room in the                         endoscopy suite. Mental Status Examination: alert and                         oriented. Airway Examination: normal oropharyngeal  airway and neck mobility. Respiratory Examination:                         clear to auscultation. CV Examination: normal.                          Prophylactic Antibiotics: The patient does not require                         prophylactic antibiotics. Prior Anticoagulants: The                         patient has taken Eliquis  (apixaban ), last dose was 5                         days prior to procedure. ASA Grade Assessment: III - A                         patient with severe systemic disease. After reviewing                         the risks and benefits, the patient was deemed in                         satisfactory condition to undergo the procedure. The                         anesthesia plan was to use general anesthesia.                         Immediately prior to administration of medications,                         the patient was re-assessed for adequacy to receive                         sedatives. The heart rate, respiratory rate, oxygen                         saturations, blood pressure, adequacy of pulmonary                         ventilation, and response to care were monitored                         throughout the procedure. The physical status of the                         patient was re-assessed after the procedure.                        After obtaining informed consent, the endoscope was                         passed under direct vision. Throughout the procedure,                         the patient's blood pressure, pulse, and oxygen  saturations were monitored continuously. The                         Colonoscope was introduced through the mouth and                         advanced to the distal jejunum. The small bowel                         enteroscopy was accomplished without difficulty. The                         patient tolerated the procedure well. Findings:      There was no evidence of significant pathology at 180 cm (from the       incisors) and in the entire examined portion of jejunum. Area was       tattooed with an injection of Spot (carbon black)  to the extent reached.       Estimated blood loss: none.      The esophagus was normal.      The stomach was normal.      The examined duodenum was normal. Impression:            - The examined portion of the jejunum was normal.                         Tattooed.                        - Normal esophagus.                        - Normal stomach.                        - Normal examined duodenum.                        - No specimens collected. Recommendation:        - Return patient to hospital ward for ongoing care.                        - Resume previous diet today.                        - Monitor CBC closely                        - Ok to resume eliquis  if patient is deemeed high risk                         for stroke/cardioembolic events                        - Recommend balloon enteroscopy at a tertiary care                         facility if rebleeding occurs Procedure Code(s):     --- Professional ---                        [phone], Small intestinal endoscopy, enteroscopy  beyond                         second portion of duodenum, not including ileum;                         diagnostic, including collection of specimen(s) by                         brushing or washing, when performed (separate                         procedure)                        [id number], Unlisted procedure, small intestine Diagnosis Code(s):     --- Professional ---                        D50.0, Iron deficiency anemia secondary to blood loss                         (chronic)                        K92.2, Gastrointestinal hemorrhage, unspecified CPT copyright 2022 American Medical Association. All rights reserved. The codes documented in this report are preliminary and upon coder review may  be revised to meet current compliance requirements. Dr. Corinn Brooklyn Corinn Jess Brooklyn MD, MD 09/28/2023 9:52:55 AM This report has been signed electronically. Number of Addenda: 0 Note Initiated On: 09/28/2023 8:58  AM Estimated Blood Loss:  Estimated blood loss: none.      [facility]

## 2023-09-28 NOTE — Plan of Care (Signed)

## 2023-09-28 NOTE — Transfer of Care (Signed)
 Immediate Anesthesia Transfer of Care Note  Patient: NEAL TRULSON  Procedure(s) Performed: ENTEROSCOPY  Patient Location: Endoscopy Unit  Anesthesia Type:General  Level of Consciousness: awake, alert , and oriented  Airway & Oxygen Therapy: Patient Spontanous Breathing  Post-op Assessment: Report given to RN and Post -op Vital signs reviewed and stable  Post vital signs: Reviewed and stable  Last Vitals:  Vitals Value Taken Time  BP 104/44 09/28/23 09:55  Temp 36.9 C 09/28/23 09:45  Pulse 82 09/28/23 09:55  Resp 18 09/28/23 09:55  SpO2 95 % 09/28/23 09:55    Last Pain:  Vitals:   09/28/23 0955  TempSrc:   PainSc: 0-No pain      Patients Stated Pain Goal: 0 (09/27/23 2028)  Complications: No notable events documented.

## 2023-09-28 NOTE — Consult Note (Signed)
 PHARMACY CONSULT NOTE - ELECTROLYTES  Pharmacy Consult for Electrolyte Monitoring and Replacement   Recent Labs: Potassium (mmol/L)  Date Value  09/28/2023 3.4 (L)   Magnesium  (mg/dL)  Date Value  91/68/7974 1.5 (L)   Calcium  (mg/dL)  Date Value  91/68/7974 7.5 (L)   Albumin (g/dL)  Date Value  91/70/7974 1.7 (L)  07/07/2023 4.0   Phosphorus (mg/dL)  Date Value  91/68/7974 2.9   Sodium (mmol/L)  Date Value  09/28/2023 133 (L)  07/07/2023 136   Height: 5' 5 (165.1 cm) Weight: 68.9 kg (152 lb) IBW/kg (Calculated) : 61.5 Estimated Creatinine Clearance: 56.6 mL/min (by C-G formula based on SCr of 0.89 mg/dL).  Assessment  Jon Mejia is a 83 y.o. male presenting with GIB. 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 Pharmacy has been consulted to monitor and replace electrolytes.  Diet: NPO MIVF: N/A Pertinent medications: N/A  Goal of Therapy: Electrolytes within normal limits  Plan:  Mg 2 g IV x 1  Kcl 40 mEq x 2 F/u with AM labs.   Thank you for allowing pharmacy to be a part of this patient's care.  Cathaleen GORMAN Blanch, PharmD, BCPS 09/28/2023 7:09 AM

## 2023-09-28 NOTE — Anesthesia Postprocedure Evaluation (Signed)
 Anesthesia Post Note  Patient: Jon Mejia  Procedure(s) Performed: ENTEROSCOPY  Patient location during evaluation: PACU Anesthesia Type: General Level of consciousness: awake and alert Pain management: pain level controlled Vital Signs Assessment: post-procedure vital signs reviewed and stable Respiratory status: spontaneous breathing, nonlabored ventilation, respiratory function stable and patient connected to nasal cannula oxygen Cardiovascular status: blood pressure returned to baseline and stable Postop Assessment: no apparent nausea or vomiting Anesthetic complications: no   No notable events documented.   Last Vitals:  Vitals:   09/28/23 1033 09/28/23 1121  BP: 109/70 122/75  Pulse: (!) 127 66  Resp:  18  Temp: 36.9 C 36.7 C  SpO2: 97% 100%    Last Pain:  Vitals:   09/28/23 1121  TempSrc: Oral  PainSc:                  [name]

## 2023-09-28 NOTE — Progress Notes (Signed)
 1      PROGRESS NOTE    DELDRICK LINCH  FMW:[id number] DOB: 11-Aug-1940 DOA: 09/25/2023 PCP: Gasper Nancyann BRAVO, MD   Brief Narrative:   83 y.o. 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  Patient was found to be severely anemic at 5.7 hemoglobin, complaining of black tarry stool with history of recent multiple blood transfusion for the same, referred directly by oncologist for transfusion and GI workup   8/29: GI c/s - EGD & C-scope did not show any bleed, capsule endoscopy pending 8/30: KUB, GI planning push enteroscopy tomorrow, 1 PRBC transfusion 8/31: push enteroscopy neg.    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   1.  Acute upper GI bleeding - Status post EGD and colonoscopy by Dr. Onita on 8/29-it showed old blood and clot in the colon and in the terminal ileum.  Video capsule endoscopy suggestive possible mid/distal small bowel bleed.  Dr. Unk seen - Status post 2 PRBC transfusion.  Hemoglobin 7.5, will transfuse 1 PRBC per GI recommendation - Continue Protonix  IV twice a day - His enteroscopy is negative, unable to find bleeding source. Started on full  liquids and if he continues to bleed, he will need to be transferred to [facility] for balloon enteroscopy per GI. Will monitor H & H Q 8 for now.   2.  Stage IV cholangiocarcinoma - Received second cycle of chemo not completed - Family and patient understood and verbalized understand the instructions and poor prognosis - As needed pain medication.  Dr. Jacobo aware   3.  CAD, aortic stenosis s/p TAVR - Not able to take aspirin  or Eliquis  - Continue other home medications   4.  HTN/HLD - Continue home medications - Continue to monitor blood pressure  5.  Hypokalemia Replete and recheck     DVT prophylaxis: SCDs SCDs Start: 09/25/23 1319     Code Status: DNR Family Communication: Discussed with family at bedside Disposition Plan: Possible discharge in next 2 to 3 days depending on clinical condition and GI workup.  If bleeds more, may need transfer to [facility] per GI   Consultants:  GI  Procedures:  EGD and colonoscopy on 8/29 Capsule endoscopy Push enteroscopy on 8/31     Subjective:  Denies any s/s - just came back from enteroscopy and is neg. Family at bedside  Objective: Vitals:   09/28/23 0955 09/28/23 1000 09/28/23 1033 09/28/23 1121  BP: (!) 104/44 105/63 109/70 122/75  Pulse: 82 85 (!) 127 66  Resp: 18 20  18   Temp:   98.4 F (36.9 C) 98 F (36.7 C)  TempSrc:    Oral  SpO2: 95%  97% 100%  Weight:      Height:  Intake/Output Summary (Last 24 hours) at 09/28/2023 1312 Last data filed at 09/28/2023 0944 Gross per 24 hour  Intake 1396 ml  Output 0 ml  Net 1396 ml   Filed Weights   09/25/23 1343  Weight: 68.9 kg    Examination:  General exam: Appears calm and comfortable  Respiratory system: Clear to auscultation. Respiratory effort normal. Cardiovascular system: S1 & S2 heard, RRR. No JVD, murmurs, rubs, gallops or clicks. No pedal edema. Gastrointestinal system: Abdomen is soft, benign Central nervous system: Alert and oriented. No focal  neurological deficits. Extremities: Symmetric 5 x 5 power. Skin: No rashes, lesions or ulcers Psychiatry: Judgement and insight appear normal. Mood & affect appropriate.     Data Reviewed: I have personally reviewed following labs and imaging studies  CBC: Recent Labs  Lab 09/25/23 0855 09/25/23 0855 09/25/23 1343 09/25/23 2249 09/26/23 0436 09/26/23 0928 09/27/23 0505 09/27/23 2200 09/28/23 0445 09/28/23 1150  WBC 16.7*  --  15.2* 18.0* 14.4*  --  15.8*  --  17.8*  --   NEUTROABS 13.5*  --   --   --   --   --   --   --   --   --   HGB 5.7*   < > 5.6* 7.9* 6.6* 8.0* 7.5* 9.0* 8.6* 9.1*  HCT 18.4*  --  17.3* 23.6* 19.4*  --  22.4* 26.2* 25.5* 26.6*  MCV 90.2  --  89.2 87.1 87.0  --  86.8  --  85.9  --   PLT 304  --  276 275 223  --  210  --  182  --    < > = values in this interval not displayed.   Basic Metabolic Panel: Recent Labs  Lab 09/25/23 0855 09/26/23 0436 09/27/23 0505 09/28/23 0445  NA 135 136 134* 133*  K 3.8 3.5 3.4* 3.4*  CL 104 105 105 104  CO2 21* 22 23 23   GLUCOSE 126* 95 92 96  BUN 24* 18 14 13   CREATININE 1.22 1.01 1.08 0.89  CALCIUM  8.4* 7.6* 7.8* 7.5*  MG 1.9  --   --  1.5*  PHOS  --   --   --  2.9   GFR: Estimated Creatinine Clearance: 56.6 mL/min (by C-G formula based on SCr of 0.89 mg/dL). Liver Function Tests: Recent Labs  Lab 09/25/23 0855 09/26/23 0436  AST 39 30  ALT 20 16  ALKPHOS 185* 125  BILITOT 0.8 0.7  PROT 5.7* 4.4*  ALBUMIN 2.2* 1.7*   No results for input(s): LIPASE, AMYLASE in the last 168 hours. No results for input(s): AMMONIA in the last 168 hours. Coagulation Profile: Recent Labs  Lab 09/26/23 0436  INR 1.3*      Radiology Studies: DG Abd 1 View Result Date: 09/27/2023 EXAM: 1 VIEW XRAY OF THE ABDOMEN 09/27/2023 02:01:36 PM COMPARISON: None available. CLINICAL HISTORY: Pt had video capsule study yesterday, capsule did not reach cecum on images, rule out any retained capsule. FINDINGS: BOWEL:  Nonobstructive bowel gas pattern. SOFT TISSUES: No opaque urinary calculi. Endoscopy capsule in right lower quadrant. TAVR and right chest power port noted. BONES: No acute osseous abnormality. Probable phleboliths in the pelvis. IMPRESSION: 1. Endoscopy capsule in the right lower quadrant, consistent with retained capsule. Electronically signed by: Rockey Kilts MD 09/27/2023 02:53 PM EDT RP Workstation: [id number]        Scheduled Meds:  amLODipine   10 mg Oral Daily   atorvastatin   80 mg Oral Daily   Chlorhexidine   Gluconate Cloth  6 each Topical Daily   feeding supplement  237 mL Oral BID BM   metoprolol  succinate  37.5 mg Oral Daily   pantoprazole  (PROTONIX ) IV  40 mg Intravenous Q12H   potassium chloride   40 mEq Oral BID   Continuous Infusions:     LOS: 3 days    Time spent: 35 minutes    Cresencio Fairly, MD [facility] Pager [phone]  If 7PM-7AM, please contact night-coverage [email or web address]  09/28/2023, 1:12 PM

## 2023-09-28 NOTE — Progress Notes (Signed)
   09/28/23 1033  Assess: MEWS Score  Temp 98.4 F (36.9 C)  BP 109/70  MAP (mmHg) 83  Pulse Rate (!) 127  SpO2 97 %  O2 Device Room Air  Assess: MEWS Score  MEWS Temp 0  MEWS Systolic 0  MEWS Pulse 2  MEWS RR 0  MEWS LOC 0  MEWS Score 2  MEWS Score Color Yellow  Assess: if the MEWS score is Yellow or Red  Were vital signs accurate and taken at a resting state? No, vital signs rechecked  Does the patient meet 2 or more of the SIRS criteria? No  MEWS guidelines implemented   (reassessing vitals)  Notify: Charge Nurse/RN  Name of Charge Nurse/RN Notified Robyn  Assess: SIRS CRITERIA  SIRS Temperature  0  SIRS Respirations  0  SIRS Pulse 1  SIRS WBC 0  SIRS Score Sum  1   Vital signs reassessed. HR came down to 67. Initial 127 HR was taken right after coming back to floor from PACU

## 2023-09-28 NOTE — Anesthesia Procedure Notes (Signed)
 Date/Time: 09/28/2023 9:16 AM  Performed by: Dominica Krabbe, CRNAPre-anesthesia Checklist: Patient identified, Emergency Drugs available, Patient being monitored, Timeout performed and Suction available Patient Re-evaluated:Patient Re-evaluated prior to induction Oxygen Delivery Method: Nasal cannula Preoxygenation: Pre-oxygenation with 100% oxygen Induction Type: IV induction

## 2023-09-28 NOTE — Anesthesia Preprocedure Evaluation (Signed)
 Anesthesia Evaluation  Patient identified by MRN, date of birth, ID band Patient awake    Reviewed: Allergy & Precautions, NPO status , Patient's Chart, lab work & pertinent test results  History of Anesthesia Complications Negative for: history of anesthetic complications  Airway Mallampati: III  TM Distance: >3 FB Neck ROM: full    Dental  (+) Upper Dentures, Dental Advidsory Given   Pulmonary neg pulmonary ROS, Patient abstained from smoking., former smoker   Pulmonary exam normal  + decreased breath sounds      Cardiovascular Exercise Tolerance: Poor hypertension, (-) angina + CAD  (-) Past MI and (-) Cardiac Stents Normal cardiovascular exam+ dysrhythmias Atrial Fibrillation + Valvular Problems/Murmurs (s/p AVR)  Rhythm:Regular     Neuro/Psych neg Seizures CVA, Residual Symptoms  negative psych ROS   GI/Hepatic Neg liver ROS,GERD  Medicated,,  Endo/Other  negative endocrine ROS    Renal/GU negative Renal ROS  negative genitourinary   Musculoskeletal   Abdominal Normal abdominal exam  (+)   Peds  Hematology negative hematology ROS (+)   Anesthesia Other Findings Past Medical History: No date: Angina pectoris (HCC) No date: Aortic atherosclerosis (HCC) No date: Aortic stenosis     Comment:  a.) TTE 03/14/2018: mod AS (MPG 14); b.) TTE 09/13/2019:              mild AS (MPG 12.5); c.) TTE 08/16/2021: mod AS (MPG               18.5); d.) R/LHC 09/17/2021: mRA 3, mPA 19, mPCWP 11,               LVEDP 15, CO 5.22, CI 2.75; e.) s/p TAVR 11/06/2021 09/17/2021: CAD (coronary artery disease)     Comment:  a.) R/LHC 09/17/2021: 30% pRCA, 40% dRCA, 50% pLCx, 30%               dLCx, 50% pLAD, 50% mLAD, 50% dLAD --> med mgmt No date: Cardiac murmur No date: Cervical spondylosis No date: Cholelithiasis 03/13/2018: CRAO (central retinal artery occlusion), 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: CVA (cerebral vascular accident) [facility])      Comment:  a.) MRI brain 03/14/2018 --> acute infarct in the               posterior RIGHT frontal lobe; small chronic cerebellar               infarct --> Tx'd with TPA with (+) improvement/resolution              of symptoms No date: Diastolic dysfunction     Comment:  a.) TTE 01/31/2015: EF 55-60%, MAC, G1DD; b.) TTE               03/14/2018: EF 60-65%, mild LAE, sev MAC, sev AoV cal               with mod AS (MPG 14) G2DD; c.) TTE 09/13/2019: EF 55-60%,              mild LAE, mild-mod MR, mild AS (MPG 12.5), G2DD; d.) TTE               08/16/2021: EF 60-65%, mild LVH, mod MAC, mild MR, mod AS              (MPG 18.5), G1DD; e.) TTE 12/05/2021: EF 60-65%, mod MAC,              s/p TAVR (well seated/functioning valve), mild MR, G1DD No date: GERD (gastroesophageal reflux disease)  No date: History of chicken pox No date: History of measles No date: Hyperlipidemia No date: Hypertension No date: LBBB (left bundle branch block) No date: Long term current use of anticoagulant     Comment:  a.) apixaban  No date: Low back pain radiating to left lower extremity No date: PAF (paroxysmal atrial fibrillation) (HCC)     Comment:  a.) CHA2DS2VASc = 99 (age x2, HTN, CVA x2, vascular               disease history);  b.) rate/rhythm maintained on oral               metoprolol  succinate; chronically anticoagulated with               apixaban  11/06/2021: S/P TAVR (transcatheter aortic valve replacement)     Comment:  a.) 26mm S3UR via RIGHT TF approach No date: Skin cancer  Past Surgical History: 11/06/2021: INTRAOPERATIVE TRANSTHORACIC ECHOCARDIOGRAM; N/A     Comment:  Procedure: INTRAOPERATIVE TRANSTHORACIC ECHOCARDIOGRAM;               Surgeon: Verlin Lonni BIRCH, MD;  Location: [facility] OR;                Service: Open Heart Surgery;  Laterality: N/A; 08/07/2023: IR IMAGING GUIDED PORT INSERTION 09/17/2021: RIGHT/LEFT HEART CATH AND CORONARY ANGIOGRAPHY; N/A     Comment:  Procedure: RIGHT/LEFT  HEART CATH AND CORONARY               ANGIOGRAPHY;  Surgeon: Verlin Lonni BIRCH, MD;                Location: [facility] INVASIVE CV LAB;  Service: Cardiovascular;                Laterality: N/A; 07/24/2022: ROBOTIC ASSISTED LAPAROSCOPIC CHOLECYSTECTOMY 07/2020: SKIN CANCER EXCISION 11/06/2021: TRANSCATHETER AORTIC VALVE REPLACEMENT, TRANSFEMORAL; N/A     Comment:  Procedure: Transcatheter Aortic Valve Replacement,               Transfemoral;  Surgeon: Verlin Lonni BIRCH, MD;                Location: [facility] OR;  Service: Open Heart Surgery;                Laterality: N/A;  BMI    Body Mass Index: 25.29 kg/m      Reproductive/Obstetrics negative OB ROS                              Anesthesia Physical Anesthesia Plan  ASA: 3  Anesthesia Plan: General   Post-op Pain Management:    Induction: Intravenous  PONV Risk Score and Plan: Propofol  infusion and TIVA  Airway Management Planned: Natural Airway and Nasal Cannula  Additional Equipment:   Intra-op Plan:   Post-operative Plan:   Informed Consent: I have reviewed the patients History and Physical, chart, labs and discussed the procedure including the risks, benefits and alternatives for the proposed anesthesia with the patient or authorized representative who has indicated his/her understanding and acceptance.     Dental Advisory Given  Plan Discussed with: CRNA  Anesthesia Plan Comments:          Anesthesia Quick Evaluation

## 2023-09-29 ENCOUNTER — Inpatient Hospital Stay

## 2023-09-29 DIAGNOSIS — C786 Secondary malignant neoplasm of retroperitoneum and peritoneum: Secondary | ICD-10-CM | POA: Diagnosis not present

## 2023-09-29 DIAGNOSIS — K922 Gastrointestinal hemorrhage, unspecified: Secondary | ICD-10-CM | POA: Diagnosis not present

## 2023-09-29 DIAGNOSIS — Z8673 Personal history of transient ischemic attack (TIA), and cerebral infarction without residual deficits: Secondary | ICD-10-CM | POA: Diagnosis not present

## 2023-09-29 DIAGNOSIS — C772 Secondary and unspecified malignant neoplasm of intra-abdominal lymph nodes: Secondary | ICD-10-CM | POA: Diagnosis not present

## 2023-09-29 DIAGNOSIS — C787 Secondary malignant neoplasm of liver and intrahepatic bile duct: Secondary | ICD-10-CM | POA: Diagnosis not present

## 2023-09-29 LAB — RENAL FUNCTION PANEL
Albumin: 1.6 g/dL — ABNORMAL LOW (ref 3.5–5.0)
Anion gap: 6 (ref 5–15)
BUN: 12 mg/dL (ref 8–23)
CO2: 25 mmol/L (ref 22–32)
Calcium: 7.6 mg/dL — ABNORMAL LOW (ref 8.9–10.3)
Chloride: 102 mmol/L (ref 98–111)
Creatinine, Ser: 0.97 mg/dL (ref 0.61–1.24)
GFR, Estimated: >60 mL/min (ref >60)
Glucose, Bld: 102 mg/dL — ABNORMAL HIGH (ref 70–99)
Phosphorus: 2.6 mg/dL (ref 2.5–4.6)
Potassium: 4.1 mmol/L (ref 3.5–5.1)
Sodium: 133 mmol/L — ABNORMAL LOW (ref 135–145)

## 2023-09-29 LAB — MAGNESIUM: Magnesium: 2.1 mg/dL (ref 1.7–2.4)

## 2023-09-29 LAB — HEMOGLOBIN AND HEMATOCRIT, BLOOD
HCT: 25.3 % — ABNORMAL LOW (ref 39.0–52.0)
HCT: 27.9 % — ABNORMAL LOW (ref 39.0–52.0)
Hemoglobin: 8.6 g/dL — ABNORMAL LOW (ref 13.0–17.0)
Hemoglobin: 9.3 g/dL — ABNORMAL LOW (ref 13.0–17.0)

## 2023-09-29 MED ORDER — ENSURE PLUS HIGH PROTEIN PO LIQD: 237.0000 mL | Freq: Three times a day (TID) | ORAL | Status: DC

## 2023-09-29 MED ORDER — HEPARIN SOD (PORK) LOCK FLUSH 100 UNIT/ML IV SOLN
500.0000 [IU] | INTRAVENOUS | Status: AC
  Administered 2023-09-29: 500 [IU] via INTRAVENOUS

## 2023-09-29 MED ORDER — IOHEXOL 300 MG/ML  SOLN
100.0000 mL | Freq: Once | INTRAMUSCULAR | Status: AC | PRN
  Administered 2023-09-29: 100 mL via INTRAVENOUS

## 2023-09-29 MED ORDER — ADULT MULTIVITAMIN W/MINERALS CH
1.0000 | ORAL_TABLET | Freq: Every day | ORAL | Status: DC
  Administered 2023-09-29: 1 via ORAL
  Filled 2023-09-29: qty 1

## 2023-09-29 NOTE — Discharge Summary (Signed)
 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  Patient was found to be severely anemic at 5.7 hemoglobin, complaining of black tarry stool with history of recent multiple blood transfusion for the same, referred directly by oncologist for transfusion and GI workup    8/29: GI c/s - EGD & C-scope did not show any bleed, capsule endoscopy pending 8/30: KUB, GI planning push enteroscopy tomorrow, 1 PRBC transfusion 8/31: push enteroscopy neg.  9/1: CT entero Abd-Pelvis       1.  Acute upper GI bleeding - Status post EGD and colonoscopy by Dr. Onita on 8/29-it showed old blood and clot in the colon and in the terminal ileum.  Video capsule endoscopy not showing clear site of bleed and is stuck in cecum area seen on KUB. Push enteroscopy neg - Status post 3 PRBC transfusion.   - CT Entero Abd/Pelvis on 9/1 showed New irregular 3.9 x 2.6 cm small bowel mass within a right abdominal small bowel loop compatible with small bowel metastasis, likely the  source of GI bleeding. No active GI bleed on CT. Interval progression of advanced liver metastatic disease. Innumerable new lung metastases at the lung bases. Mixed changes in the peritoneal metastatic disease, with decreased omental caking, but new/increased metastatic implants in the mesentery and right pelvic sidewall. New metastatic adenopathy to the porta hepatis.   2.  Stage IV cholangiocarcinoma - Received second cycle of chemo not completed - Family and patient understood and verbalized understand the instructions and poor prognosis - Dr. Jacobo aware   3.  CAD, aortic stenosis s/p TAVR - Not able to take aspirin  or Eliquis    4.  HTN/HLD   5.  Hypokalemia Replete and recheck   6. GOC With metastatic dz - likely poor prognosis. Family is aware and leaning towards Hospice. He has appt with Dr Jon tomorrow and will be further discussed there.       Consultants: GI Procedures performed: EGD, C-Scope, Video Capsule Endoscopy, Push Enteroscopy  Disposition: Home Diet recommendation:  Discharge Diet Orders (From admission, onward)     Start     Ordered   09/29/23 0000  Diet - low sodium heart healthy        09/29/23 1246           Carb modified diet DISCHARGE MEDICATION: Allergies as of 09/29/2023   No Known Allergies      Medication List     STOP taking these medications    amoxicillin  500 MG tablet Commonly known as: AMOXIL    apixaban  5 MG Tabs tablet Commonly known as: Eliquis   TAKE these medications    amLODipine  10 MG tablet Commonly known as: NORVASC  Take 1 tablet (10 mg total) by mouth daily.   atorvastatin  80 MG tablet Commonly known as: LIPITOR TAKE ONE TABLET BY MOUTH ONCE DAILY   B-12 3000 MCG Caps Take 3,000 mcg by mouth daily as needed (energy).   lidocaine -prilocaine  cream Commonly known as: EMLA  Apply to affected area once   metoprolol  succinate 25 MG 24 hr tablet Commonly known as: TOPROL -XL TAKE ONE AND ONE-HALF  TABLETS DAILY   ondansetron  8 MG tablet Commonly known as: Zofran  Take 1 tablet (8 mg total) by mouth every 8 (eight) hours as needed for nausea or vomiting. Start on the third day after cisplatin .   oxyCODONE  5 MG immediate release tablet Commonly known as: Roxicodone  Take 1 tablet (5 mg total) by mouth every 6 (six) hours as needed.   polyethylene glycol powder 17 GM/SCOOP powder Commonly known as: GLYCOLAX /MIRALAX  Take 17 g by mouth as directed.   prochlorperazine  10 MG tablet Commonly known as: COMPAZINE  Take 1 tablet (10 mg total) by mouth every 6 (six) hours as needed (Nausea or vomiting).   senna 8.6 MG Tabs tablet Commonly known as: SENOKOT Take 2 tablets (17.2 mg total) by mouth 2 (two) times daily as needed for mild constipation or moderate constipation.   traZODone  50 MG tablet Commonly known as: DESYREL  TAKE 1 TABLET BY MOUTH AT BEDTIME AS NEEDED FOR SLEEP        Follow-up Information     Jon Nancyann BRAVO, MD Follow up.   Specialty: Family Medicine Why: hospital follow up  Office closed patient to make own follow up appt Contact information: [address] [place] [address] [phone]         Jon Evalene PARAS, MD. Go on 09/30/2023.   Specialty: Oncology Why: as scheduled at 8:30 am Contact information: [address] [place] [address] [phone]                Discharge Exam: Jon Mejia   09/25/23 1343  Weight: 68.9 kg   General exam: Appears calm and comfortable  Respiratory system: Clear to auscultation. Respiratory effort normal. Cardiovascular system: S1 & S2 heard, RRR. No JVD, murmurs, rubs, gallops or clicks. No pedal edema. Gastrointestinal system: Abdomen is soft, benign Central nervous system: Alert and oriented. No focal neurological deficits. Extremities: Symmetric 5 x 5 power. Skin: No rashes, lesions or ulcers Psychiatry: Judgement and insight appear normal. Mood & affect appropriate.    Condition at discharge: poor  The results of significant diagnostics from this hospitalization (including imaging, microbiology, ancillary and laboratory) are listed below for reference.   Imaging Studies: CT ENTERO ABD/PELVIS W CONTAST Result Date: 09/29/2023 EXAM: CT ABDOMEN AND PELVIS WITH CONTRAST (ENTEROGRAPHY) 09/29/2023 11:04:29 AM TECHNIQUE: CT of the abdomen and pelvis was performed after the administration of intravenous contrast and negative oral contrast. Automated exposure control, iterative reconstruction, and/or weight based adjustment of the mA/kV was utilized to reduce the radiation dose to as low as reasonably achievable. COMPARISON: 07/10/2023 CT abdomen/pelvis, 09/28/2023 abdominal radiograph, and 07/15/2023 PET/CT. CLINICAL HISTORY: GI bleed. 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 FINDINGS: LOWER CHEST: Numerous (greater than 10) solid pulmonary nodules scattered throughout both lung bases, new since 07/10/2023 CT. Largest 0.9 cm in the right lower lobe on series 4 image 9 and 1.0 cm in the medial left upper lobe on image 2. LIVER: Innumerable (greater than 20) hypodense liver masses scattered throughout  the liver, increased in size and number. Representative 3.4 x 3.3 cm posterior right liver mass on series 2 image 24, increased from 1.6 x 1.5 cm. Representative 2.3 x 1.9 cm anterior left liver mass on image 22, increased from 1.0 x 0.9 cm. GALLBLADDER AND BILE DUCTS: Cholecystectomy. No biliary ductal dilatation. SPLEEN: No acute abnormality. PANCREAS: No acute abnormality. ADRENAL GLANDS: No acute abnormality. KIDNEYS, URETERS AND BLADDER: No stones in the kidneys or ureters. No hydronephrosis. No perinephric or periureteral stranding. Urinary bladder is unremarkable. STOMACH AND BOWEL: New irregular 3.9 x 2.6 cm small bowel mass within a right abdominal small bowel loop on series 2 image 69. No dilated small bowel loops. Diminutive appendix. No large bowel wall  thickening or diverticulosis. Metallic density 2.0 x 1.4 cm structure within the mid rectal lumen on series 5 image 84. No active intraluminal contrast extravasation. PERITONEUM AND RETROPERITONEUM: Omental soft tissue caking is substantially decreased with dominant residual 1.7 x 1.3 cm right omental nodule on series 2 image 54. New mesenteric soft tissue implants, largest 1.8 x 1.6 cm on image 67. Upper right pelvic sidewall 2.5 x 2.3 cm implant on image 74, increased from 1.1 x 0.9 cm. VASCULATURE: Atherosclerotic nonaneurysmal abdominal aorta. Superior approach central venous catheter tip seen at the cavoatrial junction. LYMPH NODES: Newly mildly enlarged 1.1 cm porta hepatis lymph node noted on series 2 image 33. REPRODUCTIVE ORGANS: Stable mild prostatomegaly. BONES AND SOFT TISSUES: Moderate thoracolumbar spondylosis. IMPRESSION: 1. New irregular 3.9 x 2.6 cm small bowel mass within a right abdominal small bowel loop compatible with small bowel metastasis, likely the source of GI bleeding. No active GI bleed on CT. 2. Interval progression of advanced liver metastatic disease. 3. Innumerable new lung metastases at the lung bases. 4. Mixed changes in the peritoneal metastatic disease, with decreased omental caking, but new/increased metastatic implants in the mesentery and right pelvic sidewall. 5. New metastatic adenopathy to the porta hepatis. Electronically signed by: Selinda Blue MD 09/29/2023 11:37 AM EDT RP Workstation: [id number]   DG Abd 1 View Result Date: 09/28/2023 CLINICAL DATA:  Nausea. EXAM: ABDOMEN - 1 VIEW COMPARISON:  Radiograph yesterday FINDINGS: Retained endoscopy capsule projects over the right pelvis, unchanged. No small bowel distension or evidence of obstruction. Mild gaseous distension of transverse colon. No significant formed colonic stool. TAVR partially included. Vascular calcifications are seen. IMPRESSION: 1. Retained endoscopy capsule projects over the right pelvis, unchanged.  2. Mild gaseous distension of transverse colon. No evidence of bowel obstruction. Electronically Signed   By: Andrea Gasman M.D.   On: 09/28/2023 23:45   DG Abd 1 View Result Date: 09/27/2023 EXAM: 1 VIEW XRAY OF THE ABDOMEN 09/27/2023 02:01:36 PM COMPARISON: None available. CLINICAL HISTORY: Pt had video capsule study yesterday, capsule did not reach cecum on images, rule out any retained capsule. FINDINGS: BOWEL: Nonobstructive bowel gas pattern. SOFT TISSUES: No opaque urinary calculi. Endoscopy capsule in right lower quadrant. TAVR and right chest power port noted. BONES: No acute osseous abnormality. Probable phleboliths in the pelvis. IMPRESSION: 1. Endoscopy capsule in the right lower quadrant, consistent with retained capsule. Electronically signed by: Rockey Kilts MD 09/27/2023 02:53 PM EDT RP Workstation: [id number]    Microbiology: Results for orders placed or performed during the hospital encounter of 11/02/21  SARS CORONAVIRUS 2 (TAT 6-24 HRS) Anterior Nasal Swab     Status: None   Collection Time: 11/02/21  1:07 PM   Specimen: Anterior Nasal Swab  Result Value Ref Range Status  SARS Coronavirus 2 NEGATIVE NEGATIVE Final    Comment: (NOTE) SARS-CoV-2 target nucleic acids are NOT DETECTED.  The SARS-CoV-2 RNA is generally detectable in upper and lower respiratory specimens during the acute phase of infection. Negative results do not preclude SARS-CoV-2 infection, do not rule out co-infections with other pathogens, and should not be used as the sole basis for treatment or other patient management decisions. Negative results must be combined with clinical observations, patient history, and epidemiological information. The expected result is Negative.  Fact Sheet for Patients: HairSlick.no  Fact Sheet for Healthcare Providers: quierodirigir.com  This test is not yet approved or cleared by the [place]  FDA and  has  been authorized for detection and/or diagnosis of SARS-CoV-2 by FDA under an Emergency Use Authorization (EUA). This EUA will remain  in effect (meaning this test can be used) for the duration of the COVID-19 declaration under Se ction 564(b)(1) of the Act, 21 U.S.C. section 360bbb-3(b)(1), unless the authorization is terminated or revoked sooner.  Performed at [facility], [address] [place], [place] [address]   Surgical pcr screen     Status: Abnormal   Collection Time: 11/02/21  1:07 PM   Specimen: Nasal Mucosa; Nasal Swab  Result Value Ref Range Status   MRSA, PCR NEGATIVE NEGATIVE Final   Staphylococcus aureus POSITIVE (A) NEGATIVE Final    Comment: (NOTE) The Xpert SA Assay (FDA approved for NASAL specimens in patients 31 years of age and older), is one component of a comprehensive surveillance program. It is not intended to diagnose infection nor to guide or monitor treatment. Performed at [facility], [address], [place], [place] [address]     Labs: CBC: Recent Labs  Lab 09/25/23 [phone] 09/25/23 0855 09/25/23 1343 09/25/23 2249 09/26/23 0436 09/26/23 0928 09/27/23 0505 09/27/23 2200 09/28/23 0445 09/28/23 1150 09/28/23 2040 09/29/23 0415 09/29/23 0950  WBC 16.7*  --  15.2* 18.0* 14.4*  --  15.8*  --  17.8*  --   --   --   --   NEUTROABS 13.5*  --   --   --   --   --   --   --   --   --   --   --   --   HGB 5.7*   < > 5.6* 7.9* 6.6*   < > 7.5*   < > 8.6* 9.1* 9.9* 8.6* 9.3*  HCT 18.4*  --  17.3* 23.6* 19.4*  --  22.4*   < > 25.5* 26.6* 28.7* 25.3* 27.9*  MCV 90.2  --  89.2 87.1 87.0  --  86.8  --  85.9  --   --   --   --   PLT 304  --  276 275 223  --  210  --  182  --   --   --   --    < > = values in this interval not displayed.   Basic Metabolic Panel: Recent Labs  Lab 09/25/23 0855 09/26/23 0436 09/27/23 0505 09/28/23 0445 09/29/23 0415  NA 135 136 134* 133* 133*  K 3.8 3.5 3.4* 3.4* 4.1  CL 104 105 105 104 102  CO2 21* 22  23 23 25   GLUCOSE 126* 95 92 96 102*  BUN 24* 18 14 13 12   CREATININE 1.22 1.01 1.08 0.89 0.97  CALCIUM  8.4* 7.6* 7.8* 7.5* 7.6*  MG 1.9  --   --  1.5* 2.1  PHOS  --   --   --  2.9 2.6   Liver Function Tests: Recent Labs  Lab 09/25/23 0855 09/26/23 0436 09/29/23 0415  AST 39 30  --   ALT 20 16  --   ALKPHOS 185* 125  --   BILITOT 0.8 0.7  --   PROT 5.7* 4.4*  --   ALBUMIN 2.2* 1.7* 1.6*   CBG: No results for input(s): GLUCAP in the last 168 hours.  Discharge time spent: greater than 30 minutes.  Signed: Cresencio Fairly, MD [facility] 09/29/2023

## 2023-09-29 NOTE — Plan of Care (Signed)

## 2023-09-29 NOTE — Progress Notes (Signed)
 Nutrition Follow-up  DOCUMENTATION CODES:   Not applicable  INTERVENTION:   -MVI with minerals daily -Ensure Plus High Protein po TID, each supplement provides 350 kcal and 20 grams of protein  -Magic cup TID with meals, each supplement provides 290 kcal and 9 grams of protein  -RD will follow for diet advancement and adjust supplement regimen as appropriate  NUTRITION DIAGNOSIS:   Increased nutrient needs related to cancer and cancer related treatments as evidenced by estimated needs.  Ongoing  GOAL:   Patient will meet greater than or equal to 90% of their needs  Progressing   MONITOR:   PO intake, Supplement acceptance, Diet advancement  REASON FOR ASSESSMENT:   Malnutrition Screening Tool    ASSESSMENT:   Pt with medical history significant for cholangiocarcinoma on ongoing chemo started in July 2025 completed 1 complete cycle, second cycle was interrupted due to GI bleeding, CAD, CVA, A-fib on Eliquis , aortic valve replacement with bioprosthetic valve, TAVR, GERD, HTN, HLD who was at cancer center for a laboratory work further evaluation for continuing chemotherapy cycle 2-day 8 of cisplatin , gemcitabine  and durvalumab .  Patient was found to be severely anemic at 5.7 hemoglobin, complaining of black tarry stool with history of recent multiple blood transfusion for the same, referred directly by oncologist for transfusion and GI workup.  8/29- s/p EGD- revealed benign-appearing esophageal stenosis (transversed); s/p colonoscopy- revealed Decreased sphincter tone and internal hemorrhoids that prolapse with straining, but spontaneously regress to the resting position (Grade II) found on digital rectal exam; s/p capsule endoscopy- capsule did not reach cecum- total transit time 8 hours 8/30- advanced to clear liquid diet 8/31- s/p small bowel enteroscopy- normal, advanced to full liquid diet  Reviewed I/O's: +1 L x 24 hours and +4.6 L since admission  Pt unavailable at  time of visit. Attempted to speak with pt via call to hospital room phone, however, unable to reach. RD unable to obtain further nutrition-related history or complete nutrition-focused physical exam at this time.    Pt currently on a full liquid diet. Per discussion with RN, pt is tolerating well and also drinking Ensure well. Noted documented meal completions 0-100%.  Medications reviewed and include protonix .  Labs reviewed: Na: 133.    Diet Order:   Diet Order             Diet full liquid Fluid consistency: Thin  Diet effective now                   EDUCATION NEEDS:   No education needs have been identified at this time  Skin:  Skin Assessment: Reviewed RN Assessment  Last BM:  09/26/23 (type 7)  Height:   Ht Readings from Last 1 Encounters:  09/25/23 5' 5 (1.651 m)    Weight:   Wt Readings from Last 1 Encounters:  09/25/23 68.9 kg    Ideal Body Weight:  61.8 kg  BMI:  Body mass index is 25.29 kg/m.  Estimated Nutritional Needs:   Kcal:  1700-1900  Protein:  90-105 grams  Fluid:  1.7-1.9 L    Margery ORN, RD, LDN, CDCES Registered Dietitian III Certified Diabetes Care and Education Specialist If unable to reach this RD, please use RD Inpatient group chat on secure chat between hours of 8am-4 pm daily

## 2023-09-29 NOTE — Consult Note (Signed)
 PHARMACY CONSULT NOTE - ELECTROLYTES  Pharmacy Consult for Electrolyte Monitoring and Replacement   Recent Labs: Potassium (mmol/L)  Date Value  09/29/2023 4.1   Magnesium  (mg/dL)  Date Value  90/98/7974 2.1   Calcium  (mg/dL)  Date Value  90/98/7974 7.6 (L)   Albumin (g/dL)  Date Value  90/98/7974 1.6 (L)  07/07/2023 4.0   Phosphorus (mg/dL)  Date Value  90/98/7974 2.6   Sodium (mmol/L)  Date Value  09/29/2023 133 (L)  07/07/2023 136   Height: 5' 5 (165.1 cm) Weight: 68.9 kg (152 lb) IBW/kg (Calculated) : 61.5 Estimated Creatinine Clearance: 52 mL/min (by C-G formula based on SCr of 0.97 mg/dL).  Assessment  Jon Mejia is a 83 y.o. male presenting with GIB. 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 Pharmacy has been consulted to monitor and replace electrolytes.  Diet: full liquid MIVF: N/A Pertinent medications: N/A  Goal of Therapy: Electrolytes within normal limits  Plan:  No electrolyte replacement at this time F/u with AM labs.   Thank you for allowing pharmacy to be a part of this patient's care.  Denitra Donaghey A, PharmD 09/29/2023 6:56 AM

## 2023-09-30 ENCOUNTER — Inpatient Hospital Stay

## 2023-09-30 ENCOUNTER — Inpatient Hospital Stay: Attending: Oncology

## 2023-09-30 ENCOUNTER — Inpatient Hospital Stay (HOSPITAL_BASED_OUTPATIENT_CLINIC_OR_DEPARTMENT_OTHER): Admitting: Oncology

## 2023-09-30 ENCOUNTER — Telehealth: Payer: Self-pay

## 2023-09-30 ENCOUNTER — Ambulatory Visit

## 2023-09-30 ENCOUNTER — Encounter: Payer: Self-pay | Admitting: Oncology

## 2023-09-30 VITALS — BP 139/87 | HR 99 | Temp 97.8°F | Resp 18 | Ht 65.0 in | Wt 157.0 lb

## 2023-09-30 VITALS — BP 115/66

## 2023-09-30 DIAGNOSIS — D72829 Elevated white blood cell count, unspecified: Secondary | ICD-10-CM | POA: Diagnosis not present

## 2023-09-30 DIAGNOSIS — D696 Thrombocytopenia, unspecified: Secondary | ICD-10-CM | POA: Insufficient documentation

## 2023-09-30 DIAGNOSIS — C787 Secondary malignant neoplasm of liver and intrahepatic bile duct: Secondary | ICD-10-CM | POA: Insufficient documentation

## 2023-09-30 DIAGNOSIS — C786 Secondary malignant neoplasm of retroperitoneum and peritoneum: Secondary | ICD-10-CM | POA: Insufficient documentation

## 2023-09-30 DIAGNOSIS — Z87891 Personal history of nicotine dependence: Secondary | ICD-10-CM | POA: Insufficient documentation

## 2023-09-30 DIAGNOSIS — E785 Hyperlipidemia, unspecified: Secondary | ICD-10-CM | POA: Diagnosis not present

## 2023-09-30 DIAGNOSIS — Z801 Family history of malignant neoplasm of trachea, bronchus and lung: Secondary | ICD-10-CM | POA: Insufficient documentation

## 2023-09-30 DIAGNOSIS — E876 Hypokalemia: Secondary | ICD-10-CM | POA: Insufficient documentation

## 2023-09-30 DIAGNOSIS — I480 Paroxysmal atrial fibrillation: Secondary | ICD-10-CM | POA: Insufficient documentation

## 2023-09-30 DIAGNOSIS — C801 Malignant (primary) neoplasm, unspecified: Secondary | ICD-10-CM

## 2023-09-30 DIAGNOSIS — C221 Intrahepatic bile duct carcinoma: Secondary | ICD-10-CM

## 2023-09-30 DIAGNOSIS — I10 Essential (primary) hypertension: Secondary | ICD-10-CM | POA: Insufficient documentation

## 2023-09-30 DIAGNOSIS — Z5111 Encounter for antineoplastic chemotherapy: Secondary | ICD-10-CM | POA: Diagnosis not present

## 2023-09-30 DIAGNOSIS — Z79899 Other long term (current) drug therapy: Secondary | ICD-10-CM | POA: Diagnosis not present

## 2023-09-30 DIAGNOSIS — E871 Hypo-osmolality and hyponatremia: Secondary | ICD-10-CM | POA: Diagnosis not present

## 2023-09-30 DIAGNOSIS — Z7901 Long term (current) use of anticoagulants: Secondary | ICD-10-CM | POA: Insufficient documentation

## 2023-09-30 DIAGNOSIS — I2510 Atherosclerotic heart disease of native coronary artery without angina pectoris: Secondary | ICD-10-CM | POA: Diagnosis not present

## 2023-09-30 DIAGNOSIS — Z5112 Encounter for antineoplastic immunotherapy: Secondary | ICD-10-CM | POA: Insufficient documentation

## 2023-09-30 DIAGNOSIS — D649 Anemia, unspecified: Secondary | ICD-10-CM | POA: Diagnosis not present

## 2023-09-30 LAB — CBC WITH DIFFERENTIAL/PLATELET
Abs Immature Granulocytes: 0.26 K/uL — ABNORMAL HIGH (ref 0.00–0.07)
Basophils Absolute: 0.1 K/uL (ref 0.0–0.1)
Basophils Relative: 0 %
Eosinophils Absolute: 0.1 K/uL (ref 0.0–0.5)
Eosinophils Relative: 1 %
HCT: 28.4 % — ABNORMAL LOW (ref 39.0–52.0)
Hemoglobin: 9.4 g/dL — ABNORMAL LOW (ref 13.0–17.0)
Immature Granulocytes: 1 %
Lymphocytes Relative: 7 %
Lymphs Abs: 1.5 K/uL (ref 0.7–4.0)
MCH: 28.7 pg (ref 26.0–34.0)
MCHC: 33.1 g/dL (ref 30.0–36.0)
MCV: 86.9 fL (ref 80.0–100.0)
Monocytes Absolute: 2 K/uL — ABNORMAL HIGH (ref 0.1–1.0)
Monocytes Relative: 10 %
Neutro Abs: 17.3 K/uL — ABNORMAL HIGH (ref 1.7–7.7)
Neutrophils Relative %: 81 %
Platelets: 196 K/uL (ref 150–400)
RBC: 3.27 MIL/uL — ABNORMAL LOW (ref 4.22–5.81)
RDW: 15.3 % (ref 11.5–15.5)
WBC: 21.2 K/uL — ABNORMAL HIGH (ref 4.0–10.5)
nRBC: 0 % (ref 0.0–0.2)

## 2023-09-30 LAB — CMP (CANCER CENTER ONLY)
ALT: 23 U/L (ref 0–44)
AST: 42 U/L — ABNORMAL HIGH (ref 15–41)
Albumin: 2 g/dL — ABNORMAL LOW (ref 3.5–5.0)
Alkaline Phosphatase: 184 U/L — ABNORMAL HIGH (ref 38–126)
Anion gap: 5 (ref 5–15)
BUN: 14 mg/dL (ref 8–23)
CO2: 24 mmol/L (ref 22–32)
Calcium: 7.8 mg/dL — ABNORMAL LOW (ref 8.9–10.3)
Chloride: 101 mmol/L (ref 98–111)
Creatinine: 1.1 mg/dL (ref 0.61–1.24)
GFR, Estimated: >60 mL/min (ref >60)
Glucose, Bld: 110 mg/dL — ABNORMAL HIGH (ref 70–99)
Potassium: 3.5 mmol/L (ref 3.5–5.1)
Sodium: 130 mmol/L — ABNORMAL LOW (ref 135–145)
Total Bilirubin: 1 mg/dL (ref 0.0–1.2)
Total Protein: 5.3 g/dL — ABNORMAL LOW (ref 6.5–8.1)

## 2023-09-30 LAB — SAMPLE TO BLOOD BANK

## 2023-09-30 LAB — MAGNESIUM: Magnesium: 1.9 mg/dL (ref 1.7–2.4)

## 2023-09-30 MED ORDER — PALONOSETRON HCL INJECTION 0.25 MG/5ML
0.2500 mg | Freq: Once | INTRAVENOUS | Status: AC
  Administered 2023-09-30: 0.25 mg via INTRAVENOUS
  Filled 2023-09-30: qty 5

## 2023-09-30 MED ORDER — MAGNESIUM SULFATE 2 GM/50ML IV SOLN
2.0000 g | Freq: Once | INTRAVENOUS | Status: AC
  Administered 2023-09-30: 2 g via INTRAVENOUS
  Filled 2023-09-30: qty 50

## 2023-09-30 MED ORDER — SODIUM CHLORIDE 0.9 % IV SOLN
INTRAVENOUS | Status: DC
  Filled 2023-09-30: qty 250

## 2023-09-30 MED ORDER — SODIUM CHLORIDE 0.9 % IV SOLN
INTRAVENOUS | Status: DC
  Filled 2023-09-30 (×2): qty 250

## 2023-09-30 MED ORDER — POTASSIUM CHLORIDE IN NACL 20-0.9 MEQ/L-% IV SOLN
Freq: Once | INTRAVENOUS | Status: AC
  Filled 2023-09-30: qty 1000

## 2023-09-30 MED ORDER — SODIUM CHLORIDE 0.9 % IV SOLN
800.0000 mg/m2 | Freq: Once | INTRAVENOUS | Status: AC
  Administered 2023-09-30: 1482 mg via INTRAVENOUS
  Filled 2023-09-30: qty 38.98

## 2023-09-30 MED ORDER — APREPITANT 130 MG/18ML IV EMUL
130.0000 mg | Freq: Once | INTRAVENOUS | Status: AC
  Administered 2023-09-30: 130 mg via INTRAVENOUS
  Filled 2023-09-30: qty 18

## 2023-09-30 MED ORDER — DEXAMETHASONE SODIUM PHOSPHATE 10 MG/ML IJ SOLN
10.0000 mg | Freq: Once | INTRAMUSCULAR | Status: AC
  Administered 2023-09-30: 10 mg via INTRAVENOUS
  Filled 2023-09-30: qty 1

## 2023-09-30 MED ORDER — SODIUM CHLORIDE 0.9 % IV SOLN
12.5000 mg/m2 | Freq: Once | INTRAVENOUS | Status: AC
  Administered 2023-09-30: 23 mg via INTRAVENOUS
  Filled 2023-09-30: qty 23

## 2023-09-30 NOTE — Progress Notes (Signed)
 Urine output prior to cisplatin  was . Per Dr. Jacobo, OK to proceed. Also OK per MD to resume post-hydration with cisplatin .

## 2023-09-30 NOTE — Progress Notes (Signed)
 Patient and family have questions about the CT scan that was done 09/29/2023.

## 2023-09-30 NOTE — Progress Notes (Signed)
 Nutrition Follow-up:  Patient with stage IV possible biliary trach primary (upper GI or pancreatobiliary).  Patient receiving cisplatin , gemcitabine  and durvalumab .   Met with patient during infusion.  Noted hospital admission recently with GI bleed.  Patient reports appetite is better.  Ate hamburger last night and few fries.  Ate some chips this am in infusion.  Drinks ensure shakes.      Medications: reviewed  Labs: reviewed  Anthropometrics:   Weight 157 lb today  158 lb on 8/5 154 lb on 7/21 167 lb on 6/26 183 lb on 01/09/23   NUTRITION DIAGNOSIS: Inadequate oral intake improving   INTERVENTION:  Continue high calorie, high protein foods Continue oral nutrition supplement as often as able for additional nutrition    MONITORING, EVALUATION, GOAL: weight trends, intake   NEXT VISIT: Thursday, Oct 2nd during infusion  Anjolie Majer B. Dasie SOLON, CSO, LDN Registered Dietitian [phone]

## 2023-09-30 NOTE — Progress Notes (Signed)
 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    INTERVAL HISTORY: Patient returns to clinic today for hospital follow-up, and reconsideration of cycle 2, day 8 of cisplatin , gemcitabine , and durvalumab .  He feels significantly improved with multiple units of packed red blood cells.  Luminal evaluation did not reveal bleeding source, but CT scan on September 29, 2023 revealed significant progression of disease as well as a small bowel mass that is likely the source of his bleeding.  He has no neurologic complaints.  He denies any recent fevers or illnesses.  He has no chest pain, shortness of breath, cough, or hemoptysis.  He does not complain of abdominal pain today.  He denies any nausea, vomiting, constipation, or diarrhea.  He has no melena or hematochezia today.  He has no urinary complaints.  Patient offers no further specific complaints today.  REVIEW OF SYSTEMS:   Review of Systems  Constitutional:  Positive for malaise/fatigue. Negative for fever and weight loss.  Respiratory: Negative.  Negative for cough, hemoptysis and shortness of breath.   Cardiovascular: Negative.  Negative for chest pain and leg swelling.  Gastrointestinal: Negative.  Negative for abdominal pain, blood in stool, constipation, diarrhea, melena, nausea and vomiting.  Genitourinary: Negative.  Negative for dysuria.  Musculoskeletal: Negative.  Negative for back pain.  Skin:  Negative.  Negative for rash.  Neurological: Negative.  Negative for dizziness, focal weakness, weakness and headaches.  Psychiatric/Behavioral: Negative.  The patient is not nervous/anxious.     As per HPI. Otherwise, a complete review of systems is negative.  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   CAD (coronary artery disease) 09/17/2021   a.) R/LHC 09/17/2021: 30% pRCA, 40% dRCA, 50% pLCx, 30% dLCx, 50% pLAD, 50% mLAD, 50% dLAD --> med mgmt   Cardiac murmur    Cervical spondylosis    Cholelithiasis    CRAO (central retinal artery occlusion), 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   CVA (cerebral vascular accident) (HCC) 03/13/2018   a.) MRI brain 03/14/2018 --> acute infarct in the posterior RIGHT frontal lobe; small chronic cerebellar infarct --> Tx'd with TPA with (+) improvement/resolution of symptoms   Diastolic dysfunction    a.) TTE 01/31/2015: EF 55-60%, MAC, G1DD; b.) TTE 03/14/2018: EF 60-65%, mild LAE, sev MAC, sev AoV cal with mod AS (MPG 14) G2DD; c.) TTE 09/13/2019: EF 55-60%, mild LAE, mild-mod MR, mild AS (MPG 12.5), G2DD; d.) TTE 08/16/2021: EF 60-65%, mild LVH, mod MAC, mild MR, mod AS (MPG 18.5), G1DD; e.) TTE 12/05/2021: EF 60-65%, mod MAC, s/p TAVR (well seated/functioning valve), mild MR, G1DD   GERD (gastroesophageal reflux disease)    History of chicken pox    History of measles  Hyperlipidemia    Hypertension    LBBB (left bundle branch block)    Long term current use of anticoagulant    a.) apixaban    Low back pain radiating to left lower extremity    PAF (paroxysmal atrial fibrillation) (HCC)    a.) CHA2DS2VASc = 6 (age x2, HTN, CVA x2, vascular disease history);   b.) rate/rhythm maintained on oral metoprolol  succinate; chronically anticoagulated with apixaban    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   a.) 26mm S3UR via RIGHT TF approach   Skin cancer     PAST SURGICAL HISTORY: Past Surgical History:  Procedure Laterality Date   COLONOSCOPY  09/26/2023   Procedure: COLONOSCOPY;  Surgeon: Onita Elspeth Sharper, DO;  Location: [facility] ENDOSCOPY;  Service: Gastroenterology;;   ENTEROSCOPY N/A 09/28/2023   Procedure: ENTEROSCOPY;  Surgeon: Unk Corinn Skiff, MD;  Location: [facility] ENDOSCOPY;  Service: Gastroenterology;  Laterality: N/A;   ESOPHAGOGASTRODUODENOSCOPY N/A 09/26/2023   Procedure: EGD (ESOPHAGOGASTRODUODENOSCOPY);  Surgeon: Onita Elspeth Sharper, DO;  Location: [facility] ENDOSCOPY;  Service: Gastroenterology;  Laterality: N/A;   GIVENS CAPSULE STUDY  09/26/2023   Procedure: IMAGING PROCEDURE, GI TRACT, INTRALUMINAL, VIA CAPSULE;  Surgeon: Onita Elspeth Sharper, DO;  Location: [facility] ENDOSCOPY;  Service: Gastroenterology;;   INTRAOPERATIVE TRANSTHORACIC ECHOCARDIOGRAM N/A 11/06/2021   Procedure: INTRAOPERATIVE TRANSTHORACIC ECHOCARDIOGRAM;  Surgeon: Verlin Lonni BIRCH, MD;  Location: [facility] OR;  Service: Open Heart Surgery;  Laterality: N/A;   IR IMAGING GUIDED PORT INSERTION  08/07/2023   RIGHT/LEFT HEART CATH AND CORONARY ANGIOGRAPHY N/A 09/17/2021   Procedure: RIGHT/LEFT HEART CATH AND CORONARY ANGIOGRAPHY;  Surgeon: Verlin Lonni BIRCH, MD;  Location: [facility] INVASIVE CV LAB;  Service: Cardiovascular;  Laterality: N/A;   ROBOTIC ASSISTED LAPAROSCOPIC CHOLECYSTECTOMY  07/24/2022   SKIN CANCER EXCISION  07/2020   TRANSCATHETER AORTIC VALVE REPLACEMENT, TRANSFEMORAL N/A 11/06/2021   Procedure: Transcatheter Aortic Valve Replacement, Transfemoral;  Surgeon: Verlin Lonni BIRCH, MD;  Location: [facility] OR;  Service: Open Heart Surgery;  Laterality: N/A;    FAMILY HISTORY: Family History  Problem Relation Age of Onset   Arrhythmia  Mother        A-Fib   Lung cancer Father    Diabetes Son    Colon cancer Neg Hx    Prostate cancer Neg Hx     ADVANCED DIRECTIVES (Y/N):  N  HEALTH MAINTENANCE: Social History   Tobacco Use   Smoking status: Former    Current packs/day: 0.00    Average packs/day: 1 pack/day for 20.0 years (20.0 ttl pk-yrs)    Types: Cigarettes    Start date: 03/12/1963    Quit date: 03/12/1983    Years since quitting: 40.5    Passive exposure: Past   Smokeless tobacco: Never  Vaping Use   Vaping status: Never Used  Substance Use Topics   Alcohol use: No   Drug use: No     Colonoscopy:  PAP:  Bone density:  Lipid panel:  No Known Allergies  Current Outpatient Medications  Medication Sig Dispense Refill   amLODipine  (NORVASC ) 10 MG tablet Take 1 tablet (10 mg total) by mouth daily. 90 tablet 3   atorvastatin  (LIPITOR) 80 MG tablet TAKE ONE TABLET BY MOUTH ONCE DAILY 30 tablet 3   Cyanocobalamin (B-12) 3000 MCG CAPS Take 3,000 mcg by mouth daily as needed (energy).     lidocaine -prilocaine  (EMLA ) cream Apply to affected area once 30 g 3   metoprolol  succinate (TOPROL -XL) 25 MG 24 hr tablet TAKE ONE AND ONE-HALF TABLETS DAILY 45 tablet  3   ondansetron  (ZOFRAN ) 8 MG tablet Take 1 tablet (8 mg total) by mouth every 8 (eight) hours as needed for nausea or vomiting. Start on the third day after cisplatin . 60 tablet 2   oxyCODONE  (ROXICODONE ) 5 MG immediate release tablet Take 1 tablet (5 mg total) by mouth every 6 (six) hours as needed. 90 tablet 0   polyethylene glycol powder (GLYCOLAX /MIRALAX ) 17 GM/SCOOP powder Take 17 g by mouth as directed. 510 g 3   prochlorperazine  (COMPAZINE ) 10 MG tablet Take 1 tablet (10 mg total) by mouth every 6 (six) hours as needed (Nausea or vomiting). 60 tablet 2   senna (SENOKOT) 8.6 MG TABS tablet Take 2 tablets (17.2 mg total) by mouth 2 (two) times daily as needed for mild constipation or moderate constipation. 120 tablet 3   traZODone  (DESYREL ) 50 MG tablet  TAKE 1 TABLET BY MOUTH AT BEDTIME AS NEEDED FOR SLEEP 90 tablet 0   No current facility-administered medications for this visit.   Facility-Administered Medications Ordered in Other Visits  Medication Dose Route Frequency Provider Last Rate Last Admin   0.9 %  sodium chloride  infusion   Intravenous Continuous Jacobo, Tysheena Ginzburg J, MD       0.9 %  sodium chloride  infusion   Intravenous Continuous Zedric Deroy J, MD 10 mL/hr at 09/30/23 0939 New Bag at 09/30/23 0939   aprepitant  (CINVANTI ) injection 130 mg  130 mg Intravenous Once Aron Inge J, MD       CISplatin  (PLATINOL ) 23 mg in sodium chloride  0.9 % 250 mL chemo infusion  12.5 mg/m2 (Treatment Plan Recorded) Intravenous Once Seraphim Affinito J, MD       dexamethasone  (DECADRON ) injection 10 mg  10 mg Intravenous Once Makell Cyr J, MD       gemcitabine  (GEMZAR ) 1,482 mg in sodium chloride  0.9 % 250 mL chemo infusion  800 mg/m2 (Treatment Plan Recorded) Intravenous Once Val Schiavo J, MD       magnesium  sulfate IVPB 2 g 50 mL  2 g Intravenous Once Lakeithia Rasor J, MD 50 mL/hr at 09/30/23 0942 2 g at 09/30/23 0942   palonosetron  (ALOXI ) injection 0.25 mg  0.25 mg Intravenous Once Jacobo Evalene PARAS, MD        OBJECTIVE: Vitals:   09/30/23 0841  BP: 139/87  Pulse: 99  Resp: 18  Temp: 97.8 F (36.6 C)  SpO2: 100%     Body mass index is 26.13 kg/m.    ECOG FS:1 - Symptomatic but completely ambulatory  General: Well-developed, well-nourished, no acute distress. Eyes: Pink conjunctiva, anicteric sclera. HEENT: Normocephalic, moist mucous membranes. Lungs: No audible wheezing or coughing. Heart: Regular rate and rhythm. Abdomen: Soft, nontender, no obvious distention. Musculoskeletal: No edema, cyanosis, or clubbing. Neuro: Alert, answering all questions appropriately. Cranial nerves grossly intact. Skin: No rashes or petechiae noted. Psych: Normal affect.  LAB RESULTS:  Lab Results  Component Value  Date   NA 130 (L) 09/30/2023   K 3.5 09/30/2023   CL 101 09/30/2023   CO2 24 09/30/2023   GLUCOSE 110 (H) 09/30/2023   BUN 14 09/30/2023   CREATININE 1.10 09/30/2023   CALCIUM  7.8 (L) 09/30/2023   PROT 5.3 (L) 09/30/2023   ALBUMIN 2.0 (L) 09/30/2023   AST 42 (H) 09/30/2023   ALT 23 09/30/2023   ALKPHOS 184 (H) 09/30/2023   BILITOT 1.0 09/30/2023   GFRNONAA >60 09/30/2023   GFRAA 81 08/25/2019    Lab Results  Component Value Date   WBC  21.2 (H) 09/30/2023   NEUTROABS 17.3 (H) 09/30/2023   HGB 9.4 (L) 09/30/2023   HCT 28.4 (L) 09/30/2023   MCV 86.9 09/30/2023   PLT 196 09/30/2023     STUDIES: CT ENTERO ABD/PELVIS W CONTAST Result Date: 09/29/2023 EXAM: CT ABDOMEN AND PELVIS WITH CONTRAST (ENTEROGRAPHY) 09/29/2023 11:04:29 AM TECHNIQUE: CT of the abdomen and pelvis was performed after the administration of intravenous contrast and negative oral contrast. Automated exposure control, iterative reconstruction, and/or weight based adjustment of the mA/kV was utilized to reduce the radiation dose to as low as reasonably achievable. COMPARISON: 07/10/2023 CT abdomen/pelvis, 09/28/2023 abdominal radiograph, and 07/15/2023 PET/CT. CLINICAL HISTORY: GI bleed. 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 FINDINGS: LOWER CHEST: Numerous (greater than 10) solid pulmonary nodules scattered throughout both lung bases, new since 07/10/2023 CT. Largest 0.9 cm in the right lower lobe on series 4 image 9 and 1.0 cm in the medial left upper lobe on image 2. LIVER: Innumerable (greater than 20) hypodense liver masses scattered throughout the liver, increased in size and number. Representative 3.4 x 3.3 cm posterior right liver mass on series 2 image 24, increased from 1.6 x 1.5 cm. Representative 2.3 x 1.9 cm anterior left liver mass on image 22, increased from 1.0 x 0.9 cm. GALLBLADDER AND BILE DUCTS: Cholecystectomy. No biliary ductal dilatation. SPLEEN: No acute abnormality. PANCREAS: No  acute abnormality. ADRENAL GLANDS: No acute abnormality. KIDNEYS, URETERS AND BLADDER: No stones in the kidneys or ureters. No hydronephrosis. No perinephric or periureteral stranding. Urinary bladder is unremarkable. STOMACH AND BOWEL: New irregular 3.9 x 2.6 cm small bowel mass within a right abdominal small bowel loop on series 2 image 69. No dilated small bowel loops. Diminutive appendix. No large bowel wall thickening or diverticulosis. Metallic density 2.0 x 1.4 cm structure within the mid rectal lumen on series 5 image 84. No active intraluminal contrast extravasation. PERITONEUM AND RETROPERITONEUM: Omental soft tissue caking is substantially decreased with dominant residual 1.7 x 1.3 cm right omental nodule on series 2 image 54. New mesenteric soft tissue implants, largest 1.8 x 1.6 cm on image 67. Upper right pelvic sidewall 2.5 x 2.3 cm implant on image 74, increased from 1.1 x 0.9 cm. VASCULATURE: Atherosclerotic nonaneurysmal abdominal aorta. Superior approach central venous catheter tip seen at the cavoatrial junction. LYMPH NODES: Newly mildly enlarged 1.1 cm porta hepatis lymph node noted on series 2 image 33. REPRODUCTIVE ORGANS: Stable mild prostatomegaly. BONES AND SOFT TISSUES: Moderate thoracolumbar spondylosis. IMPRESSION: 1. New irregular 3.9 x 2.6 cm small bowel mass within a right abdominal small bowel loop compatible with small bowel metastasis, likely the source of GI bleeding. No active GI bleed on CT. 2. Interval progression of advanced liver metastatic disease. 3. Innumerable new lung metastases at the lung bases. 4. Mixed changes in the peritoneal metastatic disease, with decreased omental caking, but new/increased metastatic implants in the mesentery and right pelvic sidewall. 5. New metastatic adenopathy to the porta hepatis. Electronically signed by: Selinda Blue MD 09/29/2023 11:37 AM EDT RP Workstation: [id number]   DG Abd 1 View Result Date: 09/28/2023 CLINICAL DATA:  Nausea.  EXAM: ABDOMEN - 1 VIEW COMPARISON:  Radiograph yesterday FINDINGS: Retained endoscopy capsule projects over the right pelvis, unchanged. No small bowel distension or evidence of obstruction. Mild gaseous distension of transverse colon. No significant formed colonic stool. TAVR partially included. Vascular calcifications are seen. IMPRESSION: 1. Retained endoscopy capsule projects over the right pelvis, unchanged. 2. Mild gaseous distension of transverse colon. No  evidence of bowel obstruction. Electronically Signed   By: Andrea Gasman M.D.   On: 09/28/2023 23:45   DG Abd 1 View Result Date: 09/27/2023 EXAM: 1 VIEW XRAY OF THE ABDOMEN 09/27/2023 02:01:36 PM COMPARISON: None available. CLINICAL HISTORY: Pt had video capsule study yesterday, capsule did not reach cecum on images, rule out any retained capsule. FINDINGS: BOWEL: Nonobstructive bowel gas pattern. SOFT TISSUES: No opaque urinary calculi. Endoscopy capsule in right lower quadrant. TAVR and right chest power port noted. BONES: No acute osseous abnormality. Probable phleboliths in the pelvis. IMPRESSION: 1. Endoscopy capsule in the right lower quadrant, consistent with retained capsule. 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  Luminal evaluation during his admission did not reveal a source of bleeding, but CT scan on September 29, 2023 revealed significant progression of disease as well as a small bowel mass that is likely the source.  Previous biopsy revealed a poorly differentiated carcinoma consistent with either upper GI or pancreatobiliary origin.  Tumor markers are negative.  NGS testing suggested tissue of origin is possibly cholangiocarcinoma (82%) as well as a  high tumor mutational burden indicating that immunotherapy would be of benefit.  Patient is receiving cisplatin , gemcitabine , and durvalumab  on day 1 with cisplatin  and gemcitabine  only on day 8 followed by G-CSF support.  This is a 21-day cycle.  Patient has had port placed.  Both gemcitabine  and cisplatin  have now been dose reduced secondary to persistent anemia.  We once again talked at length about hospice and end-of-life, but patient wishes to pursue treatment today and continue to have discussions with his family.  Return to clinic in 1 week for further evaluation.  Cycle 3, day 1 is currently scheduled for 2 weeks from today.   Abdominal pain: Patient does not complain of this today.  Continue oxycodone  as prescribed. Anemia: Hemoglobin improved to 9.4.  Luminal evaluation and imaging results as above.  Eliquis  has been discontinued.  Proceed with treatment cautiously as above. Leukocytosis: Chronic and unchanged.  Likely reactive. Hyponatremia: Patient's sodium level has trended down to 130, monitor.    Patient expressed understanding and was in agreement with this plan. He also understands that He can call clinic at any time with any questions, concerns, or complaints.    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

## 2023-09-30 NOTE — Patient Instructions (Signed)
 [facility] - A DEPT OF [facility]  Discharge Instructions: Thank you for choosing [facility] to provide your oncology and hematology care.  If you have a lab appointment with the Cancer Center, please go directly to the Cancer Center and check in at the registration area.  Wear comfortable clothing and clothing appropriate for easy access to any Portacath or PICC line.   We strive to give you quality time with your provider. You may need to reschedule your appointment if you arrive late (15 or more minutes).  Arriving late affects you and other patients whose appointments are after yours.  Also, if you miss three or more appointments without notifying the office, you may be dismissed from the clinic at the provider's discretion.      For prescription refill requests, have your pharmacy contact our office and allow 72 hours for refills to be completed.    Today you received the following chemotherapy and/or immunotherapy agents- gemzar , cisplatin       To help prevent nausea and vomiting after your treatment, we encourage you to take your nausea medication as directed.  BELOW ARE SYMPTOMS THAT SHOULD BE REPORTED IMMEDIATELY: *FEVER GREATER THAN 100.4 F (38 C) OR HIGHER *CHILLS OR SWEATING *NAUSEA AND VOMITING THAT IS NOT CONTROLLED WITH YOUR NAUSEA MEDICATION *UNUSUAL SHORTNESS OF BREATH *UNUSUAL BRUISING OR BLEEDING *URINARY PROBLEMS (pain or burning when urinating, or frequent urination) *BOWEL PROBLEMS (unusual diarrhea, constipation, pain near the anus) TENDERNESS IN MOUTH AND THROAT WITH OR WITHOUT PRESENCE OF ULCERS (sore throat, sores in mouth, or a toothache) UNUSUAL RASH, SWELLING OR PAIN  UNUSUAL VAGINAL DISCHARGE OR ITCHING   Items with * indicate a potential emergency and should be followed up as soon as possible or go to the Emergency Department if any problems should occur.  Please show the CHEMOTHERAPY ALERT CARD or  IMMUNOTHERAPY ALERT CARD at check-in to the Emergency Department and triage nurse.  Should you have questions after your visit or need to cancel or reschedule your appointment, please contact [facility] - A DEPT OF [facility]  [phone] and follow the prompts.  Office hours are 8:00 a.m. to 4:30 p.m. Monday - Friday. Please note that voicemails left after 4:00 p.m. may not be returned until the following business day.  We are closed weekends and major holidays. You have access to a nurse at all times for urgent questions. Please call the main number to the clinic [phone] and follow the prompts.  For any non-urgent questions, you may also contact your provider using MyChart. We now offer e-Visits for anyone 35 and older to request care online for non-urgent symptoms. For details visit [email or web address].   Also download the MyChart app! Go to the app store, search MyChart, open the app, select [facility], and log in with your MyChart username and password.

## 2023-09-30 NOTE — Transitions of Care (Post Inpatient/ED Visit) (Signed)
 09/30/2023  Name: Jon Mejia MRN: [id number] DOB: [birth year]  Today's TOC FU Call Status: Today's TOC FU Call Status:: Successful TOC FU Call Completed Unsuccessful Call (1st Attempt) Date: 09/30/23 [facility] FU Call Complete Date: 09/30/23 Patient's Name and Date of Birth confirmed.  Transition Care Management Follow-up Telephone Call Date of Discharge: 09/29/23 [facility]) Type of Discharge: Inpatient Admission Primary Inpatient Discharge Diagnosis:: GI bleed How have you been since you were released from the hospital?: Better Any questions or concerns?: No  Items Reviewed: Did you receive and understand the discharge instructions provided?: Yes Medications obtained,verified, and reconciled?: Yes (Medications Reviewed) Any new allergies since your discharge?: No Dietary orders reviewed?: Yes Do you have support at home?: Yes People in Home [RPT]: spouse  Medications Reviewed Today: Medications Reviewed Today     Reviewed by Emmitt Pan, LPN (Licensed Practical Nurse) on 09/30/23 at [phone]  Med List Status: <None>   Medication Order Taking? Sig Documenting Provider Last Dose Status Informant  0.9 %  sodium chloride  infusion [id number]   Finnegan, Timothy J, MD  Active   0.9 %  sodium chloride  infusion [id number]   Jacobo Evalene PARAS, MD  Active   0.9 % NaCl with KCl 20 mEq/ L  infusion [id number]   Jacobo Evalene PARAS, MD  Active   amLODipine  (NORVASC ) 10 MG tablet [id number]  Take 1 tablet (10 mg total) by mouth daily. Furth, Cadence H, PA-C  Active Self, Spouse/Significant Other  aprepitant  (CINVANTI ) injection 130 mg [id number]   Jacobo Evalene PARAS, MD  Active   atorvastatin  (LIPITOR) 80 MG tablet [id number]  TAKE ONE TABLET BY MOUTH ONCE DAILY Sethi, Pramod S, MD  Active Self, Spouse/Significant Other  CISplatin  (PLATINOL ) 23 mg in sodium chloride  0.9 % 250 mL chemo infusion [id number]   Jacobo Evalene PARAS, MD  Active    Cyanocobalamin (B-12) 3000 MCG CAPS [id number]  Take 3,000 mcg by mouth daily as needed (energy). [provider]  Active Self, Spouse/Significant Other  dexamethasone  (DECADRON ) injection 10 mg [id number]   Jacobo Evalene PARAS, MD  Active   gemcitabine  (GEMZAR ) 1,482 mg in sodium chloride  0.9 % 250 mL chemo infusion [id number]   Jacobo Evalene PARAS, MD  Active   lidocaine -prilocaine  (EMLA ) cream [id number]  Apply to affected area once Dasie Tinnie MATSU, NP  Active Self, Spouse/Significant Other  magnesium  sulfate IVPB 2 g 50 mL [id number]   Jacobo Evalene PARAS, MD  Active   metoprolol  succinate (TOPROL -XL) 25 MG 24 hr tablet [id number]  TAKE ONE AND ONE-HALF TABLETS DAILY Furth, Cadence H, PA-C  Active Self, Spouse/Significant Other  ondansetron  (ZOFRAN ) 8 MG tablet [id number]  Take 1 tablet (8 mg total) by mouth every 8 (eight) hours as needed for nausea or vomiting. Start on the third day after cisplatin . Jacobo Evalene PARAS, MD  Active Self, Spouse/Significant Other  oxyCODONE  (ROXICODONE ) 5 MG immediate release tablet [id number]  Take 1 tablet (5 mg total) by mouth every 6 (six) hours as needed. Dasie Tinnie MATSU, NP  Active Self, Spouse/Significant Other  palonosetron  (ALOXI ) injection 0.25 mg [id number]   Jacobo Evalene PARAS, MD  Active   polyethylene glycol powder (GLYCOLAX /MIRALAX ) 17 GM/SCOOP powder [id number]  Take 17 g by mouth as directed. Dasie Tinnie MATSU, NP  Active Self, Spouse/Significant Other  prochlorperazine  (COMPAZINE ) 10 MG tablet [id number]  Take 1 tablet (10 mg total) by mouth every 6 (six) hours as needed (Nausea or vomiting). Jacobo Evalene PARAS, MD  Active Self, Spouse/Significant  Other  senna (SENOKOT) 8.6 MG TABS tablet [id number]  Take 2 tablets (17.2 mg total) by mouth 2 (two) times daily as needed for mild constipation or moderate constipation. Dasie Tinnie MATSU, NP  Active Self, Spouse/Significant Other  traZODone  (DESYREL ) 50 MG tablet [id number]  TAKE 1 TABLET BY MOUTH AT  BEDTIME AS NEEDED FOR SLEEP Finnegan, Evalene PARAS, MD  Active Self, Spouse/Significant Other            Home Care and Equipment/Supplies: Were Home Health Services Ordered?: NA Any new equipment or medical supplies ordered?: NA  Functional Questionnaire: Do you need assistance with bathing/showering or dressing?: No Do you need assistance with meal preparation?: No Do you need assistance with eating?: No Do you have difficulty maintaining continence: No Do you need assistance with getting out of bed/getting out of a chair/moving?: No Do you have difficulty managing or taking your medications?: No  Follow up appointments reviewed: PCP Follow-up appointment confirmed?: Yes Date of PCP follow-up appointment?: 10/02/23 Follow-up Provider: [facility] Follow-up appointment confirmed?: Yes Date of Specialist follow-up appointment?: 09/30/23 Follow-Up Specialty Provider:: onco Do you need transportation to your follow-up appointment?: No Do you understand care options if your condition(s) worsen?: Yes-patient verbalized understanding    SIGNATURE Julian Lemmings, LPN [facility] Nurse Health Advisor Direct Dial [phone]

## 2023-09-30 NOTE — Transitions of Care (Post Inpatient/ED Visit) (Signed)
   09/30/2023  Name: Jon Mejia MRN: [id number] DOB: [birth year]  Today's TOC FU Call Status: Today's TOC FU Call Status:: Unsuccessful Call (1st Attempt) Unsuccessful Call (1st Attempt) Date: 09/30/23  Attempted to reach the patient regarding the most recent Inpatient/ED visit.  Follow Up Plan: Additional outreach attempts will be made to reach the patient to complete the Transitions of Care (Post Inpatient/ED visit) call.   Signature Julian Lemmings, LPN [facility] Nurse Health Advisor Direct Dial [phone]

## 2023-10-01 NOTE — Progress Notes (Signed)
 Pharmacy Quality Measure Review  This patient is appearing on a report for being at risk of failing the adherence measure for cholesterol (statin) medications this calendar year.   Medication: atorvastatin  80 mg Last fill date: 08/12/23 for 30 day supply  Contacted pharmacy to facilitate refills. Pharmacy has one refill left of this prescription.   Cheral Cappucci E. Marsh, PharmD Clinical Pharmacist [facility] [phone]

## 2023-10-02 ENCOUNTER — Ambulatory Visit: Admitting: Family

## 2023-10-02 ENCOUNTER — Ambulatory Visit

## 2023-10-02 ENCOUNTER — Other Ambulatory Visit: Payer: Self-pay | Admitting: Family Medicine

## 2023-10-02 DIAGNOSIS — E7800 Pure hypercholesterolemia, unspecified: Secondary | ICD-10-CM

## 2023-10-02 MED ORDER — ATORVASTATIN CALCIUM 80 MG PO TABS: 80.0000 mg | ORAL_TABLET | Freq: Every day | ORAL | 3 refills | Status: DC

## 2023-10-07 ENCOUNTER — Inpatient Hospital Stay (HOSPITAL_BASED_OUTPATIENT_CLINIC_OR_DEPARTMENT_OTHER): Admitting: Hospice and Palliative Medicine

## 2023-10-07 ENCOUNTER — Inpatient Hospital Stay

## 2023-10-07 ENCOUNTER — Encounter: Payer: Self-pay | Admitting: Oncology

## 2023-10-07 ENCOUNTER — Other Ambulatory Visit

## 2023-10-07 ENCOUNTER — Inpatient Hospital Stay: Admitting: Oncology

## 2023-10-07 ENCOUNTER — Ambulatory Visit: Admitting: Oncology

## 2023-10-07 VITALS — BP 132/59 | HR 92 | Temp 96.8°F | Resp 16 | Wt 155.8 lb

## 2023-10-07 DIAGNOSIS — Z5112 Encounter for antineoplastic immunotherapy: Secondary | ICD-10-CM | POA: Diagnosis not present

## 2023-10-07 DIAGNOSIS — C221 Intrahepatic bile duct carcinoma: Secondary | ICD-10-CM

## 2023-10-07 DIAGNOSIS — Z515 Encounter for palliative care: Secondary | ICD-10-CM

## 2023-10-07 LAB — CBC WITH DIFFERENTIAL (CANCER CENTER ONLY)
Abs Immature Granulocytes: 0.1 K/uL — ABNORMAL HIGH (ref 0.00–0.07)
Basophils Absolute: 0 K/uL (ref 0.0–0.1)
Basophils Relative: 0 %
Eosinophils Absolute: 0 K/uL (ref 0.0–0.5)
Eosinophils Relative: 0 %
HCT: 25.4 % — ABNORMAL LOW (ref 39.0–52.0)
Hemoglobin: 8.3 g/dL — ABNORMAL LOW (ref 13.0–17.0)
Immature Granulocytes: 1 %
Lymphocytes Relative: 7 %
Lymphs Abs: 0.8 K/uL (ref 0.7–4.0)
MCH: 28.7 pg (ref 26.0–34.0)
MCHC: 32.7 g/dL (ref 30.0–36.0)
MCV: 87.9 fL (ref 80.0–100.0)
Monocytes Absolute: 0.9 K/uL (ref 0.1–1.0)
Monocytes Relative: 7 %
Neutro Abs: 10.8 K/uL — ABNORMAL HIGH (ref 1.7–7.7)
Neutrophils Relative %: 85 %
Platelet Count: 50 K/uL — ABNORMAL LOW (ref 150–400)
RBC: 2.89 MIL/uL — ABNORMAL LOW (ref 4.22–5.81)
RDW: 14.7 % (ref 11.5–15.5)
WBC Count: 12.7 K/uL — ABNORMAL HIGH (ref 4.0–10.5)
nRBC: 0 % (ref 0.0–0.2)

## 2023-10-07 LAB — CMP (CANCER CENTER ONLY)
ALT: 48 U/L — ABNORMAL HIGH (ref 0–44)
AST: 42 U/L — ABNORMAL HIGH (ref 15–41)
Albumin: 2 g/dL — ABNORMAL LOW (ref 3.5–5.0)
Alkaline Phosphatase: 226 U/L — ABNORMAL HIGH (ref 38–126)
Anion gap: 7 (ref 5–15)
BUN: 19 mg/dL (ref 8–23)
CO2: 24 mmol/L (ref 22–32)
Calcium: 7.9 mg/dL — ABNORMAL LOW (ref 8.9–10.3)
Chloride: 103 mmol/L (ref 98–111)
Creatinine: 0.87 mg/dL (ref 0.61–1.24)
GFR, Estimated: >60 mL/min (ref >60)
Glucose, Bld: 113 mg/dL — ABNORMAL HIGH (ref 70–99)
Potassium: 3.7 mmol/L (ref 3.5–5.1)
Sodium: 134 mmol/L — ABNORMAL LOW (ref 135–145)
Total Bilirubin: 1 mg/dL (ref 0.0–1.2)
Total Protein: 5.3 g/dL — ABNORMAL LOW (ref 6.5–8.1)

## 2023-10-07 LAB — PREPARE RBC (CROSSMATCH)

## 2023-10-07 MED ORDER — SODIUM CHLORIDE 0.9% FLUSH
10.0000 mL | Freq: Once | INTRAVENOUS | Status: AC
  Administered 2023-10-07: 10 mL via INTRAVENOUS
  Filled 2023-10-07: qty 10

## 2023-10-07 NOTE — Progress Notes (Signed)
 [facility] CSW Progress Note  Visual merchandiser met with patient and his wife, [name], per her request to follow-up on documentation for insurance.    Interventions: [name] requested a letter from Dr. Jacobo indicating patient's liver cancer was identified on July 10, 2023.  Consulted Dr. Jacobo and securely emailed the letter to [facility] indicating date of diagnosis.      Follow Up Plan:  CSW will follow-up with patient by phone     Macario CHRISTELLA Au, LCSW Clinical Social Worker [facility]

## 2023-10-07 NOTE — Progress Notes (Signed)
 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    INTERVAL HISTORY: Patient returns to clinic today for repeat laboratory work, further evaluation, and discussion on whether or not to continue chemotherapy using cisplatin , gemcitabine , and durvalumab .  He continues to have chronic weakness and fatigue, but otherwise feels well.  He has no neurologic complaints.  He denies any recent fevers or illnesses.  He has no chest pain, shortness of breath, cough, or hemoptysis.  He does not complain of abdominal pain today.  He denies any nausea, vomiting, constipation, or diarrhea.  He has no melena or hematochezia today.  He has no urinary complaints.  Patient offers no further specific complaints today.  REVIEW OF SYSTEMS:   Review of Systems  Constitutional:  Positive for malaise/fatigue. Negative for fever and weight loss.  Respiratory: Negative.  Negative for cough, hemoptysis and shortness of breath.   Cardiovascular: Negative.  Negative for chest pain and leg swelling.  Gastrointestinal: Negative.  Negative for abdominal pain, blood in stool, constipation, diarrhea, melena, nausea and vomiting.  Genitourinary: Negative.  Negative for dysuria.  Musculoskeletal: Negative.  Negative for back pain.  Skin: Negative.  Negative for rash.  Neurological:  Positive for weakness. Negative for dizziness, focal weakness and headaches.  Psychiatric/Behavioral:  Negative.  The patient is not nervous/anxious.     As per HPI. Otherwise, a complete review of systems is negative.  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   CAD (coronary artery disease) 09/17/2021   a.) R/LHC 09/17/2021: 30% pRCA, 40% dRCA, 50% pLCx, 30% dLCx, 50% pLAD, 50% mLAD, 50% dLAD --> med mgmt   Cardiac murmur    Cervical spondylosis    Cholelithiasis    CRAO (central retinal artery occlusion), 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   CVA (cerebral vascular accident) (HCC) 03/13/2018   a.) MRI brain 03/14/2018 --> acute infarct in the posterior RIGHT frontal lobe; small chronic cerebellar infarct --> Tx'd with TPA with (+) improvement/resolution of symptoms   Diastolic dysfunction    a.) TTE 01/31/2015: EF 55-60%, MAC, G1DD; b.) TTE 03/14/2018: EF 60-65%, mild LAE, sev MAC, sev AoV cal with mod AS (MPG 14) G2DD; c.) TTE 09/13/2019: EF 55-60%, mild LAE, mild-mod MR, mild AS (MPG 12.5), G2DD; d.) TTE 08/16/2021: EF 60-65%, mild LVH, mod MAC, mild MR, mod AS (MPG 18.5), G1DD; e.) TTE 12/05/2021: EF 60-65%, mod MAC, s/p TAVR (well seated/functioning valve), mild MR, G1DD   GERD (gastroesophageal reflux disease)    History of chicken pox    History of measles    Hyperlipidemia    Hypertension    LBBB (left bundle branch block)    Long term current use of anticoagulant    a.)  apixaban    Low back pain radiating to left lower extremity    PAF (paroxysmal atrial fibrillation) (HCC)    a.) CHA2DS2VASc = 6 (age x2, HTN, CVA x2, vascular disease history);  b.) rate/rhythm maintained on oral metoprolol  succinate; chronically anticoagulated with apixaban    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   a.) 26mm S3UR via RIGHT TF approach   Skin cancer     PAST SURGICAL HISTORY: Past Surgical History:  Procedure Laterality Date   COLONOSCOPY  09/26/2023   Procedure: COLONOSCOPY;  Surgeon: Onita Elspeth Sharper, DO;  Location: [facility] ENDOSCOPY;  Service: Gastroenterology;;   ENTEROSCOPY N/A 09/28/2023   Procedure: ENTEROSCOPY;  Surgeon: Unk Corinn Skiff, MD;  Location: [facility] ENDOSCOPY;  Service: Gastroenterology;  Laterality: N/A;   ESOPHAGOGASTRODUODENOSCOPY N/A 09/26/2023   Procedure: EGD (ESOPHAGOGASTRODUODENOSCOPY);  Surgeon: Onita Elspeth Sharper, DO;  Location: [facility] ENDOSCOPY;  Service: Gastroenterology;  Laterality: N/A;   GIVENS CAPSULE STUDY  09/26/2023   Procedure: IMAGING PROCEDURE, GI TRACT, INTRALUMINAL, VIA CAPSULE;  Surgeon: Onita Elspeth Sharper, DO;  Location: [facility] ENDOSCOPY;  Service: Gastroenterology;;   INTRAOPERATIVE TRANSTHORACIC ECHOCARDIOGRAM N/A 11/06/2021   Procedure: INTRAOPERATIVE TRANSTHORACIC ECHOCARDIOGRAM;  Surgeon: Verlin Lonni BIRCH, MD;  Location: [facility] OR;  Service: Open Heart Surgery;  Laterality: N/A;   IR IMAGING GUIDED PORT INSERTION  08/07/2023   RIGHT/LEFT HEART CATH AND CORONARY ANGIOGRAPHY N/A 09/17/2021   Procedure: RIGHT/LEFT HEART CATH AND CORONARY ANGIOGRAPHY;  Surgeon: Verlin Lonni BIRCH, MD;  Location: [facility] INVASIVE CV LAB;  Service: Cardiovascular;  Laterality: N/A;   ROBOTIC ASSISTED LAPAROSCOPIC CHOLECYSTECTOMY  07/24/2022   SKIN CANCER EXCISION  07/2020   TRANSCATHETER AORTIC VALVE REPLACEMENT, TRANSFEMORAL N/A 11/06/2021   Procedure: Transcatheter Aortic Valve Replacement, Transfemoral;  Surgeon: Verlin Lonni BIRCH, MD;  Location: [facility] OR;  Service: Open Heart Surgery;  Laterality: N/A;    FAMILY HISTORY: Family History  Problem Relation Age of Onset   Arrhythmia Mother        A-Fib   Lung cancer Father    Diabetes Son    Colon cancer Neg Hx    Prostate cancer Neg Hx     ADVANCED DIRECTIVES  (Y/N):  N  HEALTH MAINTENANCE: Social History   Tobacco Use   Smoking status: Former    Current packs/day: 0.00    Average packs/day: 1 pack/day for 20.0 years (20.0 ttl pk-yrs)    Types: Cigarettes    Start date: 03/12/1963    Quit date: 03/12/1983    Years since quitting: 40.6    Passive exposure: Past   Smokeless tobacco: Never  Vaping Use   Vaping status: Never Used  Substance Use Topics   Alcohol use: No   Drug use: No     Colonoscopy:  PAP:  Bone density:  Lipid panel:  No Known Allergies  Current Outpatient Medications  Medication Sig Dispense Refill   amLODipine  (NORVASC ) 10 MG tablet Take 1 tablet (10 mg total) by mouth daily. 90 tablet 3   atorvastatin  (LIPITOR) 80 MG tablet Take 1 tablet (80 mg total) by mouth daily. 90 tablet 3   Cyanocobalamin (B-12) 3000 MCG CAPS Take 3,000 mcg by mouth daily as needed (energy).     lidocaine -prilocaine  (EMLA ) cream Apply to affected area once 30 g 3   metoprolol  succinate (TOPROL -XL) 25 MG 24 hr tablet TAKE ONE AND ONE-HALF TABLETS DAILY 45 tablet 3   ondansetron  (ZOFRAN ) 8 MG tablet Take 1 tablet (8 mg total) by mouth every 8 (eight) hours as needed for nausea  or vomiting. Start on the third day after cisplatin . 60 tablet 2   oxyCODONE  (ROXICODONE ) 5 MG immediate release tablet Take 1 tablet (5 mg total) by mouth every 6 (six) hours as needed. 90 tablet 0   polyethylene glycol powder (GLYCOLAX /MIRALAX ) 17 GM/SCOOP powder Take 17 g by mouth as directed. 510 g 3   prochlorperazine  (COMPAZINE ) 10 MG tablet Take 1 tablet (10 mg total) by mouth every 6 (six) hours as needed (Nausea or vomiting). 60 tablet 2   senna (SENOKOT) 8.6 MG TABS tablet Take 2 tablets (17.2 mg total) by mouth 2 (two) times daily as needed for mild constipation or moderate constipation. 120 tablet 3   traZODone  (DESYREL ) 50 MG tablet TAKE 1 TABLET BY MOUTH AT BEDTIME AS NEEDED FOR SLEEP 90 tablet 0   No current facility-administered medications for this  visit.    OBJECTIVE: Vitals:   10/07/23 0912  BP: (!) 132/59  Pulse: 92  Resp: 16  Temp: (!) 96.8 F (36 C)  SpO2: 99%     Body mass index is 25.93 kg/m.    ECOG FS:1 - Symptomatic but completely ambulatory  General: Well-developed, well-nourished, no acute distress. Eyes: Pink conjunctiva, anicteric sclera. HEENT: Normocephalic, moist mucous membranes. Lungs: No audible wheezing or coughing. Heart: Regular rate and rhythm. Abdomen: Soft, nontender, no obvious distention. Musculoskeletal: No edema, cyanosis, or clubbing. Neuro: Alert, answering all questions appropriately. Cranial nerves grossly intact. Skin: No rashes or petechiae noted. Psych: Normal affect.  LAB RESULTS:  Lab Results  Component Value Date   NA 134 (L) 10/07/2023   K 3.7 10/07/2023   CL 103 10/07/2023   CO2 24 10/07/2023   GLUCOSE 113 (H) 10/07/2023   BUN 19 10/07/2023   CREATININE 0.87 10/07/2023   CALCIUM  7.9 (L) 10/07/2023   PROT 5.3 (L) 10/07/2023   ALBUMIN 2.0 (L) 10/07/2023   AST 42 (H) 10/07/2023   ALT 48 (H) 10/07/2023   ALKPHOS 226 (H) 10/07/2023   BILITOT 1.0 10/07/2023   GFRNONAA >60 10/07/2023   GFRAA 81 08/25/2019    Lab Results  Component Value Date   WBC 12.7 (H) 10/07/2023   NEUTROABS 10.8 (H) 10/07/2023   HGB 8.3 (L) 10/07/2023   HCT 25.4 (L) 10/07/2023   MCV 87.9 10/07/2023   PLT 50 (L) 10/07/2023     STUDIES: CT ENTERO ABD/PELVIS W CONTAST Result Date: 09/29/2023 EXAM: CT ABDOMEN AND PELVIS WITH CONTRAST (ENTEROGRAPHY) 09/29/2023 11:04:29 AM TECHNIQUE: CT of the abdomen and pelvis was performed after the administration of intravenous contrast and negative oral contrast. Automated exposure control, iterative reconstruction, and/or weight based adjustment of the mA/kV was utilized to reduce the radiation dose to as low as reasonably achievable. COMPARISON: 07/10/2023 CT abdomen/pelvis, 09/28/2023 abdominal radiograph, and 07/15/2023 PET/CT. CLINICAL HISTORY: GI bleed.  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 FINDINGS: LOWER CHEST: Numerous (greater than 10) solid pulmonary nodules scattered throughout both lung bases, new since 07/10/2023 CT. Largest 0.9 cm in the right lower lobe on series 4 image 9 and 1.0 cm in the medial left upper lobe on image 2. LIVER: Innumerable (greater than 20) hypodense liver masses scattered throughout the liver, increased in size and number. Representative 3.4 x 3.3 cm posterior right liver mass on series 2 image 24, increased from 1.6 x 1.5 cm. Representative 2.3 x 1.9 cm anterior left liver mass on image 22, increased from 1.0 x 0.9 cm. GALLBLADDER AND BILE DUCTS: Cholecystectomy. No biliary ductal dilatation. SPLEEN: No acute abnormality. PANCREAS:  No acute abnormality. ADRENAL GLANDS: No acute abnormality. KIDNEYS, URETERS AND BLADDER: No stones in the kidneys or ureters. No hydronephrosis. No perinephric or periureteral stranding. Urinary bladder is unremarkable. STOMACH AND BOWEL: New irregular 3.9 x 2.6 cm small bowel mass within a right abdominal small bowel loop on series 2 image 69. No dilated small bowel loops. Diminutive appendix. No large bowel wall thickening or diverticulosis. Metallic density 2.0 x 1.4 cm structure within the mid rectal lumen on series 5 image 84. No active intraluminal contrast extravasation. PERITONEUM AND RETROPERITONEUM: Omental soft tissue caking is substantially decreased with dominant residual 1.7 x 1.3 cm right omental nodule on series 2 image 54. New mesenteric soft tissue implants, largest 1.8 x 1.6 cm on image 67. Upper right pelvic sidewall 2.5 x 2.3 cm implant on image 74, increased from 1.1 x 0.9 cm. VASCULATURE: Atherosclerotic nonaneurysmal abdominal aorta. Superior approach central venous catheter tip seen at the cavoatrial junction. LYMPH NODES: Newly mildly enlarged 1.1 cm porta hepatis lymph node noted on series 2 image 33. REPRODUCTIVE ORGANS: Stable mild prostatomegaly.  BONES AND SOFT TISSUES: Moderate thoracolumbar spondylosis. IMPRESSION: 1. New irregular 3.9 x 2.6 cm small bowel mass within a right abdominal small bowel loop compatible with small bowel metastasis, likely the source of GI bleeding. No active GI bleed on CT. 2. Interval progression of advanced liver metastatic disease. 3. Innumerable new lung metastases at the lung bases. 4. Mixed changes in the peritoneal metastatic disease, with decreased omental caking, but new/increased metastatic implants in the mesentery and right pelvic sidewall. 5. New metastatic adenopathy to the porta hepatis. Electronically signed by: Selinda Blue MD 09/29/2023 11:37 AM EDT RP Workstation: [id number]   DG Abd 1 View Result Date: 09/28/2023 CLINICAL DATA:  Nausea. EXAM: ABDOMEN - 1 VIEW COMPARISON:  Radiograph yesterday FINDINGS: Retained endoscopy capsule projects over the right pelvis, unchanged. No small bowel distension or evidence of obstruction. Mild gaseous distension of transverse colon. No significant formed colonic stool. TAVR partially included. Vascular calcifications are seen. IMPRESSION: 1. Retained endoscopy capsule projects over the right pelvis, unchanged. 2. Mild gaseous distension of transverse colon. No evidence of bowel obstruction. Electronically Signed   By: Andrea Gasman M.D.   On: 09/28/2023 23:45   DG Abd 1 View Result Date: 09/27/2023 EXAM: 1 VIEW XRAY OF THE ABDOMEN 09/27/2023 02:01:36 PM COMPARISON: None available. CLINICAL HISTORY: Pt had video capsule study yesterday, capsule did not reach cecum on images, rule out any retained capsule. FINDINGS: BOWEL: Nonobstructive bowel gas pattern. SOFT TISSUES: No opaque urinary calculi. Endoscopy capsule in right lower quadrant. TAVR and right chest power port noted. BONES: No acute osseous abnormality. Probable phleboliths in the pelvis. IMPRESSION: 1. Endoscopy capsule in the right lower quadrant, consistent with retained capsule. 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  Luminal evaluation during his admission did not reveal a source of bleeding, but CT scan on September 29, 2023 revealed significant progression of disease as well as a small bowel mass that is likely the source.  Previous biopsy revealed a poorly differentiated carcinoma consistent with either upper GI or pancreatobiliary origin.  Tumor markers are negative.  NGS testing suggested tissue of origin is possibly cholangiocarcinoma (82%) as well as a high tumor mutational  burden indicating that immunotherapy would be of benefit.  Patient is receiving cisplatin , gemcitabine , and durvalumab  on day 1 with cisplatin  and gemcitabine  only on day 8 followed by G-CSF support.  This is a 21-day cycle.  Patient has had port placed.  Both gemcitabine  and cisplatin  have now been dose reduced secondary to persistent anemia.  After lengthy discussion, patient does not wish to pursue hospice at this time and would like to continue treatment if possible.  We have discussed the possibility of using single agent durvalumab  if his cytopenias continue to be an issue.  Return to clinic in 1 week for further evaluation and reconsideration of cycle 3, day 1.   Abdominal pain: Patient does not complain of this today.  Continue oxycodone  as prescribed. Anemia: Hemoglobin dropped to 8.3.  Given his likely active GI bleed, he will return to clinic tomorrow for 1 unit packed red blood cells.  Luminal evaluation and imaging results as above.  Eliquis  has been discontinued.   Leukocytosis: Total white count remains elevated but improved.  Monitor.  Likely reactive.   Thrombocytopenia: Likely secondary to chemotherapy.  Monitor. Hyponatremia:  Patient's sodium level has trended down to 130, monitor.    Patient expressed understanding and was in agreement with this plan. He also understands that He can call clinic at any time with any questions, concerns, or complaints.    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

## 2023-10-07 NOTE — Progress Notes (Signed)
 Palliative Medicine [facility] at [facility] [phone] [phone]   Name: Jon Mejia Date: 10/07/2023 MRN: [id number]  DOB: November 22, 1940  Patient Care Team: Gasper Nancyann BRAVO, MD as PCP - General (Family Medicine) Perla Evalene PARAS, MD as PCP - Cardiology (Cardiology) Portia Fireman, OD as Consulting Physician (Optometry) Perla, Evalene PARAS, MD as Consulting Physician (Cardiology) Maurie Rayfield BIRCH, RN as Oncology Nurse Navigator Jacobo, Evalene PARAS, MD as Consulting Physician (Oncology)    REASON FOR CONSULTATION: Jon Mejia is a 83 y.o. 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  Patient is being treated as a metastatic cholangiocarcinoma.  Palliative care was consulted to address goals and manage ongoing symptoms.  SOCIAL HISTORY:     reports that he quit smoking about 40 years ago. His smoking use included cigarettes. He started smoking about 60 years ago. He has a 20 pack-year smoking history. He has been exposed to tobacco smoke. He has never used smokeless tobacco. He reports that he does not drink alcohol and does not use drugs.  Patient is married and lives at home with his wife.  He retired from [employer].  ADVANCE DIRECTIVES:    CODE STATUS: DNR/DNI  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   CAD (coronary artery disease) 09/17/2021   a.) R/LHC 09/17/2021: 30% pRCA, 40% dRCA, 50% pLCx, 30% dLCx, 50% pLAD, 50% mLAD, 50% dLAD --> med mgmt   Cardiac murmur    Cervical spondylosis    Cholelithiasis    CRAO (central  retinal artery occlusion), 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   CVA (cerebral vascular accident) (HCC) 03/13/2018   a.) MRI brain 03/14/2018 --> acute infarct in the posterior RIGHT frontal lobe; small chronic cerebellar infarct --> Tx'd with TPA with (+) improvement/resolution of symptoms   Diastolic dysfunction    a.) TTE 01/31/2015: EF 55-60%, MAC, G1DD; b.) TTE 03/14/2018: EF 60-65%, mild LAE, sev MAC, sev AoV cal with mod AS (MPG 14) G2DD; c.) TTE 09/13/2019: EF 55-60%, mild LAE, mild-mod MR, mild AS (MPG 12.5), G2DD; d.) TTE 08/16/2021: EF 60-65%, mild LVH, mod MAC, mild MR, mod AS (MPG 18.5), G1DD; e.) TTE 12/05/2021: EF 60-65%, mod MAC, s/p TAVR (well seated/functioning valve), mild MR, G1DD   GERD (gastroesophageal reflux disease)    History of chicken pox    History of measles    Hyperlipidemia    Hypertension    LBBB (left bundle branch block)    Long term current use of anticoagulant    a.) apixaban    Low back pain radiating to left lower extremity    PAF (paroxysmal atrial fibrillation) (HCC)    a.) CHA2DS2VASc = 6 (age x2, HTN, CVA x2, vascular disease history);  b.) rate/rhythm maintained on oral metoprolol  succinate; chronically anticoagulated with apixaban    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   a.) 26mm S3UR via RIGHT TF approach   Skin cancer     PAST SURGICAL HISTORY:  Past Surgical History:  Procedure Laterality Date   COLONOSCOPY  09/26/2023   Procedure: COLONOSCOPY;  Surgeon: Onita Elspeth Sharper, DO;  Location: [facility] ENDOSCOPY;  Service: Gastroenterology;;   ENTEROSCOPY N/A 09/28/2023   Procedure: ENTEROSCOPY;  Surgeon: Unk Corinn Skiff, MD;  Location: [facility] ENDOSCOPY;  Service: Gastroenterology;  Laterality: N/A;   ESOPHAGOGASTRODUODENOSCOPY N/A 09/26/2023   Procedure: EGD (ESOPHAGOGASTRODUODENOSCOPY);  Surgeon: Onita Elspeth Sharper, DO;  Location: [facility] ENDOSCOPY;  Service: Gastroenterology;  Laterality: N/A;   GIVENS CAPSULE STUDY  09/26/2023    Procedure: IMAGING PROCEDURE, GI TRACT, INTRALUMINAL, VIA CAPSULE;  Surgeon: Onita Elspeth Sharper, DO;  Location: [facility] ENDOSCOPY;  Service: Gastroenterology;;   INTRAOPERATIVE TRANSTHORACIC ECHOCARDIOGRAM N/A 11/06/2021   Procedure: INTRAOPERATIVE TRANSTHORACIC ECHOCARDIOGRAM;  Surgeon: Verlin Lonni BIRCH, MD;  Location: [facility] OR;  Service: Open Heart Surgery;  Laterality: N/A;   IR IMAGING GUIDED PORT INSERTION  08/07/2023   RIGHT/LEFT HEART CATH AND CORONARY ANGIOGRAPHY N/A 09/17/2021   Procedure: RIGHT/LEFT HEART CATH AND CORONARY ANGIOGRAPHY;  Surgeon: Verlin Lonni BIRCH, MD;  Location: [facility] INVASIVE CV LAB;  Service: Cardiovascular;  Laterality: N/A;   ROBOTIC ASSISTED LAPAROSCOPIC CHOLECYSTECTOMY  07/24/2022   SKIN CANCER EXCISION  07/2020   TRANSCATHETER AORTIC VALVE REPLACEMENT, TRANSFEMORAL N/A 11/06/2021   Procedure: Transcatheter Aortic Valve Replacement, Transfemoral;  Surgeon: Verlin Lonni BIRCH, MD;  Location: [facility] OR;  Service: Open Heart Surgery;  Laterality: N/A;    HEMATOLOGY/ONCOLOGY HISTORY:  Oncology History  Peritoneal carcinomatosis (HCC)  07/30/2023 Initial Diagnosis   Peritoneal carcinomatosis (HCC)   08/12/2023 -  Chemotherapy   Patient is on Treatment Plan : BILIARY TRACT Cisplatin  + Gemcitabine  D1,8 + Durvalumab  (1500) D1 q21d / Durvalumab  (1500) q28d     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  MEDICATIONS:  Current Outpatient Medications  Medication Sig Dispense Refill   amLODipine  (NORVASC ) 10 MG tablet Take 1 tablet (10 mg total) by mouth daily. 90 tablet 3   atorvastatin  (LIPITOR) 80 MG tablet Take 1 tablet (80 mg total) by  mouth daily. 90 tablet 3   Cyanocobalamin (B-12) 3000 MCG CAPS Take 3,000 mcg by mouth daily as needed (energy).     lidocaine -prilocaine  (EMLA ) cream Apply to affected area once 30 g 3   metoprolol  succinate (TOPROL -XL) 25 MG 24 hr tablet TAKE ONE AND ONE-HALF TABLETS DAILY 45 tablet 3   ondansetron  (ZOFRAN ) 8 MG tablet Take 1 tablet (8 mg total) by mouth every 8 (eight) hours as needed for nausea or vomiting. Start on the third day after cisplatin . 60 tablet 2   oxyCODONE  (ROXICODONE ) 5 MG immediate release tablet Take 1 tablet (5 mg total) by mouth every 6 (six) hours as needed. 90 tablet 0   polyethylene glycol powder (GLYCOLAX /MIRALAX ) 17 GM/SCOOP powder Take 17 g by mouth as directed. 510 g 3   prochlorperazine  (COMPAZINE ) 10 MG tablet Take 1 tablet (10 mg total) by mouth every 6 (six) hours as needed (Nausea or vomiting). 60 tablet 2   senna (SENOKOT) 8.6 MG TABS tablet Take 2 tablets (17.2 mg total) by mouth 2 (two) times daily as needed for mild constipation or moderate constipation. 120 tablet 3   traZODone  (DESYREL ) 50 MG tablet TAKE 1 TABLET BY MOUTH AT BEDTIME AS NEEDED FOR SLEEP 90 tablet 0   No current facility-administered medications for this visit.  VITAL SIGNS: There were no vitals taken for this visit. There were no vitals filed for this visit.  Estimated body mass index is 25.93 kg/m as calculated from the following:   Height as of 09/30/23: 5' 5 (1.651 m).   Weight as of an earlier encounter on 10/07/23: 155 lb 12.8 oz (70.7 kg).  LABS: CBC:    Component Value Date/Time   WBC 12.7 (H) 10/07/2023 0854   WBC 21.2 (H) 09/30/2023 0808   HGB 8.3 (L) 10/07/2023 0854   HGB 11.5 (L) 07/07/2023 0858   HCT 25.4 (L) 10/07/2023 0854   HCT 34.6 (L) 07/07/2023 0858   PLT 50 (L) 10/07/2023 0854   PLT 332 07/07/2023 0858   MCV 87.9 10/07/2023 0854   MCV 87 07/07/2023 0858   NEUTROABS 10.8 (H) 10/07/2023 0854   LYMPHSABS 0.8 10/07/2023 0854   MONOABS 0.9 10/07/2023 0854    EOSABS 0.0 10/07/2023 0854   BASOSABS 0.0 10/07/2023 0854   Comprehensive Metabolic Panel:    Component Value Date/Time   NA 134 (L) 10/07/2023 0855   NA 136 07/07/2023 0858   K 3.7 10/07/2023 0855   CL 103 10/07/2023 0855   CO2 24 10/07/2023 0855   BUN 19 10/07/2023 0855   BUN 15 07/07/2023 0858   CREATININE 0.87 10/07/2023 0855   CREATININE 1.10 01/09/2017 0906   GLUCOSE 113 (H) 10/07/2023 0855   CALCIUM  7.9 (L) 10/07/2023 0855   AST 42 (H) 10/07/2023 0855   ALT 48 (H) 10/07/2023 0855   ALKPHOS 226 (H) 10/07/2023 0855   BILITOT 1.0 10/07/2023 0855   PROT 5.3 (L) 10/07/2023 0855   PROT 6.8 07/07/2023 0858   ALBUMIN 2.0 (L) 10/07/2023 0855   ALBUMIN 4.0 07/07/2023 0858    RADIOGRAPHIC STUDIES: CT ENTERO ABD/PELVIS W CONTAST Result Date: 09/29/2023 EXAM: CT ABDOMEN AND PELVIS WITH CONTRAST (ENTEROGRAPHY) 09/29/2023 11:04:29 AM TECHNIQUE: CT of the abdomen and pelvis was performed after the administration of intravenous contrast and negative oral contrast. Automated exposure control, iterative reconstruction, and/or weight based adjustment of the mA/kV was utilized to reduce the radiation dose to as low as reasonably achievable. COMPARISON: 07/10/2023 CT abdomen/pelvis, 09/28/2023 abdominal radiograph, and 07/15/2023 PET/CT. CLINICAL HISTORY: GI bleed. 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 FINDINGS: LOWER CHEST: Numerous (greater than 10) solid pulmonary nodules scattered throughout both lung bases, new since 07/10/2023 CT. Largest 0.9 cm in the right lower lobe on series 4 image 9 and 1.0 cm in the medial left upper lobe on image 2. LIVER: Innumerable (greater than 20) hypodense liver masses scattered throughout the liver, increased in size and number. Representative 3.4 x 3.3 cm posterior right liver mass on series 2 image 24, increased from 1.6 x 1.5 cm. Representative 2.3 x 1.9 cm anterior left liver mass on image 22, increased from 1.0 x 0.9 cm. GALLBLADDER AND  BILE DUCTS: Cholecystectomy. No biliary ductal dilatation. SPLEEN: No acute abnormality. PANCREAS: No acute abnormality. ADRENAL GLANDS: No acute abnormality. KIDNEYS, URETERS AND BLADDER: No stones in the kidneys or ureters. No hydronephrosis. No perinephric or periureteral stranding. Urinary bladder is unremarkable. STOMACH AND BOWEL: New irregular 3.9 x 2.6 cm small bowel mass within a right abdominal small bowel loop on series 2 image 69. No dilated small bowel loops. Diminutive appendix. No large bowel wall thickening or diverticulosis. Metallic density 2.0 x 1.4 cm structure within the mid rectal lumen on series 5 image 84. No active intraluminal contrast extravasation. PERITONEUM AND RETROPERITONEUM: Omental soft tissue caking is  substantially decreased with dominant residual 1.7 x 1.3 cm right omental nodule on series 2 image 54. New mesenteric soft tissue implants, largest 1.8 x 1.6 cm on image 67. Upper right pelvic sidewall 2.5 x 2.3 cm implant on image 74, increased from 1.1 x 0.9 cm. VASCULATURE: Atherosclerotic nonaneurysmal abdominal aorta. Superior approach central venous catheter tip seen at the cavoatrial junction. LYMPH NODES: Newly mildly enlarged 1.1 cm porta hepatis lymph node noted on series 2 image 33. REPRODUCTIVE ORGANS: Stable mild prostatomegaly. BONES AND SOFT TISSUES: Moderate thoracolumbar spondylosis. IMPRESSION: 1. New irregular 3.9 x 2.6 cm small bowel mass within a right abdominal small bowel loop compatible with small bowel metastasis, likely the source of GI bleeding. No active GI bleed on CT. 2. Interval progression of advanced liver metastatic disease. 3. Innumerable new lung metastases at the lung bases. 4. Mixed changes in the peritoneal metastatic disease, with decreased omental caking, but new/increased metastatic implants in the mesentery and right pelvic sidewall. 5. New metastatic adenopathy to the porta hepatis. Electronically signed by: Selinda Blue MD 09/29/2023 11:37  AM EDT RP Workstation: [id number]   DG Abd 1 View Result Date: 09/28/2023 CLINICAL DATA:  Nausea. EXAM: ABDOMEN - 1 VIEW COMPARISON:  Radiograph yesterday FINDINGS: Retained endoscopy capsule projects over the right pelvis, unchanged. No small bowel distension or evidence of obstruction. Mild gaseous distension of transverse colon. No significant formed colonic stool. TAVR partially included. Vascular calcifications are seen. IMPRESSION: 1. Retained endoscopy capsule projects over the right pelvis, unchanged. 2. Mild gaseous distension of transverse colon. No evidence of bowel obstruction. Electronically Signed   By: Andrea Gasman M.D.   On: 09/28/2023 23:45   DG Abd 1 View Result Date: 09/27/2023 EXAM: 1 VIEW XRAY OF THE ABDOMEN 09/27/2023 02:01:36 PM COMPARISON: None available. CLINICAL HISTORY: Pt had video capsule study yesterday, capsule did not reach cecum on images, rule out any retained capsule. FINDINGS: BOWEL: Nonobstructive bowel gas pattern. SOFT TISSUES: No opaque urinary calculi. Endoscopy capsule in right lower quadrant. TAVR and right chest power port noted. BONES: No acute osseous abnormality. Probable phleboliths in the pelvis. IMPRESSION: 1. Endoscopy capsule in the right lower quadrant, consistent with retained capsule. Electronically signed by: Rockey Kilts MD 09/27/2023 02:53 PM EDT RP Workstation: [id number]    PERFORMANCE STATUS (ECOG) : 2 - Symptomatic, <50% confined to bed  Review of Systems Unless otherwise noted, a complete review of systems is negative.  Physical Exam General: NAD Cardiovascular: regular rate and rhythm Pulmonary: clear ant fields Abdomen: soft, nontender, + bowel sounds GU: no suprapubic tenderness Extremities: no edema, no joint deformities Skin: no rashes Neurological: Weakness but otherwise nonfocal  IMPRESSION: Follow-up visit.  Patient accompanied by wife.  His daughter participated in the visit via phone.  Patient states that he is  doing okay.  Denies any significant changes or concerns.  Reports stable appetite and performance status.  Denies pain or other distressing symptoms at present.  Patient has been on dose reduced chemotherapy.  Unclear how effective treatments are at this point.  Goals were discussed and patient and wife would like to continue chemotherapy for now.  Hospice has been discussed as a future option.  Patient confirms that he is not interested in resuscitation nor would he want his life prolonged artificial machines.  He has a signed DNR order at home.  PLAN: - Continue current scope of treatment - Continue oxycodone  as needed  - Daily bowel regimen - DNR/DNI - Follow-up visit 1 month  Case  and plan discussed with Dr. Jacobo  Patient expressed understanding and was in agreement with this plan. He also understands that He can call the clinic at any time with any questions, concerns, or complaints.     Time Total: 15 minutes  Visit consisted of counseling and education dealing with the complex and emotionally intense issues of symptom management and palliative care in the setting of serious and potentially life-threatening illness.Greater than 50%  of this time was spent counseling and coordinating care related to the above assessment and plan.  Signed by: Fonda Mower, PhD, NP-C

## 2023-10-08 ENCOUNTER — Inpatient Hospital Stay

## 2023-10-08 ENCOUNTER — Ambulatory Visit

## 2023-10-08 DIAGNOSIS — C221 Intrahepatic bile duct carcinoma: Secondary | ICD-10-CM

## 2023-10-08 DIAGNOSIS — Z5112 Encounter for antineoplastic immunotherapy: Secondary | ICD-10-CM | POA: Diagnosis not present

## 2023-10-08 MED ORDER — DIPHENHYDRAMINE HCL 50 MG/ML IJ SOLN
25.0000 mg | Freq: Once | INTRAMUSCULAR | Status: AC
  Administered 2023-10-08: 25 mg via INTRAVENOUS
  Filled 2023-10-08: qty 1

## 2023-10-08 MED ORDER — ACETAMINOPHEN 325 MG PO TABS
650.0000 mg | ORAL_TABLET | Freq: Once | ORAL | Status: AC
  Administered 2023-10-08: 650 mg via ORAL
  Filled 2023-10-08: qty 2

## 2023-10-08 MED ORDER — SODIUM CHLORIDE 0.9% IV SOLUTION
250.0000 mL | INTRAVENOUS | Status: DC
  Administered 2023-10-08: 250 mL via INTRAVENOUS
  Filled 2023-10-08: qty 250

## 2023-10-08 MED ORDER — SODIUM CHLORIDE 0.9% FLUSH
10.0000 mL | INTRAVENOUS | Status: AC | PRN
  Administered 2023-10-08: 10 mL
  Filled 2023-10-08: qty 10

## 2023-10-08 NOTE — Patient Instructions (Signed)

## 2023-10-09 ENCOUNTER — Inpatient Hospital Stay

## 2023-10-09 LAB — BPAM RBC
Blood Product Expiration Date: 202510022359
ISSUE DATE / TIME: 202509100916
Unit Type and Rh: 202510022359
Unit Type and Rh: 5100

## 2023-10-09 LAB — TYPE AND SCREEN
ABO/RH(D): O POS
Antibody Screen: NEGATIVE
Unit division: 0

## 2023-10-10 ENCOUNTER — Other Ambulatory Visit

## 2023-10-10 ENCOUNTER — Ambulatory Visit

## 2023-10-10 ENCOUNTER — Ambulatory Visit: Admitting: Oncology

## 2023-10-13 ENCOUNTER — Other Ambulatory Visit

## 2023-10-13 ENCOUNTER — Ambulatory Visit: Admitting: Oncology

## 2023-10-13 ENCOUNTER — Encounter: Payer: Self-pay | Admitting: Nurse Practitioner

## 2023-10-13 ENCOUNTER — Other Ambulatory Visit: Payer: Self-pay | Admitting: *Deleted

## 2023-10-13 ENCOUNTER — Encounter: Payer: Self-pay | Admitting: Oncology

## 2023-10-13 DIAGNOSIS — C221 Intrahepatic bile duct carcinoma: Secondary | ICD-10-CM

## 2023-10-14 ENCOUNTER — Inpatient Hospital Stay

## 2023-10-14 ENCOUNTER — Ambulatory Visit

## 2023-10-14 ENCOUNTER — Inpatient Hospital Stay (HOSPITAL_BASED_OUTPATIENT_CLINIC_OR_DEPARTMENT_OTHER): Admitting: Oncology

## 2023-10-14 VITALS — BP 97/58

## 2023-10-14 VITALS — BP 125/75 | HR 94 | Temp 97.4°F | Resp 16 | Wt 150.0 lb

## 2023-10-14 DIAGNOSIS — C786 Secondary malignant neoplasm of retroperitoneum and peritoneum: Secondary | ICD-10-CM

## 2023-10-14 DIAGNOSIS — C221 Intrahepatic bile duct carcinoma: Secondary | ICD-10-CM

## 2023-10-14 DIAGNOSIS — Z5112 Encounter for antineoplastic immunotherapy: Secondary | ICD-10-CM | POA: Diagnosis not present

## 2023-10-14 LAB — MAGNESIUM: Magnesium: 1.9 mg/dL (ref 1.7–2.4)

## 2023-10-14 LAB — CBC WITH DIFFERENTIAL (CANCER CENTER ONLY)
Abs Immature Granulocytes: 0.14 K/uL — ABNORMAL HIGH (ref 0.00–0.07)
Basophils Absolute: 0 K/uL (ref 0.0–0.1)
Basophils Relative: 0 %
Eosinophils Absolute: 0 K/uL (ref 0.0–0.5)
Eosinophils Relative: 0 %
HCT: 27.3 % — ABNORMAL LOW (ref 39.0–52.0)
Hemoglobin: 8.9 g/dL — ABNORMAL LOW (ref 13.0–17.0)
Immature Granulocytes: 1 %
Lymphocytes Relative: 7 %
Lymphs Abs: 0.9 K/uL (ref 0.7–4.0)
MCH: 29.1 pg (ref 26.0–34.0)
MCHC: 32.6 g/dL (ref 30.0–36.0)
MCV: 89.2 fL (ref 80.0–100.0)
Monocytes Absolute: 1.6 K/uL — ABNORMAL HIGH (ref 0.1–1.0)
Monocytes Relative: 12 %
Neutro Abs: 10.8 K/uL — ABNORMAL HIGH (ref 1.7–7.7)
Neutrophils Relative %: 80 %
Platelet Count: 182 K/uL (ref 150–400)
RBC: 3.06 MIL/uL — ABNORMAL LOW (ref 4.22–5.81)
RDW: 15.1 % (ref 11.5–15.5)
WBC Count: 13.5 K/uL — ABNORMAL HIGH (ref 4.0–10.5)
nRBC: 0 % (ref 0.0–0.2)

## 2023-10-14 LAB — CMP (CANCER CENTER ONLY)
ALT: 34 U/L (ref 0–44)
AST: 54 U/L — ABNORMAL HIGH (ref 15–41)
Albumin: 2 g/dL — ABNORMAL LOW (ref 3.5–5.0)
Alkaline Phosphatase: 340 U/L — ABNORMAL HIGH (ref 38–126)
Anion gap: 8 (ref 5–15)
BUN: 18 mg/dL (ref 8–23)
CO2: 24 mmol/L (ref 22–32)
Calcium: 8.2 mg/dL — ABNORMAL LOW (ref 8.9–10.3)
Chloride: 103 mmol/L (ref 98–111)
Creatinine: 1 mg/dL (ref 0.61–1.24)
GFR, Estimated: >60 mL/min (ref >60)
Glucose, Bld: 99 mg/dL (ref 70–99)
Potassium: 3.4 mmol/L — ABNORMAL LOW (ref 3.5–5.1)
Sodium: 135 mmol/L (ref 135–145)
Total Bilirubin: 1.4 mg/dL — ABNORMAL HIGH (ref 0.0–1.2)
Total Protein: 5.8 g/dL — ABNORMAL LOW (ref 6.5–8.1)

## 2023-10-14 LAB — TSH: TSH: 5.698 u[IU]/mL — ABNORMAL HIGH (ref 0.350–4.500)

## 2023-10-14 LAB — SAMPLE TO BLOOD BANK

## 2023-10-14 MED ORDER — SODIUM CHLORIDE 0.9 % IV SOLN
12.5000 mg/m2 | Freq: Once | INTRAVENOUS | Status: AC
  Administered 2023-10-14: 23 mg via INTRAVENOUS
  Filled 2023-10-14: qty 23

## 2023-10-14 MED ORDER — SODIUM CHLORIDE 0.9 % IV SOLN
800.0000 mg/m2 | Freq: Once | INTRAVENOUS | Status: AC
  Administered 2023-10-14: 1482 mg via INTRAVENOUS
  Filled 2023-10-14: qty 38.98

## 2023-10-14 MED ORDER — SODIUM CHLORIDE 0.9 % IV SOLN
1500.0000 mg | Freq: Once | INTRAVENOUS | Status: AC
  Administered 2023-10-14: 1500 mg via INTRAVENOUS
  Filled 2023-10-14: qty 30

## 2023-10-14 MED ORDER — SODIUM CHLORIDE 0.9 % IV SOLN
INTRAVENOUS | Status: DC
  Filled 2023-10-14: qty 250

## 2023-10-14 MED ORDER — POTASSIUM CHLORIDE IN NACL 20-0.9 MEQ/L-% IV SOLN
Freq: Once | INTRAVENOUS | Status: AC
  Filled 2023-10-14: qty 1000

## 2023-10-14 MED ORDER — PALONOSETRON HCL INJECTION 0.25 MG/5ML
0.2500 mg | Freq: Once | INTRAVENOUS | Status: AC
  Administered 2023-10-14: 0.25 mg via INTRAVENOUS
  Filled 2023-10-14: qty 5

## 2023-10-14 MED ORDER — MAGNESIUM SULFATE 2 GM/50ML IV SOLN
2.0000 g | Freq: Once | INTRAVENOUS | Status: AC
  Administered 2023-10-14: 2 g via INTRAVENOUS
  Filled 2023-10-14: qty 50

## 2023-10-14 MED ORDER — APREPITANT 130 MG/18ML IV EMUL
130.0000 mg | Freq: Once | INTRAVENOUS | Status: AC
  Administered 2023-10-14: 130 mg via INTRAVENOUS
  Filled 2023-10-14: qty 18

## 2023-10-14 MED ORDER — DEXAMETHASONE SODIUM PHOSPHATE 10 MG/ML IJ SOLN
10.0000 mg | Freq: Once | INTRAMUSCULAR | Status: AC
  Administered 2023-10-14: 10 mg via INTRAVENOUS
  Filled 2023-10-14: qty 1

## 2023-10-14 MED ORDER — DEXAMETHASONE 4 MG PO TABS: 4.0000 mg | ORAL_TABLET | Freq: Every day | ORAL | 3 refills | Status: DC

## 2023-10-14 NOTE — Patient Instructions (Signed)
 [facility] - A DEPT OF [place]  Discharge Instructions: Thank you for choosing [facility] to provide your oncology and hematology care.  If you have a lab appointment with the Cancer Center, please go directly to the Cancer Center and check in at the registration area.  Wear comfortable clothing and clothing appropriate for easy access to any Portacath or PICC line.   We strive to give you quality time with your provider. You may need to reschedule your appointment if you arrive late (15 or more minutes).  Arriving late affects you and other patients whose appointments are after yours.  Also, if you miss three or more appointments without notifying the office, you may be dismissed from the clinic at the provider's discretion.      For prescription refill requests, have your pharmacy contact our office and allow 72 hours for refills to be completed.    Today you received the following chemotherapy and/or immunotherapy agents CISPLATIN , GEMZAR  and IMFINZI       To help prevent nausea and vomiting after your treatment, we encourage you to take your nausea medication as directed.  BELOW ARE SYMPTOMS THAT SHOULD BE REPORTED IMMEDIATELY: *FEVER GREATER THAN 100.4 F (38 C) OR HIGHER *CHILLS OR SWEATING *NAUSEA AND VOMITING THAT IS NOT CONTROLLED WITH YOUR NAUSEA MEDICATION *UNUSUAL SHORTNESS OF BREATH *UNUSUAL BRUISING OR BLEEDING *URINARY PROBLEMS (pain or burning when urinating, or frequent urination) *BOWEL PROBLEMS (unusual diarrhea, constipation, pain near the anus) TENDERNESS IN MOUTH AND THROAT WITH OR WITHOUT PRESENCE OF ULCERS (sore throat, sores in mouth, or a toothache) UNUSUAL RASH, SWELLING OR PAIN  UNUSUAL VAGINAL DISCHARGE OR ITCHING   Items with * indicate a potential emergency and should be followed up as soon as possible or go to the Emergency Department if any problems should occur.  Please show the CHEMOTHERAPY ALERT CARD  or IMMUNOTHERAPY ALERT CARD at check-in to the Emergency Department and triage nurse.  Should you have questions after your visit or need to cancel or reschedule your appointment, please contact [facility] - A DEPT OF [facility]  [phone] and follow the prompts.  Office hours are 8:00 a.m. to 4:30 p.m. Monday - Friday. Please note that voicemails left after 4:00 p.m. may not be returned until the following business day.  We are closed weekends and major holidays. You have access to a nurse at all times for urgent questions. Please call the main number to the clinic [phone] and follow the prompts.  For any non-urgent questions, you may also contact your provider using MyChart. We now offer e-Visits for anyone 36 and older to request care online for non-urgent symptoms. For details visit [email or web address].   Also download the MyChart app! Go to the app store, search MyChart, open the app, select [place], and log in with your MyChart username and password.    Cisplatin  Injection What is this medication? CISPLATIN  (SIS pla tin) treats some types of cancer. It works by slowing down the growth of cancer cells. This medicine may be used for other purposes; ask your health care provider or pharmacist if you have questions. COMMON BRAND NAME(S): Platinol , Platinol  -AQ What should I tell my care team before I take this medication? They need to know if you have any of these conditions: Eye disease, vision problems Hearing problems Kidney disease Low blood counts, such as low white cells, platelets, or red blood  cells Tingling of the fingers or toes, or other nerve disorder An unusual or allergic reaction to cisplatin , carboplatin, oxaliplatin, other medications, foods, dyes, or preservatives If you or your partner are pregnant or trying to get pregnant Breast-feeding How should I use this medication? This medication is injected into a vein. It  is given by your care team in a hospital or clinic setting. Talk to your care team about the use of this medication in children. Special care may be needed. Overdosage: If you think you have taken too much of this medicine contact a poison control center or emergency room at once. NOTE: This medicine is only for you. Do not share this medicine with others. What if I miss a dose? Keep appointments for follow-up doses. It is important not to miss your dose. Call your care team if you are unable to keep an appointment. What may interact with this medication? Do not take this medication with any of the following: Live virus vaccines This medication may also interact with the following: Certain antibiotics, such as amikacin, gentamicin, neomycin, polymyxin B, streptomycin, tobramycin, vancomycin Foscarnet This list may not describe all possible interactions. Give your health care provider a list of all the medicines, herbs, non-prescription drugs, or dietary supplements you use. Also tell them if you smoke, drink alcohol, or use illegal drugs. Some items may interact with your medicine. What should I watch for while using this medication? Your condition will be monitored carefully while you are receiving this medication. You may need blood work done while taking this medication. This medication may make you feel generally unwell. This is not uncommon, as chemotherapy can affect healthy cells as well as cancer cells. Report any side effects. Continue your course of treatment even though you feel ill unless your care team tells you to stop. This medication may increase your risk of getting an infection. Call your care team for advice if you get a fever, chills, sore throat, or other symptoms of a cold or flu. Do not treat yourself. Try to avoid being around people who are sick. Avoid taking medications that contain aspirin , acetaminophen , ibuprofen , naproxen, or ketoprofen unless instructed by your care  team. These medications may hide a fever. This medication may increase your risk to bruise or bleed. Call your care team if you notice any unusual bleeding. Be careful brushing or flossing your teeth or using a toothpick because you may get an infection or bleed more easily. If you have any dental work done, tell your dentist you are receiving this medication. Drink fluids as directed while you are taking this medication. This will help protect your kidneys. Call your care team if you get diarrhea. Do not treat yourself. Talk to your care team if you or your partner wish to become pregnant or think you might be pregnant. This medication can cause serious birth defects if taken during pregnancy and for 14 months after the last dose. A negative pregnancy test is required before starting this medication. A reliable form of contraception is recommended while taking this medication and for 14 months after the last dose. Talk to your care team about effective forms of contraception. Do not father a child while taking this medication and for 11 months after the last dose. Use a condom during sex during this time period. Do not breast-feed while taking this medication. This medication may cause infertility. Talk to your care team if you are concerned about your fertility. What side effects may I notice  from receiving this medication? Side effects that you should report to your care team as soon as possible: Allergic reactions--skin rash, itching, hives, swelling of the face, lips, tongue, or throat Eye pain, change in vision, vision loss Hearing loss, ringing in ears Infection--fever, chills, cough, sore throat, wounds that don't heal, pain or trouble when passing urine, general feeling of discomfort or being unwell Kidney injury--decrease in the amount of urine, swelling of the ankles, hands, or feet Low red blood cell level--unusual weakness or fatigue, dizziness, headache, trouble breathing Painful  swelling, warmth, or redness of the skin, blisters or sores at the infusion site Pain, tingling, or numbness in the hands or feet Unusual bruising or bleeding Side effects that usually do not require medical attention (report to your care team if they continue or are bothersome): Hair loss Nausea Vomiting This list may not describe all possible side effects. Call your doctor for medical advice about side effects. You may report side effects to FDA at 1-800-FDA-1088. Where should I keep my medication? This medication is given in a hospital or clinic. It will not be stored at home. NOTE: This sheet is a summary. It may not cover all possible information. If you have questions about this medicine, talk to your doctor, pharmacist, or health care provider.  2024 Elsevier/Gold Standard (2021-05-18 00:00:00)  Gemcitabine  Injection What is this medication? GEMCITABINE  (jem SYE ta been) treats some types of cancer. It works by slowing down the growth of cancer cells. This medicine may be used for other purposes; ask your health care provider or pharmacist if you have questions. COMMON BRAND NAME(S): Gemzar , Infugem  What should I tell my care team before I take this medication? They need to know if you have any of these conditions: Blood disorders Infection Kidney disease Liver disease Lung or breathing disease, such as asthma or COPD Recent or ongoing radiation therapy An unusual or allergic reaction to gemcitabine , other medications, foods, dyes, or preservatives If you or your partner are pregnant or trying to get pregnant Breast-feeding How should I use this medication? This medication is injected into a vein. It is given by your care team in a hospital or clinic setting. Talk to your care team about the use of this medication in children. Special care may be needed. Overdosage: If you think you have taken too much of this medicine contact a poison control center or emergency room at  once. NOTE: This medicine is only for you. Do not share this medicine with others. What if I miss a dose? Keep appointments for follow-up doses. It is important not to miss your dose. Call your care team if you are unable to keep an appointment. What may interact with this medication? Interactions have not been studied. This list may not describe all possible interactions. Give your health care provider a list of all the medicines, herbs, non-prescription drugs, or dietary supplements you use. Also tell them if you smoke, drink alcohol, or use illegal drugs. Some items may interact with your medicine. What should I watch for while using this medication? Your condition will be monitored carefully while you are receiving this medication. This medication may make you feel generally unwell. This is not uncommon, as chemotherapy can affect healthy cells as well as cancer cells. Report any side effects. Continue your course of treatment even though you feel ill unless your care team tells you to stop. In some cases, you may be given additional medications to help with side effects. Follow all  directions for their use. This medication may increase your risk of getting an infection. Call your care team for advice if you get a fever, chills, sore throat, or other symptoms of a cold or flu. Do not treat yourself. Try to avoid being around people who are sick. This medication may increase your risk to bruise or bleed. Call your care team if you notice any unusual bleeding. Be careful brushing or flossing your teeth or using a toothpick because you may get an infection or bleed more easily. If you have any dental work done, tell your dentist you are receiving this medication. Avoid taking medications that contain aspirin , acetaminophen , ibuprofen , naproxen, or ketoprofen unless instructed by your care team. These medications may hide a fever. Talk to your care team if you or your partner wish to become pregnant  or think you might be pregnant. This medication can cause serious birth defects if taken during pregnancy and for 6 months after the last dose. A negative pregnancy test is required before starting this medication. A reliable form of contraception is recommended while taking this medication and for 6 months after the last dose. Talk to your care team about effective forms of contraception. Do not father a child while taking this medication and for 3 months after the last dose. Use a condom while having sex during this time period. Do not breastfeed while taking this medication and for at least 1 week after the last dose. This medication may cause infertility. Talk to your care team if you are concerned about your fertility. What side effects may I notice from receiving this medication? Side effects that you should report to your care team as soon as possible: Allergic reactions--skin rash, itching, hives, swelling of the face, lips, tongue, or throat Capillary leak syndrome--stomach or muscle pain, unusual weakness or fatigue, feeling faint or lightheaded, decrease in the amount of urine, swelling of the ankles, hands, or feet, trouble breathing Infection--fever, chills, cough, sore throat, wounds that don't heal, pain or trouble when passing urine, general feeling of discomfort or being unwell Liver injury--right upper belly pain, loss of appetite, nausea, light-colored stool, dark yellow or brown urine, yellowing skin or eyes, unusual weakness or fatigue Low red blood cell level--unusual weakness or fatigue, dizziness, headache, trouble breathing Lung injury--shortness of breath or trouble breathing, cough, spitting up blood, chest pain, fever Stomach pain, bloody diarrhea, pale skin, unusual weakness or fatigue, decrease in the amount of urine, which may be signs of hemolytic uremic syndrome Sudden and severe headache, confusion, change in vision, seizures, which may be signs of posterior reversible  encephalopathy syndrome (PRES) Unusual bruising or bleeding Side effects that usually do not require medical attention (report to your care team if they continue or are bothersome): Diarrhea Drowsiness Hair loss Nausea Pain, redness, or swelling with sores inside the mouth or throat Vomiting This list may not describe all possible side effects. Call your doctor for medical advice about side effects. You may report side effects to FDA at 1-800-FDA-1088. Where should I keep my medication? This medication is given in a hospital or clinic. It will not be stored at home. NOTE: This sheet is a summary. It may not cover all possible information. If you have questions about this medicine, talk to your doctor, pharmacist, or health care provider.  2024 Elsevier/Gold Standard (2021-05-22 00:00:00)  Durvalumab  Injection What is this medication? DURVALUMAB  (dur VAL ue mab) treats some types of cancer. It works by helping your immune system slow  or stop the spread of cancer cells. It is a monoclonal antibody. This medicine may be used for other purposes; ask your health care provider or pharmacist if you have questions. COMMON BRAND NAME(S): IMFINZI  What should I tell my care team before I take this medication? They need to know if you have any of these conditions: Allogeneic stem cell transplant (uses someone else's stem cells) Autoimmune diseases, such as Crohn disease, ulcerative colitis, lupus History of chest radiation Nervous system problems, such as Guillain-Barre syndrome, myasthenia gravis Organ transplant An unusual or allergic reaction to durvalumab , other medications, foods, dyes, or preservatives Pregnant or trying to get pregnant Breast-feeding How should I use this medication? This medication is infused into a vein. It is given by your care team in a hospital or clinic setting. A special MedGuide will be given to you before each treatment. Be sure to read this information carefully  each time. Talk to your care team about the use of this medication in children. Special care may be needed. Overdosage: If you think you have taken too much of this medicine contact a poison control center or emergency room at once. NOTE: This medicine is only for you. Do not share this medicine with others. What if I miss a dose? Keep appointments for follow-up doses. It is important not to miss your dose. Call your care team if you are unable to keep an appointment. What may interact with this medication? Interactions have not been studied. This list may not describe all possible interactions. Give your health care provider a list of all the medicines, herbs, non-prescription drugs, or dietary supplements you use. Also tell them if you smoke, drink alcohol, or use illegal drugs. Some items may interact with your medicine. What should I watch for while using this medication? Your condition will be monitored carefully while you are receiving this medication. You may need blood work while taking this medication. This medication may cause serious skin reactions. They can happen weeks to months after starting the medication. Contact your care team right away if you notice fevers or flu-like symptoms with a rash. The rash may be red or purple and then turn into blisters or peeling of the skin. You may also notice a red rash with swelling of the face, lips, or lymph nodes in your neck or under your arms. Tell your care team right away if you have any change in your eyesight. Talk to your care team if you may be pregnant. Serious birth defects can occur if you take this medication during pregnancy and for 3 months after the last dose. You will need a negative pregnancy test before starting this medication. Contraception is recommended while taking this medication and for 3 months after the last dose. Your care team can help you find the option that works for you. Do not breastfeed while taking this  medication and for 3 months after the last dose. What side effects may I notice from receiving this medication? Side effects that you should report to your care team as soon as possible: Allergic reactions--skin rash, itching, hives, swelling of the face, lips, tongue, or throat Dry cough, shortness of breath or trouble breathing Eye pain, redness, irritation, or discharge with blurry or decreased vision Heart muscle inflammation--unusual weakness or fatigue, shortness of breath, chest pain, fast or irregular heartbeat, dizziness, swelling of the ankles, feet, or hands Hormone gland problems--headache, sensitivity to light, unusual weakness or fatigue, dizziness, fast or irregular heartbeat, increased sensitivity to cold  or heat, excessive sweating, constipation, hair loss, increased thirst or amount of urine, tremors or shaking, irritability Infusion reactions--chest pain, shortness of breath or trouble breathing, feeling faint or lightheaded Kidney injury (glomerulonephritis)--decrease in the amount of urine, red or dark brown urine, foamy or bubbly urine, swelling of the ankles, hands, or feet Liver injury--right upper belly pain, loss of appetite, nausea, light-colored stool, dark yellow or brown urine, yellowing skin or eyes, unusual weakness or fatigue Pain, tingling, or numbness in the hands or feet, muscle weakness, change in vision, confusion or trouble speaking, loss of balance or coordination, trouble walking, seizures Rash, fever, and swollen lymph nodes Redness, blistering, peeling, or loosening of the skin, including inside the mouth Sudden or severe stomach pain, bloody diarrhea, fever, nausea, vomiting Side effects that usually do not require medical attention (report these to your care team if they continue or are bothersome): Bone, joint, or muscle pain Diarrhea Fatigue Loss of appetite Nausea Skin rash This list may not describe all possible side effects. Call your doctor for  medical advice about side effects. You may report side effects to FDA at 1-800-FDA-1088. Where should I keep my medication? This medication is given in a hospital or clinic. It will not be stored at home. NOTE: This sheet is a summary. It may not cover all possible information. If you have questions about this medicine, talk to your doctor, pharmacist, or health care provider.  2024 Elsevier/Gold Standard (2021-05-29 00:00:00)

## 2023-10-14 NOTE — Progress Notes (Signed)
 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    INTERVAL HISTORY: Patient returns to clinic today for repeat laboratory work, further evaluation, and consideration of cycle 3, day 1 of split dose cisplatin , gemcitabine , and durvalumab .  He continues to have chronic weakness and fatigue.  He has a poor appetite and weight loss.  He has no neurologic complaints.  He denies any recent fevers or illnesses.  He has no chest pain, shortness of breath, cough, or hemoptysis.  He does not complain of abdominal pain today.  He denies any nausea, vomiting, constipation, or diarrhea.  He has no melena or hematochezia today.  He has no urinary complaints.  Patient offers no further specific complaints today.  REVIEW OF SYSTEMS:   Review of Systems  Constitutional:  Positive for malaise/fatigue and weight loss. Negative for fever.  Respiratory: Negative.  Negative for cough, hemoptysis and shortness of breath.   Cardiovascular: Negative.  Negative for chest pain and leg swelling.  Gastrointestinal: Negative.  Negative for abdominal pain, blood in stool, constipation, diarrhea, melena, nausea and vomiting.  Genitourinary: Negative.  Negative for dysuria.  Musculoskeletal: Negative.  Negative for back pain.  Skin: Negative.  Negative for rash.  Neurological:  Positive for weakness. Negative for dizziness, focal weakness and headaches.  Psychiatric/Behavioral:  Negative.  The patient is not nervous/anxious.     As per HPI. Otherwise, a complete review of systems is negative.  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   CAD (coronary artery disease) 09/17/2021   a.) R/LHC 09/17/2021: 30% pRCA, 40% dRCA, 50% pLCx, 30% dLCx, 50% pLAD, 50% mLAD, 50% dLAD --> med mgmt   Cardiac murmur    Cervical spondylosis    Cholelithiasis    CRAO (central retinal artery occlusion), 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   CVA (cerebral vascular accident) (HCC) 03/13/2018   a.) MRI brain 03/14/2018 --> acute infarct in the posterior RIGHT frontal lobe; small chronic cerebellar infarct --> Tx'd with TPA with (+) improvement/resolution of symptoms   Diastolic dysfunction    a.) TTE 01/31/2015: EF 55-60%, MAC, G1DD; b.) TTE 03/14/2018: EF 60-65%, mild LAE, sev MAC, sev AoV cal with mod AS (MPG 14) G2DD; c.) TTE 09/13/2019: EF 55-60%, mild LAE, mild-mod MR, mild AS (MPG 12.5), G2DD; d.) TTE 08/16/2021: EF 60-65%, mild LVH, mod MAC, mild MR, mod AS (MPG 18.5), G1DD; e.) TTE 12/05/2021: EF 60-65%, mod MAC, s/p TAVR (well seated/functioning valve), mild MR, G1DD   GERD (gastroesophageal reflux disease)    History of chicken pox    History of measles    Hyperlipidemia    Hypertension    LBBB (left bundle branch block)    Long term current use of  anticoagulant    a.) apixaban    Low back pain radiating to left lower extremity    PAF (paroxysmal atrial fibrillation) (HCC)    a.) CHA2DS2VASc = 6 (age x2, HTN, CVA x2, vascular disease history);  b.) rate/rhythm maintained on oral metoprolol  succinate; chronically anticoagulated with apixaban    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   a.) 26mm S3UR via RIGHT TF approach   Skin cancer     PAST SURGICAL HISTORY: Past Surgical History:  Procedure Laterality Date   COLONOSCOPY  09/26/2023   Procedure: COLONOSCOPY;  Surgeon: Onita Elspeth Sharper, DO;  Location: [facility] ENDOSCOPY;  Service: Gastroenterology;;   ENTEROSCOPY N/A 09/28/2023   Procedure: ENTEROSCOPY;  Surgeon: Unk Corinn Skiff, MD;  Location: [facility] ENDOSCOPY;  Service: Gastroenterology;  Laterality: N/A;   ESOPHAGOGASTRODUODENOSCOPY N/A 09/26/2023   Procedure: EGD (ESOPHAGOGASTRODUODENOSCOPY);  Surgeon: Onita Elspeth Sharper, DO;  Location: [facility] ENDOSCOPY;  Service: Gastroenterology;  Laterality: N/A;   GIVENS CAPSULE STUDY  09/26/2023   Procedure: IMAGING PROCEDURE, GI TRACT, INTRALUMINAL, VIA CAPSULE;  Surgeon: Onita Elspeth Sharper, DO;  Location: [facility] ENDOSCOPY;  Service: Gastroenterology;;   INTRAOPERATIVE TRANSTHORACIC ECHOCARDIOGRAM N/A 11/06/2021   Procedure: INTRAOPERATIVE TRANSTHORACIC ECHOCARDIOGRAM;  Surgeon: Verlin Lonni BIRCH, MD;  Location: [facility] OR;  Service: Open Heart Surgery;  Laterality: N/A;   IR IMAGING GUIDED PORT INSERTION  08/07/2023   RIGHT/LEFT HEART CATH AND CORONARY ANGIOGRAPHY N/A 09/17/2021   Procedure: RIGHT/LEFT HEART CATH AND CORONARY ANGIOGRAPHY;  Surgeon: Verlin Lonni BIRCH, MD;  Location: [facility] INVASIVE CV LAB;  Service: Cardiovascular;  Laterality: N/A;   ROBOTIC ASSISTED LAPAROSCOPIC CHOLECYSTECTOMY  07/24/2022   SKIN CANCER EXCISION  07/2020   TRANSCATHETER AORTIC VALVE REPLACEMENT, TRANSFEMORAL N/A 11/06/2021   Procedure: Transcatheter Aortic Valve Replacement, Transfemoral;  Surgeon: Verlin Lonni BIRCH, MD;  Location: [facility] OR;  Service: Open Heart Surgery;  Laterality: N/A;    FAMILY HISTORY: Family History  Problem Relation Age of Onset   Arrhythmia Mother        A-Fib   Lung cancer Father    Diabetes Son    Colon cancer Neg Hx    Prostate cancer Neg Hx     ADVANCED DIRECTIVES  (Y/N):  N  HEALTH MAINTENANCE: Social History   Tobacco Use   Smoking status: Former    Current packs/day: 0.00    Average packs/day: 1 pack/day for 20.0 years (20.0 ttl pk-yrs)    Types: Cigarettes    Start date: 03/12/1963    Quit date: 03/12/1983    Years since quitting: 40.6    Passive exposure: Past   Smokeless tobacco: Never  Vaping Use   Vaping status: Never Used  Substance Use Topics   Alcohol use: No   Drug use: No     Colonoscopy:  PAP:  Bone density:  Lipid panel:  No Known Allergies  Current Outpatient Medications  Medication Sig Dispense Refill   amLODipine  (NORVASC ) 10 MG tablet Take 1 tablet (10 mg total) by mouth daily. 90 tablet 3   atorvastatin  (LIPITOR) 80 MG tablet Take 1 tablet (80 mg total) by mouth daily. 90 tablet 3   Cyanocobalamin (B-12) 3000 MCG CAPS Take 3,000 mcg by mouth daily as needed (energy).     lidocaine -prilocaine  (EMLA ) cream Apply to affected area once 30 g 3   metoprolol  succinate (TOPROL -XL) 25 MG 24 hr tablet TAKE ONE AND ONE-HALF TABLETS DAILY 45 tablet 3   ondansetron  (ZOFRAN ) 8 MG tablet Take 1 tablet (8 mg total) by mouth every 8 (eight)  hours as needed for nausea or vomiting. Start on the third day after cisplatin . 60 tablet 2   oxyCODONE  (ROXICODONE ) 5 MG immediate release tablet Take 1 tablet (5 mg total) by mouth every 6 (six) hours as needed. 90 tablet 0   polyethylene glycol powder (GLYCOLAX /MIRALAX ) 17 GM/SCOOP powder Take 17 g by mouth as directed. 510 g 3   prochlorperazine  (COMPAZINE ) 10 MG tablet Take 1 tablet (10 mg total) by mouth every 6 (six) hours as needed (Nausea or vomiting). 60 tablet 2   senna (SENOKOT) 8.6 MG TABS tablet Take 2 tablets (17.2 mg total) by mouth 2 (two) times daily as needed for mild constipation or moderate constipation. 120 tablet 3   traZODone  (DESYREL ) 50 MG tablet TAKE 1 TABLET BY MOUTH AT BEDTIME AS NEEDED FOR SLEEP 90 tablet 0   No current facility-administered medications for this  visit.    OBJECTIVE: Vitals:   10/14/23 0832  BP: 125/75  Pulse: 94  Resp: 16  Temp: (!) 97.4 F (36.3 C)  SpO2: 99%     Body mass index is 24.96 kg/m.    ECOG FS:1 - Symptomatic but completely ambulatory  General: Well-developed, well-nourished, no acute distress. Eyes: Pink conjunctiva, anicteric sclera. HEENT: Normocephalic, moist mucous membranes. Lungs: No audible wheezing or coughing. Heart: Regular rate and rhythm. Abdomen: Soft, nontender, no obvious distention. Musculoskeletal: No edema, cyanosis, or clubbing. Neuro: Alert, answering all questions appropriately. Cranial nerves grossly intact. Skin: No rashes or petechiae noted. Psych: Normal affect.  LAB RESULTS:  Lab Results  Component Value Date   NA 135 10/14/2023   K 3.4 (L) 10/14/2023   CL 103 10/14/2023   CO2 24 10/14/2023   GLUCOSE 99 10/14/2023   BUN 18 10/14/2023   CREATININE 1.00 10/14/2023   CALCIUM  8.2 (L) 10/14/2023   PROT 5.8 (L) 10/14/2023   ALBUMIN 2.0 (L) 10/14/2023   AST 54 (H) 10/14/2023   ALT 34 10/14/2023   ALKPHOS 340 (H) 10/14/2023   BILITOT 1.4 (H) 10/14/2023   GFRNONAA >60 10/14/2023   GFRAA 81 08/25/2019    Lab Results  Component Value Date   WBC 13.5 (H) 10/14/2023   NEUTROABS 10.8 (H) 10/14/2023   HGB 8.9 (L) 10/14/2023   HCT 27.3 (L) 10/14/2023   MCV 89.2 10/14/2023   PLT 182 10/14/2023     STUDIES: CT ENTERO ABD/PELVIS W CONTAST Result Date: 09/29/2023 EXAM: CT ABDOMEN AND PELVIS WITH CONTRAST (ENTEROGRAPHY) 09/29/2023 11:04:29 AM TECHNIQUE: CT of the abdomen and pelvis was performed after the administration of intravenous contrast and negative oral contrast. Automated exposure control, iterative reconstruction, and/or weight based adjustment of the mA/kV was utilized to reduce the radiation dose to as low as reasonably achievable. COMPARISON: 07/10/2023 CT abdomen/pelvis, 09/28/2023 abdominal radiograph, and 07/15/2023 PET/CT. CLINICAL HISTORY: GI bleed. 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 FINDINGS: LOWER CHEST: Numerous (greater than 10) solid pulmonary nodules scattered throughout both lung bases, new since 07/10/2023 CT. Largest 0.9 cm in the right lower lobe on series 4 image 9 and 1.0 cm in the medial left upper lobe on image 2. LIVER: Innumerable (greater than 20) hypodense liver masses scattered throughout the liver, increased in size and number. Representative 3.4 x 3.3 cm posterior right liver mass on series 2 image 24, increased from 1.6 x 1.5 cm. Representative 2.3 x 1.9 cm anterior left liver mass on image 22, increased from 1.0 x 0.9 cm. GALLBLADDER AND BILE DUCTS: Cholecystectomy. No biliary ductal dilatation. SPLEEN: No acute  abnormality. PANCREAS: No acute abnormality. ADRENAL GLANDS: No acute abnormality. KIDNEYS, URETERS AND BLADDER: No stones in the kidneys or ureters. No hydronephrosis. No perinephric or periureteral stranding. Urinary bladder is unremarkable. STOMACH AND BOWEL: New irregular 3.9 x 2.6 cm small bowel mass within a right abdominal small bowel loop on series 2 image 69. No dilated small bowel loops. Diminutive appendix. No large bowel wall thickening or diverticulosis. Metallic density 2.0 x 1.4 cm structure within the mid rectal lumen on series 5 image 84. No active intraluminal contrast extravasation. PERITONEUM AND RETROPERITONEUM: Omental soft tissue caking is substantially decreased with dominant residual 1.7 x 1.3 cm right omental nodule on series 2 image 54. New mesenteric soft tissue implants, largest 1.8 x 1.6 cm on image 67. Upper right pelvic sidewall 2.5 x 2.3 cm implant on image 74, increased from 1.1 x 0.9 cm. VASCULATURE: Atherosclerotic nonaneurysmal abdominal aorta. Superior approach central venous catheter tip seen at the cavoatrial junction. LYMPH NODES: Newly mildly enlarged 1.1 cm porta hepatis lymph node noted on series 2 image 33. REPRODUCTIVE ORGANS: Stable mild prostatomegaly. BONES AND  SOFT TISSUES: Moderate thoracolumbar spondylosis. IMPRESSION: 1. New irregular 3.9 x 2.6 cm small bowel mass within a right abdominal small bowel loop compatible with small bowel metastasis, likely the source of GI bleeding. No active GI bleed on CT. 2. Interval progression of advanced liver metastatic disease. 3. Innumerable new lung metastases at the lung bases. 4. Mixed changes in the peritoneal metastatic disease, with decreased omental caking, but new/increased metastatic implants in the mesentery and right pelvic sidewall. 5. New metastatic adenopathy to the porta hepatis. Electronically signed by: Selinda Blue MD 09/29/2023 11:37 AM EDT RP Workstation: [id number]   DG Abd 1 View Result Date: 09/28/2023 CLINICAL DATA:  Nausea. EXAM: ABDOMEN - 1 VIEW COMPARISON:  Radiograph yesterday FINDINGS: Retained endoscopy capsule projects over the right pelvis, unchanged. No small bowel distension or evidence of obstruction. Mild gaseous distension of transverse colon. No significant formed colonic stool. TAVR partially included. Vascular calcifications are seen. IMPRESSION: 1. Retained endoscopy capsule projects over the right pelvis, unchanged. 2. Mild gaseous distension of transverse colon. No evidence of bowel obstruction. Electronically Signed   By: Andrea Gasman M.D.   On: 09/28/2023 23:45   DG Abd 1 View Result Date: 09/27/2023 EXAM: 1 VIEW XRAY OF THE ABDOMEN 09/27/2023 02:01:36 PM COMPARISON: None available. CLINICAL HISTORY: Pt had video capsule study yesterday, capsule did not reach cecum on images, rule out any retained capsule. FINDINGS: BOWEL: Nonobstructive bowel gas pattern. SOFT TISSUES: No opaque urinary calculi. Endoscopy capsule in right lower quadrant. TAVR and right chest power port noted. BONES: No acute osseous abnormality. Probable phleboliths in the pelvis. IMPRESSION: 1. Endoscopy capsule in the right lower quadrant, consistent with retained capsule. 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  Luminal evaluation during his admission did not reveal a source of bleeding, but CT scan on September 29, 2023 revealed significant progression of disease as well as a small bowel mass that is likely the source.  Previous biopsy revealed a poorly differentiated carcinoma consistent with either upper GI or pancreatobiliary origin.  Tumor markers are negative.  NGS testing suggested tissue of origin is possibly cholangiocarcinoma (82%) as well as a high  tumor mutational burden indicating that immunotherapy would be of benefit.  Patient is receiving split dose cisplatin , gemcitabine , and durvalumab  on day 1 with cisplatin  and gemcitabine  only on day 8 followed by G-CSF support.  This is a 21-day cycle.  Patient has had port placed.  Gemcitabine  is also dose reduced.  Hospice was previously discussed, but patient wishes to attempt additional treatment. We have discussed the possibility of using single agent durvalumab  if his cytopenias continue to be an issue.  Proceed with cycle 3, day 1 of treatment today.  Return to clinic in 1 week for further evaluation and consideration of cycle 3, day 8. Poor appetite/weight loss: Patient was given a prescription for dexamethasone  today. Abdominal pain: Patient does not complain of this today.  Continue oxycodone  as prescribed. Anemia: Hemoglobin improved to 8.9 with transfusion.  Luminal evaluation and imaging results as above.  Eliquis  has been discontinued.   Leukocytosis: Chronic and unchanged.  Likely reactive. Thrombocytopenia: Resolved. Hyponatremia: Resolved. Hypokalemia: Mild, monitor. Hyperbilirubinemia: Mild, monitor.  Patient's total bilirubin is 1.4.    Patient  expressed understanding and was in agreement with this plan. He also understands that He can call clinic at any time with any questions, concerns, or complaints.    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

## 2023-10-15 ENCOUNTER — Other Ambulatory Visit

## 2023-10-15 LAB — T4: T4, Total: 8.1 ug/dL (ref 4.5–12.0)

## 2023-10-16 ENCOUNTER — Ambulatory Visit: Admitting: Oncology

## 2023-10-16 ENCOUNTER — Ambulatory Visit (INDEPENDENT_AMBULATORY_CARE_PROVIDER_SITE_OTHER): Admitting: Family

## 2023-10-16 ENCOUNTER — Ambulatory Visit

## 2023-10-16 ENCOUNTER — Encounter: Payer: Self-pay | Admitting: Family

## 2023-10-16 ENCOUNTER — Other Ambulatory Visit

## 2023-10-16 ENCOUNTER — Encounter: Payer: Self-pay | Admitting: Oncology

## 2023-10-16 VITALS — BP 116/64 | HR 74 | Temp 98.3°F | Ht 65.0 in | Wt 155.0 lb

## 2023-10-16 DIAGNOSIS — Z87898 Personal history of other specified conditions: Secondary | ICD-10-CM

## 2023-10-16 DIAGNOSIS — Z8719 Personal history of other diseases of the digestive system: Secondary | ICD-10-CM | POA: Diagnosis not present

## 2023-10-16 DIAGNOSIS — Z952 Presence of prosthetic heart valve: Secondary | ICD-10-CM

## 2023-10-16 NOTE — Progress Notes (Signed)
 Established Patient Office Visit  Subjective:      CC:  Chief Complaint  Patient presents with   Establish Care    HPI: Jon Mejia is a 83 y.o. male presenting on 10/16/2023 for Establish Care .  Discussed the use of AI scribe software for clinical note transcription with the patient, who gave verbal consent to proceed.  History of Present Illness Jon Mejia is an 83 year old male with metastatic cholangiocarcinoma who presents for follow-up after recent chemotherapy.  He is undergoing a 21-day cycle of chemotherapy with cisplatin , Gemzar , and durvalumab , with a session on October 14, 2023. He has poor appetite and weight loss, managed with dexamethasone . Abdominal pain is present and managed with oxycodone  as needed. A CT scan on September 29, 2023, showed significant disease progression and a small bowel mass. He recently received a blood transfusion, improving his hemoglobin to 8.9.  He has a history of atrial fibrillation managed with metoprolol  37.5 mg. He was previously on Eliquis  but discontinued it due to gastrointestinal bleeding. He has a history of aortic stenosis and underwent TAVR in October 2023. His last echocardiogram in October 2024 showed good cardiac function with an ejection fraction of 55-60%.  He has high cholesterol managed with atorvastatin  and experiences occasional back pain. He does not recall any episodes of syncope. He has a history of stomach bleeding, which is currently inactive, and experiences occasional heartburn. His gallbladder has been removed.  No alcohol or illegal drug use. He uses oxygen as prescribed. He has two children, including a daughter named Melissa.  Current medications include metoprolol , atorvastatin , Miralax , Senokot as needed, oxycodone  as needed, Compazine  as needed, trazodone  as needed for sleep, and dexamethasone . He has a latex allergy.         Social history:  Relevant past medical, surgical, family and  social history reviewed and updated as indicated. Interim medical history since our last visit reviewed.  Allergies and medications reviewed and updated.  DATA REVIEWED: CHART IN EPIC     ROS: Negative unless specifically indicated above in HPI.    Current Outpatient Medications:    atorvastatin  (LIPITOR) 80 MG tablet, Take 1 tablet (80 mg total) by mouth daily., Disp: 90 tablet, Rfl: 3   dexamethasone  (DECADRON ) 4 MG tablet, Take 1 tablet (4 mg total) by mouth daily., Disp: 30 tablet, Rfl: 3   lidocaine -prilocaine  (EMLA ) cream, Apply to affected area once, Disp: 30 g, Rfl: 3   metoprolol  succinate (TOPROL -XL) 25 MG 24 hr tablet, TAKE ONE AND ONE-HALF TABLETS DAILY, Disp: 45 tablet, Rfl: 3   ondansetron  (ZOFRAN ) 8 MG tablet, Take 1 tablet (8 mg total) by mouth every 8 (eight) hours as needed for nausea or vomiting. Start on the third day after cisplatin ., Disp: 60 tablet, Rfl: 2   oxyCODONE  (ROXICODONE ) 5 MG immediate release tablet, Take 1 tablet (5 mg total) by mouth every 6 (six) hours as needed., Disp: 90 tablet, Rfl: 0   polyethylene glycol powder (GLYCOLAX /MIRALAX ) 17 GM/SCOOP powder, Take 17 g by mouth as directed., Disp: 510 g, Rfl: 3   prochlorperazine  (COMPAZINE ) 10 MG tablet, Take 1 tablet (10 mg total) by mouth every 6 (six) hours as needed (Nausea or vomiting)., Disp: 60 tablet, Rfl: 2   senna (SENOKOT) 8.6 MG TABS tablet, Take 2 tablets (17.2 mg total) by mouth 2 (two) times daily as needed for mild constipation or moderate constipation., Disp: 120 tablet, Rfl: 3   traZODone  (DESYREL ) 50 MG tablet, TAKE 1  TABLET BY MOUTH AT BEDTIME AS NEEDED FOR SLEEP, Disp: 90 tablet, Rfl: 0        Objective:        BP 116/64 (BP Location: Left Arm, Patient Position: Sitting, Cuff Size: Normal)   Pulse 74   Temp 98.3 F (36.8 C) (Temporal)   Ht 5' 5 (1.651 m)   Wt 155 lb (70.3 kg)   SpO2 98%   BMI 25.79 kg/m   Physical Exam VITALS: BP- 116/64 CARDIOVASCULAR: Normal rhythm  with a murmur. EXTREMITIES: No edema in legs.  Wt Readings from Last 3 Encounters:  10/16/23 155 lb (70.3 kg)  10/14/23 150 lb (68 kg)  10/07/23 155 lb 12.8 oz (70.7 kg)    Physical Exam Vitals reviewed.  Constitutional:      General: He is not in acute distress.    Appearance: Normal appearance. He is normal weight. He is not ill-appearing, toxic-appearing or diaphoretic.  Cardiovascular:     Rate and Rhythm: Normal rate.  Pulmonary:     Effort: Pulmonary effort is normal.  Musculoskeletal:        General: Normal range of motion.  Neurological:     General: No focal deficit present.     Mental Status: He is alert and oriented to person, place, and time. Mental status is at baseline.  Psychiatric:        Mood and Affect: Mood normal.        Behavior: Behavior normal.        Thought Content: Thought content normal.        Judgment: Judgment normal.          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 Recent CT scan indicates significant disease progression and a small bowel mass likely causing GI bleeding. Tumor marker negative, but high tumor mutational burden suggests benefit from immunotherapy. Hospice previously discussed, but he opts for continued treatment. - Continue chemotherapy with cisplatin  and Gemzar , and immunotherapy with durvalumab  as  directed by hematology  - Monitor for GI bleeding and manage as needed. - Discuss flu shot timing with oncologist, considering recent chemotherapy.  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 Hemoglobin improved to 8.9 after recent blood transfusion. Leukocytosis remains unchanged.  Atrial fibrillation and bioprosthetic aortic valve replacement for aortic stenosis Paroxysmal atrial fibrillation with bioprosthetic aortic valve replacement in October 2023. Last echocardiogram in October 2024 showed normal function with 55-60% ejection fraction. Eliquis  was stopped due to GI bleeding. - Reschedule echocardiogram per cardiology  - Ensure cardiologist is aware of Eliquis  discontinuation.  Hypertension Hypertension managed with metoprolol . Blood pressure well-controlled at 116/64.  Hyperlipidemia Hyperlipidemia managed with atorvastatin .  Dependent lower extremity edema Dependent lower extremity edema likely due to prolonged sitting and overall health status. No significant swelling noted during examination. - Encourage leg elevation and use of compression stockings as needed.  Poor appetite and weight loss secondary to malignancy Poor appetite and weight loss secondary to malignancy. - Prescribed dexamethasone  to help with  appetite.  Chronic back pain Chronic back pain present for many years, managed as needed.  Recording duration: 21 minutes      Return in about 1 year (around 10/15/2024) for f/u CPE.     Ginger Patrick, MSN, APRN, FNP-C [place] [facility]

## 2023-10-20 ENCOUNTER — Ambulatory Visit: Admitting: Oncology

## 2023-10-20 ENCOUNTER — Other Ambulatory Visit

## 2023-10-20 ENCOUNTER — Other Ambulatory Visit: Payer: Self-pay | Admitting: *Deleted

## 2023-10-20 DIAGNOSIS — C221 Intrahepatic bile duct carcinoma: Secondary | ICD-10-CM

## 2023-10-21 ENCOUNTER — Ambulatory Visit

## 2023-10-21 ENCOUNTER — Inpatient Hospital Stay (HOSPITAL_BASED_OUTPATIENT_CLINIC_OR_DEPARTMENT_OTHER): Admitting: Oncology

## 2023-10-21 ENCOUNTER — Encounter: Payer: Self-pay | Admitting: Oncology

## 2023-10-21 ENCOUNTER — Inpatient Hospital Stay

## 2023-10-21 VITALS — BP 123/60 | HR 72 | Temp 96.0°F | Resp 17 | Wt 150.0 lb

## 2023-10-21 DIAGNOSIS — C786 Secondary malignant neoplasm of retroperitoneum and peritoneum: Secondary | ICD-10-CM

## 2023-10-21 DIAGNOSIS — C221 Intrahepatic bile duct carcinoma: Secondary | ICD-10-CM

## 2023-10-21 DIAGNOSIS — Z5112 Encounter for antineoplastic immunotherapy: Secondary | ICD-10-CM | POA: Diagnosis not present

## 2023-10-21 LAB — CBC WITH DIFFERENTIAL (CANCER CENTER ONLY)
Abs Immature Granulocytes: 0.4 K/uL — ABNORMAL HIGH (ref 0.00–0.07)
Basophils Absolute: 0.1 K/uL (ref 0.0–0.1)
Basophils Relative: 0 %
Eosinophils Absolute: 0 K/uL (ref 0.0–0.5)
Eosinophils Relative: 0 %
HCT: 26.2 % — ABNORMAL LOW (ref 39.0–52.0)
Hemoglobin: 8.5 g/dL — ABNORMAL LOW (ref 13.0–17.0)
Immature Granulocytes: 2 %
Lymphocytes Relative: 3 %
Lymphs Abs: 0.8 K/uL (ref 0.7–4.0)
MCH: 28.5 pg (ref 26.0–34.0)
MCHC: 32.4 g/dL (ref 30.0–36.0)
MCV: 87.9 fL (ref 80.0–100.0)
Monocytes Absolute: 1.6 K/uL — ABNORMAL HIGH (ref 0.1–1.0)
Monocytes Relative: 7 %
Neutro Abs: 20.8 K/uL — ABNORMAL HIGH (ref 1.7–7.7)
Neutrophils Relative %: 88 %
Platelet Count: 58 K/uL — ABNORMAL LOW (ref 150–400)
RBC: 2.98 MIL/uL — ABNORMAL LOW (ref 4.22–5.81)
RDW: 15 % (ref 11.5–15.5)
WBC Count: 23.6 K/uL — ABNORMAL HIGH (ref 4.0–10.5)
nRBC: 0 % (ref 0.0–0.2)

## 2023-10-21 LAB — CMP (CANCER CENTER ONLY)
ALT: 46 U/L — ABNORMAL HIGH (ref 0–44)
AST: 42 U/L — ABNORMAL HIGH (ref 15–41)
Albumin: 2 g/dL — ABNORMAL LOW (ref 3.5–5.0)
Alkaline Phosphatase: 321 U/L — ABNORMAL HIGH (ref 38–126)
Anion gap: 8 (ref 5–15)
BUN: 28 mg/dL — ABNORMAL HIGH (ref 8–23)
CO2: 24 mmol/L (ref 22–32)
Calcium: 8.3 mg/dL — ABNORMAL LOW (ref 8.9–10.3)
Chloride: 102 mmol/L (ref 98–111)
Creatinine: 0.9 mg/dL (ref 0.61–1.24)
GFR, Estimated: >60 mL/min (ref >60)
Glucose, Bld: 98 mg/dL (ref 70–99)
Potassium: 4 mmol/L (ref 3.5–5.1)
Sodium: 134 mmol/L — ABNORMAL LOW (ref 135–145)
Total Bilirubin: 1 mg/dL (ref 0.0–1.2)
Total Protein: 5 g/dL — ABNORMAL LOW (ref 6.5–8.1)

## 2023-10-21 LAB — SAMPLE TO BLOOD BANK

## 2023-10-21 LAB — MAGNESIUM: Magnesium: 1.8 mg/dL (ref 1.7–2.4)

## 2023-10-21 MED ORDER — SODIUM CHLORIDE 0.9 % IV SOLN
Freq: Once | INTRAVENOUS | Status: AC
  Filled 2023-10-21: qty 250

## 2023-10-21 NOTE — Progress Notes (Signed)
 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

## 2023-10-21 NOTE — Progress Notes (Signed)
 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    INTERVAL HISTORY: Patient returns to clinic today for repeat laboratory, further evaluation, consideration of cycle 3, day 8 of split dose cisplatin , gemcitabine , and durvalumab .  He continues to have chronic weakness and fatigue.  His appetite has slightly improved with dexamethasone .  He has no neurologic complaints.  He denies any recent fevers or illnesses.  He has no chest pain, shortness of breath, cough, or hemoptysis.  He does not complain of abdominal pain today.  He denies any nausea, vomiting, constipation, or diarrhea.  He has no melena or hematochezia today.  He has no urinary complaints.  Patient offers no further specific complaints today.    REVIEW OF SYSTEMS:   Review of Systems  Constitutional:  Positive for malaise/fatigue. Negative for fever and weight loss.  Respiratory: Negative.  Negative for cough, hemoptysis and shortness of breath.   Cardiovascular: Negative.  Negative for chest pain and leg swelling.  Gastrointestinal: Negative.  Negative for abdominal pain, blood in stool, constipation, diarrhea, melena, nausea and vomiting.  Genitourinary: Negative.  Negative for dysuria.  Musculoskeletal: Negative.  Negative for back pain.  Skin: Negative.  Negative for rash.  Neurological:  Positive for weakness. Negative for dizziness, focal weakness and headaches.  Psychiatric/Behavioral:  Negative.  The patient is not nervous/anxious.     As per HPI. Otherwise, a complete review of systems is negative.  PAST MEDICAL HISTORY: Past Medical History:  Diagnosis Date   Angina pectoris    Aortic atherosclerosis    Aortic stenosis    a.) TTE 03/14/2018: mod AS (MPG 14); b.) TTE 09/13/2019: mild AS (MPG 12.5); c.) TTE 08/16/2021: mod AS (MPG 18.5); d.) R/LHC 09/17/2021: mRA 3, mPA 19, mPCWP 11, LVEDP 15, CO 5.22, CI 2.75; e.) s/p TAVR 11/06/2021   CAD (coronary artery disease) 09/17/2021   a.) R/LHC 09/17/2021: 30% pRCA, 40% dRCA, 50% pLCx, 30% dLCx, 50% pLAD, 50% mLAD, 50% dLAD --> med mgmt   Cardiac murmur    Cervical spondylosis    Cholelithiasis    CRAO (central retinal artery occlusion), 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   CVA (cerebral vascular accident) (HCC) 03/13/2018   a.) MRI brain 03/14/2018 --> acute infarct in the posterior RIGHT frontal lobe; small chronic cerebellar infarct --> Tx'd with TPA with (+) improvement/resolution of symptoms   Diastolic dysfunction    a.) TTE 01/31/2015: EF 55-60%, MAC, G1DD; b.) TTE 03/14/2018: EF 60-65%, mild LAE, sev MAC, sev AoV cal with mod AS (MPG 14) G2DD; c.) TTE 09/13/2019: EF 55-60%, mild LAE, mild-mod MR, mild AS (MPG 12.5), G2DD; d.) TTE 08/16/2021: EF 60-65%, mild LVH, mod MAC, mild MR, mod AS (MPG 18.5), G1DD; e.) TTE 12/05/2021: EF 60-65%, mod MAC, s/p TAVR (well seated/functioning valve), mild MR, G1DD   GERD (gastroesophageal reflux disease)    History of chicken pox    History of measles    Hyperlipidemia    Hypertension    LBBB (left bundle branch block)    Long term current use of anticoagulant  a.) apixaban    Low back pain radiating to left lower extremity    PAF (paroxysmal atrial fibrillation) (HCC)    a.) CHA2DS2VASc = 6 (age x2, HTN, CVA x2, vascular disease history);  b.) rate/rhythm maintained on oral metoprolol  succinate; chronically anticoagulated with apixaban    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   a.) 26mm S3UR via RIGHT TF approach   Skin cancer     PAST SURGICAL HISTORY: Past Surgical History:  Procedure Laterality Date   COLONOSCOPY  09/26/2023   Procedure: COLONOSCOPY;  Surgeon: Onita Elspeth Sharper, DO;  Location: [facility] ENDOSCOPY;  Service: Gastroenterology;;   ENTEROSCOPY N/A 09/28/2023   Procedure: ENTEROSCOPY;  Surgeon: Unk Corinn Skiff, MD;  Location: [facility] ENDOSCOPY;  Service: Gastroenterology;  Laterality: N/A;   ESOPHAGOGASTRODUODENOSCOPY N/A 09/26/2023   Procedure: EGD (ESOPHAGOGASTRODUODENOSCOPY);  Surgeon: Onita Elspeth Sharper, DO;  Location: [facility] ENDOSCOPY;  Service: Gastroenterology;  Laterality: N/A;   GIVENS CAPSULE STUDY  09/26/2023   Procedure: IMAGING PROCEDURE, GI TRACT, INTRALUMINAL, VIA CAPSULE;  Surgeon: Onita Elspeth Sharper, DO;  Location: [facility] ENDOSCOPY;  Service: Gastroenterology;;   INTRAOPERATIVE TRANSTHORACIC ECHOCARDIOGRAM N/A 11/06/2021   Procedure: INTRAOPERATIVE TRANSTHORACIC ECHOCARDIOGRAM;  Surgeon: Verlin Lonni BIRCH, MD;  Location[facility];  Service: Open Heart Surgery;  Laterality: N/A;   IR IMAGING GUIDED PORT INSERTION  08/07/2023   RIGHT/LEFT HEART CATH AND CORONARY ANGIOGRAPHY N/A 09/17/2021   Procedure: RIGHT/LEFT HEART CATH AND CORONARY ANGIOGRAPHY;  Surgeon: Verlin Lonni BIRCH, MD;  Location: [facility] INVASIVE CV LAB;  Service: Cardiovascular;  Laterality: N/A;   ROBOTIC ASSISTED LAPAROSCOPIC CHOLECYSTECTOMY  07/24/2022   SKIN CANCER EXCISION  07/2020   TRANSCATHETER AORTIC VALVE REPLACEMENT, TRANSFEMORAL N/A 11/06/2021   Procedure: Transcatheter Aortic Valve Replacement, Transfemoral;  Surgeon: Verlin Lonni BIRCH, MD;  Location: [facility] OR;  Service: Open Heart Surgery;  Laterality: N/A;    FAMILY HISTORY: Family History  Problem Relation Age of Onset   Arrhythmia Mother        A-Fib   Lung cancer Father    Diabetes Son    Colon cancer Neg Hx    Prostate cancer Neg Hx     ADVANCED DIRECTIVES (Y/N):   N  HEALTH MAINTENANCE: Social History   Tobacco Use   Smoking status: Former    Current packs/day: 0.00    Average packs/day: 1 pack/day for 20.0 years (20.0 ttl pk-yrs)    Types: Cigarettes    Start date: 03/12/1963    Quit date: 03/12/1983    Years since quitting: 40.6    Passive exposure: Past   Smokeless tobacco: Never  Vaping Use   Vaping status: Never Used  Substance Use Topics   Alcohol use: No   Drug use: No     Colonoscopy:  PAP:  Bone density:  Lipid panel:  Allergies  Allergen Reactions   Latex Rash    Current Outpatient Medications  Medication Sig Dispense Refill   atorvastatin  (LIPITOR) 80 MG tablet Take 1 tablet (80 mg total) by mouth daily. 90 tablet 3   dexamethasone  (DECADRON ) 4 MG tablet Take 1 tablet (4 mg total) by mouth daily. 30 tablet 3   lidocaine -prilocaine  (EMLA ) cream Apply to affected area once 30 g 3   metoprolol  succinate (TOPROL -XL) 25 MG 24 hr tablet TAKE ONE AND ONE-HALF TABLETS DAILY 45 tablet 3   ondansetron  (ZOFRAN ) 8 MG tablet Take 1 tablet (8 mg total) by mouth every 8 (eight) hours as needed for nausea or vomiting. Start on the third day after cisplatin . 60  tablet 2   oxyCODONE  (ROXICODONE ) 5 MG immediate release tablet Take 1 tablet (5 mg total) by mouth every 6 (six) hours as needed. 90 tablet 0   polyethylene glycol powder (GLYCOLAX /MIRALAX ) 17 GM/SCOOP powder Take 17 g by mouth as directed. 510 g 3   prochlorperazine  (COMPAZINE ) 10 MG tablet Take 1 tablet (10 mg total) by mouth every 6 (six) hours as needed (Nausea or vomiting). 60 tablet 2   senna (SENOKOT) 8.6 MG TABS tablet Take 2 tablets (17.2 mg total) by mouth 2 (two) times daily as needed for mild constipation or moderate constipation. 120 tablet 3   traZODone  (DESYREL ) 50 MG tablet TAKE 1 TABLET BY MOUTH AT BEDTIME AS NEEDED FOR SLEEP 90 tablet 0   No current facility-administered medications for this visit.    OBJECTIVE: Vitals:   10/21/23 0832  BP: 123/60  Pulse:  72  Resp: 17  Temp: (!) 96 F (35.6 C)  SpO2: 100%     Body mass index is 24.96 kg/m.    ECOG FS:1 - Symptomatic but completely ambulatory  General: Well-developed, well-nourished, no acute distress. Eyes: Pink conjunctiva, anicteric sclera. HEENT: Normocephalic, moist mucous membranes. Lungs: No audible wheezing or coughing. Heart: Regular rate and rhythm. Abdomen: Soft, nontender, no obvious distention. Musculoskeletal: No edema, cyanosis, or clubbing. Neuro: Alert, answering all questions appropriately. Cranial nerves grossly intact. Skin: No rashes or petechiae noted. Psych: Normal affect.  LAB RESULTS:  Lab Results  Component Value Date   NA 134 (L) 10/21/2023   K 4.0 10/21/2023   CL 102 10/21/2023   CO2 24 10/21/2023   GLUCOSE 98 10/21/2023   BUN 28 (H) 10/21/2023   CREATININE 0.90 10/21/2023   CALCIUM  8.3 (L) 10/21/2023   PROT 5.0 (L) 10/21/2023   ALBUMIN 2.0 (L) 10/21/2023   AST 42 (H) 10/21/2023   ALT 46 (H) 10/21/2023   ALKPHOS 321 (H) 10/21/2023   BILITOT 1.0 10/21/2023   GFRNONAA >60 10/21/2023   GFRAA 81 08/25/2019    Lab Results  Component Value Date   WBC 23.6 (H) 10/21/2023   NEUTROABS PENDING 10/21/2023   HGB 8.5 (L) 10/21/2023   HCT 26.2 (L) 10/21/2023   MCV 87.9 10/21/2023   PLT 58 (L) 10/21/2023     STUDIES: CT ENTERO ABD/PELVIS W CONTAST Result Date: 09/29/2023 EXAM: CT ABDOMEN AND PELVIS WITH CONTRAST (ENTEROGRAPHY) 09/29/2023 11:04:29 AM TECHNIQUE: CT of the abdomen and pelvis was performed after the administration of intravenous contrast and negative oral contrast. Automated exposure control, iterative reconstruction, and/or weight based adjustment of the mA/kV was utilized to reduce the radiation dose to as low as reasonably achievable. COMPARISON: 07/10/2023 CT abdomen/pelvis, 09/28/2023 abdominal radiograph, and 07/15/2023 PET/CT. CLINICAL HISTORY: GI bleed. 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 FINDINGS: LOWER  CHEST: Numerous (greater than 10) solid pulmonary nodules scattered throughout both lung bases, new since 07/10/2023 CT. Largest 0.9 cm in the right lower lobe on series 4 image 9 and 1.0 cm in the medial left upper lobe on image 2. LIVER: Innumerable (greater than 20) hypodense liver masses scattered throughout the liver, increased in size and number. Representative 3.4 x 3.3 cm posterior right liver mass on series 2 image 24, increased from 1.6 x 1.5 cm. Representative 2.3 x 1.9 cm anterior left liver mass on image 22, increased from 1.0 x 0.9 cm. GALLBLADDER AND BILE DUCTS: Cholecystectomy. No biliary ductal dilatation. SPLEEN: No acute abnormality. PANCREAS: No acute abnormality. ADRENAL GLANDS: No acute abnormality. KIDNEYS, URETERS AND BLADDER:  No stones in the kidneys or ureters. No hydronephrosis. No perinephric or periureteral stranding. Urinary bladder is unremarkable. STOMACH AND BOWEL: New irregular 3.9 x 2.6 cm small bowel mass within a right abdominal small bowel loop on series 2 image 69. No dilated small bowel loops. Diminutive appendix. No large bowel wall thickening or diverticulosis. Metallic density 2.0 x 1.4 cm structure within the mid rectal lumen on series 5 image 84. No active intraluminal contrast extravasation. PERITONEUM AND RETROPERITONEUM: Omental soft tissue caking is substantially decreased with dominant residual 1.7 x 1.3 cm right omental nodule on series 2 image 54. New mesenteric soft tissue implants, largest 1.8 x 1.6 cm on image 67. Upper right pelvic sidewall 2.5 x 2.3 cm implant on image 74, increased from 1.1 x 0.9 cm. VASCULATURE: Atherosclerotic nonaneurysmal abdominal aorta. Superior approach central venous catheter tip seen at the cavoatrial junction. LYMPH NODES: Newly mildly enlarged 1.1 cm porta hepatis lymph node noted on series 2 image 33. REPRODUCTIVE ORGANS: Stable mild prostatomegaly. BONES AND SOFT TISSUES: Moderate thoracolumbar spondylosis. IMPRESSION: 1. New  irregular 3.9 x 2.6 cm small bowel mass within a right abdominal small bowel loop compatible with small bowel metastasis, likely the source of GI bleeding. No active GI bleed on CT. 2. Interval progression of advanced liver metastatic disease. 3. Innumerable new lung metastases at the lung bases. 4. Mixed changes in the peritoneal metastatic disease, with decreased omental caking, but new/increased metastatic implants in the mesentery and right pelvic sidewall. 5. New metastatic adenopathy to the porta hepatis. Electronically signed by: Selinda Blue MD 09/29/2023 11:37 AM EDT RP Workstation: [id number]   DG Abd 1 View Result Date: 09/28/2023 CLINICAL DATA:  Nausea. EXAM: ABDOMEN - 1 VIEW COMPARISON:  Radiograph yesterday FINDINGS: Retained endoscopy capsule projects over the right pelvis, unchanged. No small bowel distension or evidence of obstruction. Mild gaseous distension of transverse colon. No significant formed colonic stool. TAVR partially included. Vascular calcifications are seen. IMPRESSION: 1. Retained endoscopy capsule projects over the right pelvis, unchanged. 2. Mild gaseous distension of transverse colon. No evidence of bowel obstruction. Electronically Signed   By: Andrea Gasman M.D.   On: 09/28/2023 23:45   DG Abd 1 View Result Date: 09/27/2023 EXAM: 1 VIEW XRAY OF THE ABDOMEN 09/27/2023 02:01:36 PM COMPARISON: None available. CLINICAL HISTORY: Pt had video capsule study yesterday, capsule did not reach cecum on images, rule out any retained capsule. FINDINGS: BOWEL: Nonobstructive bowel gas pattern. SOFT TISSUES: No opaque urinary calculi. Endoscopy capsule in right lower quadrant. TAVR and right chest power port noted. BONES: No acute osseous abnormality. Probable phleboliths in the pelvis. IMPRESSION: 1. Endoscopy capsule in the right lower quadrant, consistent with retained capsule. 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  Luminal evaluation during his admission did not reveal a source of bleeding, but CT scan on September 29, 2023 revealed significant progression of disease as well as a small bowel mass that is likely the source.  Previous biopsy revealed a poorly differentiated carcinoma consistent with either upper GI or pancreatobiliary origin.  Tumor markers are negative.  NGS testing suggested tissue of origin is possibly cholangiocarcinoma (82%) as well as a high tumor mutational burden indicating that immunotherapy would be of benefit.  Patient is receiving  split dose cisplatin , gemcitabine , and durvalumab  on day 1 with cisplatin  and gemcitabine  only on day 8 followed by G-CSF support.  This is a 21-day cycle.  Patient has had port placed.  Gemcitabine  is also dose reduced.  Hospice was previously discussed, but patient wishes to attempt additional treatment. We have discussed the possibility of using single agent durvalumab  if his cytopenias continue to be an issue.  Delay cycle 3, day 8 of treatment secondary to thrombocytopenia.  Patient will receive 1 L of IV fluids instead.  Return to clinic in 1 week for further evaluation and reconsideration of cycle 3, day 8.  Plan to dose reduce gemcitabine  further.   Poor appetite/weight loss: Improved with daily dexamethasone .   Abdominal pain: Patient does not complain of this today.  Continue oxycodone  as prescribed. Anemia: Hemoglobin has trended down slightly to 8.5.  Monitor.  Luminal evaluation and imaging results as above.  Eliquis  has been discontinued.   Leukocytosis: Likely secondary to steroid use.  Monitor.   Thrombocytopenia: Delay treatment as above.  Dose reduce gemcitabine  further.   Hyponatremia: Mild, monitor. Hypokalemia:  Resolved. Hyperbilirubinemia: Resolved.    Patient expressed understanding and was in agreement with this plan. He also understands that He can call clinic at any time with any questions, concerns, or complaints.    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

## 2023-10-23 ENCOUNTER — Telehealth: Payer: Self-pay | Admitting: *Deleted

## 2023-10-23 ENCOUNTER — Encounter: Payer: Self-pay | Admitting: Oncology

## 2023-10-23 ENCOUNTER — Other Ambulatory Visit: Payer: Self-pay | Admitting: *Deleted

## 2023-10-23 ENCOUNTER — Ambulatory Visit

## 2023-10-23 ENCOUNTER — Inpatient Hospital Stay

## 2023-10-23 DIAGNOSIS — C221 Intrahepatic bile duct carcinoma: Secondary | ICD-10-CM

## 2023-10-23 DIAGNOSIS — R609 Edema, unspecified: Secondary | ICD-10-CM

## 2023-10-23 NOTE — Telephone Encounter (Signed)
 I got in touch with the wife and she said that they can come at 830 and she will have the cream on for him to get the labs.

## 2023-10-23 NOTE — Telephone Encounter (Signed)
 Daughter had called in and said for the last 2 days he has been very weak and he has had  pitting edema in the lower extremities bilateral down to his feet.  She wondered if he needed to have somebody look at him having him come in.

## 2023-10-24 ENCOUNTER — Encounter: Payer: Self-pay | Admitting: Oncology

## 2023-10-24 ENCOUNTER — Encounter: Admitting: Nurse Practitioner

## 2023-10-24 ENCOUNTER — Ambulatory Visit

## 2023-10-24 ENCOUNTER — Encounter: Payer: Self-pay | Admitting: Nurse Practitioner

## 2023-10-24 ENCOUNTER — Other Ambulatory Visit: Payer: Self-pay

## 2023-10-24 ENCOUNTER — Inpatient Hospital Stay

## 2023-10-24 ENCOUNTER — Inpatient Hospital Stay (HOSPITAL_BASED_OUTPATIENT_CLINIC_OR_DEPARTMENT_OTHER): Admitting: Nurse Practitioner

## 2023-10-24 ENCOUNTER — Other Ambulatory Visit: Payer: Self-pay | Admitting: *Deleted

## 2023-10-24 VITALS — BP 127/77 | HR 56 | Temp 97.9°F | Resp 18 | Ht 65.0 in | Wt 149.4 lb

## 2023-10-24 VITALS — BP 126/68 | HR 95 | Temp 95.0°F | Resp 19

## 2023-10-24 DIAGNOSIS — C221 Intrahepatic bile duct carcinoma: Secondary | ICD-10-CM

## 2023-10-24 DIAGNOSIS — K922 Gastrointestinal hemorrhage, unspecified: Secondary | ICD-10-CM

## 2023-10-24 DIAGNOSIS — R609 Edema, unspecified: Secondary | ICD-10-CM

## 2023-10-24 DIAGNOSIS — D649 Anemia, unspecified: Secondary | ICD-10-CM

## 2023-10-24 DIAGNOSIS — Z7189 Other specified counseling: Secondary | ICD-10-CM | POA: Diagnosis not present

## 2023-10-24 DIAGNOSIS — Z5112 Encounter for antineoplastic immunotherapy: Secondary | ICD-10-CM | POA: Diagnosis not present

## 2023-10-24 LAB — CBC WITH DIFFERENTIAL/PLATELET
Abs Immature Granulocytes: 0.81 K/uL — ABNORMAL HIGH (ref 0.00–0.07)
Basophils Absolute: 0.1 K/uL (ref 0.0–0.1)
Basophils Relative: 0 %
Eosinophils Absolute: 0 K/uL (ref 0.0–0.5)
Eosinophils Relative: 0 %
HCT: 21.9 % — ABNORMAL LOW (ref 39.0–52.0)
Hemoglobin: 7.3 g/dL — ABNORMAL LOW (ref 13.0–17.0)
Immature Granulocytes: 2 %
Lymphocytes Relative: 3 %
Lymphs Abs: 1.2 K/uL (ref 0.7–4.0)
MCH: 29 pg (ref 26.0–34.0)
MCHC: 33.3 g/dL (ref 30.0–36.0)
MCV: 86.9 fL (ref 80.0–100.0)
Monocytes Absolute: 1.7 K/uL — ABNORMAL HIGH (ref 0.1–1.0)
Monocytes Relative: 5 %
Neutro Abs: 31.7 K/uL — ABNORMAL HIGH (ref 1.7–7.7)
Neutrophils Relative %: 90 %
Platelets: 73 K/uL — ABNORMAL LOW (ref 150–400)
RBC: 2.52 MIL/uL — ABNORMAL LOW (ref 4.22–5.81)
RDW: 15.5 % (ref 11.5–15.5)
WBC: 35.5 K/uL — ABNORMAL HIGH (ref 4.0–10.5)
nRBC: 0 % (ref 0.0–0.2)

## 2023-10-24 LAB — COMPREHENSIVE METABOLIC PANEL WITH GFR
ALT: 39 U/L (ref 0–44)
AST: 46 U/L — ABNORMAL HIGH (ref 15–41)
Albumin: 2 g/dL — ABNORMAL LOW (ref 3.5–5.0)
Alkaline Phosphatase: 366 U/L — ABNORMAL HIGH (ref 38–126)
Anion gap: 8 (ref 5–15)
BUN: 31 mg/dL — ABNORMAL HIGH (ref 8–23)
CO2: 23 mmol/L (ref 22–32)
Calcium: 8.3 mg/dL — ABNORMAL LOW (ref 8.9–10.3)
Chloride: 104 mmol/L (ref 98–111)
Creatinine, Ser: 1.01 mg/dL (ref 0.61–1.24)
GFR, Estimated: >60 mL/min (ref >60)
Glucose, Bld: 95 mg/dL (ref 70–99)
Potassium: 3.8 mmol/L (ref 3.5–5.1)
Sodium: 135 mmol/L (ref 135–145)
Total Bilirubin: 0.9 mg/dL (ref 0.0–1.2)
Total Protein: 5.1 g/dL — ABNORMAL LOW (ref 6.5–8.1)

## 2023-10-24 LAB — PREPARE RBC (CROSSMATCH)

## 2023-10-24 MED ORDER — IRON SUCROSE 20 MG/ML IV SOLN
200.0000 mg | Freq: Once | INTRAVENOUS | Status: AC
  Administered 2023-10-24: 200 mg via INTRAVENOUS
  Filled 2023-10-24: qty 10

## 2023-10-24 NOTE — Progress Notes (Signed)
 Symptom Management Clinic  [facility] A Department of the [place]. [facility] [address] [place], [place] [address] [phone] (phone) [phone] (fax)  Patient Care Team: Corwin Antu, FNP as PCP - General (Family Medicine) Perla, Evalene PARAS, MD as PCP - Cardiology (Cardiology) Portia Fireman, OD as Consulting Physician (Optometry) Perla, Evalene PARAS, MD as Consulting Physician (Cardiology) Maurie Rayfield BIRCH, RN as Oncology Nurse Navigator Jacobo Evalene PARAS, MD as Consulting Physician (Oncology)   Name of the patient: Jon Mejia  [id number]  1940/10/17   Date of visit: 10/24/23  Diagnosis- Metastatic Cholangiocarcinoma  Chief complaint/ Reason for visit- Weakness & peripheral edema  Heme/Onc history:  Oncology History  Peritoneal carcinomatosis (HCC)  07/30/2023 Initial Diagnosis   Peritoneal carcinomatosis (HCC)   08/12/2023 -  Chemotherapy   Patient is on Treatment Plan : BILIARY TRACT Cisplatin  + Gemcitabine  D1,8 + Durvalumab  (1500) D1 q21d / Durvalumab  (1500) q28d     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  He saw Dr. Jacobo on 10/21/2023.  His platelet count was down and he received IV fluids.  Chemotherapy was held.  After ambulating to bathroom he was too weak to pick up legs to get back into bed. His daughter assists him. Has black stools that  come and go.   Review of systems- Review of Systems  Constitutional:  Positive for malaise/fatigue and weight loss. Negative for chills and fever.  HENT:  Negative for hearing loss, nosebleeds, sore throat and tinnitus.   Eyes:  Negative for blurred vision and double vision.  Respiratory:  Negative for cough, hemoptysis, shortness of breath and wheezing.   Cardiovascular:  Negative for chest pain, palpitations and leg swelling.  Gastrointestinal:  Positive for melena. Negative for abdominal pain, blood in stool, constipation, diarrhea, nausea and vomiting.  Genitourinary:  Negative for dysuria, frequency, hematuria and urgency.  Musculoskeletal:  Negative for back pain, falls, joint pain and myalgias.  Skin:  Negative for itching and rash.  Neurological:  Positive for weakness. Negative for dizziness, tingling, sensory change, loss of consciousness and headaches.  Endo/Heme/Allergies:  Negative for environmental allergies. Does not bruise/bleed easily.  Psychiatric/Behavioral:  Negative for depression. The patient is not nervous/anxious and does not have insomnia.      Allergies  Allergen Reactions   Latex Rash    Past Medical History:  Diagnosis Date   Angina pectoris    Aortic atherosclerosis    Aortic stenosis    a.) TTE 03/14/2018: mod AS (MPG 14); b.) TTE 09/13/2019: mild AS (MPG 12.5); c.) TTE 08/16/2021: mod AS (MPG 18.5); d.) R/LHC 09/17/2021: mRA 3, mPA 19, mPCWP 11, LVEDP 15, CO 5.22, CI 2.75; e.) s/p TAVR 11/06/2021   CAD (coronary artery disease) 09/17/2021   a.) R/LHC 09/17/2021: 30% pRCA, 40% dRCA, 50% pLCx, 30% dLCx, 50% pLAD, 50% mLAD, 50% dLAD --> med mgmt   Cardiac murmur    Cervical spondylosis    Cholelithiasis    CRAO (central retinal artery occlusion), 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  CVA (cerebral vascular accident) (HCC) 03/13/2018   a.) MRI brain 03/14/2018 --> acute infarct in the posterior RIGHT frontal lobe; small chronic cerebellar infarct --> Tx'd with TPA with (+)  improvement/resolution of symptoms   Diastolic dysfunction    a.) TTE 01/31/2015: EF 55-60%, MAC, G1DD; b.) TTE 03/14/2018: EF 60-65%, mild LAE, sev MAC, sev AoV cal with mod AS (MPG 14) G2DD; c.) TTE 09/13/2019: EF 55-60%, mild LAE, mild-mod MR, mild AS (MPG 12.5), G2DD; d.) TTE 08/16/2021: EF 60-65%, mild LVH, mod MAC, mild MR, mod AS (MPG 18.5), G1DD; e.) TTE 12/05/2021: EF 60-65%, mod MAC, s/p TAVR (well seated/functioning valve), mild MR, G1DD   GERD (gastroesophageal reflux disease)    History of chicken pox    History of measles    Hyperlipidemia    Hypertension    LBBB (left bundle branch block)    Long term current use of anticoagulant    a.) apixaban    Low back pain radiating to left lower extremity    PAF (paroxysmal atrial fibrillation) (HCC)    a.) CHA2DS2VASc = 6 (age x2, HTN, CVA x2, vascular disease history);  b.) rate/rhythm maintained on oral metoprolol  succinate; chronically anticoagulated with apixaban    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   a.) 26mm S3UR via RIGHT TF approach   Skin cancer    Past Surgical History:  Procedure Laterality Date   COLONOSCOPY  09/26/2023   Procedure: COLONOSCOPY;  Surgeon: Onita Elspeth Sharper, DO;  Location: [facility] ENDOSCOPY;  Service: Gastroenterology;;   ENTEROSCOPY N/A 09/28/2023   Procedure: ENTEROSCOPY;  Surgeon: Unk Corinn Skiff, MD;  Location[facility] ENDOSCOPY;  Service: Gastroenterology;  Laterality: N/A;   ESOPHAGOGASTRODUODENOSCOPY N/A 09/26/2023   Procedure: EGD (ESOPHAGOGASTRODUODENOSCOPY);  Surgeon: Onita Elspeth Sharper, DO;  Location: [facility] ENDOSCOPY;  Service: Gastroenterology;  Laterality: N/A;   GIVENS CAPSULE STUDY  09/26/2023   Procedure: IMAGING PROCEDURE, GI TRACT, INTRALUMINAL, VIA CAPSULE;  Surgeon: Onita Elspeth Sharper, DO;  Location: [facility] ENDOSCOPY;  Service: Gastroenterology;;   INTRAOPERATIVE TRANSTHORACIC ECHOCARDIOGRAM N/A 11/06/2021   Procedure: INTRAOPERATIVE TRANSTHORACIC  ECHOCARDIOGRAM;  Surgeon: Verlin Lonni BIRCH, MD;  Location: [facility] OR;  Service: Open Heart Surgery;  Laterality: N/A;   IR IMAGING GUIDED PORT INSERTION  08/07/2023   RIGHT/LEFT HEART CATH AND CORONARY ANGIOGRAPHY N/A 09/17/2021   Procedure: RIGHT/LEFT HEART CATH AND CORONARY ANGIOGRAPHY;  Surgeon: Verlin Lonni BIRCH, MD;  Location: [facility] INVASIVE CV LAB;  Service: Cardiovascular;  Laterality: N/A;   ROBOTIC ASSISTED LAPAROSCOPIC CHOLECYSTECTOMY  07/24/2022   SKIN CANCER EXCISION  07/2020   TRANSCATHETER AORTIC VALVE REPLACEMENT, TRANSFEMORAL N/A 11/06/2021   Procedure: Transcatheter Aortic Valve Replacement, Transfemoral;  Surgeon: Verlin Lonni BIRCH, MD;  Location: [facility] OR;  Service: Open Heart Surgery;  Laterality: N/A;   Social History   Socioeconomic History   Marital status: Married    Spouse name: [name]   Number of children: 2   Years of education: Not on file   Highest education level: 12th grade  Occupational History   Occupation: Retired   Occupation: Retired [employer]  Tobacco Use   Smoking status: Former    Current packs/day: 0.00    Average packs/day: 1 pack/day for 20.0 years (20.0 ttl pk-yrs)    Types: Cigarettes    Start date: 03/12/1963    Quit date: 03/12/1983    Years since quitting: 40.6    Passive exposure: Past   Smokeless tobacco: Never  Vaping Use   Vaping status: Never Used  Substance and Sexual Activity  Alcohol use: No   Drug use: No   Sexual activity: Not Currently    Partners: Male    Birth control/protection: Surgical  Other Topics Concern   Not on file  Social History Narrative   Not on file   Social Drivers of Health   Financial Resource Strain: Low Risk  (10/14/2023)   Overall Financial Resource Strain (CARDIA)    Difficulty of Paying Living Expenses: Not hard at all  Food Insecurity: No Food Insecurity (10/14/2023)   Hunger Vital Sign    Worried About Running Out of Food in the Last Year: Never true    Ran Out of Food  in the Last Year: Never true  Transportation Needs: No Transportation Needs (10/14/2023)   PRAPARE - Administrator, Civil Service (Medical): No    Lack of Transportation (Non-Medical): No  Physical Activity: Unknown (10/14/2023)   Exercise Vital Sign    Days of Exercise per Week: Patient declined    Minutes of Exercise per Session: Not on file  Stress: Stress Concern Present (10/14/2023)   [employer] of Occupational Health - Occupational Stress Questionnaire    Feeling of Stress: To some extent  Social Connections: Socially Integrated (10/14/2023)   Social Connection and Isolation Panel    Frequency of Communication with Friends and Family: More than three times a week    Frequency of Social Gatherings with Friends and Family: Twice a week    Attends Religious Services: More than 4 times per year    Active Member of [employer] or Organizations: Yes    Attends Banker Meetings: More than 4 times per year    Marital Status: Married  Catering manager Violence: Patient Declined (09/25/2023)   Humiliation, Afraid, Rape, and Kick questionnaire    Fear of Current or Ex-Partner: Patient declined    Emotionally Abused: Patient declined    Physically Abused: Patient declined    Sexually Abused: Patient declined   Family History  Problem Relation Age of Onset   Arrhythmia Mother        A-Fib   Lung cancer Father    Diabetes Son    Colon cancer Neg Hx    Prostate cancer Neg Hx     Current Outpatient Medications:    atorvastatin  (LIPITOR) 80 MG tablet, Take 1 tablet (80 mg total) by mouth daily., Disp: 90 tablet, Rfl: 3   dexamethasone  (DECADRON ) 4 MG tablet, Take 1 tablet (4 mg total) by mouth daily., Disp: 30 tablet, Rfl: 3   lidocaine -prilocaine  (EMLA ) cream, Apply to affected area once, Disp: 30 g, Rfl: 3   metoprolol  succinate (TOPROL -XL) 25 MG 24 hr tablet, TAKE ONE AND ONE-HALF TABLETS DAILY, Disp: 45 tablet, Rfl: 3   ondansetron  (ZOFRAN ) 8 MG tablet, Take 1  tablet (8 mg total) by mouth every 8 (eight) hours as needed for nausea or vomiting. Start on the third day after cisplatin ., Disp: 60 tablet, Rfl: 2   oxyCODONE  (ROXICODONE ) 5 MG immediate release tablet, Take 1 tablet (5 mg total) by mouth every 6 (six) hours as needed., Disp: 90 tablet, Rfl: 0   polyethylene glycol powder (GLYCOLAX /MIRALAX ) 17 GM/SCOOP powder, Take 17 g by mouth as directed., Disp: 510 g, Rfl: 3   prochlorperazine  (COMPAZINE ) 10 MG tablet, Take 1 tablet (10 mg total) by mouth every 6 (six) hours as needed (Nausea or vomiting)., Disp: 60 tablet, Rfl: 2   senna (SENOKOT) 8.6 MG TABS tablet, Take 2 tablets (17.2 mg total) by mouth 2 (two)  times daily as needed for mild constipation or moderate constipation., Disp: 120 tablet, Rfl: 3   traZODone  (DESYREL ) 50 MG tablet, TAKE 1 TABLET BY MOUTH AT BEDTIME AS NEEDED FOR SLEEP, Disp: 90 tablet, Rfl: 0  Physical exam:  Vitals:   10/24/23 0851 10/24/23 0853 10/24/23 0858  BP: (!) 141/65  127/77  Pulse: 67  (!) 56  Resp: 18    Temp: 97.9 F (36.6 C)    TempSrc: Tympanic    Weight: 149 lb 6.4 oz (67.8 kg)    Height:  5' 5 (1.651 m)    Physical Exam Vitals reviewed.  Constitutional:      General: He is not in acute distress.    Appearance: He is well-developed. He is ill-appearing.  HENT:     Head: Normocephalic and atraumatic.     Mouth/Throat:     Pharynx: No oropharyngeal exudate.  Eyes:     General: No scleral icterus. Cardiovascular:     Rate and Rhythm: Normal rate and regular rhythm.     Heart sounds: Normal heart sounds.  Pulmonary:     Effort: Pulmonary effort is normal.     Breath sounds: Normal breath sounds. No wheezing.  Abdominal:     General: There is distension.     Palpations: Abdomen is soft.     Tenderness: There is no abdominal tenderness. There is no guarding.  Skin:    General: Skin is warm and dry.     Coloration: Skin is pale.  Neurological:     Mental Status: He is alert and oriented to  person, place, and time.  Psychiatric:        Mood and Affect: Mood normal.        Behavior: Behavior normal.        Latest Ref Rng & Units 10/24/2023    8:38 AM  CMP  Glucose 70 - 99 mg/dL 95   BUN 8 - 23 mg/dL 31   Creatinine 9.38 - 1.24 mg/dL 8.98   Sodium 864 - 854 mmol/L 135   Potassium 3.5 - 5.1 mmol/L 3.8   Chloride 98 - 111 mmol/L 104   CO2 22 - 32 mmol/L 23   Calcium  8.9 - 10.3 mg/dL 8.3   Total Protein 6.5 - 8.1 g/dL 5.1   Total Bilirubin 0.0 - 1.2 mg/dL 0.9   Alkaline Phos 38 - 126 U/L 366   AST 15 - 41 U/L 46   ALT 0 - 44 U/L 39       Latest Ref Rng & Units 10/24/2023    8:38 AM  CBC  WBC 4.0 - 10.5 K/uL 35.5   Hemoglobin 13.0 - 17.0 g/dL 7.3   Hematocrit 60.9 - 52.0 % 21.9   Platelets 150 - 400 K/uL 73    No images are attached to the encounter.  CT ENTERO ABD/PELVIS W CONTAST Result Date: 09/29/2023 EXAM: CT ABDOMEN AND PELVIS WITH CONTRAST (ENTEROGRAPHY) 09/29/2023 11:04:29 AM TECHNIQUE: CT of the abdomen and pelvis was performed after the administration of intravenous contrast and negative oral contrast. Automated exposure control, iterative reconstruction, and/or weight based adjustment of the mA/kV was utilized to reduce the radiation dose to as low as reasonably achievable. COMPARISON: 07/10/2023 CT abdomen/pelvis, 09/28/2023 abdominal radiograph, and 07/15/2023 PET/CT. CLINICAL HISTORY: GI bleed. 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 FINDINGS: LOWER CHEST: Numerous (greater than 10) solid pulmonary nodules scattered throughout both lung bases, new since 07/10/2023 CT. Largest 0.9 cm in the right lower lobe on  series 4 image 9 and 1.0 cm in the medial left upper lobe on image 2. LIVER: Innumerable (greater than 20) hypodense liver masses scattered throughout the liver, increased in size and number. Representative 3.4 x 3.3 cm posterior right liver mass on series 2 image 24, increased from 1.6 x 1.5 cm. Representative 2.3 x 1.9 cm anterior  left liver mass on image 22, increased from 1.0 x 0.9 cm. GALLBLADDER AND BILE DUCTS: Cholecystectomy. No biliary ductal dilatation. SPLEEN: No acute abnormality. PANCREAS: No acute abnormality. ADRENAL GLANDS: No acute abnormality. KIDNEYS, URETERS AND BLADDER: No stones in the kidneys or ureters. No hydronephrosis. No perinephric or periureteral stranding. Urinary bladder is unremarkable. STOMACH AND BOWEL: New irregular 3.9 x 2.6 cm small bowel mass within a right abdominal small bowel loop on series 2 image 69. No dilated small bowel loops. Diminutive appendix. No large bowel wall thickening or diverticulosis. Metallic density 2.0 x 1.4 cm structure within the mid rectal lumen on series 5 image 84. No active intraluminal contrast extravasation. PERITONEUM AND RETROPERITONEUM: Omental soft tissue caking is substantially decreased with dominant residual 1.7 x 1.3 cm right omental nodule on series 2 image 54. New mesenteric soft tissue implants, largest 1.8 x 1.6 cm on image 67. Upper right pelvic sidewall 2.5 x 2.3 cm implant on image 74, increased from 1.1 x 0.9 cm. VASCULATURE: Atherosclerotic nonaneurysmal abdominal aorta. Superior approach central venous catheter tip seen at the cavoatrial junction. LYMPH NODES: Newly mildly enlarged 1.1 cm porta hepatis lymph node noted on series 2 image 33. REPRODUCTIVE ORGANS: Stable mild prostatomegaly. BONES AND SOFT TISSUES: Moderate thoracolumbar spondylosis. IMPRESSION: 1. New irregular 3.9 x 2.6 cm small bowel mass within a right abdominal small bowel loop compatible with small bowel metastasis, likely the source of GI bleeding. No active GI bleed on CT. 2. Interval progression of advanced liver metastatic disease. 3. Innumerable new lung metastases at the lung bases. 4. Mixed changes in the peritoneal metastatic disease, with decreased omental caking, but new/increased metastatic implants in the mesentery and right pelvic sidewall. 5. New metastatic adenopathy to the  porta hepatis. Electronically signed by: Selinda Blue MD 09/29/2023 11:37 AM EDT RP Workstation: [id number]   DG Abd 1 View Result Date: 09/28/2023 CLINICAL DATA:  Nausea. EXAM: ABDOMEN - 1 VIEW COMPARISON:  Radiograph yesterday FINDINGS: Retained endoscopy capsule projects over the right pelvis, unchanged. No small bowel distension or evidence of obstruction. Mild gaseous distension of transverse colon. No significant formed colonic stool. TAVR partially included. Vascular calcifications are seen. IMPRESSION: 1. Retained endoscopy capsule projects over the right pelvis, unchanged. 2. Mild gaseous distension of transverse colon. No evidence of bowel obstruction. Electronically Signed   By: Andrea Gasman M.D.   On: 09/28/2023 23:45   DG Abd 1 View Result Date: 09/27/2023 EXAM: 1 VIEW XRAY OF THE ABDOMEN 09/27/2023 02:01:36 PM COMPARISON: None available. CLINICAL HISTORY: Pt had video capsule study yesterday, capsule did not reach cecum on images, rule out any retained capsule. FINDINGS: BOWEL: Nonobstructive bowel gas pattern. SOFT TISSUES: No opaque urinary calculi. Endoscopy capsule in right lower quadrant. TAVR and right chest power port noted. BONES: No acute osseous abnormality. Probable phleboliths in the pelvis. IMPRESSION: 1. Endoscopy capsule in the right lower quadrant, consistent with retained capsule. Electronically signed by: Rockey Kilts MD 09/27/2023 02:53 PM EDT RP Workstation: [id number]    Assessment and plan- Patient is a 83 y.o. male who presents to Symptom Management Clinic for complaints of:   Stage IV Poorly Differentiated Carcinoma-  possibly biliary tract, currently s/p C3D1 cisplatin -gemcitabine -durvalumab  with Dr Jacobo on 10/14/23. C3D8 delayed d/t thrombocytopenia. CT Entero abd/pelvis on 9/1 showed Interval progression of advanced liver metastatic disease. Innumerable new lung metastases at the lung bases. Mixed changes in the peritoneal metastatic disease, with decreased  omental caking, but new/increased metastatic implants in the mesentery and right pelvic sidewall. New metastatic adenopathy to the porta hepatis. Follow up with Dr Jacobo for discussion of continuing chemotherapy vs support care vs changing treatment.  Symptomatic Anemia- Hmg 7.3. Worse despite holding chemo. Melena. 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 Suspect he is having bleeding of these tumor sites causing his anemia. Plan to start venofer  today. Reviewed risks including possible anaphylaxis. Plan for 1 unit pRBCs on Monday and possible iron  again on Wednesday when he sees Dr Jacobo. Discussed supportive care that does not treat underlying cause. Continue to hold anticoagulation.  Edema: secondary to frailty, anemia. Hold off on diuretics.  GI Bleed- related to tumor. S/p hospitalization in late October. 8/29- Status post EGD and colonoscopy by Dr. Onita on 8/29-it showed old blood and clot in the colon and in the terminal ileum.  Video capsule endoscopy not showing clear site of bleed and is stuck in cecum area seen on KUB. Push enteroscopy neg. CT Entero Abd/Pelvis on 9/1 showed New irregular 3.9 x 2.6 cm small bowel mass within a right abdominal small bowel loop compatible with small bowel metastasis, likely the source of GI bleeding. No active GI bleed on CT.  Goals of care- poor prognosis. Follow up with Dr Jacobo next week as scheduled. Patient is leaning towards supportive based care. Discussed with wife, patient, and daughter today.    Visit Diagnosis 1. Gastrointestinal hemorrhage, unspecified gastrointestinal hemorrhage type   2. Symptomatic anemia   3. Goals of care, counseling/discussion   4. 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 [facility])    Patient expressed understanding and was in agreement with this plan. He also understands that He can call clinic at any time with any questions, concerns, or complaints.   Thank you for allowing me to participate in the care of  this very pleasant patient.   Tinnie Dawn, DNP, AGNP-C, AOCNP [facility] [phone]  CC: Dr Jacobo

## 2023-10-27 ENCOUNTER — Other Ambulatory Visit: Payer: Self-pay | Admitting: *Deleted

## 2023-10-27 ENCOUNTER — Inpatient Hospital Stay

## 2023-10-27 ENCOUNTER — Other Ambulatory Visit

## 2023-10-27 ENCOUNTER — Inpatient Hospital Stay (HOSPITAL_BASED_OUTPATIENT_CLINIC_OR_DEPARTMENT_OTHER): Admitting: Hospice and Palliative Medicine

## 2023-10-27 ENCOUNTER — Inpatient Hospital Stay: Admitting: Oncology

## 2023-10-27 ENCOUNTER — Ambulatory Visit: Admitting: Oncology

## 2023-10-27 DIAGNOSIS — C801 Malignant (primary) neoplasm, unspecified: Secondary | ICD-10-CM

## 2023-10-27 DIAGNOSIS — C221 Intrahepatic bile duct carcinoma: Secondary | ICD-10-CM

## 2023-10-27 DIAGNOSIS — D649 Anemia, unspecified: Secondary | ICD-10-CM

## 2023-10-27 DIAGNOSIS — C787 Secondary malignant neoplasm of liver and intrahepatic bile duct: Secondary | ICD-10-CM | POA: Diagnosis not present

## 2023-10-27 DIAGNOSIS — Z5112 Encounter for antineoplastic immunotherapy: Secondary | ICD-10-CM | POA: Diagnosis not present

## 2023-10-27 MED ORDER — MORPHINE SULFATE (PF) 2 MG/ML IV SOLN
1.0000 mg | Freq: Once | INTRAVENOUS | Status: AC
  Administered 2023-10-27: 1 mg via INTRAVENOUS
  Filled 2023-10-27: qty 1

## 2023-10-27 MED ORDER — ACETAMINOPHEN 325 MG PO TABS
650.0000 mg | ORAL_TABLET | Freq: Once | ORAL | Status: AC
  Administered 2023-10-27: 650 mg via ORAL
  Filled 2023-10-27: qty 2

## 2023-10-27 MED ORDER — DIPHENHYDRAMINE HCL 25 MG PO CAPS
25.0000 mg | ORAL_CAPSULE | Freq: Once | ORAL | Status: AC
  Administered 2023-10-27: 25 mg via ORAL
  Filled 2023-10-27: qty 1

## 2023-10-27 MED ORDER — OXYCODONE HCL 5 MG PO TABS: 5.0000 mg | ORAL_TABLET | ORAL | 0 refills | Status: DC | PRN

## 2023-10-27 MED ORDER — SODIUM CHLORIDE 0.9% IV SOLUTION
250.0000 mL | INTRAVENOUS | Status: DC
  Administered 2023-10-27: 100 mL via INTRAVENOUS
  Filled 2023-10-27: qty 250

## 2023-10-27 NOTE — Progress Notes (Signed)
 Palliative Medicine [facility] Telephone:(336) [phone] Fax:(336) [phone]   Name: Jon Mejia Date: 10/27/2023 MRN: [id number]  DOB: 1940-09-09  Patient Care Team: Corwin Antu, FNP as PCP - General (Family Medicine) Perla, Evalene PARAS, MD as PCP - Cardiology (Cardiology) Portia Fireman, OD as Consulting Physician (Optometry) Perla, Evalene PARAS, MD as Consulting Physician (Cardiology) Maurie Rayfield BIRCH, RN as Oncology Nurse Navigator Jacobo, Evalene PARAS, MD as Consulting Physician (Oncology)    REASON FOR CONSULTATION: Jon Mejia is a 83 y.o. 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  Patient is being treated as a metastatic cholangiocarcinoma.  Palliative care was consulted to address goals and manage ongoing symptoms.  SOCIAL HISTORY:     reports that he quit smoking about 40 years ago. His smoking use included cigarettes. He started smoking about 60 years ago. He has a 20 pack-year smoking history. He has been exposed to tobacco smoke. He has never used smokeless tobacco. He reports that he does not drink alcohol and does not use drugs.  Patient is married and lives at home with his wife.  He retired from [employer].  ADVANCE DIRECTIVES:    CODE STATUS: DNR/DNI  PAST MEDICAL HISTORY: Past Medical History:  Diagnosis Date   Angina pectoris    Aortic atherosclerosis    Aortic stenosis    a.) TTE 03/14/2018: mod AS (MPG 14); b.) TTE 09/13/2019: mild AS (MPG 12.5); c.) TTE 08/16/2021: mod AS (MPG 18.5); d.) R/LHC 09/17/2021: mRA 3, mPA 19, mPCWP 11, LVEDP 15, CO 5.22, CI 2.75; e.) s/p TAVR 11/06/2021   CAD (coronary artery disease) 09/17/2021   a.) R/LHC 09/17/2021: 30% pRCA, 40% dRCA, 50% pLCx, 30% dLCx, 50% pLAD, 50% mLAD, 50% dLAD --> med mgmt   Cardiac murmur    Cervical spondylosis    Cholelithiasis    CRAO (central retinal  artery occlusion), 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   CVA (cerebral vascular accident) (HCC) 03/13/2018   a.) MRI brain 03/14/2018 --> acute infarct in the posterior RIGHT frontal lobe; small chronic cerebellar infarct --> Tx'd with TPA with (+) improvement/resolution of symptoms   Diastolic dysfunction    a.) TTE 01/31/2015: EF 55-60%, MAC, G1DD; b.) TTE 03/14/2018: EF 60-65%, mild LAE, sev MAC, sev AoV cal with mod AS (MPG 14) G2DD; c.) TTE 09/13/2019: EF 55-60%, mild LAE, mild-mod MR, mild AS (MPG 12.5), G2DD; d.) TTE 08/16/2021: EF 60-65%, mild LVH, mod MAC, mild MR, mod AS (MPG 18.5), G1DD; e.) TTE 12/05/2021: EF 60-65%, mod MAC, s/p TAVR (well seated/functioning valve), mild MR, G1DD   GERD (gastroesophageal reflux disease)    History of chicken pox    History of measles    Hyperlipidemia    Hypertension    LBBB (left bundle branch block)    Long term current use of anticoagulant    a.) apixaban    Low back pain radiating to left lower extremity    PAF (paroxysmal atrial fibrillation) (HCC)    a.) CHA2DS2VASc = 6 (age x2, HTN, CVA x2, vascular disease history);  b.) rate/rhythm maintained on oral metoprolol  succinate; chronically anticoagulated with apixaban    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   a.) 26mm S3UR via RIGHT TF approach   Skin cancer     PAST SURGICAL HISTORY:  Past Surgical History:  Procedure Laterality Date   COLONOSCOPY  09/26/2023   Procedure: COLONOSCOPY;  Surgeon: Onita Elspeth Sharper, DO;  Location: [facility]  ENDOSCOPY;  Service: Gastroenterology;;   ENTEROSCOPY N/A 09/28/2023   Procedure: ENTEROSCOPY;  Surgeon: Unk Corinn Skiff, MD;  Location: [facility] ENDOSCOPY;  Service: Gastroenterology;  Laterality: N/A;   ESOPHAGOGASTRODUODENOSCOPY N/A 09/26/2023   Procedure: EGD (ESOPHAGOGASTRODUODENOSCOPY);  Surgeon: Onita Elspeth Sharper, DO;  Location: [facility] ENDOSCOPY;  Service: Gastroenterology;  Laterality: N/A;   GIVENS CAPSULE STUDY  09/26/2023   Procedure:  IMAGING PROCEDURE, GI TRACT, INTRALUMINAL, VIA CAPSULE;  Surgeon: Onita Elspeth Sharper, DO;  Location: [facility] ENDOSCOPY;  Service: Gastroenterology;;   INTRAOPERATIVE TRANSTHORACIC ECHOCARDIOGRAM N/A 11/06/2021   Procedure: INTRAOPERATIVE TRANSTHORACIC ECHOCARDIOGRAM;  Surgeon: Verlin Lonni BIRCH, MD;  Location: [facility] OR;  Service: Open Heart Surgery;  Laterality: N/A;   IR IMAGING GUIDED PORT INSERTION  08/07/2023   RIGHT/LEFT HEART CATH AND CORONARY ANGIOGRAPHY N/A 09/17/2021   Procedure: RIGHT/LEFT HEART CATH AND CORONARY ANGIOGRAPHY;  Surgeon: Verlin Lonni BIRCH, MD;  Location: [facility] INVASIVE CV LAB;  Service: Cardiovascular;  Laterality: N/A;   ROBOTIC ASSISTED LAPAROSCOPIC CHOLECYSTECTOMY  07/24/2022   SKIN CANCER EXCISION  07/2020   TRANSCATHETER AORTIC VALVE REPLACEMENT, TRANSFEMORAL N/A 11/06/2021   Procedure: Transcatheter Aortic Valve Replacement, Transfemoral;  Surgeon: Verlin Lonni BIRCH, MD;  Location: [facility] OR;  Service: Open Heart Surgery;  Laterality: N/A;    HEMATOLOGY/ONCOLOGY HISTORY:  Oncology History  Peritoneal carcinomatosis (HCC)  07/30/2023 Initial Diagnosis   Peritoneal carcinomatosis (HCC)   08/12/2023 -  Chemotherapy   Patient is on Treatment Plan : BILIARY TRACT Cisplatin  + Gemcitabine  D1,8 + Durvalumab  (1500) D1 q21d / Durvalumab  (1500) q28d     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  MEDICATIONS:  Current Outpatient Medications  Medication Sig Dispense Refill   atorvastatin  (LIPITOR) 80 MG tablet Take 1 tablet (80 mg total) by mouth daily. 90 tablet 3   dexamethasone  (DECADRON ) 4 MG tablet Take 1 tablet (4 mg total) by mouth  daily. 30 tablet 3   lidocaine -prilocaine  (EMLA ) cream Apply to affected area once 30 g 3   metoprolol  succinate (TOPROL -XL) 25 MG 24 hr tablet TAKE ONE AND ONE-HALF TABLETS DAILY 45 tablet 3   ondansetron  (ZOFRAN ) 8 MG tablet Take 1 tablet (8 mg total) by mouth every 8 (eight) hours as needed for nausea or vomiting. Start on the third day after cisplatin . 60 tablet 2   oxyCODONE  (ROXICODONE ) 5 MG immediate release tablet Take 1 tablet (5 mg total) by mouth every 6 (six) hours as needed. 90 tablet 0   polyethylene glycol powder (GLYCOLAX /MIRALAX ) 17 GM/SCOOP powder Take 17 g by mouth as directed. 510 g 3   prochlorperazine  (COMPAZINE ) 10 MG tablet Take 1 tablet (10 mg total) by mouth every 6 (six) hours as needed (Nausea or vomiting). 60 tablet 2   senna (SENOKOT) 8.6 MG TABS tablet Take 2 tablets (17.2 mg total) by mouth 2 (two) times daily as needed for mild constipation or moderate constipation. 120 tablet 3   traZODone  (DESYREL ) 50 MG tablet TAKE 1 TABLET BY MOUTH AT BEDTIME AS NEEDED FOR SLEEP 90 tablet 0   No current facility-administered medications for this visit.   Facility-Administered Medications Ordered in Other Visits  Medication Dose Route Frequency Provider Last Rate Last Admin   0.9 %  sodium chloride  infusion (Manually program via Guardrails IV Fluids)  250 mL Intravenous Continuous Dasie Tinnie MATSU, NP 10 mL/hr at 10/27/23 0849 100 mL at 10/27/23 0849    VITAL SIGNS: There were no vitals taken for this visit. There were no vitals filed for this visit.  Estimated body mass index is 24.86 kg/m as calculated from the following:   Height as of 10/24/23: 5' 5 (1.651 m).   Weight as of 10/24/23: 149 lb 6.4 oz (67.8 kg).  LABS: CBC:    Component Value Date/Time   WBC 35.5 (H) 10/24/2023 0838   HGB 7.3 (L) 10/24/2023 0838   HGB 8.5 (L) 10/21/2023 0800   HGB 11.5 (L) 07/07/2023 0858   HCT 21.9 (L) 10/24/2023 0838   HCT 34.6 (L) 07/07/2023 0858   PLT 73 (L) 10/24/2023 0838    PLT 58 (L) 10/21/2023 0800   PLT 332 07/07/2023 0858   MCV 86.9 10/24/2023 0838   MCV 87 07/07/2023 0858   NEUTROABS 31.7 (H) 10/24/2023 0838   LYMPHSABS 1.2 10/24/2023 0838   MONOABS 1.7 (H) 10/24/2023 0838   EOSABS 0.0 10/24/2023 0838   BASOSABS 0.1 10/24/2023 0838   Comprehensive Metabolic Panel:    Component Value Date/Time   NA 135 10/24/2023 0838   NA 136 07/07/2023 0858   K 3.8 10/24/2023 0838   CL 104 10/24/2023 0838   CO2 23 10/24/2023 0838   BUN 31 (H) 10/24/2023 0838   BUN 15 07/07/2023 0858   CREATININE 1.01 10/24/2023 0838   CREATININE 0.90 10/21/2023 0800   CREATININE 1.10 01/09/2017 0906   GLUCOSE 95 10/24/2023 0838   CALCIUM  8.3 (L) 10/24/2023 0838   AST 46 (H) 10/24/2023 0838   AST 42 (H) 10/21/2023 0800   ALT 39 10/24/2023 0838   ALT 46 (H) 10/21/2023 0800   ALKPHOS 366 (H) 10/24/2023 0838   BILITOT 0.9 10/24/2023 0838   BILITOT 1.0 10/21/2023 0800   PROT 5.1 (L) 10/24/2023 0838   PROT 6.8 07/07/2023 0858   ALBUMIN 2.0 (L) 10/24/2023 0838   ALBUMIN 4.0 07/07/2023 0858    RADIOGRAPHIC STUDIES: CT ENTERO ABD/PELVIS W CONTAST Result Date: 09/29/2023 EXAM: CT ABDOMEN AND PELVIS WITH CONTRAST (ENTEROGRAPHY) 09/29/2023 11:04:29 AM TECHNIQUE: CT of the abdomen and pelvis was performed after the administration of intravenous contrast and negative oral contrast. Automated exposure control, iterative reconstruction, and/or weight based adjustment of the mA/kV was utilized to reduce the radiation dose to as low as reasonably achievable. COMPARISON: 07/10/2023 CT abdomen/pelvis, 09/28/2023 abdominal radiograph, and 07/15/2023 PET/CT. CLINICAL HISTORY: GI bleed. 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 FINDINGS: LOWER CHEST: Numerous (greater than 10) solid pulmonary nodules scattered throughout both lung bases, new since 07/10/2023 CT. Largest 0.9 cm in the right lower lobe on series 4 image 9 and 1.0 cm in the medial left upper lobe on image 2. LIVER:  Innumerable (greater than 20) hypodense liver masses scattered throughout the liver, increased in size and number. Representative 3.4 x 3.3 cm posterior right liver mass on series 2 image 24, increased from 1.6 x 1.5 cm. Representative 2.3 x 1.9 cm anterior left liver mass on image 22, increased from 1.0 x 0.9 cm. GALLBLADDER AND BILE DUCTS: Cholecystectomy. No biliary ductal dilatation. SPLEEN: No acute abnormality. PANCREAS: No acute abnormality. ADRENAL GLANDS: No acute abnormality. KIDNEYS, URETERS AND BLADDER: No stones in the kidneys or ureters. No hydronephrosis. No perinephric or periureteral stranding. Urinary bladder is unremarkable. STOMACH AND BOWEL: New irregular 3.9 x 2.6 cm  small bowel mass within a right abdominal small bowel loop on series 2 image 69. No dilated small bowel loops. Diminutive appendix. No large bowel wall thickening or diverticulosis. Metallic density 2.0 x 1.4 cm structure within the mid rectal lumen on series 5 image 84. No active intraluminal contrast extravasation. PERITONEUM AND RETROPERITONEUM: Omental soft tissue caking is substantially decreased with dominant residual 1.7 x 1.3 cm right omental nodule on series 2 image 54. New mesenteric soft tissue implants, largest 1.8 x 1.6 cm on image 67. Upper right pelvic sidewall 2.5 x 2.3 cm implant on image 74, increased from 1.1 x 0.9 cm. VASCULATURE: Atherosclerotic nonaneurysmal abdominal aorta. Superior approach central venous catheter tip seen at the cavoatrial junction. LYMPH NODES: Newly mildly enlarged 1.1 cm porta hepatis lymph node noted on series 2 image 33. REPRODUCTIVE ORGANS: Stable mild prostatomegaly. BONES AND SOFT TISSUES: Moderate thoracolumbar spondylosis. IMPRESSION: 1. New irregular 3.9 x 2.6 cm small bowel mass within a right abdominal small bowel loop compatible with small bowel metastasis, likely the source of GI bleeding. No active GI bleed on CT. 2. Interval progression of advanced liver metastatic disease.  3. Innumerable new lung metastases at the lung bases. 4. Mixed changes in the peritoneal metastatic disease, with decreased omental caking, but new/increased metastatic implants in the mesentery and right pelvic sidewall. 5. New metastatic adenopathy to the porta hepatis. Electronically signed by: Selinda Blue MD 09/29/2023 11:37 AM EDT RP Workstation: [id number]   DG Abd 1 View Result Date: 09/28/2023 CLINICAL DATA:  Nausea. EXAM: ABDOMEN - 1 VIEW COMPARISON:  Radiograph yesterday FINDINGS: Retained endoscopy capsule projects over the right pelvis, unchanged. No small bowel distension or evidence of obstruction. Mild gaseous distension of transverse colon. No significant formed colonic stool. TAVR partially included. Vascular calcifications are seen. IMPRESSION: 1. Retained endoscopy capsule projects over the right pelvis, unchanged. 2. Mild gaseous distension of transverse colon. No evidence of bowel obstruction. Electronically Signed   By: Andrea Gasman M.D.   On: 09/28/2023 23:45   DG Abd 1 View Result Date: 09/27/2023 EXAM: 1 VIEW XRAY OF THE ABDOMEN 09/27/2023 02:01:36 PM COMPARISON: None available. CLINICAL HISTORY: Pt had video capsule study yesterday, capsule did not reach cecum on images, rule out any retained capsule. FINDINGS: BOWEL: Nonobstructive bowel gas pattern. SOFT TISSUES: No opaque urinary calculi. Endoscopy capsule in right lower quadrant. TAVR and right chest power port noted. BONES: No acute osseous abnormality. Probable phleboliths in the pelvis. IMPRESSION: 1. Endoscopy capsule in the right lower quadrant, consistent with retained capsule. Electronically signed by: Rockey Kilts MD 09/27/2023 02:53 PM EDT RP Workstation: [id number]    PERFORMANCE STATUS (ECOG) : 2 - Symptomatic, <50% confined to bed  Review of Systems Unless otherwise noted, a complete review of systems is negative.  Physical Exam General: NAD Cardiovascular: regular rate and rhythm Pulmonary: clear ant  fields Abdomen: soft, nontender, + bowel sounds GU: no suprapubic tenderness Extremities: no edema, no joint deformities Skin: no rashes Neurological: Weakness but otherwise nonfocal  IMPRESSION: Follow-up visit.  Patient accompanied by daughter but wife participated in visit via phone.  Patient was hospitalized 09/25/2023 to 09/29/2023 with GI bleed.  EGD and colonoscopy did not reveal active bleeding.  CT showed interval progression of metastatic disease in the liver, along, peritoneum, and new small bowel mass, which was felt likely to be the source of his bleeding.  Patient has continued to receive chemotherapy with cisplatin , gemcitabine , and Durvalumab .    Patient was seen in [facility] on 10/24/2023  with note of worsening anemia.  He returns today for blood transfusion.  Patient and family report significant worsening of weakness and poor oral intake over the weekend.  Patient also endorses significant abdominal pain.  Discussed options with patient and family including hospitalization, although it seems highly likely that we would have limited interventions at this point.  Patient is likely having active bleeding from progressive small bowel mass/metastasis.  We also discussed option of transitioning to comfort care/hospice.  Patient and family seemed open to the idea of hospice but would like Dr. Sophie input.  Dr. Jacobo plans to see patient today.  Regarding pain, will liberalize dosing of oxycodone  to 1 to 2 tablets every 4 hours  PLAN: - Best supportive care - Referral to hospice - Increase oxycodone  1 to 2 tablets every 4 hours as needed #60 - Daily bowel regimen - DNR/DNI - Follow-up as needed  Case and plan discussed with Dr. Jacobo  Patient expressed understanding and was in agreement with this plan. He also understands that He can call the clinic at any time with any questions, concerns, or complaints.     Time Total: 25 minutes  Visit consisted of counseling and  education dealing with the complex and emotionally intense issues of symptom management and palliative care in the setting of serious and potentially life-threatening illness.Greater than 50%  of this time was spent counseling and coordinating care related to the above assessment and plan.  Signed by: Fonda Mower, PhD, NP-C

## 2023-10-27 NOTE — Progress Notes (Signed)
 Per Dr Jacobo, okay to d/c with BP 75/59.  Pt to be set up with hospice.

## 2023-10-27 NOTE — Patient Instructions (Signed)
 Blood Transfusion, Adult A blood transfusion is a procedure in which you receive blood through an IV tube. You may need this procedure because of: A bleeding disorder. An illness. An injury. A surgery. The blood may come from someone else (a donor). You may also be able to donate blood for yourself before a surgery. The blood given in a transfusion may be made up of different types of cells. You may get: Red blood cells. These carry oxygen to the cells in the body. Platelets. These help your blood to clot. Plasma. This is the liquid part of your blood. It carries proteins and other substances through the body. White blood cells. These help you fight infections. If you have a clotting disorder, you may also get other types of blood products. Depending on the type of blood product, this procedure may take 1-4 hours to complete. Tell your doctor about: Any bleeding problems you have. Any reactions you have had during a blood transfusion in the past. Any allergies you have. All medicines you are taking, including vitamins, herbs, eye drops, creams, and over-the-counter medicines. Any surgeries you have had. Any medical conditions you have. Whether you are pregnant or may be pregnant. What are the risks? Talk with your health care provider about risks. The most common problems include: A mild allergic reaction. This includes red, swollen areas of skin (hives) and itching. Fever or chills. This may be the body's response to new blood cells received. This may happen during or up to 4 hours after the transfusion. More serious problems may include: A serious allergic reaction. This includes breathing trouble or swelling around the face and lips. Too much fluid in the lungs. This may cause breathing problems. Lung injury. This causes breathing trouble and low oxygen in the blood. This can happen within hours of the transfusion or days later. Too much iron . This can happen after getting many blood  transfusions over a period of time. An infection or virus passed through the blood. This is rare. Donated blood is carefully tested before it is given. Your body's defense system (immune system) trying to attack the new blood cells. This is rare. Symptoms may include fever, chills, nausea, low blood pressure, and low back or chest pain. Donated cells attacking healthy tissues. This is rare. What happens before the procedure? You will have a blood test to find out your blood type. The test also finds out what type of blood your body will accept and matches it to the donor type. If you are going to have a planned surgery, you may be able to donate your own blood. This may be done in case you need a transfusion. You will have your temperature, blood pressure, and pulse checked. You may receive medicine to help prevent an allergic reaction. This may be done if you have had a reaction to a transfusion before. This medicine may be given to you by mouth or through an IV tube. What happens during the procedure?  An IV tube will be put into one of your veins. The bag of blood will be attached to your IV tube. Then, the blood will enter through your vein. Your temperature, blood pressure, and pulse will be checked often. This is done to find early signs of a transfusion reaction. Tell your nurse right away if you have any of these symptoms: Shortness of breath or trouble breathing. Chest or back pain. Fever or chills. Red, swollen areas of skin or itching. If you have any signs  or symptoms of a reaction, your transfusion will be stopped. You may also be given medicine. When the transfusion is finished, your IV tube will be taken out. Pressure may be put on the IV site for a few minutes. A bandage (dressing) will be put on the IV site. The procedure may vary among doctors and hospitals. What happens after the procedure? You will be monitored until you leave the hospital or clinic. This includes  checking your temperature, blood pressure, pulse, breathing rate, and blood oxygen level. Your blood may be tested to see how you have responded to the transfusion. You may be warmed with fluids or blankets. This is done to keep the temperature of your body normal. If you have your procedure in an outpatient setting, you will be told whom to contact to report any reactions. Where to find more information Visit the [employer]: redcross.org Summary A blood transfusion is a procedure in which you receive blood through an IV tube. The blood you are given may be made up of different blood cells. You may receive red blood cells, platelets, plasma, or white blood cells. Your temperature, blood pressure, and pulse will be checked often. After the procedure, your blood may be tested to see how you have responded. This information is not intended to replace advice given to you by your health care provider. Make sure you discuss any questions you have with your health care provider. Document Revised: 04/13/2021 Document Reviewed: 04/13/2021 Elsevier Patient Education  2024 [employer].

## 2023-10-27 NOTE — Progress Notes (Unsigned)
 MD to see patient in infusion.

## 2023-10-28 ENCOUNTER — Ambulatory Visit: Admitting: Medical

## 2023-10-28 ENCOUNTER — Ambulatory Visit: Admitting: Oncology

## 2023-10-28 ENCOUNTER — Ambulatory Visit

## 2023-10-28 ENCOUNTER — Other Ambulatory Visit

## 2023-10-28 ENCOUNTER — Inpatient Hospital Stay

## 2023-10-28 ENCOUNTER — Inpatient Hospital Stay: Admitting: Oncology

## 2023-10-28 ENCOUNTER — Encounter: Payer: Self-pay | Admitting: Nurse Practitioner

## 2023-10-28 LAB — TYPE AND SCREEN
ABO/RH(D): O POS
Antibody Screen: NEGATIVE
Unit division: 0

## 2023-10-28 LAB — BPAM RBC
Blood Product Expiration Date: 202510172359
ISSUE DATE / TIME: 202509290906
Unit Type and Rh: 5100

## 2023-10-28 NOTE — Progress Notes (Signed)
 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    INTERVAL HISTORY: Patient seen in clinic as an add-on.  His blood transfusion discussed treatment options and possible hospice.  Over this past weekend he has had a significant increase weakness and fatigue and a decrease in performance status.  Patient's daughter reports he has been unable to get out of bed and was very difficult to transport into clinic today.  He continues to have a poor appetite. He has no neurologic complaints.  He denies any recent fevers or illnesses.  He has no chest pain, shortness of breath, cough, or hemoptysis.  He does not complain of abdominal pain today.  He denies any nausea, vomiting, constipation, or diarrhea.  He has no melena or hematochezia today.  He has no urinary complaints.  Patient feels generally terrible, but offers no further specific complaints today.    REVIEW OF SYSTEMS:   Review of Systems  Constitutional:  Positive for malaise/fatigue. Negative for fever and weight loss.  Respiratory: Negative.  Negative for cough, hemoptysis and shortness of breath.   Cardiovascular: Negative.  Negative for chest pain and leg swelling.  Gastrointestinal: Negative.  Negative for abdominal pain, blood in stool, constipation, diarrhea, melena, nausea and vomiting.  Genitourinary: Negative.  Negative for dysuria.  Musculoskeletal: Negative.  Negative for back pain.  Skin:  Negative.  Negative for rash.  Neurological:  Positive for weakness. Negative for dizziness, focal weakness and headaches.  Psychiatric/Behavioral: Negative.  The patient is not nervous/anxious.     As per HPI. Otherwise, a complete review of systems is negative.  PAST MEDICAL HISTORY: Past Medical History:  Diagnosis Date   Angina pectoris    Aortic atherosclerosis    Aortic stenosis    a.) TTE 03/14/2018: mod AS (MPG 14); b.) TTE 09/13/2019: mild AS (MPG 12.5); c.) TTE 08/16/2021: mod AS (MPG 18.5); d.) R/LHC 09/17/2021: mRA 3, mPA 19, mPCWP 11, LVEDP 15, CO 5.22, CI 2.75; e.) s/p TAVR 11/06/2021   CAD (coronary artery disease) 09/17/2021   a.) R/LHC 09/17/2021: 30% pRCA, 40% dRCA, 50% pLCx, 30% dLCx, 50% pLAD, 50% mLAD, 50% dLAD --> med mgmt   Cardiac murmur    Cervical spondylosis    Cholelithiasis    CRAO (central retinal artery occlusion), 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   CVA (cerebral vascular accident) (HCC) 03/13/2018   a.) MRI brain 03/14/2018 --> acute infarct in the posterior RIGHT frontal lobe; small chronic cerebellar infarct --> Tx'd with TPA with (+) improvement/resolution of symptoms   Diastolic dysfunction    a.) TTE 01/31/2015: EF 55-60%, MAC, G1DD; b.) TTE 03/14/2018: EF 60-65%, mild LAE, sev MAC, sev AoV cal with mod AS (MPG 14) G2DD; c.) TTE 09/13/2019: EF 55-60%, mild LAE, mild-mod MR, mild AS (MPG 12.5), G2DD; d.) TTE 08/16/2021: EF 60-65%, mild LVH, mod MAC, mild MR, mod AS (MPG 18.5), G1DD; e.) TTE 12/05/2021: EF 60-65%, mod MAC, s/p TAVR (well seated/functioning valve), mild MR, G1DD   GERD (gastroesophageal reflux disease)    History of chicken pox    History  of measles    Hyperlipidemia    Hypertension    LBBB (left bundle branch block)    Long term current use of anticoagulant    a.) apixaban    Low back pain radiating to left lower extremity    PAF (paroxysmal atrial fibrillation) (HCC)    a.) CHA2DS2VASc = 6 (age x2, HTN, CVA x2, vascular disease history);  b.)  rate/rhythm maintained on oral metoprolol  succinate; chronically anticoagulated with apixaban    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   a.) 26mm S3UR via RIGHT TF approach   Skin cancer     PAST SURGICAL HISTORY: Past Surgical History:  Procedure Laterality Date   COLONOSCOPY  09/26/2023   Procedure: COLONOSCOPY;  Surgeon: Onita Elspeth Sharper, DO;  Location: [facility] ENDOSCOPY;  Service: Gastroenterology;;   ENTEROSCOPY N/A 09/28/2023   Procedure: ENTEROSCOPY;  Surgeon: Unk Corinn Skiff, MD;  Location: [facility] ENDOSCOPY;  Service: Gastroenterology;  Laterality: N/A;   ESOPHAGOGASTRODUODENOSCOPY N/A 09/26/2023   Procedure: EGD (ESOPHAGOGASTRODUODENOSCOPY);  Surgeon: Onita Elspeth Sharper, DO;  Location: [facility] ENDOSCOPY;  Service: Gastroenterology;  Laterality: N/A;   GIVENS CAPSULE STUDY  09/26/2023   Procedure: IMAGING PROCEDURE, GI TRACT, INTRALUMINAL, VIA CAPSULE;  Surgeon: Onita Elspeth Sharper, DO;  Location: [facility] ENDOSCOPY;  Service: Gastroenterology;;   INTRAOPERATIVE TRANSTHORACIC ECHOCARDIOGRAM N/A 11/06/2021   Procedure: INTRAOPERATIVE TRANSTHORACIC ECHOCARDIOGRAM;  Surgeon: Verlin Lonni BIRCH, MD;  Location: [facility] OR;  Service: Open Heart Surgery;  Laterality: N/A;   IR IMAGING GUIDED PORT INSERTION  08/07/2023   RIGHT/LEFT HEART CATH AND CORONARY ANGIOGRAPHY N/A 09/17/2021   Procedure: RIGHT/LEFT HEART CATH AND CORONARY ANGIOGRAPHY;  Surgeon: Verlin Lonni BIRCH, MD;  Location: [facility] INVASIVE CV LAB;  Service: Cardiovascular;  Laterality: N/A;   ROBOTIC ASSISTED LAPAROSCOPIC CHOLECYSTECTOMY  07/24/2022   SKIN CANCER EXCISION  07/2020   TRANSCATHETER AORTIC VALVE REPLACEMENT, TRANSFEMORAL N/A 11/06/2021   Procedure: Transcatheter Aortic Valve Replacement, Transfemoral;  Surgeon: Verlin Lonni BIRCH, MD;  Location: [facility] OR;  Service: Open Heart Surgery;  Laterality: N/A;    FAMILY HISTORY: Family History  Problem Relation Age of Onset   Arrhythmia Mother         A-Fib   Lung cancer Father    Diabetes Son    Colon cancer Neg Hx    Prostate cancer Neg Hx     ADVANCED DIRECTIVES (Y/N):  N  HEALTH MAINTENANCE: Social History   Tobacco Use   Smoking status: Former    Current packs/day: 0.00    Average packs/day: 1 pack/day for 20.0 years (20.0 ttl pk-yrs)    Types: Cigarettes    Start date: 03/12/1963    Quit date: 03/12/1983    Years since quitting: 40.6    Passive exposure: Past   Smokeless tobacco: Never  Vaping Use   Vaping status: Never Used  Substance Use Topics   Alcohol use: No   Drug use: No     Colonoscopy:  PAP:  Bone density:  Lipid panel:  Allergies  Allergen Reactions   Latex Rash    Current Outpatient Medications  Medication Sig Dispense Refill   atorvastatin  (LIPITOR) 80 MG tablet Take 1 tablet (80 mg total) by mouth daily. 90 tablet 3   dexamethasone  (DECADRON ) 4 MG tablet Take 1 tablet (4 mg total) by mouth daily. 30 tablet 3   lidocaine -prilocaine  (EMLA ) cream Apply to affected area once 30 g 3   metoprolol  succinate (TOPROL -XL) 25 MG 24 hr tablet TAKE ONE AND ONE-HALF TABLETS DAILY 45 tablet 3   ondansetron  (  ZOFRAN ) 8 MG tablet Take 1 tablet (8 mg total) by mouth every 8 (eight) hours as needed for nausea or vomiting. Start on the third day after cisplatin . 60 tablet 2   oxyCODONE  (ROXICODONE ) 5 MG immediate release tablet Take 1-2 tablets (5-10 mg total) by mouth every 4 (four) hours as needed. 60 tablet 0   polyethylene glycol powder (GLYCOLAX /MIRALAX ) 17 GM/SCOOP powder Take 17 g by mouth as directed. 510 g 3   prochlorperazine  (COMPAZINE ) 10 MG tablet Take 1 tablet (10 mg total) by mouth every 6 (six) hours as needed (Nausea or vomiting). 60 tablet 2   senna (SENOKOT) 8.6 MG TABS tablet Take 2 tablets (17.2 mg total) by mouth 2 (two) times daily as needed for mild constipation or moderate constipation. 120 tablet 3   traZODone  (DESYREL ) 50 MG tablet TAKE 1 TABLET BY MOUTH AT BEDTIME AS NEEDED FOR  SLEEP 90 tablet 0   No current facility-administered medications for this visit.    OBJECTIVE: There were no vitals filed for this visit.    There is no height or weight on file to calculate BMI.    ECOG FS:3 - Symptomatic, >50% confined to bed  General: Well-developed, well-nourished, no acute distress. Eyes: Pink conjunctiva, anicteric sclera. HEENT: Normocephalic, moist mucous membranes. Lungs: No audible wheezing or coughing. Heart: Regular rate and rhythm. Abdomen: Soft, nontender, no obvious distention. Musculoskeletal: No edema, cyanosis, or clubbing. Neuro: Alert, answering all questions appropriately. Cranial nerves grossly intact. Skin: No rashes or petechiae noted. Psych: Normal affect.  LAB RESULTS:  Lab Results  Component Value Date   NA 135 10/24/2023   K 3.8 10/24/2023   CL 104 10/24/2023   CO2 23 10/24/2023   GLUCOSE 95 10/24/2023   BUN 31 (H) 10/24/2023   CREATININE 1.01 10/24/2023   CALCIUM  8.3 (L) 10/24/2023   PROT 5.1 (L) 10/24/2023   ALBUMIN 2.0 (L) 10/24/2023   AST 46 (H) 10/24/2023   ALT 39 10/24/2023   ALKPHOS 366 (H) 10/24/2023   BILITOT 0.9 10/24/2023   GFRNONAA >60 10/24/2023   GFRAA 81 08/25/2019    Lab Results  Component Value Date   WBC 35.5 (H) 10/24/2023   NEUTROABS 31.7 (H) 10/24/2023   HGB 7.3 (L) 10/24/2023   HCT 21.9 (L) 10/24/2023   MCV 86.9 10/24/2023   PLT 73 (L) 10/24/2023     STUDIES: CT ENTERO ABD/PELVIS W CONTAST Result Date: 09/29/2023 EXAM: CT ABDOMEN AND PELVIS WITH CONTRAST (ENTEROGRAPHY) 09/29/2023 11:04:29 AM TECHNIQUE: CT of the abdomen and pelvis was performed after the administration of intravenous contrast and negative oral contrast. Automated exposure control, iterative reconstruction, and/or weight based adjustment of the mA/kV was utilized to reduce the radiation dose to as low as reasonably achievable. COMPARISON: 07/10/2023 CT abdomen/pelvis, 09/28/2023 abdominal radiograph, and 07/15/2023 PET/CT. CLINICAL  HISTORY: GI bleed. 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 FINDINGS: LOWER CHEST: Numerous (greater than 10) solid pulmonary nodules scattered throughout both lung bases, new since 07/10/2023 CT. Largest 0.9 cm in the right lower lobe on series 4 image 9 and 1.0 cm in the medial left upper lobe on image 2. LIVER: Innumerable (greater than 20) hypodense liver masses scattered throughout the liver, increased in size and number. Representative 3.4 x 3.3 cm posterior right liver mass on series 2 image 24, increased from 1.6 x 1.5 cm. Representative 2.3 x 1.9 cm anterior left liver mass on image 22, increased from 1.0 x 0.9 cm. GALLBLADDER AND BILE DUCTS: Cholecystectomy. No biliary ductal  dilatation. SPLEEN: No acute abnormality. PANCREAS: No acute abnormality. ADRENAL GLANDS: No acute abnormality. KIDNEYS, URETERS AND BLADDER: No stones in the kidneys or ureters. No hydronephrosis. No perinephric or periureteral stranding. Urinary bladder is unremarkable. STOMACH AND BOWEL: New irregular 3.9 x 2.6 cm small bowel mass within a right abdominal small bowel loop on series 2 image 69. No dilated small bowel loops. Diminutive appendix. No large bowel wall thickening or diverticulosis. Metallic density 2.0 x 1.4 cm structure within the mid rectal lumen on series 5 image 84. No active intraluminal contrast extravasation. PERITONEUM AND RETROPERITONEUM: Omental soft tissue caking is substantially decreased with dominant residual 1.7 x 1.3 cm right omental nodule on series 2 image 54. New mesenteric soft tissue implants, largest 1.8 x 1.6 cm on image 67. Upper right pelvic sidewall 2.5 x 2.3 cm implant on image 74, increased from 1.1 x 0.9 cm. VASCULATURE: Atherosclerotic nonaneurysmal abdominal aorta. Superior approach central venous catheter tip seen at the cavoatrial junction. LYMPH NODES: Newly mildly enlarged 1.1 cm porta hepatis lymph node noted on series 2 image 33. REPRODUCTIVE ORGANS: Stable mild  prostatomegaly. BONES AND SOFT TISSUES: Moderate thoracolumbar spondylosis. IMPRESSION: 1. New irregular 3.9 x 2.6 cm small bowel mass within a right abdominal small bowel loop compatible with small bowel metastasis, likely the source of GI bleeding. No active GI bleed on CT. 2. Interval progression of advanced liver metastatic disease. 3. Innumerable new lung metastases at the lung bases. 4. Mixed changes in the peritoneal metastatic disease, with decreased omental caking, but new/increased metastatic implants in the mesentery and right pelvic sidewall. 5. New metastatic adenopathy to the porta hepatis. Electronically signed by: Selinda Blue MD 09/29/2023 11:37 AM EDT RP Workstation: [id number]   DG Abd 1 View Result Date: 09/28/2023 CLINICAL DATA:  Nausea. EXAM: ABDOMEN - 1 VIEW COMPARISON:  Radiograph yesterday FINDINGS: Retained endoscopy capsule projects over the right pelvis, unchanged. No small bowel distension or evidence of obstruction. Mild gaseous distension of transverse colon. No significant formed colonic stool. TAVR partially included. Vascular calcifications are seen. IMPRESSION: 1. Retained endoscopy capsule projects over the right pelvis, unchanged. 2. Mild gaseous distension of transverse colon. No evidence of bowel obstruction. Electronically Signed   By: Andrea Gasman M.D.   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  Previous biopsy revealed a poorly differentiated carcinoma consistent with either upper GI or pancreatobiliary origin.  Tumor markers are negative.  NGS testing suggested tissue of origin is possibly cholangiocarcinoma (82%) as well as a high tumor mutational burden indicating that immunotherapy would be of benefit.  Since his last treatment, patient's  performance status has declined and over the past several days has been significantly worse.  After lengthy discussion, patient and family agreed that any further treatment will likely not be beneficial and have agreed to hospice care.  No further follow-up has been scheduled.   Poor appetite/weight loss: Improved with daily dexamethasone .  Hospice as above. Abdominal pain: Patient does not complain of this today.  Continue oxycodone  as prescribed. Anemia: Hemoglobin has trended down despite transfusion.  Likely secondary to persistent GI bleed from tumor invasion.  Eliquis  has been discontinued.   Leukocytosis: Likely secondary to steroid use.  Monitor.   Thrombocytopenia: Mildly improved.  No further treatment as above.    I spent  a total of 30 minutes reviewing chart data, face-to-face evaluation with the patient, counseling and coordination of care as detailed above.    Patient expressed understanding and was in agreement with this plan. He also understands that He can call clinic at any time with any questions, concerns, or complaints.    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

## 2023-10-29 ENCOUNTER — Inpatient Hospital Stay

## 2023-10-29 ENCOUNTER — Other Ambulatory Visit

## 2023-10-29 ENCOUNTER — Ambulatory Visit: Admitting: Oncology

## 2023-10-29 ENCOUNTER — Ambulatory Visit

## 2023-10-30 ENCOUNTER — Encounter: Admitting: Hospice and Palliative Medicine

## 2023-10-30 ENCOUNTER — Ambulatory Visit

## 2023-10-30 ENCOUNTER — Other Ambulatory Visit

## 2023-10-30 ENCOUNTER — Ambulatory Visit: Admitting: Oncology

## 2023-10-30 ENCOUNTER — Inpatient Hospital Stay

## 2023-10-30 ENCOUNTER — Encounter

## 2023-10-31 ENCOUNTER — Ambulatory Visit: Admitting: Family

## 2023-11-04 ENCOUNTER — Encounter: Admitting: Hospice and Palliative Medicine

## 2023-11-04 ENCOUNTER — Ambulatory Visit: Admitting: Oncology

## 2023-11-04 ENCOUNTER — Other Ambulatory Visit

## 2023-11-04 ENCOUNTER — Encounter

## 2023-11-04 ENCOUNTER — Ambulatory Visit

## 2023-11-06 ENCOUNTER — Other Ambulatory Visit

## 2023-11-06 ENCOUNTER — Ambulatory Visit

## 2023-11-06 ENCOUNTER — Ambulatory Visit: Admitting: Oncology

## 2023-11-07 ENCOUNTER — Other Ambulatory Visit: Payer: Self-pay | Admitting: Hospice and Palliative Medicine

## 2023-11-07 MED ORDER — LORAZEPAM 0.5 MG PO TABS: 0.5000 mg | ORAL_TABLET | ORAL | 0 refills | Status: DC | PRN

## 2023-11-07 MED ORDER — MORPHINE SULFATE (CONCENTRATE) 10 MG /0.5 ML PO SOLN: 5.0000 mg | ORAL | 0 refills | Status: DC | PRN

## 2023-11-10 ENCOUNTER — Other Ambulatory Visit: Payer: Self-pay | Admitting: Hospice and Palliative Medicine

## 2023-11-10 MED ORDER — MORPHINE SULFATE (CONCENTRATE) 10 MG /0.5 ML PO SOLN: 10.0000 mg | ORAL | 0 refills | Status: DC | PRN

## 2023-11-10 NOTE — Progress Notes (Signed)
 Discussed with hospice RN, Amy. Patient reportedly active in dying process. Comfortable but family are administering morphine  frequently. Will refill morphine .

## 2023-11-11 ENCOUNTER — Ambulatory Visit

## 2023-11-11 ENCOUNTER — Ambulatory Visit: Admitting: Oncology

## 2023-11-11 ENCOUNTER — Other Ambulatory Visit

## 2023-11-13 ENCOUNTER — Ambulatory Visit

## 2023-11-18 ENCOUNTER — Ambulatory Visit

## 2023-11-18 ENCOUNTER — Ambulatory Visit: Admitting: Oncology

## 2023-11-18 ENCOUNTER — Other Ambulatory Visit

## 2023-11-20 ENCOUNTER — Ambulatory Visit

## 2023-11-20 ENCOUNTER — Ambulatory Visit: Admitting: Oncology

## 2023-11-20 ENCOUNTER — Other Ambulatory Visit

## 2023-11-25 ENCOUNTER — Ambulatory Visit

## 2023-11-25 ENCOUNTER — Other Ambulatory Visit

## 2023-11-25 ENCOUNTER — Ambulatory Visit: Admitting: Oncology

## 2023-11-27 ENCOUNTER — Ambulatory Visit

## 2023-11-27 ENCOUNTER — Other Ambulatory Visit

## 2023-11-27 ENCOUNTER — Ambulatory Visit: Admitting: Oncology

## 2023-11-29 DEATH — deceased

## 2023-12-02 ENCOUNTER — Other Ambulatory Visit

## 2023-12-02 ENCOUNTER — Ambulatory Visit: Admitting: Oncology

## 2023-12-02 ENCOUNTER — Ambulatory Visit

## 2023-12-04 ENCOUNTER — Ambulatory Visit

## 2023-12-09 ENCOUNTER — Ambulatory Visit: Admitting: Oncology

## 2023-12-09 ENCOUNTER — Ambulatory Visit

## 2023-12-09 ENCOUNTER — Other Ambulatory Visit

## 2023-12-11 ENCOUNTER — Ambulatory Visit: Admitting: Oncology

## 2023-12-11 ENCOUNTER — Ambulatory Visit

## 2023-12-11 ENCOUNTER — Other Ambulatory Visit

## 2023-12-16 ENCOUNTER — Ambulatory Visit

## 2023-12-16 ENCOUNTER — Other Ambulatory Visit

## 2023-12-16 ENCOUNTER — Ambulatory Visit: Admitting: Oncology

## 2023-12-18 ENCOUNTER — Other Ambulatory Visit

## 2023-12-18 ENCOUNTER — Ambulatory Visit

## 2023-12-18 ENCOUNTER — Ambulatory Visit: Admitting: Oncology

## 2023-12-23 ENCOUNTER — Ambulatory Visit: Admitting: Oncology

## 2023-12-23 ENCOUNTER — Other Ambulatory Visit

## 2023-12-23 ENCOUNTER — Ambulatory Visit

## 2023-12-30 ENCOUNTER — Ambulatory Visit

## 2023-12-30 ENCOUNTER — Ambulatory Visit: Admitting: Oncology

## 2023-12-30 ENCOUNTER — Other Ambulatory Visit

## 2024-01-01 ENCOUNTER — Ambulatory Visit

## 2024-01-05 ENCOUNTER — Encounter: Admitting: Family Medicine

## 2024-01-06 ENCOUNTER — Ambulatory Visit

## 2024-01-06 ENCOUNTER — Other Ambulatory Visit

## 2024-01-06 ENCOUNTER — Ambulatory Visit: Admitting: Oncology

## 2024-01-13 ENCOUNTER — Ambulatory Visit

## 2024-01-13 ENCOUNTER — Ambulatory Visit: Admitting: Oncology

## 2024-01-13 ENCOUNTER — Other Ambulatory Visit

## 2024-01-15 ENCOUNTER — Ambulatory Visit
